# Patient Record
Sex: Female | Born: 1940 | Race: Black or African American | Hispanic: No | Smoking: Never smoker
Health system: Southern US, Community
[De-identification: ages and names within clinical notes are randomized; demographics above are authoritative.]

## PROBLEM LIST (undated history)

## (undated) DIAGNOSIS — M109 Gout, unspecified: Secondary | ICD-10-CM

## (undated) DIAGNOSIS — I4891 Unspecified atrial fibrillation: Secondary | ICD-10-CM

## (undated) DIAGNOSIS — E785 Hyperlipidemia, unspecified: Secondary | ICD-10-CM

## (undated) DIAGNOSIS — T4145XA Adverse effect of unspecified anesthetic, initial encounter: Secondary | ICD-10-CM

## (undated) DIAGNOSIS — N390 Urinary tract infection, site not specified: Secondary | ICD-10-CM

## (undated) DIAGNOSIS — R569 Unspecified convulsions: Secondary | ICD-10-CM

## (undated) DIAGNOSIS — R7302 Impaired glucose tolerance (oral): Secondary | ICD-10-CM

## (undated) DIAGNOSIS — K219 Gastro-esophageal reflux disease without esophagitis: Secondary | ICD-10-CM

## (undated) DIAGNOSIS — T8859XA Other complications of anesthesia, initial encounter: Secondary | ICD-10-CM

## (undated) DIAGNOSIS — M1990 Unspecified osteoarthritis, unspecified site: Secondary | ICD-10-CM

## (undated) DIAGNOSIS — F0390 Unspecified dementia without behavioral disturbance: Secondary | ICD-10-CM

## (undated) DIAGNOSIS — N824 Other female intestinal-genital tract fistulae: Secondary | ICD-10-CM

## (undated) DIAGNOSIS — I639 Cerebral infarction, unspecified: Secondary | ICD-10-CM

## (undated) HISTORY — PX: CHOLECYSTECTOMY: SHX55

## (undated) HISTORY — PX: BUNIONECTOMY: SHX129

---

## 1996-11-19 HISTORY — PX: OTHER SURGICAL HISTORY: SHX169

## 2005-11-19 HISTORY — PX: OTHER SURGICAL HISTORY: SHX169

## 2006-01-15 ENCOUNTER — Emergency Department: Payer: Self-pay | Admitting: Emergency Medicine

## 2006-07-13 ENCOUNTER — Emergency Department: Payer: Self-pay | Admitting: Emergency Medicine

## 2006-11-02 ENCOUNTER — Observation Stay: Payer: Self-pay | Admitting: Specialist

## 2006-11-15 ENCOUNTER — Encounter: Payer: Self-pay | Admitting: Internal Medicine

## 2006-11-19 ENCOUNTER — Encounter: Payer: Self-pay | Admitting: Internal Medicine

## 2006-12-20 ENCOUNTER — Encounter: Payer: Self-pay | Admitting: Internal Medicine

## 2007-01-18 ENCOUNTER — Encounter: Payer: Self-pay | Admitting: Internal Medicine

## 2009-02-15 ENCOUNTER — Encounter: Payer: Self-pay | Admitting: Physician Assistant

## 2009-02-17 ENCOUNTER — Encounter: Payer: Self-pay | Admitting: Physician Assistant

## 2009-03-19 ENCOUNTER — Encounter: Payer: Self-pay | Admitting: Physician Assistant

## 2009-04-19 ENCOUNTER — Encounter: Payer: Self-pay | Admitting: Physician Assistant

## 2009-05-10 ENCOUNTER — Observation Stay (HOSPITAL_COMMUNITY): Admission: EM | Admit: 2009-05-10 | Discharge: 2009-05-11 | Payer: Self-pay | Admitting: Emergency Medicine

## 2009-05-10 ENCOUNTER — Ambulatory Visit: Payer: Self-pay | Admitting: Family Medicine

## 2011-02-26 LAB — URINALYSIS, ROUTINE W REFLEX MICROSCOPIC
Bilirubin Urine: NEGATIVE
Hgb urine dipstick: NEGATIVE
Specific Gravity, Urine: 1.022 (ref 1.005–1.030)
Urobilinogen, UA: 1 mg/dL (ref 0.0–1.0)
pH: 6.5 (ref 5.0–8.0)

## 2011-02-26 LAB — DIFFERENTIAL
Basophils Absolute: 0 10*3/uL (ref 0.0–0.1)
Eosinophils Relative: 2 % (ref 0–5)
Lymphocytes Relative: 40 % (ref 12–46)
Monocytes Absolute: 0.4 10*3/uL (ref 0.1–1.0)

## 2011-02-26 LAB — LIPID PANEL
Total CHOL/HDL Ratio: 2.5 RATIO
Triglycerides: 57 mg/dL (ref <150)
VLDL: 11 mg/dL (ref 0–40)

## 2011-02-26 LAB — URINE MICROSCOPIC-ADD ON

## 2011-02-26 LAB — CBC
HCT: 43.1 % (ref 36.0–46.0)
HCT: 46.9 % — ABNORMAL HIGH (ref 36.0–46.0)
Hemoglobin: 14.1 g/dL (ref 12.0–15.0)
Hemoglobin: 15.5 g/dL — ABNORMAL HIGH (ref 12.0–15.0)
MCHC: 33 g/dL (ref 30.0–36.0)
MCV: 92.6 fL (ref 78.0–100.0)
RBC: 4.67 MIL/uL (ref 3.87–5.11)
RDW: 13.4 % (ref 11.5–15.5)
RDW: 13.7 % (ref 11.5–15.5)

## 2011-02-26 LAB — POCT I-STAT, CHEM 8
Creatinine, Ser: 0.9 mg/dL (ref 0.4–1.2)
Glucose, Bld: 122 mg/dL — ABNORMAL HIGH (ref 70–99)
Hemoglobin: 16.3 g/dL — ABNORMAL HIGH (ref 12.0–15.0)
Potassium: 3.9 mEq/L (ref 3.5–5.1)

## 2011-02-26 LAB — POCT CARDIAC MARKERS
CKMB, poc: <1 ng/mL — ABNORMAL LOW (ref 1.0–8.0)
Troponin i, poc: <0.05 ng/mL (ref 0.00–0.09)

## 2011-02-26 LAB — COMPREHENSIVE METABOLIC PANEL
Albumin: 3.3 g/dL — ABNORMAL LOW (ref 3.5–5.2)
BUN: 14 mg/dL (ref 6–23)
Creatinine, Ser: 0.85 mg/dL (ref 0.4–1.2)
Total Protein: 7.2 g/dL (ref 6.0–8.3)

## 2011-02-26 LAB — BASIC METABOLIC PANEL
CO2: 25 mEq/L (ref 19–32)
GFR calc Af Amer: >60 mL/min (ref >60)
GFR calc non Af Amer: >60 mL/min (ref >60)
Glucose, Bld: 97 mg/dL (ref 70–99)
Potassium: 3.5 mEq/L (ref 3.5–5.1)
Sodium: 138 mEq/L (ref 135–145)

## 2011-02-26 LAB — CK TOTAL AND CKMB (NOT AT ARMC)
CK, MB: 1.3 ng/mL (ref 0.3–4.0)
Relative Index: INVALID (ref 0.0–2.5)
Total CK: 69 U/L (ref 7–177)

## 2011-02-26 LAB — PROTIME-INR: Prothrombin Time: 23.2 seconds — ABNORMAL HIGH (ref 11.6–15.2)

## 2011-02-26 LAB — URINE CULTURE

## 2011-04-03 NOTE — H&P (Signed)
NAMEVALLEY, KE NO.:  [id number]   MEDICAL RECORD NO.:  [id number]          PATIENT TYPE:  OBV   LOCATION:  3703                         FACILITY:  [facility]   PHYSICIAN:  Paula Compton, MD        DATE OF BIRTH:  06-30-1941   DATE OF ADMISSION:  05/10/2009  DATE OF DISCHARGE:                              HISTORY & PHYSICAL   PRIMARY CARE PHYSICIAN:  Dr. Tracey Harries at [facility].   PRIMARY CARDIOLOGIST:  Dr. Ardyth Harps at [facility].   CHIEF COMPLAINT:  Altered mental status.   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  She lives with her son in [place] who  last saw the patient last evening approximately midnight and she was at  her baseline.  He went to wake her this morning and found her in her bed  on her back, the sheets were disheveled, and she had bright red blood on  her left lip.  She was stunned appearing.  She is moving all  extremities but would make eye contact or follow commands.  He called  911, and by the time she arrived at the hospital via the ambulance the  son reports she was back to her normal neurologic baseline.  She was  incontinent of urine at the time when he found her.  Currently, she does  not remember the event and is without complaints.  The last thing she  remembers is waking up at the hospital.  She was on aspirin 325 mg for  her AFib at the time of her stroke in January but was then changed to  Coumadin and aspirin.  She has been staying with her sisters over the  past few days while her son was out of town at a funeral and had a  fairly liberal diet with a subsequent INR today of 1.8.  She denies  chest pain, shortness of breath, altered mental status now.  The  son is  very attentive to her and very helpful with her medications and diet.  Her cardiologist whom she sees routinely is Dr. Ardyth Harps at [facility] and  her last visit was May 2010 and all was well per the son.  She is able  to walk at baseline with a cane and do all of her ADLs including driving  although she has not returned to that since her stroke in January per  the son.  She passed a swallow study in the emergency department without  problems.   REVIEW OF SYSTEMS:  Currently as above.  She has no altered mental  status or confusion.  No chest pain.  No shortness of breath, dizziness,  or pain.  She denies any changes in her bowels and no dysuria.  The only  thing she endorses is some very mild fatigue  today.   PAST MEDICAL HISTORY:  1. CVA in January 2010 with very mild residual left-sided weakness and      left facial droop but able to do all of her ADLs and walk.  2. Atrial fibrillation, currently on Coumadin, aspirin, and a beta-      blocker.  3. Depression.  4. Hyperlipidemia.  5. Gastroesophageal reflux disease.  6. Chronic leg edema.  7. Obesity.  8. Questionable gout, although the son does not think she has this.  9. 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   PAST SURGICAL HISTORY:  1. Right hip replacement in 1999 and then revised in 2007 after a      right femur fracture.  2. Cholecystectomy.  3. Hernia repair.   ALLERGIES:  No known drug allergies.   MEDICATIONS:  1. Coumadin 4 mg Monday, Wednesday, and Friday, and 3 mg other days.  2. Metoprolol tartrate 50 mg p.o. daily.  3. Colchicine 0.6 mg p.o. daily.  4. KCl 10 mEq p.o. daily  5. Lasix 20 mg p.o. daily.  6. Sertraline 25 mg p.o. daily.  7. Simvastatin 40 mg p.o. daily.  8. Nexium 40 mg p.o. daily.  9. Calcium citrate daily  10.Aspirin 81 mg p.o. daily.   SOCIAL HISTORY:  She lives in [place] with her son who is very  attentive.  She used to work in mills but retired with disability due to  her  chronic osteoarthritis versus rheumatoid arthritis.  She denies  alcohol, tobacco, or drugs.   FAMILY HISTORY:  Mother had seizures after her CVA, but no other  seizures disorders in the family and no other major cancers.   PHYSICAL EXAMINATION:  VITAL SIGNS:  Temperature 98.1, heart rate 89-93  and irregular, blood pressure 131/84, respirations 19-20.  She is 97-99%  on 2 L nasal cannula.  GENERAL:  She is alert and oriented x3, pleasant, follows commands, no  apparent distress, morbidly obese.  HEENT:  Atraumatic, normocephalic except for a swollen right lower lip  and right-sided tongue bruise with a very faint left-sided droop and  otherwise face is symmetric.  NECK:  Supple.  Full range of motion.  No bruits.  PULMONARY:  Clear to auscultation bilaterally.  No wheezes, rhonchi, or  crackles.  No work of breathing.  CARDIOVASCULAR:  Irregularly irregular with no murmur.  ABDOMEN:  Obese, positive bowel sounds, soft, nontender, nondistended  EXTREMITIES:  No edema.  GU:  Foley is currently in place.  NEUROLOGIC:  Alert and oriented x3.  Cranial nerves II through XII  intact except for very mild left facial droop, strength is normal to me  in all of her extremities and equal bilaterally.  She has 2+ reflexes in  brachioradialis and patellofemoral and her Achilles,  SKIN:  No decubitus ulcers or rashes.   LABORATORY EXAMS AND IMAGING:  Hemoglobin 15.5, hematocrit 46.9,  platelet 185.  White blood cells 5.2.  BMET is within normal limits  including creatinine of 0.9, INR is 1.8, PT is 21.8.  Point-of-care  enzymes are negative x1.  Urinalysis is negative except for 100 of  protein with a specific gravity of 1.022.  Imaging shows a chest x-ray  with cardiomegaly and pulmonary vascular congestion but no frank edema  and a very small left pleural effusion and basilar atelectasis.  Head CT  shows remote right MCV infarct and no acute findings.   ASSESSMENT AND PLAN:  A 70 year old  obese African American female with  known atrial fibrillation and mildly  subtherapeutic INR at 1.8, history  of cerebrovascular accident, and hyperlipidemia, now status post altered  mental status has resolved.  1. Altered mental status.  Differential diagnoses includes seizure,      transient ischemic attack, and urinary tract infection.  Seizure is      likely given the history but cannot know for sure.  It could be a      transient ischemic attack given the decreased INR and her history      of atrial fibrillation.  Urinalysis is clean, but we will send it      for culture.  We will admit her for observations on telemetry floor      with neuro checks and seizure precautions.  She passed a swallow      study in the emergency room and her head CT was negative and the      son is declining any PT evaluation as he claims she is at baseline      and has had plenty of PT.  No further imaging needed at this time      unless neuro exam changes.  We will get her INR to therapeutic,      check a CMET and a fasting lipid panel.  No further workup at this      time as she has likely had a large workup at [facility] and does not      currently need to be repeated assuming her neuro status today is at      baseline.  Assuming no changes here, we will have her followup with      her PCP within the week for further management.  2. Atrial fibrillation.  This is rate controlled on Toprol.  Continue      her on Coumadin as above.  3. Hyperlipidemia.  Fasting lipid panel and continue simvastatin.  4. Depression.  Continue sertraline.  This is a very small dose, and      we will defer to the PCP for titration.  5. Questionable gout.  Son would like to discontinue the colchicine      and I think this is appropriate, so we will discontinue that this      hospitalization.  6. Chronic edema.  Continue her Lasix and KCL.  Likely, she has had an      echo in the past.  The son does not know her ejection fraction at       the time.  We will defer this to her cardiologist at [facility] for      further workup.  Has no pulmonary issues or edema currently.  7. Fluids, electrolytes, nutrition/gastrointestinal.  She passed her      swallow study in the emergency department, however, has evaluated      without port per the son's request and Hep-Lock her IV.  8. Prophylaxis on Coumadin and PPI.  9. Disposition pending 24 hours without incident.      Lupita Raider, M.D.  Electronically Signed      Paula Compton, MD  Electronically Signed    KS/MEDQ  D:  05/10/2009  T:  05/11/2009  Job:  [id number]

## 2011-04-06 NOTE — Discharge Summary (Signed)
[name], [name] NO.:  [id number]   MEDICAL RECORD NO.:  [id number]          PATIENT TYPE:  OBV   LOCATION:  3703                         FACILITY:  [facility]   PHYSICIAN:  Paula Compton, MD        DATE OF BIRTH:  02-Apr-1941   DATE OF ADMISSION:  05/10/2009  DATE OF DISCHARGE:  05/11/2009                               DISCHARGE SUMMARY   PRIMARY CARE Adilson Grafton:  Dr. Tracey Harries at [facility].   PRIMARY CARDIOLOGIST:  Dr. Ardyth Harps at [facility].   DISCHARGE DIAGNOSES:  1. Altered mental status, resolved, likely secondary to seizure.  2. Cerebrovascular accident in January 2010.  3. Atrial fibrillation, currently on Coumadin, aspirin, and beta-      blocker.  4. Depression.  5. Hyperlipidemia.  6. Chronic edema.  7. Obesity.  8. Gastroesophageal reflux disease.  9. 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   DISCHARGE MEDICATIONS:  1. Metoprolol 50 mg by mouth daily.  2. K-Dur 10 mEq by mouth daily.  3. Lasix 20 mg by mouth daily.  4. Sertraline 12.5 mg by mouth daily.  5. Simvastatin 40 mg each night.  6. Nexium 40 mg by mouth daily.  7. Aspirin 81 mg by mouth daily.  8. Calcium citrate daily.   The patient was also instructed to consider continue her Coumadin per  prior instructions, which were 4 mg Monday, Wednesday, and Friday and 3  mg on all other days.   IMAGING DATA:  1. Chest x-ray on May 10, 2009:  Impression; cardiomegaly and      pulmonary vascular congestion with a small left pleural effusion      and basilar atelectasis.  2. CT of the head on May 10, 2009:  Impression; remote right MCA      infarct.  No acute intracranial findings.   LABORATORY DATA:  Urine culture on May 10, 2009, showed over 100,000  colonies of E. Coli.  Lipid profile:  Cholesterol 108, triglyceride 57,  HDL 43, LDL 54.  BMP within normal limits.  CBC within normal limits.  INR 1.9.  Cardiac enzymes negative x2.   BRIEF HOSPITAL COURSE:  [name] is a  70 year old female with a known  history of atrial fibrillation and subtherapeutic INR as well as a  recent history of CVA that was admitted for altered mental status that  resolved.  This was found to most likely be secondary to seizure  activity versus TIA.   1. Altered mental status.  Differential included seizure, TIA, and      UTI.  The patient was admitted for observation, her altered mental      status did resolve overnight.  She has had no further seizure      activity while hospitalized.  Her lipid panel, CBC, and BMP were      within normal limits.  Her INR upon discharge was 0.9.  Her CT of      the head, as above with no acute intracranial findings.  The      patient was treated with Rocephin while hospitalized for her  urinary tract infection.  Because, the patient had urinanalysis      that was negative with the exception of many bacteria and her urine      culture was not ready prior to discharge, she was not discharged on      a medication for UTI.  2. AFib.  The patient's medications Lopressor, Coumadin, and aspirin      were continued throughout her stay.  She remained in AFib.  She was      discharged with an INR of 1.9.  3. Hyperlipidemia.  The patient's lipid panel as above.  She can      continue to take her Zocor.  4. Depression.  We will continue the patient's sertraline throughout      her stay.  5. Chronic edema.  We will the continue the patient's Lasix and K-Dur.      She does saw a cardiologist at [facility]   FOLLOWUP:  The patient was instructed to follow up with her primary care  physician in 1-2 weeks as well as keep any followup appointments with  Dr. Ardyth Harps at [facility], her cardiologist.   Elenor Quinones ISSUES:  1. Now that her urine culture has grown over 100,000 E. Coli, this may      need further antibiotics.  2. Follow up for any further seizure activity.  3. Follow up Coumadin with INR goal of 2-3.      Helane Rima, MD  Electronically  Signed      Paula Compton, MD  Electronically Signed    EW/MEDQ  D:  05/15/2009  T:  05/16/2009  Job:  [id number]

## 2011-08-08 DIAGNOSIS — Z7901 Long term (current) use of anticoagulants: Secondary | ICD-10-CM | POA: Insufficient documentation

## 2011-11-07 ENCOUNTER — Other Ambulatory Visit: Payer: Self-pay

## 2011-11-07 ENCOUNTER — Encounter: Payer: Self-pay | Admitting: Emergency Medicine

## 2011-11-07 ENCOUNTER — Emergency Department (HOSPITAL_COMMUNITY)
Admission: EM | Admit: 2011-11-07 | Discharge: 2011-11-08 | Discharge status: Against Medical Advice | Payer: Medicare Other | Attending: Emergency Medicine | Admitting: Emergency Medicine

## 2011-11-07 DIAGNOSIS — R1013 Epigastric pain: Secondary | ICD-10-CM | POA: Insufficient documentation

## 2011-11-07 DIAGNOSIS — R569 Unspecified convulsions: Secondary | ICD-10-CM | POA: Insufficient documentation

## 2011-11-07 DIAGNOSIS — R079 Chest pain, unspecified: Secondary | ICD-10-CM | POA: Insufficient documentation

## 2011-11-07 DIAGNOSIS — Z8679 Personal history of other diseases of the circulatory system: Secondary | ICD-10-CM | POA: Insufficient documentation

## 2011-11-07 DIAGNOSIS — I4891 Unspecified atrial fibrillation: Secondary | ICD-10-CM | POA: Insufficient documentation

## 2011-11-07 HISTORY — DX: Unspecified atrial fibrillation: I48.91

## 2011-11-07 HISTORY — DX: Unspecified osteoarthritis, unspecified site: M19.90

## 2011-11-07 HISTORY — DX: Cerebral infarction, unspecified: I63.9

## 2011-11-07 LAB — CBC
MCH: 31.6 pg (ref 26.0–34.0)
MCHC: 34.6 g/dL (ref 30.0–36.0)
MCV: 91.3 fL (ref 78.0–100.0)
Platelets: 163 10*3/uL (ref 150–400)
RDW: 11.9 % (ref 11.5–15.5)

## 2011-11-07 LAB — BASIC METABOLIC PANEL
BUN: 12 mg/dL (ref 6–23)
CO2: 27 mEq/L (ref 19–32)
Calcium: 9.4 mg/dL (ref 8.4–10.5)
Chloride: 105 mEq/L (ref 96–112)
Creatinine, Ser: 0.88 mg/dL (ref 0.50–1.10)
GFR calc Af Amer: 74 mL/min — ABNORMAL LOW (ref >90)
GFR calc non Af Amer: 64 mL/min — ABNORMAL LOW (ref >90)
Glucose, Bld: 90 mg/dL (ref 70–99)
Potassium: 3.9 mEq/L (ref 3.5–5.1)
Sodium: 139 mEq/L (ref 135–145)

## 2011-11-07 LAB — POCT I-STAT TROPONIN I

## 2011-11-07 NOTE — ED Provider Notes (Signed)
History     CSN: [id number] Arrival date & time: 11/07/2011  7:02 PM   First MD Initiated Contact with Patient 11/07/11 2212      Chief Complaint  Patient presents with  . Chest Pain    (Consider location/radiation/quality/duration/timing/severity/associated sxs/prior treatment) Patient is a 70 y.o. female presenting with chest pain. The history is provided by the patient and a relative.  Chest Pain The chest pain began 6 - 12 hours ago. Duration of episode(s) is 30 minutes. Chest pain occurs intermittently. The chest pain is resolved. Associated with: unknown. The pain is currently at 0/10. The severity of the pain is moderate. The quality of the pain is described as burning. The pain does not radiate. Primary symptoms include abdominal pain (epigastrum). Pertinent negatives for primary symptoms include no fever, no fatigue, no shortness of breath, no cough, no wheezing, no palpitations, no nausea, no vomiting and no altered mental status.  Pertinent negatives for associated symptoms include no near-syncope and no weakness. She tried nothing for the symptoms. Risk factors include being elderly.  Pertinent negatives for past medical history include no CAD.  Pertinent negatives for family medical history include: no CAD in family.     Past Medical History  Diagnosis Date  . Arthritis   . Atrial fibrillation   . CVA (cerebral infarction)   . Seizures     Past Surgical History  Procedure Date  . Total hip arthroplasty     No family history on file.  History  Substance Use Topics  . Smoking status: Never Smoker   . Smokeless tobacco: Not on file  . Alcohol Use: No    OB History    Grav Para Term Preterm Abortions TAB SAB Ect Mult Living                  Review of Systems  Constitutional: Negative for fever, chills and fatigue.  Respiratory: Negative for cough, shortness of breath and wheezing.   Cardiovascular: Positive for chest pain. Negative for palpitations and  near-syncope.  Gastrointestinal: Positive for abdominal pain (epigastrum). Negative for nausea, vomiting, diarrhea and constipation.  Musculoskeletal: Negative for back pain.  Skin: Negative for color change and rash.  Neurological: Negative for weakness, light-headedness and headaches.  Psychiatric/Behavioral: Negative for confusion and altered mental status.  All other systems reviewed and are negative.    Allergies  Review of patient's allergies indicates no known allergies.  Home Medications   Current Outpatient Rx  Name Route Sig Dispense Refill  . ASPIRIN EC 81 MG PO TBEC Oral Take 81 mg by mouth daily.      Marland Kitchen CALCIUM CARBONATE ANTACID 1000 MG PO CHEW Oral Chew 1,000 mg by mouth 2 (two) times daily.      Marland Kitchen ESOMEPRAZOLE MAGNESIUM 40 MG PO CPDR Oral Take 40 mg by mouth daily before breakfast.      . FUROSEMIDE 20 MG PO TABS Oral Take 20 mg by mouth daily.      Marland Kitchen LEVETIRACETAM 500 MG PO TABS Oral Take 500 mg by mouth 2 (two) times daily.      Marland Kitchen METOPROLOL TARTRATE 50 MG PO TABS Oral Take 50 mg by mouth daily.      Marland Kitchen NIACIN 500 MG PO TABS Oral Take 500 mg by mouth daily with breakfast.      . POTASSIUM CHLORIDE 10 MEQ PO TBCR Oral Take 10 mEq by mouth daily.      Marland Kitchen SIMVASTATIN 40 MG PO TABS Oral Take 40  mg by mouth at bedtime.      . WARFARIN SODIUM 3 MG PO TABS Oral Take 3 mg by mouth daily. Take on Mondays, Wednesdays, and Fridays.     . WARFARIN SODIUM 4 MG PO TABS Oral Take 4 mg by mouth daily. Take on Tuesdays, Thursdays, Saturdays, and Sundays.       BP 120/70  Pulse 78  Temp(Src) 98.2 F (36.8 C) (Oral)  Resp 16  SpO2 94%  Physical Exam  Nursing note and vitals reviewed. Constitutional: She is oriented to person, place, and time. She appears well-developed and well-nourished.  HENT:  Head: Normocephalic and atraumatic.  Eyes: Pupils are equal, round, and reactive to light.  Cardiovascular: Normal rate, regular rhythm, normal heart sounds and intact distal pulses.     Pulmonary/Chest: Effort normal and breath sounds normal. No respiratory distress. She exhibits no tenderness.  Abdominal: Soft. She exhibits no distension. There is no tenderness.  Neurological: She is alert and oriented to person, place, and time.  Skin: Skin is warm and dry.  Psychiatric: She has a normal mood and affect.    ED Course  Procedures (including critical care time)  Labs Reviewed  CBC - Abnormal; Notable for the following:    Hemoglobin 16.0 (*)    HCT 46.3 (*)    All other components within normal limits  BASIC METABOLIC PANEL - Abnormal; Notable for the following:    GFR calc non Af Amer 64 (*)    GFR calc Af Amer 74 (*)    All other components within normal limits  POCT I-STAT TROPONIN I  I-STAT TROPONIN I   No results found.   1. Chest pain       MDM  A 70 year old [place] American female who presents to the ED with an episode of chest pain. She had an episode earlier today, and was seen by her PCP, at that point in time was diagnosed with indigestion. Patient does state this is similar to previous episodes of indigestion that she has had in the past, though is more severe than normal. The pain went away after several minutes,: Several hours. The pain did recur again this evening, and lasted for longer period of time, prompting her son to bring her into the emergency department. At this time she is currently pain-free, and has no complaints. There were no other associated symptoms with this episode of pain. The patient is pointing to the upper epigastrum/lower chest region as the area where her pain is located. Her exam was unremarkable. Labs were obtained in triage, which are unremarkable. She does not have an elevated troponin at this time. Discussed with the patient and family of the low suspicion for cardiac etiology given it being similar to her previous episodes of indigestion, however due to her age, and being a female, she is at a greater risk for having an  atypical presentation for ACS. Discussed repeat troponin as well as cardiac rhythm, and family has decided against staying overnight for rule out due to desire to return to home. They are amenable to a second troponin, and will followup with PCP in the next several days. However before the second troponin could be drawn, the patient and her son were noted to no longer be in the room. They were unable to be found in the emergency department, nor in triage, and the patient's belongings were gone as well. She does seem to have eloped from the emergency department.  Theotis Burrow, MD 11/08/11 [phone]

## 2011-11-07 NOTE — ED Notes (Signed)
Pt's son st's pt had some upper abd and chest pain onset this am.  Was seen by her MD at [facility] and dx with indigestion.  Son st's pt did not have any pain while she was at [facility] after returning home pain returned, once she got to ED pain has subsided and has not returned.

## 2011-11-07 NOTE — ED Notes (Signed)
Pt and son are not present, no belongings at bedside.

## 2011-11-08 NOTE — ED Notes (Signed)
Pt and son still not present at stretcher ERMD aware, pt to be discharged ama

## 2011-11-08 NOTE — ED Provider Notes (Signed)
reviewed the resident's note and I agree with the findings and plan. Patient eloped before I was able to examine her.    Juliet Rude. Rubin Payor, MD 11/08/11 [phone]

## 2014-01-08 ENCOUNTER — Emergency Department (HOSPITAL_COMMUNITY)
Admission: EM | Admit: 2014-01-08 | Discharge: 2014-01-09 | Disposition: A | Discharge status: Home / Self Care | Payer: Medicare Other | Attending: Emergency Medicine | Admitting: Emergency Medicine

## 2014-01-08 ENCOUNTER — Encounter (HOSPITAL_COMMUNITY): Payer: Self-pay | Admitting: Emergency Medicine

## 2014-01-08 DIAGNOSIS — S01309A Unspecified open wound of unspecified ear, initial encounter: Secondary | ICD-10-CM | POA: Insufficient documentation

## 2014-01-08 DIAGNOSIS — Z8673 Personal history of transient ischemic attack (TIA), and cerebral infarction without residual deficits: Secondary | ICD-10-CM | POA: Insufficient documentation

## 2014-01-08 DIAGNOSIS — Y929 Unspecified place or not applicable: Secondary | ICD-10-CM | POA: Insufficient documentation

## 2014-01-08 DIAGNOSIS — Z7901 Long term (current) use of anticoagulants: Secondary | ICD-10-CM | POA: Insufficient documentation

## 2014-01-08 DIAGNOSIS — I4891 Unspecified atrial fibrillation: Secondary | ICD-10-CM | POA: Insufficient documentation

## 2014-01-08 DIAGNOSIS — G40909 Epilepsy, unspecified, not intractable, without status epilepticus: Secondary | ICD-10-CM | POA: Insufficient documentation

## 2014-01-08 DIAGNOSIS — Y939 Activity, unspecified: Secondary | ICD-10-CM | POA: Insufficient documentation

## 2014-01-08 DIAGNOSIS — M129 Arthropathy, unspecified: Secondary | ICD-10-CM | POA: Insufficient documentation

## 2014-01-08 DIAGNOSIS — X58XXXA Exposure to other specified factors, initial encounter: Secondary | ICD-10-CM | POA: Insufficient documentation

## 2014-01-08 DIAGNOSIS — H9210 Otorrhea, unspecified ear: Secondary | ICD-10-CM | POA: Insufficient documentation

## 2014-01-08 DIAGNOSIS — Z7982 Long term (current) use of aspirin: Secondary | ICD-10-CM | POA: Insufficient documentation

## 2014-01-08 DIAGNOSIS — Z79899 Other long term (current) drug therapy: Secondary | ICD-10-CM | POA: Insufficient documentation

## 2014-01-08 DIAGNOSIS — H9221 Otorrhagia, right ear: Secondary | ICD-10-CM

## 2014-01-08 LAB — CBC WITH DIFFERENTIAL/PLATELET
BASOS PCT: 0 % (ref 0–1)
Basophils Absolute: 0 10*3/uL (ref 0.0–0.1)
EOS ABS: 0.1 10*3/uL (ref 0.0–0.7)
Eosinophils Relative: 1 % (ref 0–5)
HCT: 47.6 % — ABNORMAL HIGH (ref 36.0–46.0)
HEMOGLOBIN: 16.5 g/dL — AB (ref 12.0–15.0)
Lymphocytes Relative: 37 % (ref 12–46)
Lymphs Abs: 2.4 10*3/uL (ref 0.7–4.0)
MCH: 31.9 pg (ref 26.0–34.0)
MCHC: 34.7 g/dL (ref 30.0–36.0)
MCV: 91.9 fL (ref 78.0–100.0)
MONOS PCT: 7 % (ref 3–12)
Monocytes Absolute: 0.4 10*3/uL (ref 0.1–1.0)
NEUTROS PCT: 55 % (ref 43–77)
Neutro Abs: 3.5 10*3/uL (ref 1.7–7.7)
Platelets: 236 10*3/uL (ref 150–400)
RBC: 5.18 MIL/uL — ABNORMAL HIGH (ref 3.87–5.11)
RDW: 12.3 % (ref 11.5–15.5)
WBC: 6.4 10*3/uL (ref 4.0–10.5)

## 2014-01-08 LAB — COMPREHENSIVE METABOLIC PANEL
ALBUMIN: 3.6 g/dL (ref 3.5–5.2)
ALK PHOS: 78 U/L (ref 39–117)
ALT: 15 U/L (ref 0–35)
AST: 20 U/L (ref 0–37)
BUN: 14 mg/dL (ref 6–23)
CALCIUM: 9.5 mg/dL (ref 8.4–10.5)
CO2: 27 mEq/L (ref 19–32)
CREATININE: 0.92 mg/dL (ref 0.50–1.10)
Chloride: 104 mEq/L (ref 96–112)
GFR calc Af Amer: 69 mL/min — ABNORMAL LOW (ref >90)
GFR calc non Af Amer: 60 mL/min — ABNORMAL LOW (ref >90)
Glucose, Bld: 134 mg/dL — ABNORMAL HIGH (ref 70–99)
POTASSIUM: 4 meq/L (ref 3.7–5.3)
Sodium: 143 mEq/L (ref 137–147)
TOTAL PROTEIN: 8 g/dL (ref 6.0–8.3)
Total Bilirubin: 1.2 mg/dL (ref 0.3–1.2)

## 2014-01-08 LAB — PROTIME-INR
INR: 1.67 — AB (ref 0.00–1.49)
PROTHROMBIN TIME: 19.2 s — AB (ref 11.6–15.2)

## 2014-01-08 MED ORDER — CEPHALEXIN 500 MG PO CAPS: 500.0000 mg | ORAL_CAPSULE | Freq: Four times a day (QID) | ORAL | Status: DC

## 2014-01-08 NOTE — ED Notes (Signed)
Pt in with son stating that today while at her PCP office patient had her right ear cleaned out, around noon today they noted blood coming from right ear, there has been a continuous flow of blood from ear since that time, pt does take blood thinners, advised to come to ED for further evaluation

## 2014-01-08 NOTE — ED Notes (Signed)
Son reports that patient takes Pradaxa because she had a stroke 4-5 years ago.

## 2014-01-08 NOTE — Discharge Instructions (Signed)
Please take the Keflex to prevent infection. Leave the cotton ball in her ear to apply pressure to stop bleeding. If the cotton ball falls out and she is bleeding but the cotton ball back in if you can.  You can see our ENT doctor if you would like for follow-up.  For any concerns please return to the ED

## 2014-01-08 NOTE — ED Notes (Signed)
Tiffany, PA-C at the bedside to see the patient.

## 2014-01-08 NOTE — ED Provider Notes (Signed)
CSN: [id number]     Arrival date & time 01/08/14  2119 History   First MD Initiated Contact with Patient 01/08/14 2212     Chief Complaint  Patient presents with  . Bleeding from ear      (Consider location/radiation/quality/duration/timing/severity/associated sxs/prior Treatment) HPI  Patient with PMH of seizures, A.fib- on Prodaxa, CVA and seizures presents to the ER with complaints of bleeding ear. Her son accompanies her and gives the history. She was seen this morning at a PCP office and had her ears cleared out. Since the incident her ear has been bleeding. She is on blood thinners and therefore the son became concerned once the bleeding would not stop. She has no pain and is no acute distress at this time.   Past Medical History  Diagnosis Date  . Arthritis   . Atrial fibrillation   . CVA (cerebral infarction)   . Seizures    Past Surgical History  Procedure Laterality Date  . Total hip arthroplasty     History reviewed. No pertinent family history. History  Substance Use Topics  . Smoking status: Never Smoker   . Smokeless tobacco: Not on file  . Alcohol Use: No   OB History   Grav Para Term Preterm Abortions TAB SAB Ect Mult Living                 Review of Systems  The patient denies anorexia, fever, weight loss,, vision loss, decreased hearing, hoarseness, chest pain, syncope, dyspnea on exertion, peripheral edema, balance deficits, hemoptysis, abdominal pain, melena, hematochezia, severe indigestion/heartburn, hematuria, incontinence, genital sores, muscle weakness, suspicious skin lesions, transient blindness, difficulty walking, depression, unusual weight change,  enlarged lymph nodes, angioedema, and breast masses.   Allergies  Review of patient's allergies indicates no known allergies.  Home Medications   Current Outpatient Rx  Name  Route  Sig  Dispense  Refill  . acetaminophen (TYLENOL) 325 MG tablet   Oral   Take 325 mg by mouth every 6 (six)  hours as needed for moderate pain.         Marland Kitchen aspirin EC 81 MG tablet   Oral   Take 81 mg by mouth daily.           . calcium elemental as carbonate (BARIATRIC TUMS ULTRA) 400 MG tablet   Oral   Chew 1,000 mg by mouth 2 (two) times daily.           . Cholecalciferol 1000 UNITS capsule   Oral   Take 1,000 Units by mouth daily.         . dabigatran (PRADAXA) 150 MG CAPS capsule   Oral   Take 150 mg by mouth 2 (two) times daily.         Marland Kitchen esomeprazole (NEXIUM) 40 MG capsule   Oral   Take 40 mg by mouth daily before breakfast.           . furosemide (LASIX) 20 MG tablet   Oral   Take 20 mg by mouth daily.           Marland Kitchen ketotifen (ZADITOR) 0.025 % ophthalmic solution   Both Eyes   Place 1 drop into both eyes daily as needed (for dry eyes).         Marland Kitchen levETIRAcetam (KEPPRA) 500 MG tablet   Oral   Take 500 mg by mouth 2 (two) times daily.           . metoprolol (LOPRESSOR) 50 MG tablet  Oral   Take 50 mg by mouth daily.           . niacin 500 MG tablet   Oral   Take 500 mg by mouth daily with breakfast.           . potassium chloride (KLOR-CON) 10 MEQ CR tablet   Oral   Take 10 mEq by mouth daily.           . simvastatin (ZOCOR) 40 MG tablet   Oral   Take 40 mg by mouth at bedtime.           . cephALEXin (KEFLEX) 500 MG capsule   Oral   Take 1 capsule (500 mg total) by mouth 4 (four) times daily.   40 capsule   0    BP 132/88  Pulse 92  Temp(Src) 97.2 F (36.2 C) (Oral)  Resp 18  Wt 353 lb (160.12 kg)  SpO2 93% Physical Exam  Nursing note and vitals reviewed. Constitutional: She appears well-developed and well-nourished. No distress.  HENT:  Head: Normocephalic and atraumatic.  Right Ear: Tympanic membrane and ear canal normal.  Patients right ear canal shows two small lacerations at 5 o clock and 7 o clock at the entrance of the ear canal. The TM is obstructed by blood.  Eyes: Pupils are equal, round, and reactive to light.   Neck: Normal range of motion. Neck supple.  Cardiovascular: Normal rate and regular rhythm.   Pulmonary/Chest: Effort normal.  Abdominal: Soft.  Neurological: She is alert.  Skin: Skin is warm and dry.    ED Course  Procedures (including critical care time) Labs Review Labs Reviewed  CBC WITH DIFFERENTIAL - Abnormal; Notable for the following:    RBC 5.18 (*)    Hemoglobin 16.5 (*)    HCT 47.6 (*)    All other components within normal limits  COMPREHENSIVE METABOLIC PANEL - Abnormal; Notable for the following:    Glucose, Bld 134 (*)    GFR calc non Af Amer 60 (*)    GFR calc Af Amer 69 (*)    All other components within normal limits  PROTIME-INR - Abnormal; Notable for the following:    Prothrombin Time 19.2 (*)    INR 1.67 (*)    All other components within normal limits   Imaging Review No results found.  EKG Interpretation   None       MDM   Final diagnoses:  Bleeding from right ear   Discussed case with Dr. Sabra Heck. Patient has some small lacerations I packed ear with a cotton ball packing, and then saturated this with neo synephrine.  Started patient on Keflex and referred to ENT, patient can also see PCP if unable to see ENT, but packing needs to be removed MOnday or Tuesday.  73 y.o.[name]'s evaluation in the Emergency Department is complete. It has been determined that no acute conditions requiring further emergency intervention are present at this time. The patient/guardian have been advised of the diagnosis and plan. We have discussed signs and symptoms that warrant return to the ED, such as changes or worsening in symptoms.  Vital signs are stable at discharge. Filed Vitals:   01/08/14 2126  BP: 132/88  Pulse: 92  Temp: 97.2 F (36.2 C)  Resp: 18    Patient/guardian has voiced understanding and agreed to follow-up with the PCP or specialist.      Linus Mako, PA-C 01/08/14 2359

## 2014-01-09 NOTE — ED Provider Notes (Signed)
Medical screening examination/treatment/procedure(s) were performed by non-physician practitioner and as supervising physician I was immediately available for consultation/collaboration.    Johnna Acosta, MD 01/09/14 [phone]

## 2014-09-28 ENCOUNTER — Ambulatory Visit: Payer: Medicare Other | Admitting: *Deleted

## 2014-09-29 ENCOUNTER — Ambulatory Visit: Payer: Medicare Other | Attending: Physician Assistant | Admitting: Physical Therapy

## 2014-09-29 ENCOUNTER — Encounter: Payer: Self-pay | Admitting: Physical Therapy

## 2014-09-29 DIAGNOSIS — R269 Unspecified abnormalities of gait and mobility: Secondary | ICD-10-CM | POA: Diagnosis not present

## 2014-09-29 DIAGNOSIS — R6889 Other general symptoms and signs: Secondary | ICD-10-CM | POA: Diagnosis not present

## 2014-09-29 DIAGNOSIS — Z5189 Encounter for other specified aftercare: Secondary | ICD-10-CM | POA: Diagnosis not present

## 2014-09-29 DIAGNOSIS — M6281 Muscle weakness (generalized): Secondary | ICD-10-CM | POA: Diagnosis not present

## 2014-09-30 ENCOUNTER — Ambulatory Visit: Payer: Medicare Other | Admitting: Occupational Therapy

## 2014-09-30 ENCOUNTER — Encounter: Payer: Self-pay | Admitting: Physical Therapy

## 2014-09-30 NOTE — Therapy (Signed)
Physical Therapy Evaluation  Patient Details  Name: [name] MRN: [id number] Date of Birth: 1940-11-25  Encounter Date: 09/29/2014      PT End of Session - 09/29/14 1459    Visit Number 1   Number of Visits 17   Date for PT Re-Evaluation 11/26/14   PT Start Time 1320   PT Stop Time 1430   PT Time Calculation (min) 70 min   Equipment Utilized During Treatment Gait belt   Activity Tolerance Patient limited by fatigue   Behavior During Therapy Flat affect      Past Medical History  Diagnosis Date  . Arthritis   . Atrial fibrillation   . CVA (cerebral infarction)   . Seizures     Past Surgical History  Procedure Laterality Date  . Total hip arthroplasty    . Joint replacement    . Fracture surgery      There were no vitals taken for this visit.  Visit Diagnosis:  Generalized muscle weakness  Abnormality of gait  Activity intolerance      Subjective Assessment - 09/29/14 1336    Symptoms No pain, but feels fatigued and weak. Patient & son report for PT evaluation. He reports the doctor found increased weakness with decreased mobility.   Patient Stated Goals To improve balance & gait for improved mobility   Currently in Pain? No/denies          [facility] PT Assessment - 09/29/14 1315    Assessment   Medical Diagnosis Muscle Weakness, Gait Abnormality   Onset Date --  weakness increased over last year   Prior Therapy 2010 after CVA   Precautions   Precautions Fall   Balance Screen   Has the patient fallen in the past 6 months No   Has the patient had a decrease in activity level because of a fear of falling?  Yes   Is the patient reluctant to leave their home because of a fear of falling?  Yes   [place] Private residence   [place];Other (Comment)  grandson (23) & son's friend   Available Help at Discharge Family;Available 24 hours/day   Type of Home House   Home Access Stairs to enter   Entrance  Stairs-Number of Steps 5   Entrance Stairs-Rails Right;Left  cannot reach both at same time   Home Layout One level   [employer] - single point;Walker - 2 wheels;Grab bars - tub/shower;Wheelchair - manual;Other (comment)  uses walk-in shower in son's room   Prior Function   Level of Independence Independent with gait;Independent with basic ADLs;Independent with transfers   Cognition   Overall Cognitive Status Difficult to assess   Difficult to assess due to --  flat affect with limited responses, followed commands delay   Observation/Other Assessments   Skin Integrity Pt has mass palpated on lateral (R) mid thigh approximately 2 inches   Focus on Therapeutic Outcomes (FOTO)  24  Functional Status   Neuro Quality of Life  LE 35.8, UE 30.6   Fear Avoidance Belief Questionnaire (FABQ)  26   Posture/Postural Control   Posture Comments Decreased postural control, sits with significant posterior lean.   Strength   Overall Strength Deficits;Due to pain;Other (comment)  also limited by delayed ability to follow directions   Overall Strength Comments --   Right Hip Flexion 3-/5   Right Hip Extension --  appears <3/5 but unable to position to test   Right Hip ABduction  2+/5   Left Hip Flexion 3-/5   Left Hip Extension --  appears <3/5 but unable to position to test   Left Hip ABduction 2+/5   Right Knee Extension 3-/5   Left Knee Extension 3-/5   Right Ankle Dorsiflexion 4/5   Left Ankle Dorsiflexion 4/5   Bed Mobility   Bed Mobility Rolling Right;Rolling Left;Supine to Sit;Sit to Supine   Rolling Right Other (comment);4: Min assist  Requires use of UE to roll & maintain sidelying   Rolling Right Details (indicate cue type and reason) verbal cues   Rolling Left 4: Min assist;Other (comment)  requires UEs to roll & maintain sidelying   Rolling Left Details (indicate cue type and reason) verbal cues   Supine to Sit 4: Min guard;Other (comment)  Requires use of UE to pull up,  verbal cues   Sit to Supine 4: Min guard;Other (comment)  Needs assist to fully move LEs onto bed, verbal cues   Transfers   Transfers Sit to Stand;Stand to Sit;Stand Pivot Transfers   Sit to Stand 4: Min guard;With upper extremity assist;With armrests;From chair/3-in-1;Other/comment  multi attempts, requires UE to arise & stabalize,   Stand to Sit 4: Min guard;With upper extremity assist;With armrests;To chair/3-in-1  does not control descent safely   Stand Pivot Transfers 4: Min assist;With armrests;Other (comment)  uses cane in RUE and hand held assist from PT in [place] Transfer Details (indicate cue type and reason) verbal cues; pt has fear of falling and requests PT hand held assist in other UE when turning   Ambulation/Gait   Ambulation/Gait Yes   Ambulation/Gait Assistance 4: Min guard   Ambulation Distance (Feet) 40 Feet  40' during assessment, arrived 95' to room   Assistive device Rolling walker;Straight cane  arrrived using cane, but assessed with RW for safety   Gait Pattern Step-through pattern;Decreased stride length;Decreased hip/knee flexion - right;Decreased hip/knee flexion - left;Right foot flat;Left foot flat;Shuffle;Narrow base of support;Trunk flexed;Poor foot clearance - left;Poor foot clearance - right  Poor heel clearance (B) scuffing heels at initial contact   Gait velocity 0.85 ft/sec  indicates Fall Risk (<1.8 ft/sec), household gait   Stairs --   Ramp --  Needs assessment with LRAD   Curb --   Static Sitting Balance   Static Sitting - Balance Support No upper extremity supported;Feet supported   Static Sitting - Level of Assistance 6: Modified independent (Device/Increase time)   Static Sitting - Comment/# of Minutes limited to <5 minutes without back support  sacral sits, flexed trunk   Static Standing Balance   Static Standing - Balance Support Bilateral upper extremity supported;During functional activity;Right upper extremity supported    Static Standing - Level of Assistance 5: Stand by assistance;4: Min assist;Other (comment)  SBA with bil. UE support, min A with 1 UE support   Static Standing Balance -  Activities  --  Standing with feet apart   Dynamic Standing Balance   Dynamic Standing - Balance Support Right upper extremity supported;During functional activity  while toileting /reaching for objects ant. & to floor   Dynamic Standing - Level of Assistance 4: Min assist;Other (comment)  holding grab bar   Dynamic Standing - Balance Activities Eyes opn;Reaching for objects;Other/ Comments  reaching 5" for grab bar & pants (10" from floor)   Dynamic Standing - Comments Requires UE assist & physical assist to reach  Weakness followed by fear limit standing ADLs  PT Education - 09/29/14 1457    Education provided Yes   Education Details PT role in treatment of weakness and balance difficulties. Patient role in getting better through HEP compliance and committment to PT   Person(s) Educated Child(ren);Patient   Methods Explanation   Comprehension Verbalized understanding;Need further instruction          PT Short Term Goals - 09/29/14 1630    PT SHORT TERM GOAL #1   Title Demonstrate HEP with minimal cueing for accuracy from PT (Target date 10-29-14)   Time 4   Period Weeks   Status New   PT SHORT TERM GOAL #2   Title Ambulate 233ft with RW with supervision (Target date 10-29-14)   Time 4   Period Weeks   Status New   PT SHORT TERM GOAL #3   Title Increase gait speed from 0.85 ft/sec to >1.00 ft/sec (Target date 10-29-14)   Time 4   Period Weeks   Status New   PT SHORT TERM GOAL #4   Title Demonstrate supine<>sit and rolling bed mobility with supervision. (Target date 10-29-14)   Time 4   Period Weeks   Status New   PT SHORT TERM GOAL #5   Title standing with walker, reaches 10" anteriorly & manages pants for toileting with supervision. (Target date 10-29-14)   Time 4   Period Weeks    Status New          PT Long Term Goals - 09/29/14 1640    PT LONG TERM GOAL #1   Title Demonstrate HEP with supervision from family member correctly (Target date 11-26-14)   Time 8   Period Weeks   Status New   PT LONG TERM GOAL #2   Title Ambulate 114ft with LRAD modified independent for household mobility. (Target date 11-26-14)   Time 8   Period Weeks   Status New   PT LONG TERM GOAL #3   Title ambulates 250' with rolling walker with family supervision for community mobility. (Target date 11-26-14)   Time 8   Period Weeks   Status New   PT LONG TERM GOAL #4   Title demonstrates bed mobility modified independent. (Target date 11-26-14)   Baseline --   Time 8   Period Weeks   Status New   PT LONG TERM GOAL #5   Title negotiates ramp, curb, stairs with LRAD with minimal assist from family safely. (Target date 11-26-14)   Time 8   Period Weeks   Status New   Additional Long Term Goals   Additional Long Term Goals Yes   PT LONG TERM GOAL #6   Title performs standing ADLs like managing pants to toilet & reaching in cabinets with UE support modified independent. (Target date 11-26-14)   Time 8   Period Weeks   Status New          Plan - 09/29/14 1502    Clinical Impression Statement PMH included hip replacement followed by CVA. She has progressively self limited mobility in home and in the community due to fear of falling after these medical issues. Prolonged significant sedentary activity level has resulted in extreme weakness which further limits her activity tolerance, increases her fall risk & fear of fallling.    Pt will benefit from skilled therapeutic intervention in order to improve on the following deficits Abnormal gait;Difficulty walking;Decreased activity tolerance;Decreased balance;Decreased knowledge of precautions;Decreased knowledge of use of DME;Decreased mobility;Decreased safety awareness;Decreased strength   Rehab Potential Good  Clinical Impairments Affecting  Rehab Potential Weakness, activity tolerance, balance, motivation   PT Frequency 2x / week   PT Duration 8 weeks   PT Treatment/Interventions Therapeutic activities;Patient/family education;Therapeutic exercise;Balance training;Gait training;Neuromuscular re-education;Energy conservation;Functional mobility training;ADLs/Self Care Home Management;DME Instruction;Stair training   PT Next Visit Plan Initiate HEP supine (bridge, abduction, etc), gait with rolling walker   Consulted and Agree with Plan of Care Patient;Family member/caregiver          G-Codes - 10-05-14 1006    Functional Assessment Tool Used Patient ambulates 75' with rolling walker with minimal assist. Patient requires minimal assist with UE support to reach 5".   Functional Limitation Mobility: Walking and moving around   Mobility: Walking and Moving Around Current Status [phone]) At least 80 percent but less than 100 percent impaired, limited or restricted   Mobility: Walking and Moving Around Goal Status [phone]) At least 60 percent but less than 80 percent impaired, limited or restricted      Problem List There are no active problems to display for this patient.   This entire session of physical therapy was performed under the direct supervision of PT signing evaluation /treatment.  Blima Rich, Student PT  Jamey Reas 10/05/2014, 11:54 AM

## 2014-10-01 NOTE — Addendum Note (Signed)
Addended by: Rudell Cobb M on: 10/01/2014 03:01 PM   Modules accepted: Orders

## 2014-10-04 ENCOUNTER — Encounter: Payer: Medicare Other | Admitting: Occupational Therapy

## 2014-10-04 ENCOUNTER — Ambulatory Visit: Payer: Medicare Other | Admitting: Occupational Therapy

## 2014-10-04 DIAGNOSIS — M6281 Muscle weakness (generalized): Secondary | ICD-10-CM

## 2014-10-04 NOTE — Therapy (Signed)
Occupational Therapy Treatment  Patient Details  Name: [name] MRN: [id number] Date of Birth: [birth year]  Encounter Date: 10/04/2014    Past Medical History  Diagnosis Date  . Arthritis   . Atrial fibrillation   . CVA (cerebral infarction)   . Seizures     Past Surgical History  Procedure Laterality Date  . Total hip arthroplasty    . Joint replacement    . Fracture surgery      There were no vitals taken for this visit.  Visit Diagnosis:  Generalized muscle weakness  Patient arrived with her son for OT evaluation today.  Patient appeared extremely confused and had great difficulty following one step directions from son.  Son stated he had noticed significantly increased confusion in the patient over the past 4 days. Offered to take patient's BP and discussed that given patient's medical history, recommendation would be that patient be seem immediately by MD.  Patient's son stated he would not wait for BP check and wanted to take patient to ED immediately.  Assisted son in helping patient to car. Son will contact us with appropriate follow up.                 Problem List There are no active problems to display for this patient.                                            Forde Radon, MS, OTR/L [facility] [address] [place], [place] [address] [phone] Phone [phone] Fax  Judieth Keens, Estrella Myrtle. 10/04/2014, 10:45 AM

## 2014-10-08 ENCOUNTER — Ambulatory Visit: Payer: Medicare Other | Admitting: Physical Therapy

## 2014-10-11 ENCOUNTER — Ambulatory Visit: Payer: Medicare Other | Admitting: Physical Therapy

## 2014-10-11 ENCOUNTER — Ambulatory Visit: Payer: Medicare Other | Admitting: Occupational Therapy

## 2014-10-19 ENCOUNTER — Ambulatory Visit: Payer: Medicare Other | Admitting: Occupational Therapy

## 2014-10-19 ENCOUNTER — Ambulatory Visit: Payer: Medicare Other | Admitting: Physical Therapy

## 2014-10-21 ENCOUNTER — Encounter: Payer: Medicare Other | Admitting: Occupational Therapy

## 2014-10-21 ENCOUNTER — Ambulatory Visit: Payer: Medicare Other | Admitting: Physical Therapy

## 2014-10-26 ENCOUNTER — Ambulatory Visit: Payer: Medicare Other | Admitting: Physical Therapy

## 2014-10-26 ENCOUNTER — Encounter: Payer: Medicare Other | Admitting: Occupational Therapy

## 2014-10-29 ENCOUNTER — Encounter: Payer: Medicare Other | Admitting: Occupational Therapy

## 2014-10-29 ENCOUNTER — Ambulatory Visit: Payer: Medicare Other | Admitting: Physical Therapy

## 2014-11-02 ENCOUNTER — Encounter (HOSPITAL_COMMUNITY): Payer: Self-pay | Admitting: *Deleted

## 2014-11-02 ENCOUNTER — Emergency Department (HOSPITAL_COMMUNITY)
Admission: EM | Admit: 2014-11-02 | Discharge: 2014-11-02 | Disposition: A | Discharge status: Home / Self Care | Payer: Medicare Other | Attending: Emergency Medicine | Admitting: Emergency Medicine

## 2014-11-02 ENCOUNTER — Emergency Department (HOSPITAL_COMMUNITY): Payer: Medicare Other

## 2014-11-02 ENCOUNTER — Ambulatory Visit: Payer: Medicare Other | Admitting: Physical Therapy

## 2014-11-02 ENCOUNTER — Encounter: Payer: Medicare Other | Admitting: Occupational Therapy

## 2014-11-02 DIAGNOSIS — Z8673 Personal history of transient ischemic attack (TIA), and cerebral infarction without residual deficits: Secondary | ICD-10-CM | POA: Diagnosis not present

## 2014-11-02 DIAGNOSIS — R569 Unspecified convulsions: Secondary | ICD-10-CM | POA: Diagnosis not present

## 2014-11-02 DIAGNOSIS — N390 Urinary tract infection, site not specified: Secondary | ICD-10-CM | POA: Insufficient documentation

## 2014-11-02 DIAGNOSIS — R410 Disorientation, unspecified: Secondary | ICD-10-CM | POA: Diagnosis not present

## 2014-11-02 DIAGNOSIS — Z792 Long term (current) use of antibiotics: Secondary | ICD-10-CM | POA: Diagnosis not present

## 2014-11-02 DIAGNOSIS — Z79899 Other long term (current) drug therapy: Secondary | ICD-10-CM | POA: Diagnosis not present

## 2014-11-02 DIAGNOSIS — R4182 Altered mental status, unspecified: Secondary | ICD-10-CM | POA: Diagnosis present

## 2014-11-02 DIAGNOSIS — Z7982 Long term (current) use of aspirin: Secondary | ICD-10-CM | POA: Insufficient documentation

## 2014-11-02 LAB — URINALYSIS, ROUTINE W REFLEX MICROSCOPIC
BILIRUBIN URINE: NEGATIVE
GLUCOSE, UA: NEGATIVE mg/dL
HGB URINE DIPSTICK: NEGATIVE
Ketones, ur: NEGATIVE mg/dL
Nitrite: POSITIVE — AB
Protein, ur: NEGATIVE mg/dL
SPECIFIC GRAVITY, URINE: 1.007 (ref 1.005–1.030)
UROBILINOGEN UA: 1 mg/dL (ref 0.0–1.0)
pH: 7 (ref 5.0–8.0)

## 2014-11-02 LAB — CBC WITH DIFFERENTIAL/PLATELET
Basophils Absolute: 0 10*3/uL (ref 0.0–0.1)
Basophils Relative: 1 % (ref 0–1)
Eosinophils Absolute: 0.2 10*3/uL (ref 0.0–0.7)
Eosinophils Relative: 2 % (ref 0–5)
HCT: 48.9 % — ABNORMAL HIGH (ref 36.0–46.0)
HEMOGLOBIN: 16.6 g/dL — AB (ref 12.0–15.0)
LYMPHS ABS: 1.9 10*3/uL (ref 0.7–4.0)
LYMPHS PCT: 30 % (ref 12–46)
MCH: 30.7 pg (ref 26.0–34.0)
MCHC: 33.9 g/dL (ref 30.0–36.0)
MCV: 90.6 fL (ref 78.0–100.0)
MONOS PCT: 9 % (ref 3–12)
Monocytes Absolute: 0.5 10*3/uL (ref 0.1–1.0)
NEUTROS ABS: 3.7 10*3/uL (ref 1.7–7.7)
NEUTROS PCT: 58 % (ref 43–77)
Platelets: 157 10*3/uL (ref 150–400)
RBC: 5.4 MIL/uL — AB (ref 3.87–5.11)
RDW: 11.9 % (ref 11.5–15.5)
WBC: 6.3 10*3/uL (ref 4.0–10.5)

## 2014-11-02 LAB — COMPREHENSIVE METABOLIC PANEL
ALBUMIN: 3.5 g/dL (ref 3.5–5.2)
ALK PHOS: 94 U/L (ref 39–117)
ALT: 28 U/L (ref 0–35)
ANION GAP: 16 — AB (ref 5–15)
AST: 31 U/L (ref 0–37)
BILIRUBIN TOTAL: 1 mg/dL (ref 0.3–1.2)
BUN: 14 mg/dL (ref 6–23)
CHLORIDE: 104 meq/L (ref 96–112)
CO2: 17 mEq/L — ABNORMAL LOW (ref 19–32)
Calcium: 9.6 mg/dL (ref 8.4–10.5)
Creatinine, Ser: 0.69 mg/dL (ref 0.50–1.10)
GFR calc non Af Amer: 83 mL/min — ABNORMAL LOW (ref >90)
GLUCOSE: 101 mg/dL — AB (ref 70–99)
POTASSIUM: 4.8 meq/L (ref 3.7–5.3)
Sodium: 137 mEq/L (ref 137–147)
Total Protein: 7.9 g/dL (ref 6.0–8.3)

## 2014-11-02 LAB — URINE MICROSCOPIC-ADD ON

## 2014-11-02 LAB — TROPONIN I: Troponin I: <0.3 ng/mL (ref <0.30)

## 2014-11-02 LAB — RAPID URINE DRUG SCREEN, HOSP PERFORMED
AMPHETAMINES: NOT DETECTED
BARBITURATES: NOT DETECTED
Benzodiazepines: NOT DETECTED
COCAINE: NOT DETECTED
Opiates: NOT DETECTED
TETRAHYDROCANNABINOL: NOT DETECTED

## 2014-11-02 LAB — CBG MONITORING, ED: Glucose-Capillary: 90 mg/dL (ref 70–99)

## 2014-11-02 MED ORDER — CEFTRIAXONE SODIUM 1 G IJ SOLR
1.0000 g | Freq: Once | INTRAMUSCULAR | Status: AC
  Administered 2014-11-02: 1 g via INTRAMUSCULAR
  Filled 2014-11-02: qty 10

## 2014-11-02 MED ORDER — LIDOCAINE HCL (PF) 1 % IJ SOLN
INTRAMUSCULAR | Status: AC
  Filled 2014-11-02: qty 5

## 2014-11-02 MED ORDER — LEVOFLOXACIN 500 MG PO TABS
750.0000 mg | ORAL_TABLET | Freq: Every day | ORAL | Status: DC
Start: 2014-11-02 — End: 2015-02-15

## 2014-11-02 MED ORDER — LORAZEPAM 1 MG PO TABS: ORAL_TABLET | ORAL | Status: DC

## 2014-11-02 MED ORDER — LIDOCAINE HCL (PF) 1 % IJ SOLN
2.0000 mL | Freq: Once | INTRAMUSCULAR | Status: AC
  Administered 2014-11-02: 2 mL via INTRADERMAL

## 2014-11-02 MED ORDER — DEXTROSE 5 % IV SOLN
1.0000 g | Freq: Once | INTRAVENOUS | Status: AC
  Administered 2014-11-02: 1 g via INTRAVENOUS
  Filled 2014-11-02: qty 10

## 2014-11-02 NOTE — Discharge Planning (Signed)
CARE MANAGEMENT ED NOTE 11/02/2014  Patient:  Sircy,Braniya B   Account Number:  [id number]  Date Initiated:  11/02/2014  Documentation initiated by:  [facility]  Subjective/Objective Assessment:   Patient is a 73 y.o. female presenting with altered mental status and chest pain.//Home with son     Subjective/Objective Assessment Detail:   UTI//the pt according to son is more confused then normal     Action/Plan:   tx with levaquin and close follow up with pcp//PCP is [name].   Action/Plan Detail:   Will consult Case Manager for help with care at home   Anticipated DC Date:  11/02/2014     DC Planning Services  CM consult   [facility] Choice  HOME HEALTH   Choice offered to / List presented to:  C-4 Adult Children     HH arranged  HH-1 RN  [place].    Status of service:  Completed, signed off  ED Comments:  Caoilainn Sacks J. Clydene Laming, RN, BSN, [employer] [phone] Spoke with pt and son at bedside regarding discharge planning for [facility].  ED Comments Detail:  Offered pt list of home health agencies to choose from.  Pt son chose [facility] to render services. Amy of [place] notified.  No DME needs identified at this time.

## 2014-11-02 NOTE — ED Notes (Signed)
Notified RN of [facility]G 90
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
## 2014-11-02 NOTE — Discharge Instructions (Signed)
Follow up with your md in one week 

## 2014-11-02 NOTE — ED Notes (Signed)
Patient urinated in bed.   Cleaned patient and linen.

## 2014-11-02 NOTE — ED Notes (Signed)
Son contacted re: plan of care, son not pleased with plan of care to discharge, [place],

## 2014-11-02 NOTE — ED Provider Notes (Addendum)
CSN: [id number]     Arrival date & time 11/02/14  7829 History   First MD Initiated Contact with Patient 11/02/14 0900     Chief Complaint  Patient presents with  . Altered Mental Status  . Chest Pain     (Consider location/radiation/quality/duration/timing/severity/associated sxs/prior Treatment) Patient is a 72 y.o. female presenting with altered mental status and chest pain. The history is provided by a relative (the pt according to son is more confused then normal).  Altered Mental Status Presenting symptoms: confusion   Presenting symptoms: no behavior changes and no combativeness   Severity:  Mild Most recent episode:  Today Episode history:  Multiple Timing:  Intermittent Progression:  Waxing and waning Chronicity:  Recurrent Context: dementia   Associated symptoms: no abdominal pain, no hallucinations, no headaches, no rash and no seizures   Chest Pain Associated symptoms: altered mental status   Associated symptoms: no abdominal pain, no back pain, no cough, no fatigue and no headache     Past Medical History  Diagnosis Date  . Arthritis   . Atrial fibrillation   . CVA (cerebral infarction)   . Seizures    Past Surgical History  Procedure Laterality Date  . Total hip arthroplasty    . Joint replacement    . Fracture surgery     No family history on file. History  Substance Use Topics  . Smoking status: Never Smoker   . Smokeless tobacco: Not on file  . Alcohol Use: No   OB History    No data available     Review of Systems  Constitutional: Negative for appetite change and fatigue.  HENT: Negative for congestion, ear discharge and sinus pressure.   Eyes: Negative for discharge.  Respiratory: Negative for cough.   Cardiovascular: Positive for chest pain.  Gastrointestinal: Negative for abdominal pain and diarrhea.  Genitourinary: Negative for frequency and hematuria.  Musculoskeletal: Negative for back pain.  Skin: Negative for rash.   Neurological: Negative for seizures and headaches.  Psychiatric/Behavioral: Positive for confusion. Negative for hallucinations.      Allergies  Review of patient's allergies indicates no known allergies.  Home Medications   Prior to Admission medications   Medication Sig Start Date End Date Taking? Authorizing Provider  acetaminophen (TYLENOL) 325 MG tablet Take 325 mg by mouth every 6 (six) hours as needed for moderate pain.    Historical Provider, MD  aspirin EC 81 MG tablet Take 81 mg by mouth daily.      Historical Provider, MD  calcium elemental as carbonate (BARIATRIC TUMS ULTRA) 400 MG tablet Chew 1,000 mg by mouth 2 (two) times daily.      Historical Provider, MD  cephALEXin (KEFLEX) 500 MG capsule Take 1 capsule (500 mg total) by mouth 4 (four) times daily. 01/08/14   Linus Mako, PA-C  Cholecalciferol 1000 UNITS capsule Take 1,000 Units by mouth daily.    Historical Provider, MD  dabigatran (PRADAXA) 150 MG CAPS capsule Take 150 mg by mouth 2 (two) times daily.    Historical Provider, MD  esomeprazole (NEXIUM) 40 MG capsule Take 40 mg by mouth daily before breakfast.      Historical Provider, MD  furosemide (LASIX) 20 MG tablet Take 20 mg by mouth daily.      Historical Provider, MD  ketotifen (ZADITOR) 0.025 % ophthalmic solution Place 1 drop into both eyes daily as needed (for dry eyes).    Historical Provider, MD  levETIRAcetam (KEPPRA) 500 MG tablet Take 500 mg  by mouth 2 (two) times daily.      Historical Provider, MD  levofloxacin (LEVAQUIN) 500 MG tablet Take 1.5 tablets (750 mg total) by mouth daily. X 7 days 11/02/14   Maudry Diego, MD  metoprolol (LOPRESSOR) 50 MG tablet Take 50 mg by mouth daily.      Historical Provider, MD  niacin 500 MG tablet Take 500 mg by mouth daily with breakfast.      Historical Provider, MD  oxycodone (OXY-IR) 5 MG capsule Take 5 mg by mouth every 6 (six) hours as needed.    Historical Provider, MD  potassium chloride (KLOR-CON) 10  MEQ CR tablet Take 10 mEq by mouth daily.      Historical Provider, MD  simvastatin (ZOCOR) 40 MG tablet Take 40 mg by mouth at bedtime.      Historical Provider, MD  traMADol (ULTRAM-ER) 100 MG 24 hr tablet Take 100 mg by mouth daily as needed for pain.    Historical Provider, MD   BP 138/85 mmHg  Pulse 68  Temp(Src) 97.8 F (36.6 C) (Oral)  Resp 18  SpO2 100% Physical Exam  Constitutional: She is oriented to person, place, and time. She appears well-developed.  HENT:  Head: Normocephalic.  Eyes: Conjunctivae and EOM are normal. No scleral icterus.  Neck: Neck supple. No thyromegaly present.  Cardiovascular: Normal rate and regular rhythm.  Exam reveals no gallop and no friction rub.   No murmur heard. Pulmonary/Chest: No stridor. She has no wheezes. She has no rales. She exhibits no tenderness.  Abdominal: She exhibits no distension. There is no tenderness. There is no rebound.  Musculoskeletal: Normal range of motion. She exhibits no edema.  Lymphadenopathy:    She has no cervical adenopathy.  Neurological: She is oriented to person, place, and time. She exhibits normal muscle tone. Coordination normal.  Pt does have some thoughts that someone maybe giving her drugs  Skin: No rash noted. No erythema.  Psychiatric: She has a normal mood and affect. Her behavior is normal.    ED Course  Procedures (including critical care time) Labs Review Labs Reviewed  CBC WITH DIFFERENTIAL - Abnormal; Notable for the following:    RBC 5.40 (*)    Hemoglobin 16.6 (*)    HCT 48.9 (*)    All other components within normal limits  COMPREHENSIVE METABOLIC PANEL - Abnormal; Notable for the following:    CO2 17 (*)    Glucose, Bld 101 (*)    GFR calc non Af Amer 83 (*)    Anion gap 16 (*)    All other components within normal limits  URINALYSIS, ROUTINE W REFLEX MICROSCOPIC - Abnormal; Notable for the following:    Nitrite POSITIVE (*)    Leukocytes, UA TRACE (*)    All other components  within normal limits  URINE MICROSCOPIC-ADD ON - Abnormal; Notable for the following:    Bacteria, UA MANY (*)    All other components within normal limits  URINE CULTURE  TROPONIN I  URINE RAPID DRUG SCREEN (HOSP PERFORMED)  CBG MONITORING, ED    Imaging Review Dg Chest 2 View  11/02/2014   CLINICAL DATA:  Left-sided facial droop, history of stroke in 2009, confusion and hallucinations for past 3-4 days  EXAM: CHEST  2 VIEW  COMPARISON:  05/10/2009  FINDINGS: There is elevation of the left diaphragm. There is left basilar atelectasis versus scarring. There is bilateral interstitial prominence, likely chronic and unchanged compared with 05/10/2009. There is no focal  parenchymal opacity, pleural effusion, or pneumothorax. The heart and mediastinal contours are unremarkable.  There is mild thoracic spine spondylosis.  IMPRESSION: No active cardiopulmonary disease.   Electronically Signed   By: Kathreen Devoid   On: 11/02/2014 10:12   Ct Head Wo Contrast  11/02/2014   CLINICAL DATA:  Increased confusion, hallucinations  EXAM: CT HEAD WITHOUT CONTRAST  TECHNIQUE: Contiguous axial images were obtained from the base of the skull through the vertex without intravenous contrast.  COMPARISON:  05/10/2009  FINDINGS: No skull fracture is noted. Again noted right encephalomalacia from remote right MCA infarct. No intracranial hemorrhage, mass effect or midline shift. Stable mild cerebral atrophy. No definite acute cortical infarction. No mass lesion is noted on this unenhanced scan. Ventricular size is stable from prior exam. Mild periventricular white matter decreased attenuation probable due to chronic small vessel ischemic changes.  IMPRESSION: No acute intracranial abnormality. Stable encephalomalacia from prior right MCA infarct. Mild cerebral atrophy. Mild periventricular white matter decreased attenuation probable due to chronic small vessel ischemic changes. No definite acute cortical infarction.    Electronically Signed   By: Lahoma Crocker M.D.   On: 11/02/2014 10:01     EKG Interpretation None      MDM   Final diagnoses:  Confused  UTI (lower urinary tract infection)    Uti,   tx with levaquin and close follow up with pcp  Will consult social worker for help with care at home  Maudry Diego, MD 11/02/14 [place], MD 11/02/14 [phone]

## 2014-11-02 NOTE — ED Notes (Signed)
Verified with lab that they would add on culture to urine in lab

## 2014-11-04 ENCOUNTER — Ambulatory Visit: Payer: Medicare Other | Admitting: Physical Therapy

## 2014-11-04 ENCOUNTER — Encounter: Payer: Medicare Other | Admitting: Occupational Therapy

## 2014-11-05 LAB — URINE CULTURE
Colony Count: >=100000
Special Requests: NORMAL

## 2014-11-06 ENCOUNTER — Emergency Department (HOSPITAL_COMMUNITY)
Admission: EM | Admit: 2014-11-06 | Discharge: 2014-11-06 | Disposition: A | Discharge status: Home / Self Care | Payer: Medicare Other | Attending: Emergency Medicine | Admitting: Emergency Medicine

## 2014-11-06 ENCOUNTER — Encounter (HOSPITAL_COMMUNITY): Payer: Self-pay | Admitting: Emergency Medicine

## 2014-11-06 ENCOUNTER — Emergency Department (HOSPITAL_COMMUNITY): Payer: Medicare Other

## 2014-11-06 DIAGNOSIS — Z7982 Long term (current) use of aspirin: Secondary | ICD-10-CM | POA: Diagnosis not present

## 2014-11-06 DIAGNOSIS — Z79899 Other long term (current) drug therapy: Secondary | ICD-10-CM | POA: Insufficient documentation

## 2014-11-06 DIAGNOSIS — N390 Urinary tract infection, site not specified: Secondary | ICD-10-CM | POA: Insufficient documentation

## 2014-11-06 DIAGNOSIS — R079 Chest pain, unspecified: Secondary | ICD-10-CM | POA: Diagnosis present

## 2014-11-06 DIAGNOSIS — M1990 Unspecified osteoarthritis, unspecified site: Secondary | ICD-10-CM | POA: Insufficient documentation

## 2014-11-06 DIAGNOSIS — Z8673 Personal history of transient ischemic attack (TIA), and cerebral infarction without residual deficits: Secondary | ICD-10-CM | POA: Diagnosis not present

## 2014-11-06 DIAGNOSIS — R410 Disorientation, unspecified: Secondary | ICD-10-CM | POA: Insufficient documentation

## 2014-11-06 DIAGNOSIS — I4891 Unspecified atrial fibrillation: Secondary | ICD-10-CM | POA: Insufficient documentation

## 2014-11-06 LAB — BASIC METABOLIC PANEL
Anion gap: 17 — ABNORMAL HIGH (ref 5–15)
BUN: 17 mg/dL (ref 6–23)
CO2: 16 meq/L — AB (ref 19–32)
Calcium: 9.2 mg/dL (ref 8.4–10.5)
Chloride: 105 mEq/L (ref 96–112)
Creatinine, Ser: 0.72 mg/dL (ref 0.50–1.10)
GFR calc Af Amer: >90 mL/min (ref >90)
GFR, EST NON AFRICAN AMERICAN: 82 mL/min — AB (ref >90)
GLUCOSE: 112 mg/dL — AB (ref 70–99)
POTASSIUM: 4.1 meq/L (ref 3.7–5.3)
Sodium: 138 mEq/L (ref 137–147)

## 2014-11-06 LAB — URINALYSIS, ROUTINE W REFLEX MICROSCOPIC
BILIRUBIN URINE: NEGATIVE
Glucose, UA: NEGATIVE mg/dL
Hgb urine dipstick: NEGATIVE
Ketones, ur: NEGATIVE mg/dL
Leukocytes, UA: NEGATIVE
Nitrite: NEGATIVE
Protein, ur: NEGATIVE mg/dL
Specific Gravity, Urine: 1.021 (ref 1.005–1.030)
UROBILINOGEN UA: 1 mg/dL (ref 0.0–1.0)
pH: 6 (ref 5.0–8.0)

## 2014-11-06 LAB — CBC
HCT: 46.3 % — ABNORMAL HIGH (ref 36.0–46.0)
HEMOGLOBIN: 15.4 g/dL — AB (ref 12.0–15.0)
MCH: 30.3 pg (ref 26.0–34.0)
MCHC: 33.3 g/dL (ref 30.0–36.0)
MCV: 91.1 fL (ref 78.0–100.0)
Platelets: 163 10*3/uL (ref 150–400)
RBC: 5.08 MIL/uL (ref 3.87–5.11)
RDW: 11.9 % (ref 11.5–15.5)
WBC: 5.9 10*3/uL (ref 4.0–10.5)

## 2014-11-06 LAB — PRO B NATRIURETIC PEPTIDE: Pro B Natriuretic peptide (BNP): 1923 pg/mL — ABNORMAL HIGH (ref 0–450)

## 2014-11-06 LAB — I-STAT TROPONIN, ED: TROPONIN I, POC: 0.01 ng/mL (ref 0.00–0.08)

## 2014-11-06 MED ORDER — SODIUM CHLORIDE 0.9 % IV BOLUS (SEPSIS)
700.0000 mL | Freq: Once | INTRAVENOUS | Status: AC
  Administered 2014-11-06: 700 mL via INTRAVENOUS

## 2014-11-06 NOTE — ED Notes (Signed)
[name], CM, in w/pt and son.

## 2014-11-06 NOTE — Progress Notes (Signed)
CSW met with patient and her son to discuss the family concerns for patient.  The patient's son states that the patient has been living with him for the last six years since she had a stroke and in the last month she has had chronic UTI and has not been her baseline.  The patient is displaying some mild dementia symptoms.  Patient currently is verbalizing that she does not want to go home and called 911 last night after her son went to sleep.  The son discussed symptoms of "Lutricia Horsfall" and that it has been harder recently to care for her.  The son states he wants to continue to take care of her, "I bought this house so that she could live with me, I do not want her to go to a [place] facility unless she has too."  CSW offered supportive counseling.  CSW suggested that patient may be in need of home health, the son states he is willing to speak to her PCP about this as well as adjusting her anti-biotics for her UTI.  CSW discussed case with the Nurse CM who is talking to the family currently. Patient is being discharged back home with her family.  CSW signing off and will continue to be available as needed.  [facility] Rogue Pautler Richardo Priest ED CSW [phone]

## 2014-11-06 NOTE — Discharge Instructions (Signed)
Her tests today are normal, her urinary tract infection is gone. Follow up at [facility]  Chest Pain (Nonspecific) It is often hard to give a diagnosis for the cause of chest pain. There is always a chance that your pain could be related to something serious, such as a heart attack or a blood clot in the lungs. You need to follow up with your doctor. HOME CARE  If antibiotic medicine was given, take it as directed by your doctor. Finish the medicine even if you start to feel better.  For the next few days, avoid activities that bring on chest pain. Continue physical activities as told by your doctor.  Do not use any tobacco products. This includes cigarettes, chewing tobacco, and e-cigarettes.  Avoid drinking alcohol.  Only take medicine as told by your doctor.  Follow your doctor's suggestions for more testing if your chest pain does not go away.  Keep all doctor visits you made. GET HELP IF:  Your chest pain does not go away, even after treatment.  You have a rash with blisters on your chest.  You have a fever. GET HELP RIGHT AWAY IF:   You have more pain or pain that spreads to your arm, neck, jaw, back, or belly (abdomen).  You have shortness of breath.  You cough more than usual or cough up blood.  You have very bad back or belly pain.  You feel sick to your stomach (nauseous) or throw up (vomit).  You have very bad weakness.  You pass out (faint).  You have chills. This is an emergency. Do not wait to see if the problems will go away. Call your local emergency services (911 in [place] Do not drive yourself to the hospital. MAKE SURE YOU:   Understand these instructions.  Will watch your condition.  Will get help right away if you are not doing well or get worse. Document Released: 04/23/2008 Document Revised: 11/10/2013 Document Reviewed: 04/23/2008 [facility] Patient Information 2015 [place], [place]. This information is not intended to replace advice given to you by  your health care provider. Make sure you discuss any questions you have with your health care provider.

## 2014-11-06 NOTE — ED Notes (Signed)
Pt c/o left sided chest pain 9/10.  EKG performed, per Dr Audie Pinto unremarkable.  Dr. Audie Pinto assessed pt who denied chest pain at this time.

## 2014-11-06 NOTE — ED Notes (Signed)
Pt arrives with c/o CP starting about an hour ago, 324 MG aspirin. A fib per hx. 20  IV placed in R forearm. Dxed with UTI about 3 days ago, still on antibiotics. Denies pain at this time

## 2014-11-06 NOTE — Care Management (Signed)
ED CM consulted to met with patient and son [name] [phone] concerning [place] services. Patient present to [facility] ED with CP PMH dementia, CVA. Patient has Medicare, and lives at home with son. Patient is not ambulatory sits in w/c and uses a walker with assistance at home, as per son. Discussed the recommendations for  [facility] RN,PT, HHA.  Patient and family agreeable with discharge plan.  Offered choice, list provided. Son selected [facility]., verified Demographic information with son.  No DME needed as per son, Referral faxed into  Intake Department at [facility], fax confirmation. Patient made aware that someone from [facility] should reach out to schedule initial visit within 24-48 hours. Patient and son verbalized understanding teach back done. Provided my contact information if they have not received a call within the next 48 hours.  Updated Dr. Audie Pinto and Jacqlyn Larsen RN on [facility] on discharge plan. Patient will be discharged and transported home via private vehicle with son. No further ED CM needs identified

## 2014-11-06 NOTE — Progress Notes (Signed)
ED Antimicrobial Stewardship Positive Culture Follow Up   [name] is an 73 y.o. female who presented to [facility] on 11/02/2014 with a chief complaint of  Chief Complaint  Patient presents with  . Altered Mental Status  . Chest Pain    Recent Results (from the past 720 hour(s))  Urine culture     Status: None   Collection Time: 11/02/14 10:39 AM  Result Value Ref Range Status   Specimen Description URINE, RANDOM  Final   Special Requests Normal  Final   Culture  Setup Time   Final    11/02/2014 12:05 Performed at [facility]   Final    >=100,000 COLONIES/ML Performed at [facility]    Culture   Final    ESCHERICHIA COLI Performed at [facility]    Report Status 11/05/2014 FINAL  Final   Organism ID, Bacteria ESCHERICHIA COLI  Final      Susceptibility   Escherichia coli - MIC*    AMPICILLIN 8 SENSITIVE Sensitive     CEFAZOLIN <=4 SENSITIVE Sensitive     CEFTRIAXONE <=1 SENSITIVE Sensitive     CIPROFLOXACIN >=4 RESISTANT Resistant     GENTAMICIN <=1 SENSITIVE Sensitive     LEVOFLOXACIN >=8 RESISTANT Resistant     NITROFURANTOIN <=16 SENSITIVE Sensitive     TOBRAMYCIN <=1 SENSITIVE Sensitive     TRIMETH/SULFA >=320 RESISTANT Resistant     PIP/TAZO <=4 SENSITIVE Sensitive     * ESCHERICHIA COLI    Patient presented on 12/15 with AMS and was treated with Levofloxacin for possible UTI. Culture grew Ecoli that was resistant to Levofloxacin. Patient presented to ED again on 12/19 with abdominal and chest pain. Repeat UA was collected had cleared up since 12/15 collection (MD aware of resistant organism per notes). Per [facility] discharge summary, patient's tests today are normal and her UTI is gone - patient to follow-up at [facility].   Discussed with PA Piepenbrink who agreed no additional follow-up needed.    [name] 11/06/2014, 9:15 PM Infectious Diseases Pharmacist Phone# [phone]

## 2014-11-06 NOTE — ED Provider Notes (Signed)
CSN: [id number]     Arrival date & time 11/06/14  1224 History  This chart was scribed for Janice Norrie, MD by Molli Posey, ED Scribe. This patient was seen in room A08C/A08C and the patient's care was started 3:41 AM.    Chief Complaint  Patient presents with  . Chest Pain   The history is provided by the patient and a relative. No language interpreter was used.   LEVEL 5 CAVEAT - DEMENTIA   HPI Comments: [name] is a 73 y.o. female who presents to the Emergency Department complaining of intermittent abdominal pain that started yesterday. . Her son states that she was diagnosed with a UTI 3 days ago. He states she gets UTIs very frequently and she has had to switch her Abx multiple times since October because the infection was resistant. He states they tried macrobid, rocephin once in the ED, keflex, ciprofloxacin and Levaquin which she is on currently. He says that his mom has experienced dementia symptoms since the onset of the UTIs about 4 weeks ago. He reports she is not behaving like she does at her baseline and seems to be paranoid and having confusion. She has thought she say marijuana and heroin on the floor of their house. He says he checked on her before bed and she said she was fine, then he woke up 30 minutes later to the paramedics at their home because she had called. He states she has had hematuria periodically, but she is on pradaxa and that her urine has a strong, bad odor. He denies fever and vomiting. She is having urinary frequency without dysuria. He states her urine started smelling bad yesterday. Pt states her pain is gone when she got to the ED. She told EMS she was having chest pain, but she tells me she was having abdominal pain.   October 12, 2014 Urine culture showed mixed flora   PCP [facility]   Past Medical History  Diagnosis Date  . Arthritis   . Atrial fibrillation   . CVA (cerebral infarction)   . Seizures    Past Surgical History  Procedure  Laterality Date  . Total hip arthroplasty    . Joint replacement    . Fracture surgery     No family history on file. History  Substance Use Topics  . Smoking status: Never Smoker   . Smokeless tobacco: Not on file  . Alcohol Use: No   OB History    No data available     Review of Systems  Unable to perform ROS: Dementia  Constitutional: Negative for fever.  Gastrointestinal: Positive for abdominal pain. Negative for vomiting.  Genitourinary: Positive for frequency and hematuria.    Allergies  Review of patient's allergies indicates no known allergies.  Home Medications   Prior to Admission medications   Medication Sig Start Date End Date Taking? Authorizing Provider  acetaminophen (TYLENOL) 325 MG tablet Take 325 mg by mouth every 6 (six) hours as needed for moderate pain.   Yes Historical Provider, MD  aspirin EC 81 MG tablet Take 81 mg by mouth daily.     Yes Historical Provider, MD  calcium elemental as carbonate (BARIATRIC TUMS ULTRA) 400 MG tablet Chew 1,000 mg by mouth 2 (two) times daily.     Yes Historical Provider, MD  Cholecalciferol 1000 UNITS capsule Take 1,000 Units by mouth daily.   Yes Historical Provider, MD  dabigatran (PRADAXA) 150 MG CAPS capsule Take 150 mg by mouth  2 (two) times daily.   Yes Historical Provider, MD  esomeprazole (NEXIUM) 40 MG capsule Take 40 mg by mouth daily before breakfast.     Yes Historical Provider, MD  furosemide (LASIX) 20 MG tablet Take 20 mg by mouth daily.     Yes Historical Provider, MD  ketotifen (ZADITOR) 0.025 % ophthalmic solution Place 1 drop into both eyes daily as needed (for dry eyes).   Yes Historical Provider, MD  levETIRAcetam (KEPPRA) 500 MG tablet Take 500 mg by mouth 2 (two) times daily.     Yes Historical Provider, MD  levofloxacin (LEVAQUIN) 500 MG tablet Take 1.5 tablets (750 mg total) by mouth daily. X 7 days 11/02/14  Yes Maudry Diego, MD  LORazepam (ATIVAN) 1 MG tablet Take one pill at bed time for  sleep and one every 8-12hours for aggitation 11/02/14  Yes Maudry Diego, MD  metoprolol (LOPRESSOR) 50 MG tablet Take 50 mg by mouth daily.     Yes Historical Provider, MD  niacin 500 MG tablet Take 500 mg by mouth daily with breakfast.     Yes Historical Provider, MD  oxycodone (OXY-IR) 5 MG capsule Take 5 mg by mouth every 6 (six) hours as needed for pain.    Yes Historical Provider, MD  potassium chloride (KLOR-CON) 10 MEQ CR tablet Take 10 mEq by mouth daily.     Yes Historical Provider, MD  QUEtiapine (SEROQUEL) 25 MG tablet Take 25 mg by mouth at bedtime.  10/14/14 11/13/14 Yes Historical Provider, MD  simvastatin (ZOCOR) 40 MG tablet Take 40 mg by mouth at bedtime.     Yes Historical Provider, MD  traMADol (ULTRAM-ER) 100 MG 24 hr tablet Take 100 mg by mouth daily as needed for pain.   Yes Historical Provider, MD  cephALEXin (KEFLEX) 500 MG capsule Take 1 capsule (500 mg total) by mouth 4 (four) times daily. Patient not taking: Reported on 11/06/2014 01/08/14   Linus Mako, PA-C   BP 122/84 mmHg  Pulse 81  Temp(Src) 98.3 F (36.8 C) (Oral)  Resp 18  Ht 5\' 4"  (1.626 m)  SpO2 98%  Vital signs normal    Physical Exam  Constitutional: She is oriented to person, place, and time. She appears well-developed and well-nourished.  Non-toxic appearance. She does not appear ill. No distress.  Does not speak a lot. Seemed paranoid, she watches everyone closely.   HENT:  Head: Normocephalic and atraumatic.  Right Ear: External ear normal.  Left Ear: External ear normal.  Nose: Nose normal. No mucosal edema or rhinorrhea.  Mouth/Throat: Oropharynx is clear and moist and mucous membranes are normal. No dental abscesses or uvula swelling.  Tongue looks dry.   Eyes: Conjunctivae and EOM are normal. Pupils are equal, round, and reactive to light.  Neck: Normal range of motion and full passive range of motion without pain. Neck supple.  Cardiovascular: Normal rate, regular rhythm and  normal heart sounds.  Exam reveals no gallop and no friction rub.   No murmur heard. Pulmonary/Chest: Effort normal and breath sounds normal. No respiratory distress. She has no wheezes. She has no rhonchi. She has no rales. She exhibits no tenderness and no crepitus.  Abdominal: Soft. Normal appearance and bowel sounds are normal. She exhibits no distension. There is no tenderness. There is no rebound and no guarding.  Tender over the suprapubic area.   Musculoskeletal: Normal range of motion. She exhibits no edema or tenderness.  Moves all extremities well.   Neurological:  She is alert and oriented to person, place, and time. She has normal strength. No cranial nerve deficit.  Skin: Skin is warm, dry and intact. No rash noted. No erythema. No pallor.  Psychiatric: Her speech is delayed. She is slowed.  Nursing note and vitals reviewed.   ED Course  Procedures   DIAGNOSTIC STUDIES: Oxygen Saturation is 98% on RA, normal by my interpretation.    COORDINATION OF CARE: 3:57 AM Discussed treatment plan with pt at bedside and pt agreed to plan.  Patient had no pain during my exam. Son states he did not want the EMS to bring his mother to the ED. He states his mother is fine. He states he thought the paramedic was new and that was why she insisted that his mother come to the ED.  Urine culture done December 15 shows more than 100,000 colonies of Escherichia coli sensitive to ampicillin, cefazolin, Rocephin, gentamicin, nitrofurantoin, Zosyn, and tobramycin. She was resistant to sulfa, Levaquin, and Cipro.  Son not here 06:00, 07:43 to discuss test results.   Nurses called patient's son, he states he can't come till after 10 AM. He now states he cannot longer take care at his mother at home. Social worker was contacted.  08 25 nurses report patient now complaining of chest pain. Dr. Audie Pinto is going to evaluate the patient.   Labs Review Results for orders placed or performed during the  hospital encounter of 11/06/14  CBC  Result Value Ref Range   WBC 5.9 4.0 - 10.5 K/uL   RBC 5.08 3.87 - 5.11 MIL/uL   Hemoglobin 15.4 (H) 12.0 - 15.0 g/dL   HCT 46.3 (H) 36.0 - 46.0 %   MCV 91.1 78.0 - 100.0 fL   MCH 30.3 26.0 - 34.0 pg   MCHC 33.3 30.0 - 36.0 g/dL   RDW 11.9 11.5 - 15.5 %   Platelets 163 150 - 400 K/uL  Basic metabolic panel  Result Value Ref Range   Sodium 138 137 - 147 mEq/L   Potassium 4.1 3.7 - 5.3 mEq/L   Chloride 105 96 - 112 mEq/L   CO2 16 (L) 19 - 32 mEq/L   Glucose, Bld 112 (H) 70 - 99 mg/dL   BUN 17 6 - 23 mg/dL   Creatinine, Ser 0.72 0.50 - 1.10 mg/dL   Calcium 9.2 8.4 - 10.5 mg/dL   GFR calc non Af Amer 82 (L) >90 mL/min   GFR calc Af Amer >90 >90 mL/min   Anion gap 17 (H) 5 - 15  BNP (order ONLY if patient complains of dyspnea/SOB AND you have documented it for THIS visit)  Result Value Ref Range   Pro B Natriuretic peptide (BNP) 1923.0 (H) 0 - 450 pg/mL  Urinalysis, Routine w reflex microscopic  Result Value Ref Range   Color, Urine YELLOW YELLOW   APPearance CLEAR CLEAR   Specific Gravity, Urine 1.021 1.005 - 1.030   pH 6.0 5.0 - 8.0   Glucose, UA NEGATIVE NEGATIVE mg/dL   Hgb urine dipstick NEGATIVE NEGATIVE   Bilirubin Urine NEGATIVE NEGATIVE   Ketones, ur NEGATIVE NEGATIVE mg/dL   Protein, ur NEGATIVE NEGATIVE mg/dL   Urobilinogen, UA 1.0 0.0 - 1.0 mg/dL   Nitrite NEGATIVE NEGATIVE   Leukocytes, UA NEGATIVE NEGATIVE  I-stat troponin, ED (not at [facility])  Result Value Ref Range   Troponin i, poc 0.01 0.00 - 0.08 ng/mL   Comment 3            Laboratory  interpretation all normal except elevated BNP    Imaging Review Dg Chest 2 View  11/06/2014   CLINICAL DATA:  Initial valuation for chest pain, shortness of breath.  EXAM: CHEST  2 VIEW  COMPARISON:  Prior study from 11/02/2014  FINDINGS: Moderate to severe cardiomegaly is stable from prior. Mediastinal silhouette within normal limits.  Lungs are hypoinflated. Mild bibasilar linear  opacities most consistent with atelectasis, most evident at the left lung base. No pulmonary edema. No focal infiltrate. No pneumothorax. No pleural effusion.  No acute osseus abnormality.  IMPRESSION: 1. Shallow lung inflation with mild bibasilar atelectasis. 2. Stable cardiomegaly without evidence of failure.   Electronically Signed   By: Jeannine Boga M.D.   On: 11/06/2014 05:25     Dg Chest 2 View  11/02/2014   CLINICAL DATA:  Left-sided facial droop, history of stroke in 2009, confusion and hallucinations for past 3-4 daysIMPRESSION: No active cardiopulmonary disease.   Electronically Signed   By: Kathreen Devoid   On: 11/02/2014 10:12   Ct Head Wo Contrast  11/02/2014   CLINICAL DATA:  Increased confusion, hallucinations   IMPRESSION: No acute intracranial abnormality. Stable encephalomalacia from prior right MCA infarct. Mild cerebral atrophy. Mild periventricular white matter decreased attenuation probable due to chronic small vessel ischemic changes. No definite acute cortical infarction.   Electronically Signed   By: Lahoma Crocker M.D.   On: 11/02/2014 10:01     EKG Interpretation   Date/Time:  Saturday November 06 2014 03:38:09 EST Ventricular Rate:  77 PR Interval:    QRS Duration: 103 QT Interval:  408 QTC Calculation: 462 R Axis:   -166 Text Interpretation:  Atrial fibrillation Probable right ventricular  hypertrophy Electrode noise No significant change since last tracing 02 Nov 2014 Confirmed by Tyrik Stetzer  MD-I, Jaleigh Mccroskey ([id number]) on 11/06/2014 4:12:20 AM      MDM   Final diagnoses:  Chest pain    I personally performed the services described in this documentation, which was scribed in my presence. The recorded information has been reviewed and considered.  Rolland Porter, MD, FACEP      Janice Norrie, MD 11/06/14 0830

## 2014-11-06 NOTE — ED Notes (Signed)
LEO, SW, AWARE PT'S SON HAS ARRIVED.

## 2014-11-08 ENCOUNTER — Telehealth: Payer: Self-pay | Admitting: *Deleted

## 2014-11-08 ENCOUNTER — Ambulatory Visit: Payer: Medicare Other | Admitting: Physical Therapy

## 2014-11-08 ENCOUNTER — Encounter: Payer: Medicare Other | Admitting: Occupational Therapy

## 2014-11-09 ENCOUNTER — Ambulatory Visit: Payer: Medicare Other | Admitting: Physical Therapy

## 2014-11-09 ENCOUNTER — Encounter: Payer: Medicare Other | Admitting: Occupational Therapy

## 2014-11-15 ENCOUNTER — Encounter: Payer: Medicare Other | Admitting: Occupational Therapy

## 2014-11-15 ENCOUNTER — Ambulatory Visit: Payer: Medicare Other | Admitting: Physical Therapy

## 2014-11-17 ENCOUNTER — Ambulatory Visit: Payer: Medicare Other | Admitting: Physical Therapy

## 2014-11-17 ENCOUNTER — Encounter: Payer: Medicare Other | Admitting: Occupational Therapy

## 2014-11-23 ENCOUNTER — Ambulatory Visit: Payer: Medicare Other | Admitting: Physical Therapy

## 2014-11-23 ENCOUNTER — Encounter: Payer: Medicare Other | Admitting: Occupational Therapy

## 2014-11-25 ENCOUNTER — Ambulatory Visit: Payer: Medicare Other | Admitting: Physical Therapy

## 2014-11-25 ENCOUNTER — Encounter: Payer: Medicare Other | Admitting: Occupational Therapy

## 2014-12-11 ENCOUNTER — Emergency Department (HOSPITAL_COMMUNITY)
Admission: EM | Admit: 2014-12-11 | Discharge: 2014-12-11 | Disposition: A | Discharge status: Home / Self Care | Payer: Medicare Other | Attending: Emergency Medicine | Admitting: Emergency Medicine

## 2014-12-11 ENCOUNTER — Encounter (HOSPITAL_COMMUNITY): Payer: Self-pay | Admitting: Emergency Medicine

## 2014-12-11 DIAGNOSIS — Z7982 Long term (current) use of aspirin: Secondary | ICD-10-CM | POA: Insufficient documentation

## 2014-12-11 DIAGNOSIS — G40909 Epilepsy, unspecified, not intractable, without status epilepticus: Secondary | ICD-10-CM | POA: Diagnosis not present

## 2014-12-11 DIAGNOSIS — Z79899 Other long term (current) drug therapy: Secondary | ICD-10-CM | POA: Insufficient documentation

## 2014-12-11 DIAGNOSIS — Z8679 Personal history of other diseases of the circulatory system: Secondary | ICD-10-CM | POA: Insufficient documentation

## 2014-12-11 DIAGNOSIS — M1990 Unspecified osteoarthritis, unspecified site: Secondary | ICD-10-CM | POA: Insufficient documentation

## 2014-12-11 DIAGNOSIS — R569 Unspecified convulsions: Secondary | ICD-10-CM

## 2014-12-11 DIAGNOSIS — Z8673 Personal history of transient ischemic attack (TIA), and cerebral infarction without residual deficits: Secondary | ICD-10-CM | POA: Insufficient documentation

## 2014-12-11 DIAGNOSIS — Z792 Long term (current) use of antibiotics: Secondary | ICD-10-CM | POA: Insufficient documentation

## 2014-12-11 LAB — CBC WITH DIFFERENTIAL/PLATELET
BASOS ABS: 0 10*3/uL (ref 0.0–0.1)
Basophils Relative: 0 % (ref 0–1)
EOS ABS: 0.1 10*3/uL (ref 0.0–0.7)
Eosinophils Relative: 2 % (ref 0–5)
HEMATOCRIT: 46.4 % — AB (ref 36.0–46.0)
Hemoglobin: 15.7 g/dL — ABNORMAL HIGH (ref 12.0–15.0)
Lymphocytes Relative: 18 % (ref 12–46)
Lymphs Abs: 1 10*3/uL (ref 0.7–4.0)
MCH: 30.7 pg (ref 26.0–34.0)
MCHC: 33.8 g/dL (ref 30.0–36.0)
MCV: 90.8 fL (ref 78.0–100.0)
MONO ABS: 0.4 10*3/uL (ref 0.1–1.0)
Monocytes Relative: 7 % (ref 3–12)
NEUTROS ABS: 4 10*3/uL (ref 1.7–7.7)
Neutrophils Relative %: 73 % (ref 43–77)
PLATELETS: 168 10*3/uL (ref 150–400)
RBC: 5.11 MIL/uL (ref 3.87–5.11)
RDW: 12 % (ref 11.5–15.5)
WBC: 5.6 10*3/uL (ref 4.0–10.5)

## 2014-12-11 LAB — URINE MICROSCOPIC-ADD ON

## 2014-12-11 LAB — BASIC METABOLIC PANEL
Anion gap: 8 (ref 5–15)
BUN: 12 mg/dL (ref 6–23)
CO2: 25 mmol/L (ref 19–32)
Calcium: 9.2 mg/dL (ref 8.4–10.5)
Chloride: 105 mmol/L (ref 96–112)
Creatinine, Ser: 0.94 mg/dL (ref 0.50–1.10)
GFR calc Af Amer: 67 mL/min — ABNORMAL LOW (ref >90)
GFR calc non Af Amer: 58 mL/min — ABNORMAL LOW (ref >90)
GLUCOSE: 126 mg/dL — AB (ref 70–99)
Potassium: 3.7 mmol/L (ref 3.5–5.1)
Sodium: 138 mmol/L (ref 135–145)

## 2014-12-11 LAB — URINALYSIS, ROUTINE W REFLEX MICROSCOPIC
GLUCOSE, UA: NEGATIVE mg/dL
HGB URINE DIPSTICK: NEGATIVE
KETONES UR: NEGATIVE mg/dL
Leukocytes, UA: NEGATIVE
Nitrite: NEGATIVE
PH: 5 (ref 5.0–8.0)
Protein, ur: 30 mg/dL — AB
SPECIFIC GRAVITY, URINE: 1.024 (ref 1.005–1.030)
UROBILINOGEN UA: 1 mg/dL (ref 0.0–1.0)

## 2014-12-11 MED ORDER — LEVETIRACETAM IN NACL 1000 MG/100ML IV SOLN
1000.0000 mg | INTRAVENOUS | Status: AC
  Administered 2014-12-11: 1000 mg via INTRAVENOUS
  Filled 2014-12-11: qty 100

## 2014-12-11 MED ORDER — SODIUM CHLORIDE 0.9 % IV SOLN
INTRAVENOUS | Status: DC
  Administered 2014-12-11: 14:00:00 via INTRAVENOUS

## 2014-12-11 NOTE — ED Provider Notes (Signed)
CSN: [id number]     Arrival date & time 12/11/14  1308 History   First MD Initiated Contact with Patient 12/11/14 1332     Chief Complaint  Patient presents with  . Seizures     (Consider location/radiation/quality/duration/timing/severity/associated sxs/prior Treatment) HPI   [name] is a 74 y.o. female who presents for evaluation of suspected seizure.  Her son had left the room where she was eating, went outside for about 4 minutes, came back and found her unconscious with her teeth clenched.  She remained lethargic/sleepy, for about 15 minutes when she began to wake up as an ambulance arrived.  He saw her, but this previously when she had a seizure about 5 years ago.  Her doctor took her off [place], about 2 months ago, because of cognitive dysfunction.  She also recently had a urinary tract infection.  There's been no other noted illness such as fever, chills, cough, shortness of breath, chest pain, nausea, vomiting.  The patient's son gives the bulk of the history.  The patient is able to communicate, and agrees with the son's statements.  She is taking all of her medicine as directed.  There are no other known modifying factors.   Past Medical History  Diagnosis Date  . Arthritis   . Atrial fibrillation   . CVA (cerebral infarction)   . Seizures    Past Surgical History  Procedure Laterality Date  . Total hip arthroplasty    . Joint replacement    . Fracture surgery     No family history on file. History  Substance Use Topics  . Smoking status: Never Smoker   . Smokeless tobacco: Not on file  . Alcohol Use: No   OB History    No data available     Review of Systems  All other systems reviewed and are negative.     Allergies  Review of patient's allergies indicates no known allergies.  Home Medications   Prior to Admission medications   Medication Sig Start Date End Date Taking? Authorizing Provider  acetaminophen (TYLENOL) 325 MG tablet Take 325 mg by  mouth every 6 (six) hours as needed for moderate pain.    Historical Provider, MD  aspirin EC 81 MG tablet Take 81 mg by mouth daily.      Historical Provider, MD  calcium elemental as carbonate (BARIATRIC TUMS ULTRA) 400 MG tablet Chew 1,000 mg by mouth 2 (two) times daily.      Historical Provider, MD  cephALEXin (KEFLEX) 500 MG capsule Take 1 capsule (500 mg total) by mouth 4 (four) times daily. Patient not taking: Reported on 11/06/2014 01/08/14   Linus Mako, PA-C  Cholecalciferol 1000 UNITS capsule Take 1,000 Units by mouth daily.    Historical Provider, MD  dabigatran (PRADAXA) 150 MG CAPS capsule Take 150 mg by mouth 2 (two) times daily.    Historical Provider, MD  esomeprazole (NEXIUM) 40 MG capsule Take 40 mg by mouth daily before breakfast.      Historical Provider, MD  furosemide (LASIX) 20 MG tablet Take 20 mg by mouth daily.      Historical Provider, MD  ketotifen (ZADITOR) 0.025 % ophthalmic solution Place 1 drop into both eyes daily as needed (for dry eyes).    Historical Provider, MD  levETIRAcetam (KEPPRA) 500 MG tablet Take 500 mg by mouth 2 (two) times daily.      Historical Provider, MD  levofloxacin (LEVAQUIN) 500 MG tablet Take 1.5 tablets (750 mg total)  by mouth daily. X 7 days 11/02/14   Maudry Diego, MD  LORazepam (ATIVAN) 1 MG tablet Take one pill at bed time for sleep and one every 8-12hours for aggitation 11/02/14   Maudry Diego, MD  metoprolol (LOPRESSOR) 50 MG tablet Take 50 mg by mouth daily.      Historical Provider, MD  niacin 500 MG tablet Take 500 mg by mouth daily with breakfast.      Historical Provider, MD  oxycodone (OXY-IR) 5 MG capsule Take 5 mg by mouth every 6 (six) hours as needed for pain.     Historical Provider, MD  potassium chloride (KLOR-CON) 10 MEQ CR tablet Take 10 mEq by mouth daily.      Historical Provider, MD  QUEtiapine (SEROQUEL) 25 MG tablet Take 25 mg by mouth at bedtime.  10/14/14 11/13/14  Historical Provider, MD  simvastatin  (ZOCOR) 40 MG tablet Take 40 mg by mouth at bedtime.      Historical Provider, MD  traMADol (ULTRAM-ER) 100 MG 24 hr tablet Take 100 mg by mouth daily as needed for pain.    Historical Provider, MD   BP 122/72 mmHg  Pulse 87  Temp(Src) 98 F (36.7 C) (Oral)  Resp 18  SpO2 100% Physical Exam  Constitutional: She appears well-developed.  Elderly, frail  HENT:  Head: Normocephalic and atraumatic.  Right Ear: External ear normal.  Left Ear: External ear normal.  Eyes: Conjunctivae and EOM are normal. Pupils are equal, round, and reactive to light.  Neck: Normal range of motion and phonation normal. Neck supple.  Cardiovascular: Normal rate, regular rhythm and normal heart sounds.   Pulmonary/Chest: Effort normal and breath sounds normal. She exhibits no bony tenderness.  Abdominal: Soft. There is no tenderness.  Musculoskeletal: Normal range of motion.  Neurological: She is alert. No cranial nerve deficit or sensory deficit. She exhibits normal muscle tone. Coordination normal.  No dysarthria or aphasia.  No facial asymmetry.  Normal strength.  Arms and legs bilaterally.  Skin: Skin is warm, dry and intact.  Psychiatric: She has a normal mood and affect. Her behavior is normal.  Nursing note and vitals reviewed.   ED Course  Procedures (including critical care time)  Medications  0.9 %  sodium chloride infusion (not administered)  levETIRAcetam (KEPPRA) IVPB 1000 mg/100 mL premix (not administered)    Patient Vitals for the past 24 hrs:  BP Temp Temp src Pulse Resp SpO2  12/11/14 1326 122/72 mmHg 98 F (36.7 C) Oral 87 18 100 %    3:37 PM Reevaluation with update and discussion. After initial assessment and treatment, an updated evaluation reveals she is comfortable now.  Findings discussed with patient's son, all questions answered.  He has a prescription for her usual dose of Keppra at home, and does not need a refill.Daleen Bo L   Labs Review Labs Reviewed  URINE  CULTURE  BASIC METABOLIC PANEL  CBC WITH DIFFERENTIAL/PLATELET  URINALYSIS, ROUTINE W REFLEX MICROSCOPIC    Imaging Review No results found.   EKG Interpretation   Date/Time:  Saturday December 11 2014 13:19:28 EST Ventricular Rate:  94 PR Interval:    QRS Duration: 102 QT Interval:  377 QTC Calculation: 471 R Axis:   126 Text Interpretation:  Atrial fibrillation Probable right ventricular  hypertrophy Borderline T abnormalities, diffuse leads Baseline wander in  lead(s) II III aVF since last tracing no significant change Confirmed by  Eulis Foster  MD, Navin Dogan ([id number]) on 12/11/2014 1:36:07 PM  MDM   Final diagnoses:  Seizure  Epilepsy without status epilepticus, not intractable    Seizure disorder, with recurrent seizure, off antiepileptics.  She is to restart her seizure medication, and follow-up with her care providers.  Nursing Notes Reviewed/ Care Coordinated Applicable Imaging Reviewed Interpretation of Laboratory Data incorporated into ED treatment  The patient appears reasonably screened and/or stabilized for discharge and I doubt any other medical condition or other [facility] requiring further screening, evaluation, or treatment in the ED at this time prior to discharge.  Plan: Home Medications- restart Keppra; Home Treatments- rest; return here if the recommended treatment, does not improve the symptoms; Recommended follow up- PCP 5-6 days     Richarda Blade, MD 12/11/14 1539

## 2014-12-11 NOTE — Discharge Instructions (Signed)
Restart your Keppra prescription, tomorrow. Follow-up with your primary care doctor in a few days for a checkup.    Epilepsy Epilepsy is a disorder in which a person has repeated seizures over time. A seizure is a release of abnormal electrical activity in the brain. Seizures can cause a change in attention, behavior, or the ability to remain awake and alert (altered mental status). Seizures often involve uncontrollable shaking (convulsions).  Most people with epilepsy lead normal lives. However, people with epilepsy are at an increased risk of falls, accidents, and injuries. Therefore, it is important to begin treatment right away. CAUSES  Epilepsy has many possible causes. Anything that disturbs the normal pattern of brain cell activity can lead to seizures. This may include:   Head injury.  Birth trauma.  High fever as a child.  Stroke.  Bleeding into or around the brain.  Certain drugs.  Prolonged low oxygen, such as what occurs after CPR efforts.  Abnormal brain development.  Certain illnesses, such as meningitis, encephalitis (brain infection), malaria, and other infections.  An imbalance of nerve signaling chemicals (neurotransmitters).  SIGNS AND SYMPTOMS  The symptoms of a seizure can vary greatly from one person to another. Right before a seizure, you may have a warning (aura) that a seizure is about to occur. An aura may include the following symptoms:  Fear or anxiety.  Nausea.  Feeling like the room is spinning (vertigo).  Vision changes, such as seeing flashing lights or spots. Common symptoms during a seizure include:  Abnormal sensations, such as an abnormal smell or a bitter taste in the mouth.   Sudden, general body stiffness.   Convulsions that involve rhythmic jerking of the face, arm, or leg on one or both sides.   Sudden change in consciousness.   Appearing to be awake but not responding.   Appearing to be asleep but cannot be awakened.    Grimacing, chewing, lip smacking, drooling, tongue biting, or loss of bowel or bladder control. After a seizure, you may feel sleepy for a while. DIAGNOSIS  Your health care provider will ask about your symptoms and take a medical history. Descriptions from any witnesses to your seizures will be very helpful in the diagnosis. A physical exam, including a detailed neurological exam, is necessary. Various tests may be done, such as:   An electroencephalogram (EEG). This is a painless test of your brain waves. In this test, a diagram is created of your brain waves. These diagrams can be interpreted by a specialist.  An MRI of the brain.   A CT scan of the brain.   A spinal tap (lumbar puncture, LP).  Blood tests to check for signs of infection or abnormal blood chemistry. TREATMENT  There is no cure for epilepsy, but it is generally treatable. Once epilepsy is diagnosed, it is important to begin treatment as soon as possible. For most people with epilepsy, seizures can be controlled with medicines. The following may also be used:  A pacemaker for the brain (vagus nerve stimulator) can be used for people with seizures that are not well controlled by medicine.  Surgery on the brain. For some people, epilepsy eventually goes away. HOME CARE INSTRUCTIONS   Follow your health care provider's recommendations on driving and safety in normal activities.  Get enough rest. Lack of sleep can cause seizures.  Only take over-the-counter or prescription medicines as directed by your health care provider. Take any prescribed medicine exactly as directed.  Avoid any known triggers of  your seizures.  Keep a seizure diary. Record what you recall about any seizure, especially any possible trigger.   Make sure the people you live and work with know that you are prone to seizures. They should receive instructions on how to help you. In general, a witness to a seizure should:   Cushion your head  and body.   Turn you on your side.   Avoid unnecessarily restraining you.   Not place anything inside your mouth.   Call for emergency medical help if there is any question about what has occurred.   Follow up with your health care provider as directed. You may need regular blood tests to monitor the levels of your medicine.  SEEK MEDICAL CARE IF:   You develop signs of infection or other illness. This might increase the risk of a seizure.   You seem to be having more frequent seizures.   Your seizure pattern is changing.  SEEK IMMEDIATE MEDICAL CARE IF:   You have a seizure that does not stop after a few moments.   You have a seizure that causes any difficulty in breathing.   You have a seizure that results in a very severe headache.   You have a seizure that leaves you with the inability to speak or use a part of your body.  Document Released: 11/05/2005 Document Revised: 08/26/2013 Document Reviewed: 06/17/2013 [facility] Patient Information 2015 [place], [place]. This information is not intended to replace advice given to you by your health care provider. Make sure you discuss any questions you have with your health care provider.  Seizure, Adult A seizure is abnormal electrical activity in the brain. Seizures usually last from 30 seconds to 2 minutes. There are various types of seizures. Before a seizure, you may have a warning sensation (aura) that a seizure is about to occur. An aura may include the following symptoms:   Fear or anxiety.  Nausea.  Feeling like the room is spinning (vertigo).  Vision changes, such as seeing flashing lights or spots. Common symptoms during a seizure include:  A change in attention or behavior (altered mental status).  Convulsions with rhythmic jerking movements.  Drooling.  Rapid eye movements.  Grunting.  Loss of bladder and bowel control.  Bitter taste in the mouth.  Tongue biting. After a seizure, you may  feel confused and sleepy. You may also have an injury resulting from convulsions during the seizure. HOME CARE INSTRUCTIONS   If you are given medicines, take them exactly as prescribed by your health care provider.  Keep all follow-up appointments as directed by your health care provider.  Do not swim or drive or engage in risky activity during which a seizure could cause further injury to you or others until your health care provider says it is OK.  Get adequate rest.  Teach friends and family what to do if you have a seizure. They should:  Lay you on the ground to prevent a fall.  Put a cushion under your head.  Loosen any tight clothing around your neck.  Turn you on your side. If vomiting occurs, this helps keep your airway clear.  Stay with you until you recover.  Know whether or not you need emergency care. SEEK IMMEDIATE MEDICAL CARE IF:  The seizure lasts longer than 5 minutes.  The seizure is severe or you do not wake up immediately after the seizure.  You have an altered mental status after the seizure.  You are having more frequent  or worsening seizures. Someone should drive you to the emergency department or call local emergency services (911 in [place] MAKE SURE YOU:  Understand these instructions.  Will watch your condition.  Will get help right away if you are not doing well or get worse. Document Released: 11/02/2000 Document Revised: 08/26/2013 Document Reviewed: 06/17/2013 [facility] Patient Information 2015 [place], [place]. This information is not intended to replace advice given to you by your health care provider. Make sure you discuss any questions you have with your health care provider.

## 2014-12-11 NOTE — ED Notes (Signed)
Meal tray ordered for pt  

## 2014-12-11 NOTE — ED Notes (Signed)
Pt put on bed pan and was unable to go

## 2014-12-11 NOTE — ED Notes (Signed)
Pt arrived from home by [facility] with c/o possible seizure. Pt son at bedside stated that pt had a seizure 6 years ago and was started on Keppra. Pt was recently seen 2 weeks ago by primary care doctor who stopped keppra because pt had only had 1 seizure. Seizure was unwitnessed but son stated that he walked in the room and she was not responding to him with her eyes rolled back in her head. Pt currently A&OX2. Hx of stroke that affected left side. Afib on the monitor. Son stated that pt is normally A&OX4. bp-137/72 hr-66-112 afib CBG-99 Pt denies any pain.

## 2014-12-15 LAB — URINE CULTURE: Colony Count: >=100000

## 2014-12-16 ENCOUNTER — Telehealth (HOSPITAL_BASED_OUTPATIENT_CLINIC_OR_DEPARTMENT_OTHER): Payer: Self-pay | Admitting: Emergency Medicine

## 2014-12-16 NOTE — Telephone Encounter (Signed)
Post ED Visit - Positive Culture Follow-up  Culture report reviewed by antimicrobial stewardship pharmacist: []  Wes [place], Pharm.D., BCPS [x]  Heide Guile, Pharm.D., BCPS []  Alycia Rossetti, Pharm.D., BCPS []  [place], [place].D., BCPS, AAHIVP []  Legrand Como, Pharm.D., BCPS, AAHIVP []  Isac Sarna, Pharm.D., BCPS  Positive urine culture Enterococcus Treated with doxycycline, organism sensitive to the same and no further patient follow-up is required at this time.  Hazle Nordmann 12/16/2014, 9:18 AM

## 2015-01-15 ENCOUNTER — Emergency Department: Payer: Self-pay | Admitting: Emergency Medicine

## 2015-02-14 ENCOUNTER — Encounter (HOSPITAL_COMMUNITY): Payer: Self-pay | Admitting: Emergency Medicine

## 2015-02-14 ENCOUNTER — Inpatient Hospital Stay (HOSPITAL_COMMUNITY): Payer: Medicare Other

## 2015-02-14 ENCOUNTER — Emergency Department (HOSPITAL_COMMUNITY): Payer: Medicare Other

## 2015-02-14 ENCOUNTER — Inpatient Hospital Stay (HOSPITAL_COMMUNITY)
Admission: EM | Admit: 2015-02-14 | Discharge: 2015-02-15 | DRG: 884 | Disposition: A | Discharge status: Home / Self Care | Payer: Medicare Other | Attending: Internal Medicine | Admitting: Internal Medicine

## 2015-02-14 DIAGNOSIS — I69354 Hemiplegia and hemiparesis following cerebral infarction affecting left non-dominant side: Secondary | ICD-10-CM | POA: Diagnosis not present

## 2015-02-14 DIAGNOSIS — F0391 Unspecified dementia with behavioral disturbance: Principal | ICD-10-CM | POA: Diagnosis present

## 2015-02-14 DIAGNOSIS — R4182 Altered mental status, unspecified: Secondary | ICD-10-CM | POA: Diagnosis not present

## 2015-02-14 DIAGNOSIS — Z7982 Long term (current) use of aspirin: Secondary | ICD-10-CM

## 2015-02-14 DIAGNOSIS — F0390 Unspecified dementia without behavioral disturbance: Secondary | ICD-10-CM | POA: Diagnosis present

## 2015-02-14 DIAGNOSIS — Z96649 Presence of unspecified artificial hip joint: Secondary | ICD-10-CM | POA: Diagnosis present

## 2015-02-14 DIAGNOSIS — Z7901 Long term (current) use of anticoagulants: Secondary | ICD-10-CM

## 2015-02-14 DIAGNOSIS — I639 Cerebral infarction, unspecified: Secondary | ICD-10-CM | POA: Diagnosis present

## 2015-02-14 DIAGNOSIS — E785 Hyperlipidemia, unspecified: Secondary | ICD-10-CM | POA: Diagnosis present

## 2015-02-14 DIAGNOSIS — M79609 Pain in unspecified limb: Secondary | ICD-10-CM

## 2015-02-14 DIAGNOSIS — M7989 Other specified soft tissue disorders: Secondary | ICD-10-CM

## 2015-02-14 DIAGNOSIS — M1990 Unspecified osteoarthritis, unspecified site: Secondary | ICD-10-CM | POA: Diagnosis present

## 2015-02-14 DIAGNOSIS — Z87898 Personal history of other specified conditions: Secondary | ICD-10-CM

## 2015-02-14 DIAGNOSIS — R569 Unspecified convulsions: Secondary | ICD-10-CM

## 2015-02-14 DIAGNOSIS — K219 Gastro-esophageal reflux disease without esophagitis: Secondary | ICD-10-CM | POA: Diagnosis not present

## 2015-02-14 DIAGNOSIS — I4891 Unspecified atrial fibrillation: Secondary | ICD-10-CM | POA: Diagnosis present

## 2015-02-14 DIAGNOSIS — I482 Chronic atrial fibrillation: Secondary | ICD-10-CM | POA: Diagnosis not present

## 2015-02-14 DIAGNOSIS — F05 Delirium due to known physiological condition: Secondary | ICD-10-CM | POA: Diagnosis present

## 2015-02-14 DIAGNOSIS — Z8673 Personal history of transient ischemic attack (TIA), and cerebral infarction without residual deficits: Secondary | ICD-10-CM

## 2015-02-14 DIAGNOSIS — I6310 Cerebral infarction due to embolism of unspecified precerebral artery: Secondary | ICD-10-CM | POA: Diagnosis not present

## 2015-02-14 HISTORY — DX: Unspecified atrial fibrillation: I48.91

## 2015-02-14 HISTORY — DX: Gastro-esophageal reflux disease without esophagitis: K21.9

## 2015-02-14 HISTORY — DX: Cerebral infarction, unspecified: I63.9

## 2015-02-14 HISTORY — DX: Unspecified convulsions: R56.9

## 2015-02-14 LAB — URINALYSIS, ROUTINE W REFLEX MICROSCOPIC
Bilirubin Urine: NEGATIVE
Glucose, UA: NEGATIVE mg/dL
Hgb urine dipstick: NEGATIVE
Ketones, ur: NEGATIVE mg/dL
LEUKOCYTES UA: NEGATIVE
Nitrite: NEGATIVE
Protein, ur: NEGATIVE mg/dL
SPECIFIC GRAVITY, URINE: 1.015 (ref 1.005–1.030)
UROBILINOGEN UA: 1 mg/dL (ref 0.0–1.0)
pH: 6 (ref 5.0–8.0)

## 2015-02-14 LAB — COMPREHENSIVE METABOLIC PANEL
ALBUMIN: 3.3 g/dL — AB (ref 3.5–5.2)
ALT: 24 U/L (ref 0–35)
AST: 31 U/L (ref 0–37)
Alkaline Phosphatase: 87 U/L (ref 39–117)
Anion gap: 7 (ref 5–15)
BUN: 16 mg/dL (ref 6–23)
CO2: 24 mmol/L (ref 19–32)
CREATININE: 0.84 mg/dL (ref 0.50–1.10)
Calcium: 9.2 mg/dL (ref 8.4–10.5)
Chloride: 106 mmol/L (ref 96–112)
GFR calc non Af Amer: 66 mL/min — ABNORMAL LOW (ref >90)
GFR, EST AFRICAN AMERICAN: 77 mL/min — AB (ref >90)
Glucose, Bld: 98 mg/dL (ref 70–99)
Potassium: 4.5 mmol/L (ref 3.5–5.1)
Sodium: 137 mmol/L (ref 135–145)
Total Bilirubin: 1.1 mg/dL (ref 0.3–1.2)
Total Protein: 7.2 g/dL (ref 6.0–8.3)

## 2015-02-14 LAB — CBC WITH DIFFERENTIAL/PLATELET
BASOS ABS: 0 10*3/uL (ref 0.0–0.1)
BASOS PCT: 1 % (ref 0–1)
EOS PCT: 2 % (ref 0–5)
Eosinophils Absolute: 0.1 10*3/uL (ref 0.0–0.7)
HEMATOCRIT: 43 % (ref 36.0–46.0)
Hemoglobin: 14.3 g/dL (ref 12.0–15.0)
Lymphocytes Relative: 26 % (ref 12–46)
Lymphs Abs: 1.5 10*3/uL (ref 0.7–4.0)
MCH: 31.1 pg (ref 26.0–34.0)
MCHC: 33.3 g/dL (ref 30.0–36.0)
MCV: 93.5 fL (ref 78.0–100.0)
MONO ABS: 0.7 10*3/uL (ref 0.1–1.0)
Monocytes Relative: 12 % (ref 3–12)
NEUTROS ABS: 3.3 10*3/uL (ref 1.7–7.7)
Neutrophils Relative %: 59 % (ref 43–77)
PLATELETS: 181 10*3/uL (ref 150–400)
RBC: 4.6 MIL/uL (ref 3.87–5.11)
RDW: 12 % (ref 11.5–15.5)
WBC: 5.6 10*3/uL (ref 4.0–10.5)

## 2015-02-14 LAB — I-STAT CG4 LACTIC ACID, ED: LACTIC ACID, VENOUS: 2.38 mmol/L — AB (ref 0.5–2.0)

## 2015-02-14 LAB — I-STAT TROPONIN, ED: TROPONIN I, POC: 0.02 ng/mL (ref 0.00–0.08)

## 2015-02-14 MED ORDER — HYDROCODONE-ACETAMINOPHEN 5-325 MG PO TABS: 1.0000 | ORAL_TABLET | ORAL | Status: DC | PRN

## 2015-02-14 MED ORDER — GUAIFENESIN-DM 100-10 MG/5ML PO SYRP
5.0000 mL | ORAL_SOLUTION | ORAL | Status: DC | PRN
  Filled 2015-02-14: qty 5

## 2015-02-14 MED ORDER — PANTOPRAZOLE SODIUM 40 MG PO TBEC
40.0000 mg | DELAYED_RELEASE_TABLET | Freq: Every day | ORAL | Status: DC
  Administered 2015-02-15: 40 mg via ORAL

## 2015-02-14 MED ORDER — KETOTIFEN FUMARATE 0.025 % OP SOLN
1.0000 [drp] | Freq: Every day | OPHTHALMIC | Status: DC | PRN
  Filled 2015-02-14: qty 5

## 2015-02-14 MED ORDER — ASPIRIN EC 81 MG PO TBEC
81.0000 mg | DELAYED_RELEASE_TABLET | Freq: Every day | ORAL | Status: DC
Start: 2015-02-15 — End: 2015-02-15
  Administered 2015-02-15: 81 mg via ORAL
  Filled 2015-02-14: qty 1

## 2015-02-14 MED ORDER — SIMVASTATIN 40 MG PO TABS
40.0000 mg | ORAL_TABLET | Freq: Every day | ORAL | Status: DC
  Administered 2015-02-14: 40 mg via ORAL
  Filled 2015-02-14 (×2): qty 1

## 2015-02-14 MED ORDER — SODIUM CHLORIDE 0.9 % IJ SOLN
3.0000 mL | Freq: Two times a day (BID) | INTRAMUSCULAR | Status: DC
  Administered 2015-02-14 – 2015-02-15 (×2): 3 mL via INTRAVENOUS

## 2015-02-14 MED ORDER — LORAZEPAM 2 MG/ML IJ SOLN: 1.0000 mg | Freq: Once | INTRAMUSCULAR | Status: DC

## 2015-02-14 MED ORDER — ALUM & MAG HYDROXIDE-SIMETH 200-200-20 MG/5ML PO SUSP: 30.0000 mL | Freq: Four times a day (QID) | ORAL | Status: DC | PRN

## 2015-02-14 MED ORDER — DABIGATRAN ETEXILATE MESYLATE 150 MG PO CAPS
150.0000 mg | ORAL_CAPSULE | Freq: Two times a day (BID) | ORAL | Status: DC
  Administered 2015-02-14 – 2015-02-15 (×2): 150 mg via ORAL
  Filled 2015-02-14 (×5): qty 1

## 2015-02-14 MED ORDER — LORAZEPAM 0.5 MG PO TABS: 0.5000 mg | ORAL_TABLET | Freq: Four times a day (QID) | ORAL | Status: DC | PRN

## 2015-02-14 MED ORDER — ACETAMINOPHEN 325 MG PO TABS
325.0000 mg | ORAL_TABLET | Freq: Four times a day (QID) | ORAL | Status: DC | PRN
  Administered 2015-02-15: 325 mg via ORAL
  Filled 2015-02-14: qty 1

## 2015-02-14 MED ORDER — POLYETHYLENE GLYCOL 3350 17 G PO PACK
17.0000 g | PACK | Freq: Every day | ORAL | Status: DC
  Administered 2015-02-14 – 2015-02-15 (×2): 17 g via ORAL
  Filled 2015-02-14 (×2): qty 1

## 2015-02-14 MED ORDER — ACETAMINOPHEN 325 MG PO TABS: 325.0000 mg | ORAL_TABLET | Freq: Four times a day (QID) | ORAL | Status: DC | PRN

## 2015-02-14 MED ORDER — METOPROLOL TARTRATE 50 MG PO TABS
50.0000 mg | ORAL_TABLET | Freq: Every day | ORAL | Status: DC
  Administered 2015-02-15: 50 mg via ORAL
  Filled 2015-02-14: qty 1

## 2015-02-14 MED ORDER — FUROSEMIDE 20 MG PO TABS
20.0000 mg | ORAL_TABLET | Freq: Every day | ORAL | Status: DC
  Administered 2015-02-15: 20 mg via ORAL
  Filled 2015-02-14: qty 1

## 2015-02-14 MED ORDER — LEVETIRACETAM 500 MG PO TABS
500.0000 mg | ORAL_TABLET | Freq: Two times a day (BID) | ORAL | Status: DC
  Administered 2015-02-14 – 2015-02-15 (×2): 500 mg via ORAL
  Filled 2015-02-14 (×5): qty 1

## 2015-02-14 MED ORDER — QUETIAPINE FUMARATE 50 MG PO TABS
50.0000 mg | ORAL_TABLET | Freq: Every day | ORAL | Status: DC
  Administered 2015-02-14: 50 mg via ORAL
  Filled 2015-02-14 (×2): qty 1

## 2015-02-14 MED ORDER — ONDANSETRON HCL 4 MG/2ML IJ SOLN: 4.0000 mg | Freq: Four times a day (QID) | INTRAMUSCULAR | Status: DC | PRN

## 2015-02-14 MED ORDER — NIACIN 500 MG PO TABS
500.0000 mg | ORAL_TABLET | Freq: Every day | ORAL | Status: DC
  Administered 2015-02-15: 500 mg via ORAL
  Filled 2015-02-14 (×2): qty 1

## 2015-02-14 MED ORDER — SODIUM CHLORIDE 0.9 % IV BOLUS (SEPSIS)
500.0000 mL | Freq: Once | INTRAVENOUS | Status: AC
  Administered 2015-02-14: 500 mL via INTRAVENOUS

## 2015-02-14 MED ORDER — ONDANSETRON HCL 4 MG PO TABS: 4.0000 mg | ORAL_TABLET | Freq: Four times a day (QID) | ORAL | Status: DC | PRN

## 2015-02-14 NOTE — ED Notes (Signed)
PA Creech at bedside with patient and family.

## 2015-02-14 NOTE — Progress Notes (Signed)
VASCULAR LAB PRELIMINARY  PRELIMINARY  PRELIMINARY  PRELIMINARY  Left lower extremity venous duplex completed.    Preliminary report:  Left:  No evidence of DVT, superficial thrombosis, or Baker's cyst.  [name], RVS 02/14/2015, 4:42 PM

## 2015-02-14 NOTE — Progress Notes (Signed)
Patient back from MRI, Monitor room notified of patient move from room 18 to room 16 so can be closer to nurses station. SR up, call bell in reach. Denies pain or problems, family member at bedside. Will monitor.

## 2015-02-14 NOTE — ED Provider Notes (Signed)
Patient seen/examined in the Emergency Department in conjunction with Midlevel Provider [facility] Patient presents for increased confusion concern for UTI Exam : pt is awake/alert, no distress.  She follow commands Plan: workup pending at this time  BP 114/62 mmHg  Pulse 50  Temp(Src) 98.1 F (36.7 C) (Oral)  Resp 15  SpO2 98%   Ripley Fraise, MD 02/14/15 1510

## 2015-02-14 NOTE — Progress Notes (Signed)
ANTICOAGULATION CONSULT NOTE - Initial Consult  Pharmacy Consult for Pradaxa  Indication: atrial fibrillation  No Known Allergies  Patient Measurements:   Heparin Dosing Weight: n/a   Vital Signs: Temp: 98.1 F (36.7 C) (03/28 1358) Temp Source: Oral (03/28 1358) BP: 144/73 mmHg (03/28 1901) Pulse Rate: 65 (03/28 1901)  Labs:  Recent Labs  02/14/15 1432  HGB 14.3  HCT 43.0  PLT 181  CREATININE 0.84    CrCl cannot be calculated (Unknown ideal weight.).   Medical History: Past Medical History  Diagnosis Date  . Arthritis   . Atrial fibrillation   . CVA (cerebral infarction)   . Seizures   . A-fib 02/14/2015  . CVA (cerebral infarction) 02/14/2015  . GERD (gastroesophageal reflux disease) 02/14/2015  . GERD without esophagitis 02/14/2015  . Seizure 02/14/2015    Medications:   (Not in a hospital admission)  Assessment: 73 YOF with chronic afib on Pradaxa prior to admission with CC of mild left-sided hemiparesis. Pharmacy consulted to resume anticoagulation. SCr is at baseline at ~ 0.84. CBC wnl. Patient's last dose of apixaban was this morning.   Goal of Therapy:  Stroke prevention  Monitor platelets by anticoagulation protocol: Yes   Plan:  Resume Pradaxa 150 mg twice daily  Monitor CBC and s/s of bleeding   Albertina Parr, PharmD., BCPS Clinical Pharmacist Pager [phone]

## 2015-02-14 NOTE — Progress Notes (Signed)
Received patient to room 18 from ED via stretcher, transferred patient from stretcher to bed. SR up, call bell placed in reach, bed alarm activated. Patient alert to person, place, otherwise confused. Tele monitor 7 placed and working properly. Noted IV to RFA, saline locked, flushes well. Unable to complete admission paperwork due to confusion and no family member present. Patient oriented as well as could be to bed controls, room, call bell at this time. Denies pain. Reviewed patient handbook with patient but unsure of understanding. Will monitor.

## 2015-02-14 NOTE — H&P (Addendum)
Patient Demographics  [name], is a 74 y.o. female  MRN: [id number]   DOB - 01-05-1941  Admit Date - 02/14/2015  Outpatient Primary MD for the patient is [facility], [place]   With History of -  Past Medical History  Diagnosis Date  . Arthritis   . Atrial fibrillation   . CVA (cerebral infarction)   . Seizures   . A-fib 02/14/2015  . CVA (cerebral infarction) 02/14/2015  . GERD (gastroesophageal reflux disease) 02/14/2015  . GERD without esophagitis 02/14/2015  . Seizure 02/14/2015      Past Surgical History  Procedure Laterality Date  . Total hip arthroplasty    . Joint replacement    . Fracture surgery      in for   Chief Complaint  Patient presents with  . Altered Mental Status  . Urinary Tract Infection     HPI  [name]  is a 74 y.o. female, is triaged CVA causing mild left-sided hemiparesis and facial droop in the past, chronic atrial fibrillation on anticoagulation, GERD, seizures, possible early dementia who lives with her son for for the last 6 years has been brought in by the son for gradually progressive confusion, according to the son he has noticed a distinct change in her mother since November about 4 months ago, she has been gradually getting little confused however over the last 2 weeks she has been more confused, at times aggressive.  She was brought to the ER where he was initial blood work, UA, EKG, head CT were all nonacute. Her exam was nonfocal. She was awake, mildly delirious, oriented 2 and I was called to admit the patient for decreased mental status.    Review of Systems  per patient besides left leg swelling she is feeling fine  In addition to the HPI above,   No Fever-chills, No Headache, No changes with Vision or hearing, No problems swallowing food  or Liquids, No Chest pain, Cough or Shortness of Breath, No Abdominal pain, No Nausea or Vommitting, Bowel movements are regular, No Blood in stool or Urine, No dysuria, No new skin rashes or bruises, No new joints pains-aches,  No new weakness, tingling, numbness in any extremity, No recent weight gain or loss, No polyuria, polydypsia or polyphagia, No significant Mental Stressors.  A full 10 point Review of Systems was done, except as stated above, all other Review of Systems were negative.   Social History History  Substance Use Topics  . Smoking status: Never Smoker   . Smokeless tobacco: Not on file  . Alcohol Use: No      Family History No CAD at an young age  Prior to Admission medications   Medication Sig Start Date End Date Taking? Authorizing Provider  acetaminophen (TYLENOL) 325 MG tablet Take 325 mg by mouth every 6 (six) hours as needed for moderate pain.   Yes Historical Provider, MD  aspirin EC 81  MG tablet Take 81 mg by mouth daily.     Yes Historical Provider, MD  calcium elemental as carbonate (BARIATRIC TUMS ULTRA) 400 MG tablet Chew 1,000 mg by mouth 2 (two) times daily.     Yes Historical Provider, MD  Cholecalciferol 1000 UNITS capsule Take 1,000 Units by mouth daily.   Yes Historical Provider, MD  dabigatran (PRADAXA) 150 MG CAPS capsule Take 150 mg by mouth 2 (two) times daily.   Yes Historical Provider, MD  esomeprazole (NEXIUM) 40 MG capsule Take 40 mg by mouth daily before breakfast.     Yes Historical Provider, MD  furosemide (LASIX) 20 MG tablet Take 20 mg by mouth daily.     Yes Historical Provider, MD  ketotifen (ZADITOR) 0.025 % ophthalmic solution Place 1 drop into both eyes daily as needed (for dry eyes).   Yes Historical Provider, MD  levETIRAcetam (KEPPRA) 500 MG tablet Take 500 mg by mouth 2 (two) times daily.     Yes Historical Provider, MD  metoprolol (LOPRESSOR) 50 MG tablet Take 50 mg by mouth daily.     Yes Historical Provider, MD    niacin 500 MG tablet Take 500 mg by mouth daily with breakfast.     Yes Historical Provider, MD  oxycodone (OXY-IR) 5 MG capsule Take 5 mg by mouth every 6 (six) hours as needed for pain.    Yes Historical Provider, MD  potassium chloride (KLOR-CON) 10 MEQ CR tablet Take 10 mEq by mouth daily.     Yes Historical Provider, MD  QUEtiapine (SEROQUEL) 25 MG tablet Take 50 mg by mouth at bedtime.  10/14/14 02/14/15 Yes Historical Provider, MD  simvastatin (ZOCOR) 40 MG tablet Take 40 mg by mouth at bedtime.     Yes Historical Provider, MD  levofloxacin (LEVAQUIN) 500 MG tablet Take 1.5 tablets (750 mg total) by mouth daily. X 7 days 11/02/14   Milton Ferguson, MD  LORazepam (ATIVAN) 1 MG tablet Take one pill at bed time for sleep and one every 8-12hours for aggitation 11/02/14   Milton Ferguson, MD    No Known Allergies  Physical Exam  Vitals  Blood pressure 135/83, pulse 62, temperature 98.1 F (36.7 C), temperature source Oral, resp. rate 16, SpO2 100 %.   1. General obese elderly African-American female lying in bed in NAD, mildly confused and mildly delirious  2. Normal affect and insight, Not Suicidal or Homicidal, Awake, mildly confused, Oriented X2.  3. No F.N deficits, ALL C.Nerves Intact, Strength 5/5 in right sided, 4/5 in left sided extremities, Sensation intact all 4 extremities, Plantars down going. Mild facial droop on the left  4. Ears and Eyes appear Normal, Conjunctivae clear, PERRLA. Moist Oral Mucosa.  5. Supple Neck, No JVD, No cervical lymphadenopathy appriciated, No Carotid Bruits.  6. Symmetrical Chest wall movement, Good air movement bilaterally, CTAB.  7. RRR, No Gallops, Rubs or Murmurs, No Parasternal Heave.  8. Positive Bowel Sounds, Abdomen Soft, No tenderness, No organomegaly appriciated,No rebound -guarding or rigidity.  9.  No Cyanosis, Normal Skin Turgor, No Skin Rash or Bruise. Left leg swollen as compared to right  10. Good muscle tone,  joints appear  normal , no effusions, Normal ROM.  11. No Palpable Lymph Nodes in Neck or Axillae     Data Review  CBC  Recent Labs Lab 02/14/15 1432  WBC 5.6  HGB 14.3  HCT 43.0  PLT 181  MCV 93.5  MCH 31.1  MCHC 33.3  RDW 12.0  LYMPHSABS  1.5  MONOABS 0.7  EOSABS 0.1  BASOSABS 0.0   ------------------------------------------------------------------------------------------------------------------  Chemistries   Recent Labs Lab 02/14/15 1432  NA 137  K 4.5  CL 106  CO2 24  GLUCOSE 98  BUN 16  CREATININE 0.84  CALCIUM 9.2  AST 31  ALT 24  ALKPHOS 87  BILITOT 1.1   ------------------------------------------------------------------------------------------------------------------ CrCl cannot be calculated (Unknown ideal weight.). ------------------------------------------------------------------------------------------------------------------ No results for input(s): TSH, T4TOTAL, T3FREE, THYROIDAB in the last 72 hours.  Invalid input(s): FREET3   Coagulation profile No results for input(s): INR, PROTIME in the last 168 hours. ------------------------------------------------------------------------------------------------------------------- No results for input(s): DDIMER in the last 72 hours. -------------------------------------------------------------------------------------------------------------------  Cardiac Enzymes No results for input(s): CKMB, TROPONINI, MYOGLOBIN in the last 168 hours.  Invalid input(s): CK ------------------------------------------------------------------------------------------------------------------ Invalid input(s): POCBNP   ---------------------------------------------------------------------------------------------------------------  Urinalysis    Component Value Date/Time   COLORURINE YELLOW 02/14/2015 1603   APPEARANCEUR CLEAR 02/14/2015 1603   LABSPEC 1.015 02/14/2015 1603   PHURINE 6.0 02/14/2015 1603   GLUCOSEU NEGATIVE  02/14/2015 1603   HGBUR NEGATIVE 02/14/2015 1603   BILIRUBINUR NEGATIVE 02/14/2015 1603   KETONESUR NEGATIVE 02/14/2015 1603   PROTEINUR NEGATIVE 02/14/2015 1603   UROBILINOGEN 1.0 02/14/2015 1603   NITRITE NEGATIVE 02/14/2015 1603   LEUKOCYTESUR NEGATIVE 02/14/2015 1603    ----------------------------------------------------------------------------------------------------------------  Imaging results:   Dg Chest 2 View  02/14/2015   CLINICAL DATA:  Altered mental status and urinary tract infection  EXAM: CHEST  2 VIEW  COMPARISON:  11/06/2014  FINDINGS: Low lung volumes without visible pneumonia or edema. Mild scar over the left diaphragm is again noted. No effusion or pneumothorax. Mild cardiomegaly, accentuated by technique. Negative aortic and hilar contours.  IMPRESSION: Stable low volume chest.   Electronically Signed   By: Monte Fantasia M.D.   On: 02/14/2015 15:32   Ct Head Wo Contrast  02/14/2015   CLINICAL DATA:  Altered mental status, UTI  EXAM: CT HEAD WITHOUT CONTRAST  TECHNIQUE: Contiguous axial images were obtained from the base of the skull through the vertex without intravenous contrast.  COMPARISON:  11/02/2014  FINDINGS: There is no evidence of mass effect, midline shift, or extra-axial fluid collections. There is no evidence of a space-occupying lesion or intracranial hemorrhage. There is no evidence of a cortical-based area of acute infarction. There is an all right MCA territory infarct with encephalomalacia. There is generalized cerebral atrophy. There is periventricular white matter low attenuation likely secondary to microangiopathy.  The ventricles and sulci are appropriate for the patient's age. The basal cisterns are patent.  Visualized portions of the orbits are unremarkable. The visualized portions of the paranasal sinuses and mastoid air cells are unremarkable. Cerebrovascular atherosclerotic calcifications are noted.  The osseous structures are unremarkable.   IMPRESSION: No acute intracranial pathology.   Electronically Signed   By: Kathreen Devoid   On: 02/14/2015 16:02    My personal review of EKG: Rhythm Afib, Rate  53 /min,   no Acute ST changes    Assessment & Plan   1. Decreased mental status. Likely gradual progression of underlying dementia with delirium. However another subacute stroke cannot be ruled out around the timeframe of November. She will be admitted to a telemetry bed, will check MRI of the brain, she also has underlying seizures therefore we will check Keppra level, neurology has been consulted by EDP - Dr Aram Beecham.    She has no focal deficits. We will also initiate a dementia workup with B-12, folate, TSH, HIV, RPR. Continue  oral anti-coag lesion for A. fib to be dosed by pharmacy. Family has been updated that she is at risk for worsening delirium and might require placement.    2. Atrial fibrillation.CHADS2 Vasc score is 5 or above - continue beta blocker along with Pradaxa to be told by pharmacy.    3. GERD. Continue PPI.    4. 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 For now continue low-dose aspirin, statin along with anticoagulation as per home dose for secondary prevention, will have PT evaluate the patient, checking MRI as in #1 above. Neuro to see.    5. Swelling of the left leg. Cause unclear, lower extremity venous duplex unremarkable. Will request PCP to do outpatient workup. No injuries to the left leg. No pain or discomfort to the patient.    6. Underlying dementia. With delirium. Continue Seroquel, avoid benzodiazepines over use and narcotic overuse.    7. Dyslipidemia. Continue home dose statin.    8.H/O seizures - On Keppra, check level.        DVT Prophylaxis  Pradaxa  AM Labs Ordered, also please review Full Orders  Family Communication: Admission, patients condition and plan of care including tests being ordered have been discussed with the patient and son  who indicate understanding and agree with the plan and Code Status.  Code Status Full  Likely DC to  Home  Condition GUARDED    Time spent in minutes :35    Cayla Wiegand K M.D on 02/14/2015 at 6:56 PM  Between 7am to 7pm - Pager - [phone]  After 7pm go to [email or web address] - password [facility]  [facility]  Office  [phone]

## 2015-02-14 NOTE — ED Notes (Signed)
EMS - Patient coming from home with increased confusion.  Patient is on Lasix, "I don't like the makes me have to go".  Patient has been holding urine.

## 2015-02-14 NOTE — ED Provider Notes (Signed)
CSN: [id number]     Arrival date & time 02/14/15  1344 History   First MD Initiated Contact with Patient 02/14/15 1355     Chief Complaint  Patient presents with  . Altered Mental Status  . Urinary Tract Infection     (Consider location/radiation/quality/duration/timing/severity/associated sxs/prior Treatment) HPI  [name] is a 74 y.o. female with PMH of right CVA, atrial fibrillation on pradaxa presenting with AMS. Most of history obtained from son who is bedside. Pt diagnosed with "delirium" what often is related to UTI. Pt recently completed doxycycline course. Pt son states she doesn't have typical symptoms except for foul urine which he noticed. Pt also with decreased urine output. Pt denies chest pain or SOB. Son reports increased pain of left foot with swelling. No history of DVT, PE, exogenous estrogen, malignancy. Patient with history of immobilization. Pt denies history of recent surgery or trauma, malignancy, hemoptysis. Pt followed by [facility] with most recent head CT 09/2014 which was unremarkable for acute abnormalities.   Past Medical History  Diagnosis Date  . Arthritis   . Atrial fibrillation   . CVA (cerebral infarction)   . Seizures    Past Surgical History  Procedure Laterality Date  . Total hip arthroplasty    . Joint replacement    . Fracture surgery     No family history on file. History  Substance Use Topics  . Smoking status: Never Smoker   . Smokeless tobacco: Not on file  . Alcohol Use: No   OB History    No data available     Review of Systems 10 Systems reviewed and are negative for acute change except as noted in the HPI.    Allergies  Review of patient's allergies indicates no known allergies.  Home Medications   Prior to Admission medications   Medication Sig Start Date End Date Taking? Authorizing Provider  acetaminophen (TYLENOL) 325 MG tablet Take 325 mg by mouth every 6 (six) hours as needed for moderate pain.   Yes Historical  Provider, MD  aspirin EC 81 MG tablet Take 81 mg by mouth daily.     Yes Historical Provider, MD  dabigatran (PRADAXA) 150 MG CAPS capsule Take 150 mg by mouth 2 (two) times daily.   Yes Historical Provider, MD  esomeprazole (NEXIUM) 40 MG capsule Take 40 mg by mouth daily before breakfast.     Yes Historical Provider, MD  furosemide (LASIX) 20 MG tablet Take 20 mg by mouth daily.     Yes Historical Provider, MD  levETIRAcetam (KEPPRA) 500 MG tablet Take 500 mg by mouth 2 (two) times daily.     Yes Historical Provider, MD  metoprolol (LOPRESSOR) 50 MG tablet Take 50 mg by mouth daily.     Yes Historical Provider, MD  niacin 500 MG tablet Take 500 mg by mouth daily with breakfast.     Yes Historical Provider, MD  potassium chloride (KLOR-CON) 10 MEQ CR tablet Take 10 mEq by mouth daily.     Yes Historical Provider, MD  QUEtiapine (SEROQUEL) 25 MG tablet Take 25 mg by mouth as needed (for agitation).  10/14/14 02/14/15 Yes Historical Provider, MD  simvastatin (ZOCOR) 40 MG tablet Take 40 mg by mouth at bedtime.     Yes Historical Provider, MD  traMADol (ULTRAM-ER) 100 MG 24 hr tablet Take 100 mg by mouth daily as needed for pain.   Yes Historical Provider, MD  calcium elemental as carbonate (BARIATRIC TUMS ULTRA) 400 MG tablet Chew  1,000 mg by mouth 2 (two) times daily.      Historical Provider, MD  Cholecalciferol 1000 UNITS capsule Take 1,000 Units by mouth daily.    Historical Provider, MD  ketotifen (ZADITOR) 0.025 % ophthalmic solution Place 1 drop into both eyes daily as needed (for dry eyes).    Historical Provider, MD  levofloxacin (LEVAQUIN) 500 MG tablet Take 1.5 tablets (750 mg total) by mouth daily. X 7 days 11/02/14   Milton Ferguson, MD  LORazepam (ATIVAN) 1 MG tablet Take one pill at bed time for sleep and one every 8-12hours for aggitation 11/02/14   Milton Ferguson, MD  oxycodone (OXY-IR) 5 MG capsule Take 5 mg by mouth every 6 (six) hours as needed for pain.     Historical Provider, MD   QUEtiapine (SEROQUEL) 50 MG tablet Take 50 mg by mouth at bedtime.    Historical Provider, MD   BP 114/62 mmHg  Pulse 50  Temp(Src) 98.1 F (36.7 C) (Oral)  Resp 15  SpO2 98% Physical Exam  Constitutional: She appears well-developed and well-nourished. No distress.  HENT:  Head: Normocephalic and atraumatic.  Mouth/Throat: Oropharynx is clear and moist.  Eyes: Conjunctivae and EOM are normal. Pupils are equal, round, and reactive to light. Right eye exhibits no discharge. Left eye exhibits no discharge.  Neck: Normal range of motion. Neck supple.  No nuchal rigidity  Cardiovascular: Normal rate and regular rhythm.   Pulmonary/Chest: Effort normal and breath sounds normal. No respiratory distress. She has no wheezes.  Abdominal: Soft. Bowel sounds are normal. She exhibits no distension. There is no tenderness.  Neurological: She is alert. No cranial nerve deficit. Coordination normal.  Speech is clear and goal oriented. Strength 5/5 in upper and lower extremities. Sensation intact. Intact rapid alternating movements, finger to nose, and heel to shin. Gait deferred due to left leg pain.   Skin: Skin is warm and dry. She is not diaphoretic.  Nursing note and vitals reviewed.   ED Course  Procedures (including critical care time) Labs Review Labs Reviewed  COMPREHENSIVE METABOLIC PANEL - Abnormal; Notable for the following:    Albumin 3.3 (*)    GFR calc non Af Amer 66 (*)    GFR calc Af Amer 77 (*)    All other components within normal limits  I-STAT CG4 LACTIC ACID, ED - Abnormal; Notable for the following:    Lactic Acid, Venous 2.38 (*)    All other components within normal limits  URINE CULTURE  CBC WITH DIFFERENTIAL/PLATELET  URINALYSIS, ROUTINE W REFLEX MICROSCOPIC  I-STAT TROPOININ, ED    Imaging Review Dg Chest 2 View  02/14/2015   CLINICAL DATA:  Altered mental status and urinary tract infection  EXAM: CHEST  2 VIEW  COMPARISON:  11/06/2014  FINDINGS: Low lung  volumes without visible pneumonia or edema. Mild scar over the left diaphragm is again noted. No effusion or pneumothorax. Mild cardiomegaly, accentuated by technique. Negative aortic and hilar contours.  IMPRESSION: Stable low volume chest.   Electronically Signed   By: Monte Fantasia M.D.   On: 02/14/2015 15:32     EKG Interpretation   Date/Time:  Monday February 14 2015 14:41:27 EDT Ventricular Rate:  53 PR Interval:    QRS Duration: 103 QT Interval:  446 QTC Calculation: 419 R Axis:   107 Text Interpretation:  Atrial fibrillation Right axis deviation Borderline  low voltage, extremity leads No significant change since last tracing  Confirmed by Christy Gentles  MD, DONALD ([id number]) on  02/14/2015 2:46:27 PM      MDM   Final diagnoses:  None   Pt presenting with increased confusion per son who states this happens when she gets a UTI. She has chronic UTI with last treatment with doxy. VSS. No focal neurological deficits on exam. Pt oriented to person and place. ekg without acute changes and negative troponin. Negative CXR. labwork reassuring and mild elevation in lactic acid. CT, UA pending, duplex for left lower extremity pain and mild if any swelling. Culture ordered. Plan: if UA positive for infection treat with IV rocephin and keflex and close outpatient follow up. Otherwise if patient confusion worsens plan for neurology evaluation and possible admission.   Pt signed out to [employer] PA-C at shift change.  Discussed return precautions with patient and son. Discussed all results and patient and son verbalizes understanding and agrees with plan.  This is a shared patient. This patient was discussed with the physician, Dr. Christy Gentles who saw and evaluated the patient and agrees with the plan.       Al Corpus, PA-C 02/14/15 [place], MD 02/16/15 [phone]

## 2015-02-14 NOTE — ED Provider Notes (Signed)
Discussed case with Al Corpus, PA-C. Transfer of care from PA-C at change in shift.   [name] is a 74 y/o F with has medical history of arthritis, atrial fibrillation, CVA, seizures presenting to the ED with altered mental status-last seen normal yesterday. Patient is company by son. Patient has history of altered mental status usually due to UTI. Patient recently finished doxycycline approximately a couple of days ago secondary to UTI. As per son, patient does not appear to be at baseline.  Plan: UA, reassess. Highly anticipate admission.  This provider reassess the patient. Alert. Oriented 3. Heart rate and rhythm normal. Lungs good auscultation to upper lower lobes bilaterally. Equal grip strength. Negative facial droop. Negative slurred speech. Strength intact to upper and lower extremities bilaterally with resistance applied. Patient follows commands. Patient continues to talk about daughter and her daughter works at [employer] - son at bedside stated that patient does not have a daughter. Son reported that mother has been hostile and aggressive. Reported the patient is gone worse, stated that the symptoms started yesterday.  Results for orders placed or performed during the hospital encounter of 02/14/15  CBC with Differential  Result Value Ref Range   WBC 5.6 4.0 - 10.5 K/uL   RBC 4.60 3.87 - 5.11 MIL/uL   Hemoglobin 14.3 12.0 - 15.0 g/dL   HCT 43.0 36.0 - 46.0 %   MCV 93.5 78.0 - 100.0 fL   MCH 31.1 26.0 - 34.0 pg   MCHC 33.3 30.0 - 36.0 g/dL   RDW 12.0 11.5 - 15.5 %   Platelets 181 150 - 400 K/uL   Neutrophils Relative % 59 43 - 77 %   Neutro Abs 3.3 1.7 - 7.7 K/uL   Lymphocytes Relative 26 12 - 46 %   Lymphs Abs 1.5 0.7 - 4.0 K/uL   Monocytes Relative 12 3 - 12 %   Monocytes Absolute 0.7 0.1 - 1.0 K/uL   Eosinophils Relative 2 0 - 5 %   Eosinophils Absolute 0.1 0.0 - 0.7 K/uL   Basophils Relative 1 0 - 1 %   Basophils Absolute 0.0 0.0 - 0.1 K/uL  Comprehensive metabolic  panel  Result Value Ref Range   Sodium 137 135 - 145 mmol/L   Potassium 4.5 3.5 - 5.1 mmol/L   Chloride 106 96 - 112 mmol/L   CO2 24 19 - 32 mmol/L   Glucose, Bld 98 70 - 99 mg/dL   BUN 16 6 - 23 mg/dL   Creatinine, Ser 0.84 0.50 - 1.10 mg/dL   Calcium 9.2 8.4 - 10.5 mg/dL   Total Protein 7.2 6.0 - 8.3 g/dL   Albumin 3.3 (L) 3.5 - 5.2 g/dL   AST 31 0 - 37 U/L   ALT 24 0 - 35 U/L   Alkaline Phosphatase 87 39 - 117 U/L   Total Bilirubin 1.1 0.3 - 1.2 mg/dL   GFR calc non Af Amer 66 (L) >90 mL/min   GFR calc Af Amer 77 (L) >90 mL/min   Anion gap 7 5 - 15  I-Stat CG4 Lactic Acid, ED  Result Value Ref Range   Lactic Acid, Venous 2.38 (HH) 0.5 - 2.0 mmol/L   Comment NOTIFIED PHYSICIAN   I-Stat Troponin, ED (not at [facility])  Result Value Ref Range   Troponin i, poc 0.02 0.00 - 0.08 ng/mL   Comment 3           CBC negative elevated leukocytosis. Hemoglobin 14.3, hematocrit 43.0. CMP unremarkable. Lactic  acid elevated mildly at 2.38. Urinalysis negative-negative findings in hemoglobin, nitrites, leukocytes-negative findings of infection. Chest x-ray stable low volume chest. CT head without contrast noted acute intracranial pathology noted.   6:32 PM This provider spoke with Dr. Ronnie Derby, [facility]. Discussed case, labs, imaging, ED course in great detail. As per physician, recommended neurology be consulted secondary to patient having history of seizures currently on Keppra as well as history of atrial fibrillation on Pradaxa. Patient to be admitted to hospital on Telemetry floor.   7:14 PM This provider spoke with Dr. Aram Beecham, Neurologist. Discussed case, labs, imaging, ED course in great detail. Physician to assess patient. Patient to be followed by neurology.  CT head without contrast unremarkable. Negative findings of UTI or pyelonephritis. Negative findings of infection at this time. Patient to be admitted to the hospital regarding mental status, not at baseline. Patient to be  admitted for further work-up. Patient and family agreed to plan of admission. Patient stable for transfer.  Jamse Mead, PA-C 02/14/15 [place], MD 02/17/15 [phone]

## 2015-02-14 NOTE — ED Notes (Signed)
Critical I stat results given to Dr Christy Gentles

## 2015-02-15 DIAGNOSIS — F0390 Unspecified dementia without behavioral disturbance: Secondary | ICD-10-CM

## 2015-02-15 LAB — BASIC METABOLIC PANEL
ANION GAP: 6 (ref 5–15)
BUN: 11 mg/dL (ref 6–23)
CO2: 27 mmol/L (ref 19–32)
Calcium: 8.7 mg/dL (ref 8.4–10.5)
Chloride: 105 mmol/L (ref 96–112)
Creatinine, Ser: 0.81 mg/dL (ref 0.50–1.10)
GFR calc Af Amer: 80 mL/min — ABNORMAL LOW (ref >90)
GFR calc non Af Amer: 69 mL/min — ABNORMAL LOW (ref >90)
Glucose, Bld: 99 mg/dL (ref 70–99)
Potassium: 4.2 mmol/L (ref 3.5–5.1)
SODIUM: 138 mmol/L (ref 135–145)

## 2015-02-15 LAB — CBC
HCT: 39.9 % (ref 36.0–46.0)
Hemoglobin: 12.9 g/dL (ref 12.0–15.0)
MCH: 30.1 pg (ref 26.0–34.0)
MCHC: 32.3 g/dL (ref 30.0–36.0)
MCV: 93 fL (ref 78.0–100.0)
PLATELETS: 177 10*3/uL (ref 150–400)
RBC: 4.29 MIL/uL (ref 3.87–5.11)
RDW: 12 % (ref 11.5–15.5)
WBC: 5.1 10*3/uL (ref 4.0–10.5)

## 2015-02-15 LAB — URINE CULTURE

## 2015-02-15 LAB — TSH: TSH: 1.674 u[IU]/mL (ref 0.350–4.500)

## 2015-02-15 LAB — HIV ANTIBODY (ROUTINE TESTING W REFLEX): HIV Screen 4th Generation wRfx: NONREACTIVE

## 2015-02-15 LAB — RPR: RPR: NONREACTIVE

## 2015-02-15 LAB — LACTIC ACID, PLASMA: Lactic Acid, Venous: 3.1 mmol/L (ref 0.5–2.0)

## 2015-02-15 LAB — VITAMIN B12: VITAMIN B 12: 801 pg/mL (ref 211–911)

## 2015-02-15 NOTE — Progress Notes (Signed)
Admission paperwork complete with help of patient's son.

## 2015-02-15 NOTE — Progress Notes (Signed)
CRITICAL VALUE ALERT  Critical value received:  Elevated Lactic Acid (3.1)  Date of notification:  02/15/15  Time of notification:  1220  Critical value read back: YES  Nurse who received alert:  Sheppard Evens, RN  MD notified (1st page):  Fanny Bien.  Time of first page:  1220  MD notified (2nd page):  Time of second page:  Responding MD:  Broadus John, Mamie Nick.  Time MD responded:  1223

## 2015-02-15 NOTE — Progress Notes (Signed)
CARE MANAGEMENT NOTE 02/15/2015  Patient:  [name]   Account Number:  [id number]  Date Initiated:  02/15/2015  Documentation initiated by:  [name]  Subjective/Objective Assessment:   admitted with altered mental status  son is caregiver     Action/Plan:   plan home with HHPT and HHOT   Anticipated DC Date:  02/15/2015   Anticipated DC Plan:  [name]  CM consult      [facility] Choice  HOME HEALTH   Choice offered to / List presented to:  C-4 Adult Children        [name] arranged  [name]-2 PT  [name]-3 OT      [name]   Status of service:  Completed, signed off Medicare Important Message given?   (If response is "NO", the following Medicare IM given date fields will be blank) Date Medicare IM given:   Medicare IM given by:   Date Additional Medicare IM given:   Additional Medicare IM given by:    Discharge Disposition:  [name]  Per UR Regulation:  Reviewed for med. necessity/level of care/duration of stay  If discussed at [place] of Stay Meetings, dates discussed:    Comments:  02/15/15 Spoke with patient and patient's son regarding [name]. He selected [name]. Contacted [name] at [name] and set up [name].

## 2015-02-15 NOTE — Discharge Summary (Addendum)
Physician Discharge Summary  [name] RXV:[id number] DOB: [birth year] DOA: 02/14/2015  PCP: Janeece Agee, PA  Admit date: 02/14/2015 Discharge date: 02/15/2015  Time spent: 45 minutes  Recommendations for Outpatient Follow-up:  1. PCP in 1 week, needs Geri-Psychiatry evaluation 2. [facility] program enrollment 3. Home health Pt/OT  Discharge Diagnoses:  Principal Problem:   Dementia with delirium Active Problems:   Altered mental status   A-fib   CVA (cerebral infarction)   GERD (gastroesophageal reflux disease)   Seizure   Dementia   GERD without esophagitis   Discharge Condition: stable  Diet recommendation: heart healthy  Filed Weights   02/14/15 2006 02/15/15 0535  Weight: 174.4 kg (384 lb 7.7 oz) 174.4 kg (384 lb 7.7 oz)    History of present illness:  [name] is a 74 y.o. female, is triaged CVA causing mild left-sided hemiparesis and facial droop in the past, chronic atrial fibrillation on anticoagulation, GERD, seizures, possible early dementia who lives with her son for for the last 6 years has been brought in by the son for gradually progressive confusion, according to the son he noticed a distinct change in her mother since November about 4 months ago, she has been gradually getting little confused however over the past 2 weeks she has been more confused, at times aggressive.  Hospital Course:  1. Dementia with behavioural changes -she has had memory and functional decline for >64months -MRI/CT head unremarkable -CXR/UA normal, labs and vitals unremarkable -mentation back to baseline this morning, suspect intermittent sundowning, also worsened by Oxycodone which she takes very rarely and took 1pll of this yesterday which could have aggravated the problem. -Son is advised to stop narcotics since she takes it very rarely anyway and stop Benzos which she takes PRN since this can worsen delirium in elderly patients with dementia. - B12, TSh, RPR and HIV  are negative. -Son understands this and is comfortable caring for her and this time and will consider ALF down the road if needed -He has already called PACE program to get her enrolled in this. -she will continue low dose seroquel at bed time. -Recommended outpatient Geri-psych eval for further workup and treatment of dementia  2. Atrial fibrillation.CHADS2 Vasc score is 5 or above - continue beta blocker along with Pradaxa  3. GERD. Continue PPI.  4. 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 For now continue low-dose aspirin, statin along with anticoagulation as per home regimen for secondary prevention  5.H/O seizures - On Keppra  6. Elevated lactic acid:  -not clinically significant in the absence of S/S of sepsis, hypoperfusion or acute abdominal pathology      Discharge Exam: Filed Vitals:   02/15/15 1025  BP: 141/81  Pulse:   Temp: 98.6 F (37 C)  Resp: 18    General: AAO to self, place and partly to time Cardiovascular: S1S2/RRR Respiratory: CTAB  Discharge Instructions   Discharge Instructions    Diet - low sodium heart healthy    Complete by:  As directed      Increase activity slowly    Complete by:  As directed           Current Discharge Medication List    CONTINUE these medications which have NOT CHANGED   Details  acetaminophen (TYLENOL) 325 MG tablet Take 325 mg by mouth every 6 (six) hours as needed for moderate pain.    aspirin EC 81 MG tablet Take 81 mg by mouth daily.  calcium elemental as carbonate (BARIATRIC TUMS ULTRA) 400 MG tablet Chew 1,000 mg by mouth 2 (two) times daily.      Cholecalciferol 1000 UNITS capsule Take 1,000 Units by mouth daily.    dabigatran (PRADAXA) 150 MG CAPS capsule Take 150 mg by mouth 2 (two) times daily.    esomeprazole (NEXIUM) 40 MG capsule Take 40 mg by mouth daily before breakfast.      furosemide (LASIX) 20 MG tablet Take 20 mg by mouth daily.      ketotifen (ZADITOR)  0.025 % ophthalmic solution Place 1 drop into both eyes daily as needed (for dry eyes).    levETIRAcetam (KEPPRA) 500 MG tablet Take 500 mg by mouth 2 (two) times daily.      metoprolol (LOPRESSOR) 50 MG tablet Take 50 mg by mouth daily.      niacin 500 MG tablet Take 500 mg by mouth daily with breakfast.      potassium chloride (KLOR-CON) 10 MEQ CR tablet Take 10 mEq by mouth daily.      QUEtiapine (SEROQUEL) 25 MG tablet Take 50 mg by mouth at bedtime.     simvastatin (ZOCOR) 40 MG tablet Take 40 mg by mouth at bedtime.        STOP taking these medications     oxycodone (OXY-IR) 5 MG capsule      levofloxacin (LEVAQUIN) 500 MG tablet      LORazepam (ATIVAN) 1 MG tablet        No Known Allergies Follow-up Information    Follow up with [facility] P[facility] at [facility] soon if possible. Schedule an appointment as soon as possible for a visit in 1 week.       The results of significant diagnostics from this hospitalization (including imaging, microbiology, ancillary and laboratory) are listed below for reference.    Significant Diagnostic Studies: Dg Chest 2 View  02/14/2015   CLINICAL DATA:  Altered mental status and urinary tract infection  EXAM: CHEST  2 VIEW  COMPARISON:  11/06/2014  FINDINGS: Low lung volumes without visible pneumonia or edema. Mild scar over the left diaphragm is again noted. No effusion or pneumothorax. Mild cardiomegaly, accentuated by technique. Negative aortic and hilar contours.  IMPRESSION: Stable low volume chest.   Electronically Signed   By: Monte Fantasia M.D.   On: 02/14/2015 15:32   Ct Head Wo Contrast  02/14/2015   CLINICAL DATA:  Altered mental status, UTI  EXAM: CT HEAD WITHOUT CONTRAST  TECHNIQUE: Contiguous axial images were obtained from the base of the skull through the vertex without intravenous contrast.  COMPARISON:  11/02/2014  FINDINGS: There is no evidence of mass effect, midline shift, or extra-axial fluid collections. There is no  evidence of a space-occupying lesion or intracranial hemorrhage. There is no evidence of a cortical-based area of acute infarction. There is an all right MCA territory infarct with encephalomalacia. There is generalized cerebral atrophy. There is periventricular white matter low attenuation likely secondary to microangiopathy.  The ventricles and sulci are appropriate for the patient's age. The basal cisterns are patent.  Visualized portions of the orbits are unremarkable. The visualized portions of the paranasal sinuses and mastoid air cells are unremarkable. Cerebrovascular atherosclerotic calcifications are noted.  The osseous structures are unremarkable.  IMPRESSION: No acute intracranial pathology.   Electronically Signed   By: Kathreen Devoid   On: 02/14/2015 16:02   Mr Brain Wo Contrast  02/14/2015   CLINICAL DATA:  Increasing confusion for 4 months, worsening  over last 2 weeks. History of LEFT stroke, chronic atrial fibrillation, seizures and possible early dementia.  EXAM: MRI HEAD WITHOUT CONTRAST  TECHNIQUE: Multiplanar, multiecho pulse sequences of the brain and surrounding structures were obtained without intravenous contrast.  COMPARISON:  CT of the head February 14, 2015 at 1542 hours  FINDINGS: Mild motion degraded examination.  Faint linear areas of reduced diffusion along the margin of RIGHT frontal encephalomalacia likely represents T2 shine through, or artifact from mineralization as evident on the gradient sequence. No susceptibility artifact to suggest hemorrhage. No midline shift or mass effect.  Ex vacuo dilatation RIGHT lateral ventricle, ventricles and sulci are otherwise normal for patient's age. Diminutive RIGHT cerebral peduncle consistent with wallerian degeneration. Minimal white matter changes suggest chronic small vessel ischemic disease, less than expected for age.  No abnormal extra-axial fluid collections. Normal major intracranial vascular flow voids seen at the skull base.  No  abnormal sellar expansion. No cerebellar tonsillar ectopia. Generalized bright T1 bone marrow signal most consistent with osteopenia. Mild LEFT maxillary sinus mucosal thickening with atresia consistent with chronic sinusitis. The mastoid air cells are well aerated. Ocular globes and orbital contents are nonsuspicious though not tailored for evaluation.  IMPRESSION: No acute intracranial process on this mildly motion degraded examination.  Remote RIGHT middle cerebral artery territory infarct.   Electronically Signed   By: Elon Alas   On: 02/14/2015 22:15    Microbiology: No results found for this or any previous visit (from the past 240 hour(s)).   Labs: Basic Metabolic Panel:  Recent Labs Lab 02/14/15 1432 02/15/15 0511  NA 137 138  K 4.5 4.2  CL 106 105  CO2 24 27  GLUCOSE 98 99  BUN 16 11  CREATININE 0.84 0.81  CALCIUM 9.2 8.7   Liver Function Tests:  Recent Labs Lab 02/14/15 1432  AST 31  ALT 24  ALKPHOS 87  BILITOT 1.1  PROT 7.2  ALBUMIN 3.3*   No results for input(s): LIPASE, AMYLASE in the last 168 hours. No results for input(s): AMMONIA in the last 168 hours. CBC:  Recent Labs Lab 02/14/15 1432 02/15/15 0511  WBC 5.6 5.1  NEUTROABS 3.3  --   HGB 14.3 12.9  HCT 43.0 39.9  MCV 93.5 93.0  PLT 181 177   Cardiac Enzymes: No results for input(s): CKTOTAL, CKMB, CKMBINDEX, TROPONINI in the last 168 hours. BNP: BNP (last 3 results) No results for input(s): BNP in the last 8760 hours.  ProBNP (last 3 results)  Recent Labs  11/06/14 0334  PROBNP 1923.0*    CBG: No results for input(s): GLUCAP in the last 168 hours.     SignedDomenic Polite  [facility] 02/15/2015, 11:12 AM
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
## 2015-02-15 NOTE — Progress Notes (Signed)
[name] to be D/C'd Home per MD order.  Discussed with the patient and all questions fully answered.  VSS. IV catheter discontinued intact. Site without signs and symptoms of complications. Dressing and pressure applied.  An After Visit Summary was printed and given to the patient. Patient received prescription.  D/c education completed with patient/family including follow up instructions, medication list, d/c activities limitations if indicated, with other d/c instructions as indicated by MD - patient able to verbalize understanding, all questions fully answered.   Patient instructed to return to ED, call 911, or call MD for any changes in condition.   Patient escorted via [place], and D/C home via private auto.  [name], Nicholus Chandran C 02/15/2015 3:56 PM

## 2015-02-16 LAB — FOLATE RBC
FOLATE, HEMOLYSATE: 506.8 ng/mL
Folate, RBC: 1131 ng/mL (ref >498)
Hematocrit: 44.8 % (ref 34.0–46.6)

## 2015-02-18 LAB — LEVETIRACETAM LEVEL: Levetiracetam Lvl: 8.4 ug/mL — ABNORMAL LOW (ref 10.0–40.0)

## 2015-03-18 ENCOUNTER — Emergency Department
Admit: 2015-03-18 | Disposition: A | Discharge status: Home / Self Care | Payer: Self-pay | Admitting: Emergency Medicine

## 2015-03-19 LAB — COMPREHENSIVE METABOLIC PANEL
ALK PHOS: 91 U/L
ANION GAP: 6 — AB (ref 7–16)
Albumin: 4 g/dL
BILIRUBIN TOTAL: 1.4 mg/dL — AB
BUN: 19 mg/dL
CHLORIDE: 108 mmol/L
CO2: 27 mmol/L
Calcium, Total: 9.1 mg/dL
Creatinine: 0.8 mg/dL
EGFR (African American): >60
EGFR (Non-African Amer.): >60
Glucose: 111 mg/dL — ABNORMAL HIGH
POTASSIUM: 4.1 mmol/L
SGOT(AST): 24 U/L
SGPT (ALT): 18 U/L
Sodium: 141 mmol/L
Total Protein: 8 g/dL

## 2015-03-19 LAB — URINALYSIS, COMPLETE
Bilirubin,UR: NEGATIVE
Glucose,UR: NEGATIVE mg/dL (ref 0–75)
Ketone: NEGATIVE
LEUKOCYTE ESTERASE: NEGATIVE
NITRITE: POSITIVE
PROTEIN: NEGATIVE
Ph: 5 (ref 4.5–8.0)
Specific Gravity: 1.024 (ref 1.003–1.030)

## 2015-03-19 LAB — CBC
HCT: 43 % (ref 35.0–47.0)
HGB: 14 g/dL (ref 12.0–16.0)
MCH: 30.7 pg (ref 26.0–34.0)
MCHC: 32.5 g/dL (ref 32.0–36.0)
MCV: 94 fL (ref 80–100)
Platelet: 192 10*3/uL (ref 150–440)
RBC: 4.55 10*6/uL (ref 3.80–5.20)
RDW: 12.5 % (ref 11.5–14.5)
WBC: 7.3 10*3/uL (ref 3.6–11.0)

## 2015-03-28 ENCOUNTER — Inpatient Hospital Stay (HOSPITAL_COMMUNITY)
Admission: EM | Admit: 2015-03-28 | Discharge: 2015-04-01 | DRG: 562 | Disposition: A | Discharge status: Skilled Nursing Facility | Payer: Medicare Other | Attending: Internal Medicine | Admitting: Internal Medicine

## 2015-03-28 ENCOUNTER — Encounter (HOSPITAL_COMMUNITY): Payer: Self-pay

## 2015-03-28 DIAGNOSIS — E785 Hyperlipidemia, unspecified: Secondary | ICD-10-CM | POA: Diagnosis present

## 2015-03-28 DIAGNOSIS — Z6841 Body Mass Index (BMI) 40.0 and over, adult: Secondary | ICD-10-CM

## 2015-03-28 DIAGNOSIS — Z7401 Bed confinement status: Secondary | ICD-10-CM

## 2015-03-28 DIAGNOSIS — Z7901 Long term (current) use of anticoagulants: Secondary | ICD-10-CM

## 2015-03-28 DIAGNOSIS — IMO0002 Reserved for concepts with insufficient information to code with codable children: Secondary | ICD-10-CM | POA: Diagnosis present

## 2015-03-28 DIAGNOSIS — K219 Gastro-esophageal reflux disease without esophagitis: Secondary | ICD-10-CM | POA: Diagnosis present

## 2015-03-28 DIAGNOSIS — S82142A Displaced bicondylar fracture of left tibia, initial encounter for closed fracture: Secondary | ICD-10-CM | POA: Diagnosis not present

## 2015-03-28 DIAGNOSIS — M1990 Unspecified osteoarthritis, unspecified site: Secondary | ICD-10-CM | POA: Diagnosis present

## 2015-03-28 DIAGNOSIS — I10 Essential (primary) hypertension: Secondary | ICD-10-CM | POA: Diagnosis present

## 2015-03-28 DIAGNOSIS — I639 Cerebral infarction, unspecified: Secondary | ICD-10-CM | POA: Diagnosis present

## 2015-03-28 DIAGNOSIS — F0390 Unspecified dementia without behavioral disturbance: Secondary | ICD-10-CM | POA: Diagnosis present

## 2015-03-28 DIAGNOSIS — R569 Unspecified convulsions: Secondary | ICD-10-CM | POA: Diagnosis present

## 2015-03-28 DIAGNOSIS — F0150 Vascular dementia without behavioral disturbance: Secondary | ICD-10-CM | POA: Diagnosis present

## 2015-03-28 DIAGNOSIS — R4182 Altered mental status, unspecified: Secondary | ICD-10-CM | POA: Diagnosis present

## 2015-03-28 DIAGNOSIS — I4891 Unspecified atrial fibrillation: Secondary | ICD-10-CM | POA: Diagnosis present

## 2015-03-28 DIAGNOSIS — Z8673 Personal history of transient ischemic attack (TIA), and cerebral infarction without residual deficits: Secondary | ICD-10-CM

## 2015-03-28 DIAGNOSIS — N390 Urinary tract infection, site not specified: Secondary | ICD-10-CM | POA: Diagnosis present

## 2015-03-28 DIAGNOSIS — M79605 Pain in left leg: Secondary | ICD-10-CM | POA: Diagnosis not present

## 2015-03-28 DIAGNOSIS — G9340 Encephalopathy, unspecified: Secondary | ICD-10-CM | POA: Diagnosis present

## 2015-03-28 DIAGNOSIS — Z9181 History of falling: Secondary | ICD-10-CM

## 2015-03-28 DIAGNOSIS — Z7982 Long term (current) use of aspirin: Secondary | ICD-10-CM

## 2015-03-28 DIAGNOSIS — W19XXXA Unspecified fall, initial encounter: Secondary | ICD-10-CM | POA: Insufficient documentation

## 2015-03-28 DIAGNOSIS — G4700 Insomnia, unspecified: Secondary | ICD-10-CM | POA: Diagnosis present

## 2015-03-28 DIAGNOSIS — F22 Delusional disorders: Secondary | ICD-10-CM | POA: Diagnosis present

## 2015-03-28 DIAGNOSIS — Z96649 Presence of unspecified artificial hip joint: Secondary | ICD-10-CM | POA: Diagnosis present

## 2015-03-28 DIAGNOSIS — E669 Obesity, unspecified: Secondary | ICD-10-CM | POA: Diagnosis present

## 2015-03-28 DIAGNOSIS — R109 Unspecified abdominal pain: Secondary | ICD-10-CM

## 2015-03-28 HISTORY — DX: Other complications of anesthesia, initial encounter: T88.59XA

## 2015-03-28 HISTORY — DX: Adverse effect of unspecified anesthetic, initial encounter: T41.45XA

## 2015-03-28 NOTE — ED Provider Notes (Signed)
CSN: [id number]     Arrival date & time 03/28/15  2208 History   First MD Initiated Contact with Patient 03/28/15 2237     Chief Complaint  Patient presents with  . Leg Swelling     The history is provided by a relative and the EMS personnel. No language interpreter was used.   [name] presents for evaluation of left leg swelling.  She had a fall 10 days ago in her gravel driveway and was evaluated at [facility].  She had a CT scan and xray performed at that time.  She had no head injury or loss of consciousness.  Since the fall she has had progressive pain in her LLE with associated swelling and bruising.  She reports pain from her knee to her toes and pain with ambulation.  She has a hx/o dementia and CVA and takes pradaxa for afib.  Her son noticed that her urine started to smell bad two days ago and he started her on doxycycline.  No reports of fever, chest pain, SOB, N/V/D.  Hx is limited due to patient's dementia and hx is provided by the son.  The patient lives with her son.  Sxs are moderate, constant, worsening.    Past Medical History  Diagnosis Date  . Arthritis   . Atrial fibrillation   . CVA (cerebral infarction)   . Seizures   . A-fib 02/14/2015  . CVA (cerebral infarction) 02/14/2015  . GERD (gastroesophageal reflux disease) 02/14/2015  . GERD without esophagitis 02/14/2015  . Seizure 02/14/2015   Past Surgical History  Procedure Laterality Date  . Total hip arthroplasty    . Joint replacement    . Fracture surgery     No family history on file. History  Substance Use Topics  . Smoking status: Never Smoker   . Smokeless tobacco: Not on file  . Alcohol Use: No   OB History    No data available     Review of Systems  All other systems reviewed and are negative.     Allergies  Review of patient's allergies indicates no known allergies.  Home Medications   Prior to Admission medications   Medication Sig Start Date End Date Taking? Authorizing  Provider  acetaminophen (TYLENOL) 325 MG tablet Take 325 mg by mouth every 6 (six) hours as needed for moderate pain.    Historical Provider, MD  aspirin EC 81 MG tablet Take 81 mg by mouth daily.      Historical Provider, MD  calcium elemental as carbonate (BARIATRIC TUMS ULTRA) 400 MG tablet Chew 1,000 mg by mouth 2 (two) times daily.      Historical Provider, MD  Cholecalciferol 1000 UNITS capsule Take 1,000 Units by mouth daily.    Historical Provider, MD  dabigatran (PRADAXA) 150 MG CAPS capsule Take 150 mg by mouth 2 (two) times daily.    Historical Provider, MD  esomeprazole (NEXIUM) 40 MG capsule Take 40 mg by mouth daily before breakfast.      Historical Provider, MD  furosemide (LASIX) 20 MG tablet Take 20 mg by mouth daily.      Historical Provider, MD  ketotifen (ZADITOR) 0.025 % ophthalmic solution Place 1 drop into both eyes daily as needed (for dry eyes).    Historical Provider, MD  levETIRAcetam (KEPPRA) 500 MG tablet Take 500 mg by mouth 2 (two) times daily.      Historical Provider, MD  metoprolol (LOPRESSOR) 50 MG tablet Take 50 mg by mouth daily.  Historical Provider, MD  niacin 500 MG tablet Take 500 mg by mouth daily with breakfast.      Historical Provider, MD  potassium chloride (KLOR-CON) 10 MEQ CR tablet Take 10 mEq by mouth daily.      Historical Provider, MD  QUEtiapine (SEROQUEL) 25 MG tablet Take 50 mg by mouth at bedtime.  10/14/14 02/14/15  Historical Provider, MD  simvastatin (ZOCOR) 40 MG tablet Take 40 mg by mouth at bedtime.      Historical Provider, MD   BP 112/55 mmHg  Pulse 67  Temp(Src) 98.7 F (37.1 C) (Oral)  Resp 15  SpO2 99% Physical Exam  Constitutional: She appears well-developed and well-nourished.  obese  HENT:  Head: Normocephalic and atraumatic.  Cardiovascular: Normal rate and regular rhythm.   No murmur heard. Pulmonary/Chest: Effort normal and breath sounds normal. No respiratory distress.  Abdominal: Soft. There is no tenderness.  There is no rebound and no guarding.  Musculoskeletal:  1-2+ pitting edema of LLE from knee to foot.  Ecchymosis from knee to foot.  2+ DP pulses in BLE.  No appreciable tenderness on examination of LLE but pt does appear uncomfortable with flexion/extension of knee and ankle.  Neurological: She is alert.  Confused, disoriented to place and time  Skin: Skin is warm and dry.  Psychiatric: She has a normal mood and affect. Her behavior is normal.  Nursing note and vitals reviewed.   ED Course  Procedures (including critical care time) Labs Review Labs Reviewed  COMPREHENSIVE METABOLIC PANEL - Abnormal; Notable for the following:    Albumin 3.4 (*)    All other components within normal limits  CBC WITH DIFFERENTIAL/PLATELET  URINALYSIS, ROUTINE W REFLEX MICROSCOPIC    Imaging Review No results found.   EKG Interpretation None      MDM   Final diagnoses:  Left leg pain    Pt with hx/o dementia here with increased pain and swelling in left leg, had a fall 10 days ago.  Hx and exam is limited due to dementia.  On attempting to stand/ambulate patient she will not put weight on her left leg.  Plan to check CT knee/ankle to further evaluate.  Pt care transferred pending CT.    Please contact son, [name] [phone]  Quintella Reichert, MD 03/29/15 [phone]

## 2015-03-28 NOTE — ED Notes (Signed)
Spoke with family about plan of care. Waiting to see MD.

## 2015-03-28 NOTE — ED Notes (Signed)
MD Rees at the bedside  

## 2015-03-28 NOTE — ED Notes (Signed)
Per EMS, Patient fell about ten days ago. Patient was seen at ED. Patient was worked up with no abnormal results. Patient is living with her son. Son reports patient screaming in pain during the last week. During ambulation, Patient favors the left leg. EMS reports patient ambulating to the stretcher. Patient has increased swelling to the left leg. History of Dementia, Depression, Anxiety, Schizoaffective disorder, CVA, Afib. Patient is being treated for a UTI with Doxycycline. Vitals per EMS: 107/58, 107 HR, 16 RR, 97%.

## 2015-03-29 ENCOUNTER — Observation Stay (HOSPITAL_COMMUNITY): Payer: Medicare Other

## 2015-03-29 ENCOUNTER — Emergency Department (HOSPITAL_COMMUNITY): Payer: Medicare Other

## 2015-03-29 ENCOUNTER — Encounter (HOSPITAL_COMMUNITY): Payer: Self-pay | Admitting: General Practice

## 2015-03-29 DIAGNOSIS — S82892A Other fracture of left lower leg, initial encounter for closed fracture: Secondary | ICD-10-CM | POA: Diagnosis not present

## 2015-03-29 DIAGNOSIS — I482 Chronic atrial fibrillation: Secondary | ICD-10-CM

## 2015-03-29 DIAGNOSIS — F0390 Unspecified dementia without behavioral disturbance: Secondary | ICD-10-CM

## 2015-03-29 DIAGNOSIS — N390 Urinary tract infection, site not specified: Secondary | ICD-10-CM

## 2015-03-29 DIAGNOSIS — I6310 Cerebral infarction due to embolism of unspecified precerebral artery: Secondary | ICD-10-CM

## 2015-03-29 DIAGNOSIS — I481 Persistent atrial fibrillation: Secondary | ICD-10-CM | POA: Diagnosis not present

## 2015-03-29 DIAGNOSIS — IMO0002 Reserved for concepts with insufficient information to code with codable children: Secondary | ICD-10-CM | POA: Diagnosis present

## 2015-03-29 DIAGNOSIS — K219 Gastro-esophageal reflux disease without esophagitis: Secondary | ICD-10-CM

## 2015-03-29 DIAGNOSIS — R4182 Altered mental status, unspecified: Secondary | ICD-10-CM

## 2015-03-29 LAB — CBC
HCT: 39.5 % (ref 36.0–46.0)
Hemoglobin: 12.7 g/dL (ref 12.0–15.0)
MCH: 29.9 pg (ref 26.0–34.0)
MCHC: 32.2 g/dL (ref 30.0–36.0)
MCV: 92.9 fL (ref 78.0–100.0)
PLATELETS: 177 10*3/uL (ref 150–400)
RBC: 4.25 MIL/uL (ref 3.87–5.11)
RDW: 12.3 % (ref 11.5–15.5)
WBC: 5.1 10*3/uL (ref 4.0–10.5)

## 2015-03-29 LAB — COMPREHENSIVE METABOLIC PANEL
ALK PHOS: 97 U/L (ref 38–126)
ALT: 17 U/L (ref 14–54)
ALT: 18 U/L (ref 14–54)
ANION GAP: 10 (ref 5–15)
ANION GAP: 12 (ref 5–15)
AST: 18 U/L (ref 15–41)
AST: 19 U/L (ref 15–41)
Albumin: 3.4 g/dL — ABNORMAL LOW (ref 3.5–5.0)
Albumin: 3 g/dL — ABNORMAL LOW (ref 3.5–5.0)
Alkaline Phosphatase: 86 U/L (ref 38–126)
BUN: 16 mg/dL (ref 6–20)
BUN: 15 mg/dL (ref 6–20)
CO2: 22 mmol/L (ref 22–32)
CO2: 23 mmol/L (ref 22–32)
Calcium: 9.2 mg/dL (ref 8.9–10.3)
Calcium: 8.9 mg/dL (ref 8.9–10.3)
Chloride: 107 mmol/L (ref 101–111)
Chloride: 106 mmol/L (ref 101–111)
Creatinine, Ser: 0.79 mg/dL (ref 0.44–1.00)
Creatinine, Ser: 0.75 mg/dL (ref 0.44–1.00)
GFR calc Af Amer: >60 mL/min (ref >60)
GFR calc non Af Amer: >60 mL/min (ref >60)
GLUCOSE: 99 mg/dL (ref 70–99)
Glucose, Bld: 97 mg/dL (ref 70–99)
POTASSIUM: 4 mmol/L (ref 3.5–5.1)
Potassium: 3.9 mmol/L (ref 3.5–5.1)
SODIUM: 139 mmol/L (ref 135–145)
SODIUM: 141 mmol/L (ref 135–145)
TOTAL PROTEIN: 6.8 g/dL (ref 6.5–8.1)
TOTAL PROTEIN: 7.4 g/dL (ref 6.5–8.1)
Total Bilirubin: 1.2 mg/dL (ref 0.3–1.2)
Total Bilirubin: 1.3 mg/dL — ABNORMAL HIGH (ref 0.3–1.2)

## 2015-03-29 LAB — CBC WITH DIFFERENTIAL/PLATELET
BASOS ABS: 0 10*3/uL (ref 0.0–0.1)
BASOS PCT: 0 % (ref 0–1)
EOS PCT: 3 % (ref 0–5)
Eosinophils Absolute: 0.1 10*3/uL (ref 0.0–0.7)
HEMATOCRIT: 41.4 % (ref 36.0–46.0)
Hemoglobin: 13.3 g/dL (ref 12.0–15.0)
LYMPHS PCT: 31 % (ref 12–46)
Lymphs Abs: 1.6 10*3/uL (ref 0.7–4.0)
MCH: 30 pg (ref 26.0–34.0)
MCHC: 32.1 g/dL (ref 30.0–36.0)
MCV: 93.2 fL (ref 78.0–100.0)
Monocytes Absolute: 0.6 10*3/uL (ref 0.1–1.0)
Monocytes Relative: 11 % (ref 3–12)
NEUTROS ABS: 2.9 10*3/uL (ref 1.7–7.7)
NEUTROS PCT: 56 % (ref 43–77)
PLATELETS: 171 10*3/uL (ref 150–400)
RBC: 4.44 MIL/uL (ref 3.87–5.11)
RDW: 12.2 % (ref 11.5–15.5)
WBC: 5.2 10*3/uL (ref 4.0–10.5)

## 2015-03-29 LAB — URINALYSIS, ROUTINE W REFLEX MICROSCOPIC
Bilirubin Urine: NEGATIVE
Glucose, UA: NEGATIVE mg/dL
HGB URINE DIPSTICK: NEGATIVE
Ketones, ur: NEGATIVE mg/dL
Nitrite: POSITIVE — AB
PROTEIN: NEGATIVE mg/dL
Specific Gravity, Urine: 1.022 (ref 1.005–1.030)
Urobilinogen, UA: 2 mg/dL — ABNORMAL HIGH (ref 0.0–1.0)
pH: 5.5 (ref 5.0–8.0)

## 2015-03-29 LAB — URINE MICROSCOPIC-ADD ON

## 2015-03-29 LAB — HEPARIN LEVEL (UNFRACTIONATED): Heparin Unfractionated: 0.23 IU/mL — ABNORMAL LOW (ref 0.30–0.70)

## 2015-03-29 LAB — PROTIME-INR
INR: 1.65 — AB (ref 0.00–1.49)
Prothrombin Time: 19.7 seconds — ABNORMAL HIGH (ref 11.6–15.2)

## 2015-03-29 LAB — APTT: aPTT: 49 seconds — ABNORMAL HIGH (ref 24–37)

## 2015-03-29 MED ORDER — DEXTROSE 5 % IV SOLN
2.0000 g | Freq: Once | INTRAVENOUS | Status: AC
  Administered 2015-03-29: 2 g via INTRAVENOUS
  Filled 2015-03-29: qty 2

## 2015-03-29 MED ORDER — LEVETIRACETAM 500 MG PO TABS
500.0000 mg | ORAL_TABLET | Freq: Two times a day (BID) | ORAL | Status: DC
  Administered 2015-03-29 – 2015-04-01 (×7): 500 mg via ORAL
  Filled 2015-03-29 (×9): qty 1

## 2015-03-29 MED ORDER — METHOCARBAMOL 500 MG PO TABS
500.0000 mg | ORAL_TABLET | Freq: Four times a day (QID) | ORAL | Status: DC | PRN
  Administered 2015-03-29 – 2015-03-31 (×3): 500 mg via ORAL
  Filled 2015-03-29 (×4): qty 1

## 2015-03-29 MED ORDER — HYDROCODONE-ACETAMINOPHEN 5-325 MG PO TABS
1.0000 | ORAL_TABLET | Freq: Four times a day (QID) | ORAL | Status: DC | PRN
  Administered 2015-03-29: 1 via ORAL
  Administered 2015-03-29 – 2015-03-30 (×2): 2 via ORAL
  Filled 2015-03-29: qty 1
  Filled 2015-03-29 (×2): qty 2

## 2015-03-29 MED ORDER — PANTOPRAZOLE SODIUM 40 MG PO TBEC
40.0000 mg | DELAYED_RELEASE_TABLET | Freq: Every day | ORAL | Status: DC
  Administered 2015-03-29 – 2015-04-01 (×4): 40 mg via ORAL
  Filled 2015-03-29 (×5): qty 1

## 2015-03-29 MED ORDER — FUROSEMIDE 20 MG PO TABS
20.0000 mg | ORAL_TABLET | Freq: Every day | ORAL | Status: DC
  Administered 2015-03-29 – 2015-04-01 (×4): 20 mg via ORAL
  Filled 2015-03-29 (×4): qty 1

## 2015-03-29 MED ORDER — METOPROLOL TARTRATE 50 MG PO TABS
50.0000 mg | ORAL_TABLET | Freq: Every day | ORAL | Status: DC
  Administered 2015-03-29 – 2015-04-01 (×4): 50 mg via ORAL
  Filled 2015-03-29 (×5): qty 1

## 2015-03-29 MED ORDER — SIMVASTATIN 40 MG PO TABS
40.0000 mg | ORAL_TABLET | Freq: Every day | ORAL | Status: DC
  Administered 2015-03-29 – 2015-03-31 (×3): 40 mg via ORAL
  Filled 2015-03-29 (×4): qty 1

## 2015-03-29 MED ORDER — DEXTROSE 5 % IV SOLN
2.0000 g | INTRAVENOUS | Status: DC
  Administered 2015-03-30 – 2015-03-31 (×2): 2 g via INTRAVENOUS
  Filled 2015-03-29 (×2): qty 2

## 2015-03-29 MED ORDER — ASPIRIN EC 81 MG PO TBEC
81.0000 mg | DELAYED_RELEASE_TABLET | Freq: Every day | ORAL | Status: DC
  Administered 2015-03-29 – 2015-03-31 (×3): 81 mg via ORAL
  Filled 2015-03-29 (×3): qty 1

## 2015-03-29 MED ORDER — METHOCARBAMOL 1000 MG/10ML IJ SOLN
500.0000 mg | Freq: Four times a day (QID) | INTRAVENOUS | Status: DC | PRN
  Filled 2015-03-29: qty 5

## 2015-03-29 MED ORDER — HEPARIN (PORCINE) IN NACL 100-0.45 UNIT/ML-% IJ SOLN
1650.0000 [IU]/h | INTRAMUSCULAR | Status: AC
  Administered 2015-03-29: 1500 [IU]/h via INTRAVENOUS
  Filled 2015-03-29 (×4): qty 250

## 2015-03-29 NOTE — ED Notes (Signed)
Patient transported to X-ray 

## 2015-03-29 NOTE — Progress Notes (Signed)
ANTICOAGULATION CONSULT NOTE - Initial Consult  Pharmacy Consult for heparin Indication: atrial fibrillation  No Known Allergies  Patient Measurements: Height: 5\' 4"  (162.6 cm) Weight: (!) 384 lb 7.7 oz (174.4 kg) IBW/kg (Calculated) : 54.7 Heparin Dosing Weight: 110kg  Vital Signs: Temp: 98.7 F (37.1 C) (05/09 2212) Temp Source: Oral (05/09 2212) BP: 98/57 mmHg (05/10 0500) Pulse Rate: 66 (05/10 0245)  Labs:  Recent Labs  03/29/15 0032  HGB 13.3  HCT 41.4  PLT 171  CREATININE 0.79    Estimated Creatinine Clearance: 98.4 mL/min (by C-G formula based on Cr of 0.79).   Medical History: Past Medical History  Diagnosis Date  . Arthritis   . Atrial fibrillation   . CVA (cerebral infarction)   . Seizures   . A-fib 02/14/2015  . CVA (cerebral infarction) 02/14/2015  . GERD (gastroesophageal reflux disease) 02/14/2015  . GERD without esophagitis 02/14/2015  . Seizure 02/14/2015    Assessment: 74yo female fell ~10d ago, reports progressive pain in LLE associated w/ swelling and bruising, on Pradaxa PTA for Afib, to hold Pradaxa during w/u of hematoma with plan to resume after stabilization, to begin heparin bridge; last dose of Pradaxa unclear but arrived in ED ~2200 so has not had any in at least 10hr.  Goal of Therapy:  Heparin level 0.3-0.7 units/ml Monitor platelets by anticoagulation protocol: Yes   Plan:  Will begin heparin gtt at 1500 units/hr and monitor heparin levels and CBC.  Wynona Neat, PharmD, BCPS  03/29/2015,7:04 AM

## 2015-03-29 NOTE — Progress Notes (Signed)
Patient admitted early this morning by Dr. Berle Mull.  Please see H&P for full details. Agree with current assessment and plan.   74 year old female with history of atrial fibrillation on Pradaxa, CVA, seizures, dementia presented after a fall. Patient lives with her son at home. She did fall approximately 2 times the last 10 days.  Blood pressure 128/70, pulse 66, temperature 98 F (36.7 C), temperature source Oral, resp. rate 18, height 5\' 4"  (1.626 m), weight 174.4 kg (384 lb 7.7 oz), SpO2 91 %.  Left knee fracture/ Left lower ext edema -CT of the left knee shows possible nondisplaced lateral tibial plateau fracture. -Orthopedics, Dr. Edmonia Lynch consulted and appreciated -Likely nonoperative, treat conservatively with no weight baring -PT consulted to be consulted once seen by ortho -lower ext doppler pending  Atrial fibrillation/pause -Cardiology consultation appreciated -Pradaxa currently held and patient placed on heparin GTT -Question using anticoagulation given patient's age, fall risk and dementia  Acute encephalopathy with UTI -Patient does have underlying dementia -Continue ceftriaxone  Time spent: 25 minutes  Montanna Mcbain D.O. [facility] Pager [phone]  If 7PM-7AM, please contact night-coverage [email or web address] Password [email or web address] 03/29/2015, 1:43 PM

## 2015-03-29 NOTE — Consult Note (Signed)
ORTHOPAEDIC CONSULTATION  REQUESTING PHYSICIAN: No att. providers found  Chief Complaint: Left knee pain  HPI: [name] is a 74 y.o. female who has a h/o dementia. She suffered a fall 10 days ago. CT demonstrates non-displaced Lat plateau fx.   Past Medical History  Diagnosis Date  . Arthritis   . Atrial fibrillation   . CVA (cerebral infarction)   . Seizures   . A-fib 02/14/2015  . CVA (cerebral infarction) 02/14/2015  . GERD (gastroesophageal reflux disease) 02/14/2015  . GERD without esophagitis 02/14/2015  . Seizure 02/14/2015   Past Surgical History  Procedure Laterality Date  . Total hip arthroplasty    . Joint replacement    . Fracture surgery     History   Social History  . Marital Status: Widowed    Spouse Name: N/A  . Number of Children: N/A  . Years of Education: N/A   Social History Main Topics  . Smoking status: Never Smoker   . Smokeless tobacco: Not on file  . Alcohol Use: No  . Drug Use: No  . Sexual Activity: No   Other Topics Concern  . None   Social History Narrative   No family history on file. No Known Allergies Prior to Admission medications   Medication Sig Start Date End Date Taking? Authorizing Provider  acetaminophen (TYLENOL) 325 MG tablet Take 325 mg by mouth every 6 (six) hours as needed for moderate pain.    Historical Provider, MD  aspirin EC 81 MG tablet Take 81 mg by mouth daily.      Historical Provider, MD  calcium elemental as carbonate (BARIATRIC TUMS ULTRA) 400 MG tablet Chew 1,000 mg by mouth 2 (two) times daily.      Historical Provider, MD  Cholecalciferol 1000 UNITS capsule Take 1,000 Units by mouth daily.    Historical Provider, MD  dabigatran (PRADAXA) 150 MG CAPS capsule Take 150 mg by mouth 2 (two) times daily.    Historical Provider, MD  esomeprazole (NEXIUM) 40 MG capsule Take 40 mg by mouth daily before breakfast.      Historical Provider, MD  furosemide (LASIX) 20 MG tablet Take 20 mg by mouth daily.       Historical Provider, MD  ketotifen (ZADITOR) 0.025 % ophthalmic solution Place 1 drop into both eyes daily as needed (for dry eyes).    Historical Provider, MD  levETIRAcetam (KEPPRA) 500 MG tablet Take 500 mg by mouth 2 (two) times daily.      Historical Provider, MD  metoprolol (LOPRESSOR) 50 MG tablet Take 50 mg by mouth daily.      Historical Provider, MD  niacin 500 MG tablet Take 500 mg by mouth daily with breakfast.      Historical Provider, MD  potassium chloride (KLOR-CON) 10 MEQ CR tablet Take 10 mEq by mouth daily.      Historical Provider, MD  QUEtiapine (SEROQUEL) 25 MG tablet Take 50 mg by mouth at bedtime.  10/14/14 02/14/15  Historical Provider, MD  simvastatin (ZOCOR) 40 MG tablet Take 40 mg by mouth at bedtime.      Historical Provider, MD   Dg Ankle Complete Left  03/29/2015   CLINICAL DATA:  [place] 3 weeks ago and fell repeatedly this week.  EXAM: LEFT ANKLE COMPLETE - 3+ VIEW  COMPARISON:  None.  FINDINGS: There is a small calcification at the medial aspect of the talus which more likely is not an acute fracture. It appears to be corticated.  There is no acute fracture. The mortise is symmetric. Moderate degenerative changes are present at the talonavicular articulation.  IMPRESSION: Negative for acute fracture   Electronically Signed   By: Andreas Newport M.D.   On: 03/29/2015 01:07   Ct Knee Left Wo Contrast  03/29/2015   CLINICAL DATA:  Fall 10 days ago in was seen in ED. Continued pain and swelling since then.  EXAM: CT OF THE left KNEE WITHOUT CONTRAST  TECHNIQUE: Multidetector CT imaging of the left knee was performed according to the standard protocol. Multiplanar CT image reconstructions were also generated.  COMPARISON:  Left knee radiographs 03/29/2015 and 03/18/2015.  FINDINGS: Mild subcutaneous soft tissue edema about the left kidney. Minimal effusion. Vascular calcifications. Tricompartment degenerative changes in the left knee. Diffuse bone demineralization. Suggestion  of vague linear lucency with possible cortical step-off along the lateral tibial plateau. This could represent nondisplaced tibial plateau fracture. Suggest MRI for further evaluation. Distal femur and patella appear intact. No focal bone lesion or bone destruction identified.  IMPRESSION: Possible nondisplaced lateral tibial plateau fracture. Suggest MRI for further evaluation. Tricompartment degenerative changes. Mild edema. Minimal effusion.   Electronically Signed   By: Lucienne Capers M.D.   On: 03/29/2015 02:58   Ct Ankle Left Wo Contrast  03/29/2015   CLINICAL DATA:  Patient fell about 10 days ago and was seen in the ED. Continued pain and swelling.  EXAM: CT OF THE LEFT ANKLE WITHOUT CONTRAST  TECHNIQUE: Multidetector CT imaging of the left ankle was performed according to the standard protocol. Multiplanar CT image reconstructions were also generated.  COMPARISON:  Left ankle radiographs 03/29/2015  FINDINGS: Diffuse soft tissue edema throughout the subcutaneous fat of the left ankle, most prominent anteriorly and laterally. Vascular calcifications are present in the anterior and posterior tibial arteries.  Degenerative changes in the left ankle. Degenerative changes in the visualized intertarsal joints. No evidence of acute fracture or dislocation. Ankle mortise and talar dome appear intact. No focal bone lesion or bone destruction. Bone cortex and trabecular architecture appear intact. 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  IMPRESSION: Diffuse swelling about the left ankle. Degenerative changes. No acute fracture or dislocation.   Electronically Signed   By: Lucienne Capers M.D.   On: 03/29/2015 02:53   Dg Knee Complete 4 Views Left  03/29/2015   CLINICAL DATA:  Left ankle and knee pain since a fall 3 weeks ago. 3 more falls this week. Limited motility.  EXAM: LEFT KNEE - COMPLETE 4+ VIEW  COMPARISON:  03/18/2015  FINDINGS: Degenerative changes in the left knee  with tricompartment narrowing and osteophytosis. Diffuse bone demineralization. No evidence of acute fracture or dislocation. No focal bone lesion or bone destruction. No significant effusion. Soft tissues are unremarkable.  IMPRESSION: Tricompartment degenerative changes in the left knee. No acute fractures identified.   Electronically Signed   By: Lucienne Capers M.D.   On: 03/29/2015 01:06    Positive ROS: All other systems have been reviewed and were otherwise negative with the exception of those mentioned in the HPI and as above.  Labs cbc  Recent Labs  03/29/15 0032  WBC 5.2  HGB 13.3  HCT 41.4  PLT 171    Labs inflam No results for input(s): CRP in the last 72 hours.  Invalid input(s): ESR  Labs coag No results for input(s): INR, PTT in the last 72 hours.  Invalid input(s): PT   Recent Labs  03/29/15 0032  NA  141  K 4.0  CL 107  CO2 22  GLUCOSE 97  BUN 16  CREATININE 0.79  CALCIUM 9.2    Physical Exam: Filed Vitals:   03/29/15 0500  BP: 98/57  Pulse:   Temp:   Resp: 15   General: Alert, no acute distress Cardiovascular: No pedal edema Respiratory: No cyanosis, no use of accessory musculature GI: No organomegaly, abdomen is soft and non-tender Skin: No lesions in the area of chief complaint other than those listed below in MSK exam.  Neurologic: Sensation intact distally Psychiatric: Demented Lymphatic: No axillary or cervical lymphadenopathy  MUSCULOSKELETAL:  LLE: some swelling and TTp at the lateral joint line. Mild effusion. NVI distally.  Other extremities are atraumatic with painless ROM and NVI.  Assessment: Left non-displaced tibial plateau fx.   Plan: NWB LLE Knee immobilizer full time.  F/u in 2wks in my office    Renette Butters, MD Cell [phone]   03/29/2015 5:34 AM

## 2015-03-29 NOTE — Progress Notes (Signed)
Patient has refused to have head/neck ct done. MD made aware.

## 2015-03-29 NOTE — Consult Note (Signed)
Patient ID: [name] MRN: [id number], DOB/AGE: 07-26-1941   Admit date: 03/28/2015   Primary Physician: Janeece Agee, PA Primary Cardiologist: [facility]  Pt. Profile:  74 y/o female with a h/o atrial fibrillation, prior CVA on Pradaxa for anticoagulation, HTN, HLD, seizures and vascular dementia, admitted for left knee fracture after a mechanical fall at home. Cardiology consulted regarding: 1. Atrial Fibrillation with a pause and  2. Recommendations regarding continuation of long term oral anticoagulation.   Problem List  Past Medical History  Diagnosis Date  . Arthritis   . Atrial fibrillation   . CVA (cerebral infarction)   . Seizures   . A-fib 02/14/2015  . CVA (cerebral infarction) 02/14/2015  . GERD (gastroesophageal reflux disease) 02/14/2015  . GERD without esophagitis 02/14/2015  . Seizure 02/14/2015  . Complication of anesthesia     DIFFICULTY WAKING     Past Surgical History  Procedure Laterality Date  . Total hip arthroplasty    . Joint replacement    . Fracture surgery       Allergies  No Known Allergies  HPI  The patient is a 74 y/o moderately obese AAF with a history of atrial fibrillation, prior CVA, h/o vascular dementia, seizures, HTN and HLD. She is followed by [facility]. Her CHA2DS2 VASc score is 6 (HTN, Age>75, prior CVA, female sex). She is on Pradaxa. Denies h/o GIB. No h/o CAD. Her family reports multiple past cardioversions. Her afib is rate controlled on metoprolol. She lives at home with her son. She usually ambulates with the assistance of a walker.  About a week ago, she sustained a mechanical fall at home.  She was not using her walker at the time. She initially presented to [facility]. She had a CAT scan and x-ray that did not show any acute abnormality. She was sent home, but continued to have worsening pain and edema. She also suffered 3 additional falls at home w/o any head trama. Her family  decided to bring her to [facility] today for a second opinion. CT in the [facility] ED demonstrated a non-displaced left lateral tibial plateau fracture. She was seen by ortho who recommended NWB status and patient was placed in a knee immobilizer, with plans for OP ortho f/u in 2 weeks.  She was admitted by Internal Medicine. Her atrial fibrillation has remained well controlled on metoprolol. On telemetry earlier today, she was noted to have 2 pauses. One pause was 1.98 sec and the other 2.25 sec in duration. Patient denies any symptoms regarding any palpitations, dyspnea, syncope/ near syncope. Her only issue today has been leg pain. Cardiology has been consulted  regarding her pauses and also for recommendations regarding continuation of her Pradaxa. Her Pradaxa is currently on hold and she is on IV heparin.    Home Medications  Prior to Admission medications   Medication Sig Start Date End Date Taking? Authorizing Provider  acetaminophen (TYLENOL) 325 MG tablet Take 325 mg by mouth every 6 (six) hours as needed for moderate pain.   Yes Historical Provider, MD  aspirin EC 81 MG tablet Take 81 mg by mouth daily.     Yes Historical Provider, MD  Cholecalciferol 1000 UNITS capsule Take 1,000 Units by mouth daily.   Yes Historical Provider, MD  dabigatran (PRADAXA) 150 MG CAPS capsule Take 150 mg by mouth 2 (two) times daily.   Yes Historical Provider, MD  diclofenac sodium (VOLTAREN) 1 % GEL Apply 2 g topically 4 (four)  times daily.   Yes Historical Provider, MD  doxycycline (DORYX) 100 MG EC tablet Take 100 mg by mouth 2 (two) times daily.   Yes Historical Provider, MD  esomeprazole (NEXIUM) 40 MG capsule Take 40 mg by mouth daily before breakfast.     Yes Historical Provider, MD  furosemide (LASIX) 20 MG tablet Take 20 mg by mouth daily.     Yes Historical Provider, MD  ketotifen (ZADITOR) 0.025 % ophthalmic solution Place 1 drop into both eyes daily as needed (for dry eyes).   Yes Historical Provider, MD    levETIRAcetam (KEPPRA) 500 MG tablet Take 500 mg by mouth 2 (two) times daily.     Yes Historical Provider, MD  LORazepam (ATIVAN) 1 MG tablet Take 1 mg by mouth every 8 (eight) hours as needed for anxiety or sleep.    Yes Historical Provider, MD  metoprolol (LOPRESSOR) 50 MG tablet Take 50 mg by mouth daily.     Yes Historical Provider, MD  niacin 500 MG tablet Take 500 mg by mouth daily with breakfast.     Yes Historical Provider, MD  potassium chloride (KLOR-CON) 10 MEQ CR tablet Take 10 mEq by mouth daily.     Yes Historical Provider, MD  QUEtiapine (SEROQUEL) 50 MG tablet Take 100 mg by mouth at bedtime.    Yes Historical Provider, MD  simvastatin (ZOCOR) 40 MG tablet Take 40 mg by mouth at bedtime.     Yes Historical Provider, MD  traMADol (ULTRAM) 50 MG tablet Take 50 mg by mouth every 8 (eight) hours as needed (pain).   Yes Historical Provider, MD    Family History  Family History  Problem Relation Age of Onset  . Atrial fibrillation Sister     Social History  History   Social History  . Marital Status: Widowed    Spouse Name: N/A  . Number of Children: N/A  . Years of Education: N/A   Occupational History  . Not on file.   Social History Main Topics  . Smoking status: Never Smoker   . Smokeless tobacco: Never Used  . Alcohol Use: No  . Drug Use: No  . Sexual Activity: No   Other Topics Concern  . Not on file   Social History Narrative     Review of Systems General:  No chills, fever, night sweats or weight changes.  Cardiovascular:  No chest pain, dyspnea on exertion, edema, orthopnea, palpitations, paroxysmal nocturnal dyspnea. Dermatological: No rash, lesions/masses Respiratory: No cough, dyspnea Urologic: No hematuria, dysuria Abdominal:   No nausea, vomiting, diarrhea, bright red blood per rectum, melena, or hematemesis Neurologic:  No visual changes, wkns, changes in mental status. All other systems reviewed and are otherwise negative except as noted  above.  Physical Exam  Blood pressure 128/70, pulse 58, temperature 98 F (36.7 C), temperature source Oral, resp. rate 18, height 5\' 4"  (1.626 m), weight 384 lb 7.7 oz (174.4 kg), SpO2 91 %.  General: Pleasant, NAD Psych: Normal affect. Neuro: Alert and oriented X 3. Moves all extremities spontaneously. HEENT: Normal  Neck: Supple without bruits or JVD. Lungs:  Resp regular and unlabored, CTA. Heart: irregularly irregular rhythm no s3, s4, or murmurs. Abdomen: Soft, non-tender, non-distended, BS + x 4.  Extremities: No clubbing, or cyanosis. LLE edema. No edema on the right. DP/PT/Radials 2+ and equal bilaterally.  Labs  Troponin (Point of Care Test) No results for input(s): TROPIPOC in the last 72 hours. No results for input(s): CKTOTAL, CKMB, TROPONINI  in the last 72 hours. Lab Results  Component Value Date   WBC 5.1 2015/04/16   HGB 12.7 04/16/15   HCT 39.5 04-16-2015   MCV 92.9 2015-04-16   PLT 177 16-Apr-2015     Recent Labs Lab 04/16/2015 0725  NA 139  K 3.9  CL 106  CO2 23  BUN 15  CREATININE 0.75  CALCIUM 8.9  PROT 6.8  BILITOT 1.3*  ALKPHOS 86  ALT 17  AST 19  GLUCOSE 99   Lab Results  Component Value Date   CHOL  05/11/2009    108        ATP III CLASSIFICATION:  <200     mg/dL   Desirable  200-239  mg/dL   Borderline High  >=240    mg/dL   High          HDL 43 05/11/2009   LDLCALC  05/11/2009    54        Total Cholesterol/HDL:CHD Risk Coronary Heart Disease Risk Table                     Men   Women  1/2 Average Risk   3.4   3.3  Average Risk       5.0   4.4  2 X Average Risk   9.6   7.1  3 X Average Risk  23.4   11.0        Use the calculated Patient Ratio above and the CHD Risk Table to determine the patient's CHD Risk.        ATP III CLASSIFICATION (LDL):  <100     mg/dL   Optimal  100-129  mg/dL   Near or Above                    Optimal  130-159  mg/dL   Borderline  160-189  mg/dL   High  >190     mg/dL   Very High   TRIG  57 05/11/2009   No results found for: DDIMER   Radiology/Studies  Dg Ankle Complete Left  Apr 16, 2015   CLINICAL DATA:  [place] 3 weeks ago and fell repeatedly this week.  EXAM: LEFT ANKLE COMPLETE - 3+ VIEW  COMPARISON:  None.  FINDINGS: There is a small calcification at the medial aspect of the talus which more likely is not an acute fracture. It appears to be corticated. There is no acute fracture. The mortise is symmetric. Moderate degenerative changes are present at the talonavicular articulation.  IMPRESSION: Negative for acute fracture   Electronically Signed   By: Andreas Newport M.D.   On: 2015/04/16 01:07   Ct Knee Left Wo Contrast  Apr 16, 2015   CLINICAL DATA:  Fall 10 days ago in was seen in ED. Continued pain and swelling since then.  EXAM: CT OF THE left KNEE WITHOUT CONTRAST  TECHNIQUE: Multidetector CT imaging of the left knee was performed according to the standard protocol. Multiplanar CT image reconstructions were also generated.  COMPARISON:  Left knee radiographs 16-Apr-2015 and 03/18/2015.  FINDINGS: Mild subcutaneous soft tissue edema about the left kidney. Minimal effusion. Vascular calcifications. Tricompartment degenerative changes in the left knee. Diffuse bone demineralization. Suggestion of vague linear lucency with possible cortical step-off along the lateral tibial plateau. This could represent nondisplaced tibial plateau fracture. Suggest MRI for further evaluation. Distal femur and patella appear intact. No focal bone lesion or bone destruction identified.  IMPRESSION: Possible nondisplaced lateral tibial plateau fracture.  Suggest MRI for further evaluation. Tricompartment degenerative changes. Mild edema. Minimal effusion.   Electronically Signed   By: Lucienne Capers M.D.   On: 03/29/2015 02:58   Ct Hip Left Wo Contrast  03/19/2015   CLINICAL DATA:  Status post fall. Persistent left hip pain and difficulty bearing weight. Initial encounter.  EXAM: CT OF THE LEFT HIP  WITHOUT CONTRAST  TECHNIQUE: Multidetector CT imaging of the left hip was performed according to the standard protocol. Multiplanar CT image reconstructions were also generated.  COMPARISON:  Left hip radiographs performed earlier today at 2:00 a.m.  FINDINGS: There is no evidence of fracture or dislocation. The left femoral neck is unremarkable in appearance. The left femoral head remains seated at the acetabulum. Mild axial joint space narrowing is noted. Minimal associated subcortical cystic change is noted at the femoral head and acetabulum.  The left superior and inferior pubic rami are within normal limits. The pubic symphysis is unremarkable in appearance. The left sacroiliac joint demonstrates vacuum phenomenon.  Stool is noted partially filling the visualized portions of the colon. Scattered vascular calcifications are seen. A metallic density is noted at the level of the perineum.  IMPRESSION: 1. No evidence of fracture or dislocation. 2. Mild axial joint space narrowing at the left hip. Minimal subcortical cystic change seen at the femoral head and acetabulum. 3. Scattered vascular calcifications seen.   Electronically Signed   By: Garald Balding M.D.   On: 03/19/2015 05:44   Ct Ankle Left Wo Contrast  03/29/2015   CLINICAL DATA:  Patient fell about 10 days ago and was seen in the ED. Continued pain and swelling.  EXAM: CT OF THE LEFT ANKLE WITHOUT CONTRAST  TECHNIQUE: Multidetector CT imaging of the left ankle was performed according to the standard protocol. Multiplanar CT image reconstructions were also generated.  COMPARISON:  Left ankle radiographs 03/29/2015  FINDINGS: Diffuse soft tissue edema throughout the subcutaneous fat of the left ankle, most prominent anteriorly and laterally. Vascular calcifications are present in the anterior and posterior tibial arteries.  Degenerative changes in the left ankle. Degenerative changes in the visualized intertarsal joints. No evidence of acute fracture or  dislocation. Ankle mortise and talar dome appear intact. No focal bone lesion or bone destruction. Bone cortex and trabecular architecture appear intact. 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  IMPRESSION: Diffuse swelling about the left ankle. Degenerative changes. No acute fracture or dislocation.   Electronically Signed   By: Lucienne Capers M.D.   On: 03/29/2015 02:53   Dg Knee 4 Views W/patella Left  03/19/2015   CLINICAL DATA:  Diffuse knee pain after losing balance and falling this morning.  EXAM: LEFT KNEE - COMPLETE 4+ VIEW  COMPARISON:  None.  FINDINGS: No acute fracture deformity. No dislocation. Moderate tricompartmental joint space narrowing, marginal spurring consistent with osteoarthrosis. Osteopenia without destructive bony lesions. Small suprapatellar joint effusion, infra patella soft tissue swelling without subcutaneous gas or radiopaque foreign bodies.  IMPRESSION: No acute fracture deformity or dislocation though, patient is osteopenic which decreases sensitivity for acute nondisplaced fractures.  Soft tissue swelling with small suprapatellar joint effusion.  Moderate tricompartmental osteoarthrosis.   Electronically Signed   By: Elon Alas   On: 03/19/2015 00:41   Dg Knee Complete 4 Views Left  03/29/2015   CLINICAL DATA:  Left ankle and knee pain since a fall 3 weeks ago. 3 more falls this week. Limited motility.  EXAM: LEFT KNEE - COMPLETE 4+ VIEW  COMPARISON:  03/18/2015  FINDINGS: Degenerative changes in the left knee with tricompartment narrowing and osteophytosis. Diffuse bone demineralization. No evidence of acute fracture or dislocation. No focal bone lesion or bone destruction. No significant effusion. Soft tissues are unremarkable.  IMPRESSION: Tricompartment degenerative changes in the left knee. No acute fractures identified.   Electronically Signed   By: Lucienne Capers M.D.   On: 03/29/2015 01:06    ECG  Atrial fibrillation with  a CVR in the 60s.    ASSESSMENT AND PLAN  Principal Problem:   Knee fracture, left Active Problems:   Altered mental status   A-fib   CVA (cerebral infarction)   GERD (gastroesophageal reflux disease)   Dementia   UTI (lower urinary tract infection)    1. Atrial Fibrillation w/ Pauses: rate is controlled on Metoprolol. Resting HR in the 60s.  She did have a 1.98 and 2.25 sec pause earlier today on telemetry with rate dropping in the 30s but recovered quickly back into the 60s. Patient remained asymptomatic. She is on 50 mg of metoprolol once daily. No other AVN blocking agents. Not overly excited about 2 sec pauses. This may have been vagally mediated caused by pain. Continue to monitor on telemetry. If any further recurrence, can opt for further evaluation with outpatient monitor. Call if recurrent pauses of longer duration. She has been on Pradaxa for stroke prophylaxis. Now on IV heparin.   2. Long Term Oral Anticoagulation: has atrial fibrillation with elevated CHA2DS2 VASc score of 6 (HTN, Age>75, prior CVA and female sex). Thus her risk for recurrent stroke is high. She is also at an increased risk of bleed due to her h/o falls, advanced age and dementia. She understands her risk of CVA if Pradaxa is discontinued and also understands her risk for bleed if traumatic fall involving her head. Her family would prefer for her to stay on Pradaxa to prevent recurrent stroke. MD to discuss with patient and family further. If she decides to continue with anticoagulation, recommend switching to Eliquis as this has a lower bleed risk.   3. Left Lateral Tibial Plaeau Fx: management per ortho.   Signed, Lyda Jester, PA-C 03/29/2015, 4:19 PM  Patient seen and examined with Lyda Jester, PA-C. We discussed all aspects of the encounter. I agree with the assessment and plan as stated above.    74 y/o woman with AF and previous CVA with CHADSVaSc = 6 admitted with fall and left tibial  fracture. She has been on pradaxa 150 bid through [facility] x 3 years. Consult called to help decide on appropriateness of ongoing anticoagulation. Her sisters were not in the room when I came to see her. Ms. Cramer is adamant about staying on a blood thinner because her mother had a stroke when the stopped hers. Her sisters apparently share this conviction despite her recent fall. We discussed risks/inidcations of bleeding/stroke particularly in setting of recent fall. With CHADSVaSc = 6 she has a 10% yearly risk of stroke. Given this risk and the fact that she wants to continue anti-coagulation (which I do not think is unreasonable) would consider switching to apixaban 5 bid which has better evidence for lower bleeding risk when compared to warfarin. We can discuss this with her sisters and decide on appropriate strategy prior to d/c.   Atif Chapple,MD 4:47 PM

## 2015-03-29 NOTE — Progress Notes (Signed)
ANTICOAGULATION CONSULT NOTE - Follow Up Consult  Pharmacy Consult for Heparin Indication: atrial fibrillation  No Known Allergies  Patient Measurements: Height: 5\' 4"  (162.6 cm) Weight: (!) 384 lb 7.7 oz (174.4 kg) IBW/kg (Calculated) : 54.7 Heparin Dosing Weight: 110 kg  Vital Signs: Temp: 98 F (36.7 C) (05/10 0832) Temp Source: Oral (05/10 0832) BP: 128/70 mmHg (05/10 0832) Pulse Rate: 58 (05/10 0800)  Labs:  Recent Labs  03/29/15 0032 03/29/15 0725 03/29/15 1438  HGB 13.3 12.7  --   HCT 41.4 39.5  --   PLT 171 177  --   APTT  --  49*  --   LABPROT  --  19.7*  --   INR  --  1.65*  --   HEPARINUNFRC  --   --  0.23*  CREATININE 0.79 0.75  --     Estimated Creatinine Clearance: 98.4 mL/min (by C-G formula based on Cr of 0.75).  Assessment:   74yo female fell ~ 2 times in the last 10 days. She reported progressive pain in LLE associated w/ swelling and bruising, and was found to have left non-displaced tibial plateau fracture. No surgery, treating conservatively.    She had been taking Pradaxa 150 mg BID prior to admission for Afib.  Last dose of Pradaxa unclear but arrived in ED ~2200 on 03/28/15, so had not had any in at least 10hr.  Pradaxa on hold.   Heparin drip begun ~10am today at 1500 units/hr.  First heparin level is subtherapeutic (0.23), drawn only ~ 4.5 hours after drip begun, so may trend up a bit by steady state.  Cardiology consulted for long-term AC risk vs benefit.  Goal of Therapy:  Heparin level 0.3-0.5 units/ml Monitor platelets by anticoagulation protocol: Yes   Plan:   Increase heparin drip conservatively to 1650 units/hr.  Next heparin level in ~ 6hrs.   Will target lower end of therapeutic range.  Daily heparin level and CBC while on heparin.  Will follow to transition to oral anticoagulation.  Arty Baumgartner, [place] Pager: [phone] 03/29/2015,6:07 PM

## 2015-03-29 NOTE — H&P (Signed)
[facility] History and Physical  Patient: [name]  MRN: [id number]  DOB: 1941-11-14  DOS: the patient was seen and examined on 03/29/2015 PCP: Janeece Agee, PA  Referring physician: Dr. Claudine Mouton Chief Complaint: Fall  HPI: [name] is a 74 y.o. female with Past medical history of A. fib, CVA, seizures, dementia, GERD. The patient is presenting with complaints of fall. The history was updated ED documentation as the patient is unable to provide me any history Due to confusion and being drowsy. Patient had a fall 10 days ago in her driveway and was evaluated at [facility]. She had a CAT scan and x-ray that did not show any acute abnormality. Since the fall she had progressive worsening in his left leg. She had swelling and bruising of that leg as well. He also has difficulty with ambulation and was mostly bedbound. She has history of dementia and is on pradaxa and has been compliant with that. As per documentation she has history of dementia as well. She was started on doxycycline 2 days ago. For UTI. Patient lives with her son but has been having significant difficulty ambulating and stand on her own and therefore she has been referred for admission to arrange for adequate support prior to discharge.  The patient is coming from home. And at her baseline independent for most of her ADL.  Review of Systems: as mentioned in the history of present illness.  A comprehensive review of the other systems is negative.  Past Medical History  Diagnosis Date  . Arthritis   . Atrial fibrillation   . CVA (cerebral infarction)   . Seizures   . A-fib 02/14/2015  . CVA (cerebral infarction) 02/14/2015  . GERD (gastroesophageal reflux disease) 02/14/2015  . GERD without esophagitis 02/14/2015  . Seizure 02/14/2015   Past Surgical History  Procedure Laterality Date  . Total hip arthroplasty    . Joint replacement    . Fracture surgery     Social History:  reports that she  has never smoked. She does not have any smokeless tobacco history on file. She reports that she does not drink alcohol or use illicit drugs.  No Known Allergies  No family history on file.  Prior to Admission medications   Medication Sig Start Date End Date Taking? Authorizing Provider  acetaminophen (TYLENOL) 325 MG tablet Take 325 mg by mouth every 6 (six) hours as needed for moderate pain.    Historical Provider, MD  aspirin EC 81 MG tablet Take 81 mg by mouth daily.      Historical Provider, MD  calcium elemental as carbonate (BARIATRIC TUMS ULTRA) 400 MG tablet Chew 1,000 mg by mouth 2 (two) times daily.      Historical Provider, MD  Cholecalciferol 1000 UNITS capsule Take 1,000 Units by mouth daily.    Historical Provider, MD  dabigatran (PRADAXA) 150 MG CAPS capsule Take 150 mg by mouth 2 (two) times daily.    Historical Provider, MD  esomeprazole (NEXIUM) 40 MG capsule Take 40 mg by mouth daily before breakfast.      Historical Provider, MD  furosemide (LASIX) 20 MG tablet Take 20 mg by mouth daily.      Historical Provider, MD  ketotifen (ZADITOR) 0.025 % ophthalmic solution Place 1 drop into both eyes daily as needed (for dry eyes).    Historical Provider, MD  levETIRAcetam (KEPPRA) 500 MG tablet Take 500 mg by mouth 2 (two) times daily.      Historical  Provider, MD  metoprolol (LOPRESSOR) 50 MG tablet Take 50 mg by mouth daily.      Historical Provider, MD  niacin 500 MG tablet Take 500 mg by mouth daily with breakfast.      Historical Provider, MD  potassium chloride (KLOR-CON) 10 MEQ CR tablet Take 10 mEq by mouth daily.      Historical Provider, MD  QUEtiapine (SEROQUEL) 25 MG tablet Take 50 mg by mouth at bedtime.  10/14/14 02/14/15  Historical Provider, MD  simvastatin (ZOCOR) 40 MG tablet Take 40 mg by mouth at bedtime.      Historical Provider, MD    Physical Exam: Filed Vitals:   03/29/15 0300 03/29/15 0400 03/29/15 0500 03/29/15 0700  BP: 102/57 102/61 98/57 113/64    Pulse:      Temp:      TempSrc:      Resp: 15 15 15 14   Height:    5\' 4"  (1.626 m)  Weight:    174.4 kg (384 lb 7.7 oz)  SpO2:        General: Alert, Awake and following commands, Appear in mild distress Eyes: PERRL ENT: Oral Mucosa clear moist. Neck: no JVD Cardiovascular: S1 and S2 Present, no Murmur, Peripheral Pulses Present Respiratory: Bilateral Air entry equal and Decreased,  Clear to Auscultation, bno Crackles, no wheezes Abdomen: Bowel Sound present, Soft and non tender Skin: no Rash Extremities: Left Pedal edema, no calf tenderness Neurologic: Grossly no focal neuro deficit.  Labs on Admission:  CBC:  Recent Labs Lab 03/29/15 0032  WBC 5.2  NEUTROABS 2.9  HGB 13.3  HCT 41.4  MCV 93.2  PLT 171    CMP     Component Value Date/Time   NA 141 03/29/2015 0032   NA 141 03/19/2015 0758   K 4.0 03/29/2015 0032   K 4.1 03/19/2015 0758   CL 107 03/29/2015 0032   CL 108 03/19/2015 0758   CO2 22 03/29/2015 0032   CO2 27 03/19/2015 0758   GLUCOSE 97 03/29/2015 0032   GLUCOSE 111* 03/19/2015 0758   BUN 16 03/29/2015 0032   BUN 19 03/19/2015 0758   CREATININE 0.79 03/29/2015 0032   CREATININE 0.80 03/19/2015 0758   CALCIUM 9.2 03/29/2015 0032   CALCIUM 9.1 03/19/2015 0758   PROT 7.4 03/29/2015 0032   PROT 8.0 03/19/2015 0758   ALBUMIN 3.4* 03/29/2015 0032   ALBUMIN 4.0 03/19/2015 0758   AST 18 03/29/2015 0032   AST 24 03/19/2015 0758   ALT 18 03/29/2015 0032   ALT 18 03/19/2015 0758   ALKPHOS 97 03/29/2015 0032   ALKPHOS 91 03/19/2015 0758   BILITOT 1.2 03/29/2015 0032   GFRNONAA >60 03/29/2015 0032   GFRNONAA >60 03/19/2015 0758   GFRAA >60 03/29/2015 0032   GFRAA >60 03/19/2015 0758    No results for input(s): LIPASE, AMYLASE in the last 168 hours.  No results for input(s): CKTOTAL, CKMB, CKMBINDEX, TROPONINI in the last 168 hours. BNP (last 3 results) No results for input(s): BNP in the last 8760 hours.  ProBNP (last 3 results)  Recent  Labs  11/06/14 0334  PROBNP 1923.0*     Radiological Exams on Admission: Dg Ankle Complete Left  03/29/2015   CLINICAL DATA:  [place] 3 weeks ago and fell repeatedly this week.  EXAM: LEFT ANKLE COMPLETE - 3+ VIEW  COMPARISON:  None.  FINDINGS: There is a small calcification at the medial aspect of the talus which more likely is not an acute fracture. It  appears to be corticated. There is no acute fracture. The mortise is symmetric. Moderate degenerative changes are present at the talonavicular articulation.  IMPRESSION: Negative for acute fracture   Electronically Signed   By: Andreas Newport M.D.   On: 03/29/2015 01:07   Ct Knee Left Wo Contrast  03/29/2015   CLINICAL DATA:  Fall 10 days ago in was seen in ED. Continued pain and swelling since then.  EXAM: CT OF THE left KNEE WITHOUT CONTRAST  TECHNIQUE: Multidetector CT imaging of the left knee was performed according to the standard protocol. Multiplanar CT image reconstructions were also generated.  COMPARISON:  Left knee radiographs 03/29/2015 and 03/18/2015.  FINDINGS: Mild subcutaneous soft tissue edema about the left kidney. Minimal effusion. Vascular calcifications. Tricompartment degenerative changes in the left knee. Diffuse bone demineralization. Suggestion of vague linear lucency with possible cortical step-off along the lateral tibial plateau. This could represent nondisplaced tibial plateau fracture. Suggest MRI for further evaluation. Distal femur and patella appear intact. No focal bone lesion or bone destruction identified.  IMPRESSION: Possible nondisplaced lateral tibial plateau fracture. Suggest MRI for further evaluation. Tricompartment degenerative changes. Mild edema. Minimal effusion.   Electronically Signed   By: Lucienne Capers M.D.   On: 03/29/2015 02:58   Ct Ankle Left Wo Contrast  03/29/2015   CLINICAL DATA:  Patient fell about 10 days ago and was seen in the ED. Continued pain and swelling.  EXAM: CT OF THE LEFT ANKLE  WITHOUT CONTRAST  TECHNIQUE: Multidetector CT imaging of the left ankle was performed according to the standard protocol. Multiplanar CT image reconstructions were also generated.  COMPARISON:  Left ankle radiographs 03/29/2015  FINDINGS: Diffuse soft tissue edema throughout the subcutaneous fat of the left ankle, most prominent anteriorly and laterally. Vascular calcifications are present in the anterior and posterior tibial arteries.  Degenerative changes in the left ankle. Degenerative changes in the visualized intertarsal joints. No evidence of acute fracture or dislocation. Ankle mortise and talar dome appear intact. No focal bone lesion or bone destruction. Bone cortex and trabecular architecture appear intact. 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  IMPRESSION: Diffuse swelling about the left ankle. Degenerative changes. No acute fracture or dislocation.   Electronically Signed   By: Lucienne Capers M.D.   On: 03/29/2015 02:53   Dg Knee Complete 4 Views Left  03/29/2015   CLINICAL DATA:  Left ankle and knee pain since a fall 3 weeks ago. 3 more falls this week. Limited motility.  EXAM: LEFT KNEE - COMPLETE 4+ VIEW  COMPARISON:  03/18/2015  FINDINGS: Degenerative changes in the left knee with tricompartment narrowing and osteophytosis. Diffuse bone demineralization. No evidence of acute fracture or dislocation. No focal bone lesion or bone destruction. No significant effusion. Soft tissues are unremarkable.  IMPRESSION: Tricompartment degenerative changes in the left knee. No acute fractures identified.   Electronically Signed   By: Lucienne Capers M.D.   On: 03/29/2015 01:06    Assessment/Plan Principal Problem:   Knee fracture, left Active Problems:   Altered mental status   A-fib   CVA (cerebral infarction)   GERD (gastroesophageal reflux disease)   Dementia   UTI (lower urinary tract infection)   1. Knee fracture, left The patient is presenting with a  fall that happened 10 days ago. She had a scan of the hip and x-ray knee which did not show any acute abnormality but she continued to have pain and swelling of the left leg and  therefore she was brought to the ER again today. She had a CT scan of her knee as well as ankle which did show possible tibial fracture for which orthopedic has been consulted who will be following up with the patient. Currently she'll be closely monitored in the hospital and will follow orthopedic recommendation. She remains nothing by mouth.  2. Acute encephalopathy. UTI. The Patient Is Presenting with Acute Encephalopathy and UTI Will Continue Her on Ceftriaxone. Recent workup was unremarkable. Most likely progression off the dementia. The CT of the head and C-spine to rule out any acute abnormality. Holding off on narcotics and other medications in the setting of acute encephalopathy. Patient remains nothing by mouth.  3. Left lower leg swelling. We get an ultrasound to rule out DVT.  4. A. fib. On anticoagulation with pradaxa. Holding off on Pradaxa. Placing her on heparin at present, she does have history of CVA as well as A. fib and therefore I'm continuing her on full anticoagulation.  5. History of seizures. Continue Keppra  Advance goals of care discussion: Full code presumed   Consults: ED physician discussed with Dr. Percell Miller from orthopedics.  DVT Prophylaxis: on chronic therapeutic anticoagulation. Nutrition: Nothing by mouth  Disposition: Admitted as observation, telemetry unit.  Author: Berle Mull, MD [facility] Pager: [phone] 03/29/2015  If 7PM-7AM, please contact night-coverage [email or web address] Password [email or web address]

## 2015-03-30 ENCOUNTER — Ambulatory Visit (HOSPITAL_COMMUNITY): Payer: Medicare Other

## 2015-03-30 DIAGNOSIS — Z9181 History of falling: Secondary | ICD-10-CM | POA: Diagnosis not present

## 2015-03-30 DIAGNOSIS — S82892S Other fracture of left lower leg, sequela: Secondary | ICD-10-CM | POA: Diagnosis not present

## 2015-03-30 DIAGNOSIS — F0390 Unspecified dementia without behavioral disturbance: Secondary | ICD-10-CM | POA: Diagnosis not present

## 2015-03-30 DIAGNOSIS — Z6841 Body Mass Index (BMI) 40.0 and over, adult: Secondary | ICD-10-CM | POA: Diagnosis not present

## 2015-03-30 DIAGNOSIS — Z7401 Bed confinement status: Secondary | ICD-10-CM | POA: Diagnosis not present

## 2015-03-30 DIAGNOSIS — W19XXXA Unspecified fall, initial encounter: Secondary | ICD-10-CM | POA: Diagnosis present

## 2015-03-30 DIAGNOSIS — G9340 Encephalopathy, unspecified: Secondary | ICD-10-CM | POA: Diagnosis present

## 2015-03-30 DIAGNOSIS — S82142A Displaced bicondylar fracture of left tibia, initial encounter for closed fracture: Secondary | ICD-10-CM | POA: Diagnosis present

## 2015-03-30 DIAGNOSIS — F0150 Vascular dementia without behavioral disturbance: Secondary | ICD-10-CM | POA: Diagnosis present

## 2015-03-30 DIAGNOSIS — F22 Delusional disorders: Secondary | ICD-10-CM | POA: Diagnosis present

## 2015-03-30 DIAGNOSIS — E785 Hyperlipidemia, unspecified: Secondary | ICD-10-CM | POA: Diagnosis present

## 2015-03-30 DIAGNOSIS — Z8673 Personal history of transient ischemic attack (TIA), and cerebral infarction without residual deficits: Secondary | ICD-10-CM | POA: Diagnosis not present

## 2015-03-30 DIAGNOSIS — M7989 Other specified soft tissue disorders: Secondary | ICD-10-CM | POA: Diagnosis not present

## 2015-03-30 DIAGNOSIS — I481 Persistent atrial fibrillation: Secondary | ICD-10-CM

## 2015-03-30 DIAGNOSIS — K219 Gastro-esophageal reflux disease without esophagitis: Secondary | ICD-10-CM | POA: Diagnosis present

## 2015-03-30 DIAGNOSIS — N390 Urinary tract infection, site not specified: Secondary | ICD-10-CM | POA: Diagnosis present

## 2015-03-30 DIAGNOSIS — E669 Obesity, unspecified: Secondary | ICD-10-CM | POA: Diagnosis present

## 2015-03-30 DIAGNOSIS — Z96649 Presence of unspecified artificial hip joint: Secondary | ICD-10-CM | POA: Diagnosis present

## 2015-03-30 DIAGNOSIS — Z7982 Long term (current) use of aspirin: Secondary | ICD-10-CM | POA: Diagnosis not present

## 2015-03-30 DIAGNOSIS — I4891 Unspecified atrial fibrillation: Secondary | ICD-10-CM | POA: Diagnosis present

## 2015-03-30 DIAGNOSIS — R569 Unspecified convulsions: Secondary | ICD-10-CM | POA: Diagnosis present

## 2015-03-30 DIAGNOSIS — M79605 Pain in left leg: Secondary | ICD-10-CM | POA: Diagnosis present

## 2015-03-30 DIAGNOSIS — Z7901 Long term (current) use of anticoagulants: Secondary | ICD-10-CM | POA: Diagnosis not present

## 2015-03-30 DIAGNOSIS — G4700 Insomnia, unspecified: Secondary | ICD-10-CM | POA: Diagnosis present

## 2015-03-30 DIAGNOSIS — I10 Essential (primary) hypertension: Secondary | ICD-10-CM | POA: Diagnosis present

## 2015-03-30 DIAGNOSIS — M1990 Unspecified osteoarthritis, unspecified site: Secondary | ICD-10-CM | POA: Diagnosis present

## 2015-03-30 DIAGNOSIS — W19XXXS Unspecified fall, sequela: Secondary | ICD-10-CM

## 2015-03-30 LAB — BASIC METABOLIC PANEL
ANION GAP: 10 (ref 5–15)
BUN: 15 mg/dL (ref 6–20)
CHLORIDE: 108 mmol/L (ref 101–111)
CO2: 20 mmol/L — ABNORMAL LOW (ref 22–32)
Calcium: 8.6 mg/dL — ABNORMAL LOW (ref 8.9–10.3)
Creatinine, Ser: 0.73 mg/dL (ref 0.44–1.00)
Glucose, Bld: 109 mg/dL — ABNORMAL HIGH (ref 70–99)
POTASSIUM: 4 mmol/L (ref 3.5–5.1)
SODIUM: 138 mmol/L (ref 135–145)

## 2015-03-30 LAB — CBC
HEMATOCRIT: 36.6 % (ref 36.0–46.0)
Hemoglobin: 12 g/dL (ref 12.0–15.0)
MCH: 30.2 pg (ref 26.0–34.0)
MCHC: 32.8 g/dL (ref 30.0–36.0)
MCV: 92 fL (ref 78.0–100.0)
PLATELETS: 181 10*3/uL (ref 150–400)
RBC: 3.98 MIL/uL (ref 3.87–5.11)
RDW: 12 % (ref 11.5–15.5)
WBC: 5.9 10*3/uL (ref 4.0–10.5)

## 2015-03-30 LAB — RAPID URINE DRUG SCREEN, HOSP PERFORMED
Amphetamines: NOT DETECTED
BARBITURATES: NOT DETECTED
BENZODIAZEPINES: NOT DETECTED
Cocaine: NOT DETECTED
Opiates: POSITIVE — AB
Tetrahydrocannabinol: NOT DETECTED

## 2015-03-30 LAB — HEPARIN LEVEL (UNFRACTIONATED)
Heparin Unfractionated: 0.43 IU/mL (ref 0.30–0.70)
Heparin Unfractionated: 0.89 IU/mL — ABNORMAL HIGH (ref 0.30–0.70)

## 2015-03-30 MED ORDER — DONEPEZIL HCL 5 MG PO TABS
5.0000 mg | ORAL_TABLET | Freq: Every day | ORAL | Status: DC
  Administered 2015-03-30 – 2015-03-31 (×2): 5 mg via ORAL
  Filled 2015-03-30 (×3): qty 1

## 2015-03-30 MED ORDER — APIXABAN 5 MG PO TABS
5.0000 mg | ORAL_TABLET | Freq: Two times a day (BID) | ORAL | Status: DC
  Administered 2015-03-30 – 2015-04-01 (×5): 5 mg via ORAL
  Filled 2015-03-30 (×6): qty 1

## 2015-03-30 MED ORDER — HYDROCODONE-ACETAMINOPHEN 5-325 MG PO TABS
1.0000 | ORAL_TABLET | ORAL | Status: DC | PRN
  Administered 2015-03-30: 2 via ORAL
  Administered 2015-03-30 – 2015-03-31 (×4): 1 via ORAL
  Administered 2015-04-01 (×2): 2 via ORAL
  Filled 2015-03-30: qty 1
  Filled 2015-03-30 (×2): qty 2
  Filled 2015-03-30 (×2): qty 1
  Filled 2015-03-30 (×2): qty 2

## 2015-03-30 MED ORDER — QUETIAPINE FUMARATE 25 MG PO TABS
25.0000 mg | ORAL_TABLET | Freq: Every day | ORAL | Status: DC
  Administered 2015-03-30 – 2015-03-31 (×2): 25 mg via ORAL
  Filled 2015-03-30 (×4): qty 1

## 2015-03-30 NOTE — Progress Notes (Addendum)
ANTICOAGULATION CONSULT NOTE - Follow Up Consult  Pharmacy Consult for Heparin>> apixiban Indication: atrial fibrillation  No Known Allergies  Patient Measurements: Height: 5\' 4"  (162.6 cm) Weight: (!) 384 lb 7.7 oz (174.4 kg) IBW/kg (Calculated) : 54.7 Vital Signs: Temp: 98.2 F (36.8 C) (05/11 0341) Temp Source: Oral (05/11 0341) BP: 122/95 mmHg (05/11 0341) Pulse Rate: 74 (05/11 0341)  Labs:  Recent Labs  03/29/15 0032 03/29/15 0725 03/29/15 1438 03/29/15 2345 03/30/15 0231  HGB 13.3 12.7  --   --  12.0  HCT 41.4 39.5  --   --  36.6  PLT 171 177  --   --  181  APTT  --  49*  --   --   --   LABPROT  --  19.7*  --   --   --   INR  --  1.65*  --   --   --   HEPARINUNFRC  --   --  0.23* 0.43  --   CREATININE 0.79 0.75  --   --  0.73    Estimated Creatinine Clearance: 98.4 mL/min (by C-G formula based on Cr of 0.73).    Assessment: 74 yo female with afib on pradaxa PTA. She had a fall (with tibial plateau fracture) and pradaxa has been on hold. She is currently on heparin and noted at goal (HL= 0.43).  Cardiology has recommended change to apixiban due to a lower bleeding risk. Pharmacy has been consulted to dose apixiban. SCr= 0.73, CrCl ~ 95 and wt= 174kg.  She is also on rocephin for UTI with acute encephalopathy. WBC= 12, afeb, and no cultures noted.   Goal of Therapy:  Heparin level 0.3-0.5 units/mL Monitor platelets by anticoagulation protocol: Yes   Plan:  -Begin apixiban 5mg  po bid -Discontinue heparin when apixiban starts -Will provide education with family -Continue rocephin 2gm IV q24hr (higher dose due to patient weight). No dose changes anticipated (not further pharmacy notes for rocephin are planned).  Hildred Laser, Pharm D 03/30/2015 11:03 AM

## 2015-03-30 NOTE — Clinical Social Work Placement (Signed)
   CLINICAL SOCIAL WORK PLACEMENT  NOTE  Date:  03/30/2015  Patient Details  Name: [name] MRN: [id number] Date of Birth: Oct 20, 1941  Clinical Social Work is seeking post-discharge placement for this patient at the [place] level of care (*CSW will initial, date and re-position this form in  chart as items are completed):  Yes   Patient/family provided with [facility] Work Department's list of facilities offering this level of care within the geographic area requested by the patient (or if unable, by the patient's family).  Yes   Patient/family informed of their freedom to choose among providers that offer the needed level of care, that participate in Medicare, Medicaid or managed care program needed by the patient, have an available bed and are willing to accept the patient.  Yes   Patient/family informed of [facility]'s ownership interest in [facility] and [facility], as well as of the fact that they are under no obligation to receive care at these facilities.  PASRR submitted to EDS on 03/30/15     PASRR number received on 03/30/15     Existing PASRR number confirmed on       FL2 transmitted to all facilities in geographic area requested by pt/family on 03/30/15     FL2 transmitted to all facilities within larger geographic area on       Patient informed that his/her managed care company has contracts with or will negotiate with certain facilities, including the following:        No   Patient/family informed of bed offers received.  Patient chooses bed at       Physician recommends and patient chooses bed at      Patient to be transferred to   on  .  Patient to be transferred to facility by       Patient family notified on   of transfer.  Name of family member notified:        PHYSICIAN Please sign FL2, Please prepare prescriptions     Additional Comment:    _______________________________________________ Cranford Mon, LCSW 03/30/2015, 4:21 PM

## 2015-03-30 NOTE — Progress Notes (Addendum)
Subjective:  Still in pain (leg). No CP, no SOB  Objective:  Vital Signs in the last 24 hours: Temp:  [98.2 F (36.8 C)-98.3 F (36.8 C)] 98.2 F (36.8 C) (05/11 0341) Pulse Rate:  [74-79] 74 (05/11 0341) Resp:  [18] 18 (05/11 0341) BP: (122-129)/(75-95) 122/95 mmHg (05/11 0341) SpO2:  [97 %-100 %] 100 % (05/11 0341)  Intake/Output from previous day: 05/10 0701 - 05/11 0700 In: 600 [P.O.:600] Out: 300 [Urine:300]   Physical Exam: General: Well developed, in bed in no acute distress. Head:  Normocephalic and atraumatic. Lungs: Clear to auscultation and percussion. Heart: Normal S1 and S2.  No murmur, rubs or gallops.  Abdomen: soft, non-tender, positive bowel sounds. Obese Extremities: No clubbing or cyanosis. No edema. Neurologic: Alert     Lab Results:  Recent Labs  03/29/15 0725 03/30/15 0231  WBC 5.1 5.9  HGB 12.7 12.0  PLT 177 181    Recent Labs  03/29/15 0725 03/30/15 0231  NA 139 138  K 3.9 4.0  CL 106 108  CO2 23 20*  GLUCOSE 99 109*  BUN 15 15  CREATININE 0.75 0.73   No results for input(s): TROPONINI in the last 72 hours.  Invalid input(s): CK, MB Hepatic Function Panel  Recent Labs  03/29/15 0725  PROT 6.8  ALBUMIN 3.0*  AST 19  ALT 17  ALKPHOS 86  BILITOT 1.3*   No results for input(s): CHOL in the last 72 hours. No results for input(s): PROTIME in the last 72 hours.  Imaging: Dg Ankle Complete Left  03/29/2015   CLINICAL DATA:  [place] 3 weeks ago and fell repeatedly this week.  EXAM: LEFT ANKLE COMPLETE - 3+ VIEW  COMPARISON:  None.  FINDINGS: There is a small calcification at the medial aspect of the talus which more likely is not an acute fracture. It appears to be corticated. There is no acute fracture. The mortise is symmetric. Moderate degenerative changes are present at the talonavicular articulation.  IMPRESSION: Negative for acute fracture   Electronically Signed   By: Andreas Newport M.D.   On: 03/29/2015 01:07   Ct  Knee Left Wo Contrast  03/29/2015   CLINICAL DATA:  Fall 10 days ago in was seen in ED. Continued pain and swelling since then.  EXAM: CT OF THE left KNEE WITHOUT CONTRAST  TECHNIQUE: Multidetector CT imaging of the left knee was performed according to the standard protocol. Multiplanar CT image reconstructions were also generated.  COMPARISON:  Left knee radiographs 03/29/2015 and 03/18/2015.  FINDINGS: Mild subcutaneous soft tissue edema about the left kidney. Minimal effusion. Vascular calcifications. Tricompartment degenerative changes in the left knee. Diffuse bone demineralization. Suggestion of vague linear lucency with possible cortical step-off along the lateral tibial plateau. This could represent nondisplaced tibial plateau fracture. Suggest MRI for further evaluation. Distal femur and patella appear intact. No focal bone lesion or bone destruction identified.  IMPRESSION: Possible nondisplaced lateral tibial plateau fracture. Suggest MRI for further evaluation. Tricompartment degenerative changes. Mild edema. Minimal effusion.   Electronically Signed   By: Lucienne Capers M.D.   On: 03/29/2015 02:58   Ct Ankle Left Wo Contrast  03/29/2015   CLINICAL DATA:  Patient fell about 10 days ago and was seen in the ED. Continued pain and swelling.  EXAM: CT OF THE LEFT ANKLE WITHOUT CONTRAST  TECHNIQUE: Multidetector CT imaging of the left ankle was performed according to the standard protocol. Multiplanar CT image reconstructions were also generated.  COMPARISON:  Left ankle radiographs 03/29/2015  FINDINGS: Diffuse soft tissue edema throughout the subcutaneous fat of the left ankle, most prominent anteriorly and laterally. Vascular calcifications are present in the anterior and posterior tibial arteries.  Degenerative changes in the left ankle. Degenerative changes in the visualized intertarsal joints. No evidence of acute fracture or dislocation. Ankle mortise and talar dome appear intact. No focal bone  lesion or bone destruction. Bone cortex and trabecular architecture appear intact. 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  IMPRESSION: Diffuse swelling about the left ankle. Degenerative changes. No acute fracture or dislocation.   Electronically Signed   By: Lucienne Capers M.D.   On: 03/29/2015 02:53   Dg Knee Complete 4 Views Left  03/29/2015   CLINICAL DATA:  Left ankle and knee pain since a fall 3 weeks ago. 3 more falls this week. Limited motility.  EXAM: LEFT KNEE - COMPLETE 4+ VIEW  COMPARISON:  03/18/2015  FINDINGS: Degenerative changes in the left knee with tricompartment narrowing and osteophytosis. Diffuse bone demineralization. No evidence of acute fracture or dislocation. No focal bone lesion or bone destruction. No significant effusion. Soft tissues are unremarkable.  IMPRESSION: Tricompartment degenerative changes in the left knee. No acute fractures identified.   Electronically Signed   By: Lucienne Capers M.D.   On: 03/29/2015 01:06   Personally viewed.   Telemetry: No adverse rhythm. AFIB noted Personally viewed.   Scheduled Meds: . aspirin EC  81 mg Oral Daily  . cefTRIAXone (ROCEPHIN)  IV  2 g Intravenous Q24H  . furosemide  20 mg Oral Daily  . levETIRAcetam  500 mg Oral BID  . metoprolol  50 mg Oral Daily  . pantoprazole  40 mg Oral Daily  . simvastatin  40 mg Oral QHS   Continuous Infusions: . heparin 1,650 Units/hr (03/29/15 1802)   PRN Meds:.HYDROcodone-acetaminophen, methocarbamol **OR** methocarbamol (ROBAXIN)  IV   Assessment/Plan:  Principal Problem:   Knee fracture, left Active Problems:   Altered mental status   A-fib   CVA (cerebral infarction)   GERD (gastroesophageal reflux disease)   Dementia   UTI (lower urinary tract infection)  AFIB - no new recommendation from original consult note, agree with Dr. Haroldine Laws. Our recommendation is Eliquis. CHADSVaSc = 6 she has a 10% yearly risk of stroke. No change in rate  control. No pauses requiring pacemaker.   Will sign off. Please call with questions.    [name], [facility] 03/30/2015, 9:52 AM

## 2015-03-30 NOTE — Consult Note (Signed)
[facility] Face-to-Face Psychiatry Consult   Reason for Consult:  capacity and dementia Referring Physician:  Dr. Wynelle Cleveland Patient Identification: [name] MRN:  [id number] Principal Diagnosis: Dementia Diagnosis:   Patient Active Problem List   Diagnosis Date Noted  . Knee fracture, left [S82.892A] 03/29/2015  . UTI (lower urinary tract infection) [N39.0] 03/29/2015  . Altered mental status [R41.82] 02/14/2015  . Mental status, decreased [R41.82] 02/14/2015  . A-fib [I48.91] 02/14/2015  . CVA (cerebral infarction) [I63.9] 02/14/2015  . GERD (gastroesophageal reflux disease) [K21.9] 02/14/2015  . Seizure [R56.9] 02/14/2015  . Dementia [F03.90] 02/14/2015  . GERD without esophagitis [K21.9] 02/14/2015  . History of CVA (cerebrovascular accident) [Z86.73]     Total Time spent with patient: 1 hour  Subjective:   [name] is a 74 y.o. female patient admitted with fall, confusion and drowsy.  HPI: [name] is a 74 y.o. female seen, electronic chart reviewed and case discussed with Dr. Wynelle Cleveland and patient son who was at bedside regarding psychiatric consultation and evaluation of cognitive deficits, dementia, odd behaviors and capacity evaluation. Patient reported she was admitted to [facility] because of left leg pain secondary to fall about 10 days ago while visiting her sister. Patient reportedly forgot that she went to the hospital in [place] and evaluation was negative for fractures. Reportedly patient continued to have pain which was not controlled even with the pain medication and decreased functioning. Patient son reported she has been forgetful, confused and having hallucinations about people using drugs and trying to mix with her food, not able to sleep, wandering in the mid lab tonight and calling the police into home. Patient also has multiple medical problems as listed in active problem list and patient has a limited knowledge about her current medical  condition and mental status. Patient has history of stroke which resulted in deterioration of her mental functions and considered vascular dementia. Patient repeatedly asking her son for simple questions asked her. Patient seems like increased dependency on her son and other family members. Patient reportedly uses walker at home and outside. Reportedly patient also takes blood thinners which can put her excessive bleeding if she continued to have falls. Patient denies active suicidal or homicidal ideation, depression, mania and psychosis. Patient has a family history of dementia as per her son. Patient son has been caring for her over 6 years and now believes that she need to be in their placement out-of-home. Reportedly there is no abuse or victimization. Patient has intact orientation,  somewhat decreased concentration, easily forgetful and confused. She has intact language functions intact but has some mild articulation difficulties. Patient has no history of inpatient psychiatric hospitalization  Past Medical History:  Past Medical History  Diagnosis Date  . Arthritis   . Atrial fibrillation   . CVA (cerebral infarction)   . Seizures   . A-fib 02/14/2015  . CVA (cerebral infarction) 02/14/2015  . GERD (gastroesophageal reflux disease) 02/14/2015  . GERD without esophagitis 02/14/2015  . Seizure 02/14/2015  . Complication of anesthesia     DIFFICULTY WAKING     Past Surgical History  Procedure Laterality Date  . Total hip arthroplasty    . Joint replacement    . Fracture surgery     Family History:  Family History  Problem Relation Age of Onset  . Atrial fibrillation Sister    Social History:  History  Alcohol Use No     History  Drug Use No  History   Social History  . Marital Status: Widowed    Spouse Name: N/A  . Number of Children: N/A  . Years of Education: N/A   Social History Main Topics  . Smoking status: Never Smoker   . Smokeless tobacco: Never Used  . Alcohol  Use: No  . Drug Use: No  . Sexual Activity: No   Other Topics Concern  . None   Social History Narrative   Additional Social History: Patient lives with her son and has extended family members like of 4 sisters and multiple cousins. Patient has been using walker both at home and outside. Patient son stated he takes her out with wheelchair.                          Allergies:  No Known Allergies  Labs:  Results for orders placed or performed during the hospital encounter of 03/28/15 (from the past 48 hour(s))  Comprehensive metabolic panel     Status: Abnormal   Collection Time: 03/29/15 12:32 AM  Result Value Ref Range   Sodium 141 135 - 145 mmol/L   Potassium 4.0 3.5 - 5.1 mmol/L   Chloride 107 101 - 111 mmol/L   CO2 22 22 - 32 mmol/L   Glucose, Bld 97 70 - 99 mg/dL   BUN 16 6 - 20 mg/dL   Creatinine, Ser 0.79 0.44 - 1.00 mg/dL   Calcium 9.2 8.9 - 10.3 mg/dL   Total Protein 7.4 6.5 - 8.1 g/dL   Albumin 3.4 (L) 3.5 - 5.0 g/dL   AST 18 15 - 41 U/L   ALT 18 14 - 54 U/L   Alkaline Phosphatase 97 38 - 126 U/L   Total Bilirubin 1.2 0.3 - 1.2 mg/dL   GFR calc non Af Amer >60 >60 mL/min   GFR calc Af Amer >60 >60 mL/min    Comment: (NOTE) The eGFR has been calculated using the CKD EPI equation. This calculation has not been validated in all clinical situations. eGFR's persistently <60 mL/min signify possible Chronic Kidney Disease.    Anion gap 12 5 - 15  CBC with Differential     Status: None   Collection Time: 03/29/15 12:32 AM  Result Value Ref Range   WBC 5.2 4.0 - 10.5 K/uL   RBC 4.44 3.87 - 5.11 MIL/uL   Hemoglobin 13.3 12.0 - 15.0 g/dL   HCT 41.4 36.0 - 46.0 %   MCV 93.2 78.0 - 100.0 fL   MCH 30.0 26.0 - 34.0 pg   MCHC 32.1 30.0 - 36.0 g/dL   RDW 12.2 11.5 - 15.5 %   Platelets 171 150 - 400 K/uL   Neutrophils Relative % 56 43 - 77 %   Neutro Abs 2.9 1.7 - 7.7 K/uL   Lymphocytes Relative 31 12 - 46 %   Lymphs Abs 1.6 0.7 - 4.0 K/uL   Monocytes  Relative 11 3 - 12 %   Monocytes Absolute 0.6 0.1 - 1.0 K/uL   Eosinophils Relative 3 0 - 5 %   Eosinophils Absolute 0.1 0.0 - 0.7 K/uL   Basophils Relative 0 0 - 1 %   Basophils Absolute 0.0 0.0 - 0.1 K/uL  Urinalysis, Routine w reflex microscopic     Status: Abnormal   Collection Time: 03/29/15  1:41 AM  Result Value Ref Range   Color, Urine AMBER (A) YELLOW    Comment: BIOCHEMICALS MAY BE AFFECTED BY COLOR   APPearance  CLOUDY (A) CLEAR   Specific Gravity, Urine 1.022 1.005 - 1.030   pH 5.5 5.0 - 8.0   Glucose, UA NEGATIVE NEGATIVE mg/dL   Hgb urine dipstick NEGATIVE NEGATIVE   Bilirubin Urine NEGATIVE NEGATIVE   Ketones, ur NEGATIVE NEGATIVE mg/dL   Protein, ur NEGATIVE NEGATIVE mg/dL   Urobilinogen, UA 2.0 (H) 0.0 - 1.0 mg/dL   Nitrite POSITIVE (A) NEGATIVE   Leukocytes, UA SMALL (A) NEGATIVE  Urine microscopic-add on     Status: Abnormal   Collection Time: 03/29/15  1:41 AM  Result Value Ref Range   Squamous Epithelial / LPF FEW (A) RARE   WBC, UA 7-10 <3 WBC/hpf   RBC / HPF 0-2 <3 RBC/hpf   Bacteria, UA MANY (A) RARE   Crystals CA OXALATE CRYSTALS (A) NEGATIVE  CBC     Status: None   Collection Time: 03/29/15  7:25 AM  Result Value Ref Range   WBC 5.1 4.0 - 10.5 K/uL   RBC 4.25 3.87 - 5.11 MIL/uL   Hemoglobin 12.7 12.0 - 15.0 g/dL   HCT 39.5 36.0 - 46.0 %   MCV 92.9 78.0 - 100.0 fL   MCH 29.9 26.0 - 34.0 pg   MCHC 32.2 30.0 - 36.0 g/dL   RDW 12.3 11.5 - 15.5 %   Platelets 177 150 - 400 K/uL  Protime-INR     Status: Abnormal   Collection Time: 03/29/15  7:25 AM  Result Value Ref Range   Prothrombin Time 19.7 (H) 11.6 - 15.2 seconds   INR 1.65 (H) 0.00 - 1.49  APTT     Status: Abnormal   Collection Time: 03/29/15  7:25 AM  Result Value Ref Range   aPTT 49 (H) 24 - 37 seconds    Comment:        IF BASELINE aPTT IS ELEVATED, SUGGEST PATIENT RISK ASSESSMENT BE USED TO DETERMINE APPROPRIATE ANTICOAGULANT THERAPY.   Comprehensive metabolic panel     Status:  Abnormal   Collection Time: 03/29/15  7:25 AM  Result Value Ref Range   Sodium 139 135 - 145 mmol/L   Potassium 3.9 3.5 - 5.1 mmol/L   Chloride 106 101 - 111 mmol/L   CO2 23 22 - 32 mmol/L   Glucose, Bld 99 70 - 99 mg/dL   BUN 15 6 - 20 mg/dL   Creatinine, Ser 0.75 0.44 - 1.00 mg/dL   Calcium 8.9 8.9 - 10.3 mg/dL   Total Protein 6.8 6.5 - 8.1 g/dL   Albumin 3.0 (L) 3.5 - 5.0 g/dL   AST 19 15 - 41 U/L   ALT 17 14 - 54 U/L   Alkaline Phosphatase 86 38 - 126 U/L   Total Bilirubin 1.3 (H) 0.3 - 1.2 mg/dL   GFR calc non Af Amer >60 >60 mL/min   GFR calc Af Amer >60 >60 mL/min    Comment: (NOTE) The eGFR has been calculated using the CKD EPI equation. This calculation has not been validated in all clinical situations. eGFR's persistently <60 mL/min signify possible Chronic Kidney Disease.    Anion gap 10 5 - 15  Heparin level (unfractionated)     Status: Abnormal   Collection Time: 03/29/15  2:38 PM  Result Value Ref Range   Heparin Unfractionated 0.23 (L) 0.30 - 0.70 IU/mL    Comment:        IF HEPARIN RESULTS ARE BELOW EXPECTED VALUES, AND PATIENT DOSAGE HAS BEEN CONFIRMED, SUGGEST FOLLOW UP TESTING OF ANTITHROMBIN III LEVELS.  Heparin level (unfractionated)     Status: None   Collection Time: 03/29/15 11:45 PM  Result Value Ref Range   Heparin Unfractionated 0.43 0.30 - 0.70 IU/mL    Comment:        IF HEPARIN RESULTS ARE BELOW EXPECTED VALUES, AND PATIENT DOSAGE HAS BEEN CONFIRMED, SUGGEST FOLLOW UP TESTING OF ANTITHROMBIN III LEVELS.   CBC     Status: None   Collection Time: 03/30/15  2:31 AM  Result Value Ref Range   WBC 5.9 4.0 - 10.5 K/uL   RBC 3.98 3.87 - 5.11 MIL/uL   Hemoglobin 12.0 12.0 - 15.0 g/dL   HCT 36.6 36.0 - 46.0 %   MCV 92.0 78.0 - 100.0 fL   MCH 30.2 26.0 - 34.0 pg   MCHC 32.8 30.0 - 36.0 g/dL   RDW 12.0 11.5 - 15.5 %   Platelets 181 150 - 400 K/uL  Basic metabolic panel     Status: Abnormal   Collection Time: 03/30/15  2:31 AM  Result  Value Ref Range   Sodium 138 135 - 145 mmol/L   Potassium 4.0 3.5 - 5.1 mmol/L   Chloride 108 101 - 111 mmol/L   CO2 20 (L) 22 - 32 mmol/L   Glucose, Bld 109 (H) 70 - 99 mg/dL   BUN 15 6 - 20 mg/dL   Creatinine, Ser 0.73 0.44 - 1.00 mg/dL   Calcium 8.6 (L) 8.9 - 10.3 mg/dL   GFR calc non Af Amer >60 >60 mL/min   GFR calc Af Amer >60 >60 mL/min    Comment: (NOTE) The eGFR has been calculated using the CKD EPI equation. This calculation has not been validated in all clinical situations. eGFR's persistently <60 mL/min signify possible Chronic Kidney Disease.    Anion gap 10 5 - 15    Vitals: Blood pressure 122/95, pulse 74, temperature 98.2 F (36.8 C), temperature source Oral, resp. rate 18, height _0  (1.626 m), weight 174.4 kg (384 lb 7.7 oz), SpO2 100 %.  Risk to Self: Is patient at risk for suicide?: No Risk to Others:   Prior Inpatient Therapy:   Prior Outpatient Therapy:    Current Facility-Administered Medications  Medication Dose Route Frequency Provider Last Rate Last Dose  . aspirin EC tablet 81 mg  81 mg Oral Daily Lavina Hamman, MD   81 mg at 03/29/15 1021  . cefTRIAXone (ROCEPHIN) 2 g in dextrose 5 % 50 mL IVPB  2 g Intravenous Q24H Wendee Beavers, RPH   2 g at 03/30/15 0500  . furosemide (LASIX) tablet 20 mg  20 mg Oral Daily Lavina Hamman, MD   20 mg at 03/29/15 1021  . heparin ADULT infusion 100 units/mL (25000 units/250 mL)  1,650 Units/hr Intravenous Continuous Skeet Simmer, RPH 16.5 mL/hr at 03/29/15 1802 1,650 Units/hr at 03/29/15 1802  . HYDROcodone-acetaminophen (NORCO/VICODIN) 5-325 MG per tablet 1-2 tablet  1-2 tablet Oral Q4H PRN Debbe Odea, MD      . levETIRAcetam (KEPPRA) tablet 500 mg  500 mg Oral BID Lavina Hamman, MD   500 mg at 03/29/15 2128  . methocarbamol (ROBAXIN) tablet 500 mg  500 mg Oral Q6H PRN Lavina Hamman, MD   500 mg at 03/29/15 1739   Or  . methocarbamol (ROBAXIN) 500 mg in dextrose 5 % 50 mL IVPB  500 mg Intravenous Q6H PRN  Lavina Hamman, MD      . metoprolol tartrate (LOPRESSOR) tablet 50 mg  50 mg Oral Daily Lavina Hamman, MD   50 mg at 03/29/15 1032  . pantoprazole (PROTONIX) EC tablet 40 mg  40 mg Oral Daily Lavina Hamman, MD   40 mg at 03/29/15 1032  . simvastatin (ZOCOR) tablet 40 mg  40 mg Oral QHS Lavina Hamman, MD   40 mg at 03/29/15 2128    Musculoskeletal: Strength & Muscle Tone: decreased Gait & Station: unable to stand Patient leans: N/A  Psychiatric Specialty Exam: Physical Exam as per history and physical   ROS forgetfulness, confusion, ongoing pain on her left leg since an injury and  as mentioned in the history of present illness. A comprehensive review of the other systems is negative.   Blood pressure 122/95, pulse 74, temperature 98.2 F (36.8 C), temperature source Oral, resp. rate 18, height _0  (1.626 m), weight 174.4 kg (384 lb 7.7 oz), SpO2 100 %.Body mass index is 65.96 kg/(m^2).  General Appearance: Guarded  Eye Contact::  Good  Speech:  Blocked and Slurred  Volume:  Decreased  Mood:  Anxious and Depressed  Affect:  Constricted and Depressed  Thought Process:  Coherent  Orientation:  Full (Time, Place, and Person)  Thought Content:  Paranoid Ideation and Rumination  Suicidal Thoughts:  No  Homicidal Thoughts:  No  Memory:  Immediate;   Fair Recent;   Poor  Judgement:  Impaired  Insight:  Shallow  Psychomotor Activity:  Decreased  Concentration:  Fair  Recall:  [employer]  Language: Fair  Akathisia:  No  Handed:  Right  AIMS (if indicated):     Assets:  Communication Skills Desire for Improvement Financial Resources/Insurance Housing Intimacy Leisure Time Resilience Social Support  ADL's:  Impaired  Cognition: Impaired,  Mild  Sleep:      Medical Decision Making: New problem, with additional work up planned, Review of Psycho-Social Stressors (1), Review or order clinical lab tests (1), Established Problem, Worsening (2), Review or order  medicine tests (1), Review of Medication Regimen & Side Effects (2) and Review of New Medication or Change in Dosage (2)  Treatment Plan Summary: Daily contact with patient to assess and evaluate symptoms and progress in treatment and Medication management  Plan: Patient has been suffering with multiple medical problems, decreased psychomotor activity, cognitive deficits and unable to care for herself.  Patient does not meet criteria for capacity to make her own medical decisions as she has been confused and have significant cognitive deficits and being paranoid.  Dementia: Consider Aricept 5 mg daily and Seroquel 25 mg at bedtime for insomnia  Patient does not meet criteria for psychiatric inpatient admission. Supportive therapy provided about ongoing stressors.   Appreciate psychiatric consultation and we sign off at this time Please contact [phone] or [phone] if needs further assistance   Disposition: Patient benefit from out of home placement in an assisted living facility or skilled nursing facility as she needed 24 hours medical and personal care. Patient cannot care for herself.  Janel Beane,JANARDHAHA R. 03/30/2015 10:16 AM

## 2015-03-30 NOTE — Progress Notes (Signed)
ANTICOAGULATION CONSULT NOTE - Follow Up Consult  Pharmacy Consult for Heparin  Indication: atrial fibrillation  No Known Allergies  Patient Measurements: Height: 5\' 4"  (162.6 cm) Weight: (!) 384 lb 7.7 oz (174.4 kg) IBW/kg (Calculated) : 54.7 Vital Signs: Temp: 98.3 F (36.8 C) (05/10 2304) Temp Source: Oral (05/10 2304) BP: 129/75 mmHg (05/10 2304) Pulse Rate: 79 (05/10 2304)  Labs:  Recent Labs  03/29/15 0032 03/29/15 0725 03/29/15 1438 03/29/15 2345  HGB 13.3 12.7  --   --   HCT 41.4 39.5  --   --   PLT 171 177  --   --   APTT  --  49*  --   --   LABPROT  --  19.7*  --   --   INR  --  1.65*  --   --   HEPARINUNFRC  --   --  0.23* 0.43  CREATININE 0.79 0.75  --   --     Estimated Creatinine Clearance: 98.4 mL/min (by C-G formula based on Cr of 0.75).    Assessment: Therapeutic heparin level after rate increase Goal of Therapy:  Heparin level 0.3-0.5 units/mL Monitor platelets by anticoagulation protocol: Yes   Plan:  -Continue heparin at 1650 units/hr -HL with AM labs -Daily CBC/HL- -Monitor for bleeding  Narda Bonds 03/30/2015,12:35 AM
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
## 2015-03-30 NOTE — Evaluation (Signed)
Physical Therapy Evaluation Patient Details Name: [name] MRN: [id number] DOB: 09-27-1941 Today's Date: 03/30/2015   History of Present Illness  Pt is a 74 y/o female admitted s/p fall at home. Pt fell in her driveway 10 days PTA. She was evaluated at [facility] where imaging did not show any acute findings. Since the fall pt had progressive worsening of pain in LLE along with swelling and bruising. Since that time has been mostly bed bound. Pt presented to [facility] and was found to have a non-displaced lateral plateau fracture which is being managed non-surgically at this time.   Clinical Impression  Pt admitted with above diagnosis. Pt currently with functional limitations due to the deficits listed below (see PT Problem List). At the time of PT eval pt was able to perform transfers with +2 assist. Pt was not able to maintain NWB status on the LLE during transfers, and recommended lift use for back to bed with nursing staff. Pt will benefit from skilled PT to increase their independence and safety with mobility to allow discharge to the venue listed below. Strongly feel that pt will require STR at the SNF level prior to return home. It appears that pt was functional prior to this fall, however since then has been significantly limited with mobility. Increased independence with mobility and ADL's will be required to lessen burden of care on son at d/c.      Follow Up Recommendations SNF;Supervision/Assistance - 24 hour    Equipment Recommendations  None recommended by PT    Recommendations for Other Services       Precautions / Restrictions Precautions Precautions: Fall Restrictions Weight Bearing Restrictions: Yes LLE Weight Bearing: Non weight bearing      Mobility  Bed Mobility Overal bed mobility: Needs Assistance;+2 for physical assistance Bed Mobility: Supine to Sit     Supine to sit: Max assist;+2 for physical assistance     General bed mobility comments: Attempted rolling  and sidelying to sit, however was not able to sequence even with VC's. Max assist was required to achieve sitting EOB.   Transfers Overall transfer level: Needs assistance   Transfers: Sit to/from Stand;Stand Pivot Transfers Sit to Stand: Max assist;+2 physical assistance Stand pivot transfers: Mod assist;+2 physical assistance       General transfer comment: Assist to power-up to full standing. Pt had difficulty taking steps around to the recliner and was unable to maintain NWB status. It did not appear that pt made attempts at corrective changes with cueing.   Ambulation/Gait             General Gait Details: Did not attempt further gait training as pt was not able to maintain NWB on the LLE.   Stairs            Wheelchair Mobility    Modified Rankin (Stroke Patients Only)       Balance Overall balance assessment: Needs assistance Sitting-balance support: Feet supported;No upper extremity supported Sitting balance-Leahy Scale: Poor     Standing balance support: Bilateral upper extremity supported Standing balance-Leahy Scale: Poor                               Pertinent Vitals/Pain Pain Assessment: Faces Faces Pain Scale: Hurts a little bit Pain Location: LLE Pain Descriptors / Indicators: Grimacing Pain Intervention(s): Limited activity within patient's tolerance;Monitored during session;Repositioned    Home Living Family/patient expects to be discharged to:: Private residence  Living Arrangements: Children Available Help at Discharge: Family;Other (Comment) (Per pt son or someone is always around) Type of Home: House Home Access: Ramped entrance     Home Layout: One level Home Equipment: [place] - 2 wheels;Cane - single point;Wheelchair - manual      Prior Function Level of Independence: Needs assistance   Gait / Transfers Assistance Needed: pt reports able to walk to BR and around condo herself with RW prior to fall. After fall has  not been walking hardly at all per pt.   ADL's / Homemaking Assistance Needed: Son has been assisting her with ADL's more lately.         Hand Dominance        Extremity/Trunk Assessment   Upper Extremity Assessment: Generalized weakness           Lower Extremity Assessment: Generalized weakness;LLE deficits/detail   LLE Deficits / Details: Decreased strength and AROM consistent with diagnosis  Cervical / Trunk Assessment: Normal  Communication      Cognition Arousal/Alertness: Awake/alert Behavior During Therapy: Flat affect Overall Cognitive Status: History of cognitive impairments - at baseline                      General Comments      Exercises        Assessment/Plan    PT Assessment Patient needs continued PT services  PT Diagnosis Difficulty walking;Generalized weakness;Acute pain   PT Problem List Decreased strength;Decreased range of motion;Decreased activity tolerance;Decreased balance;Decreased mobility;Decreased knowledge of use of DME;Decreased safety awareness;Decreased knowledge of precautions;Pain  PT Treatment Interventions DME instruction;Gait training;Therapeutic activities;Functional mobility training;Therapeutic exercise;Neuromuscular re-education;Patient/family education;Wheelchair mobility training   PT Goals (Current goals can be found in the Care Plan section) Acute Rehab PT Goals Patient Stated Goal: To not go to rehab PT Goal Formulation: With patient Time For Goal Achievement: 04/06/15 Potential to Achieve Goals: Fair    Frequency Min 3X/week   Barriers to discharge        Co-evaluation               End of Session Equipment Utilized During Treatment: Gait belt Activity Tolerance: Other (comment) (Treatment limited 2 to pt unable to maintain NWB) Patient left: in chair;with call bell/phone within reach Nurse Communication: Mobility status;Need for lift equipment         Time: 1331-1400 PT Time  Calculation (min) (ACUTE ONLY): 29 min   Charges:   PT Evaluation $Initial PT Evaluation Tier I: 1 Procedure PT Treatments $Therapeutic Activity: 8-22 mins   PT G Codes:        Rolinda Roan 14-Apr-2015, 2:49 PM   Rolinda Roan, PT, DPT Acute Rehabilitation Services Pager: [phone]
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
## 2015-03-30 NOTE — Progress Notes (Signed)
Called [employer] at [phone]. Talked to [place]. ELIQUIS is covered. No Prior Authorization required. Patient will have a retail pharmacy co-payment of $7.40-- CM will give pt 30 day free card prior to discharge

## 2015-03-31 ENCOUNTER — Inpatient Hospital Stay (HOSPITAL_COMMUNITY): Payer: Medicare Other

## 2015-03-31 DIAGNOSIS — M7989 Other specified soft tissue disorders: Secondary | ICD-10-CM

## 2015-03-31 DIAGNOSIS — W19XXXA Unspecified fall, initial encounter: Secondary | ICD-10-CM

## 2015-03-31 LAB — CBC
HEMATOCRIT: 37.4 % (ref 36.0–46.0)
HEMOGLOBIN: 12.3 g/dL (ref 12.0–15.0)
MCH: 29.9 pg (ref 26.0–34.0)
MCHC: 32.9 g/dL (ref 30.0–36.0)
MCV: 91 fL (ref 78.0–100.0)
Platelets: 188 10*3/uL (ref 150–400)
RBC: 4.11 MIL/uL (ref 3.87–5.11)
RDW: 12.1 % (ref 11.5–15.5)
WBC: 4.9 10*3/uL (ref 4.0–10.5)

## 2015-03-31 MED ORDER — AMOXICILLIN 500 MG PO CAPS
500.0000 mg | ORAL_CAPSULE | Freq: Three times a day (TID) | ORAL | Status: DC
  Administered 2015-03-31 – 2015-04-01 (×4): 500 mg via ORAL
  Filled 2015-03-31 (×6): qty 1

## 2015-03-31 NOTE — Discharge Instructions (Signed)

## 2015-03-31 NOTE — Progress Notes (Addendum)
1:30pm Insurance auth received.  Bed is available when patient is medically stable  10:30am CSW spoke with [facility]- bed offer given and bed is available today.  CSW submitted clinicals to [place] for insurance authJosem Kaufmann pending.  CSW will continue to follow.  Domenica Reamer, [place] Social Worker [phone]

## 2015-03-31 NOTE — Progress Notes (Signed)
VASCULAR LAB PRELIMINARY  PRELIMINARY  PRELIMINARY  PRELIMINARY  Left lower extremity venous Doppler completed.    Preliminary report:  There is no obvious evidence of DVT or SVT noted in the visualized veins of the left lower extremity.   Leily Capek, RVT 03/31/2015, 8:42 AM

## 2015-03-31 NOTE — Clinical Social Work Psych Note (Addendum)
Psychiatry was consulted for determination of capacity.  Psychiatry evaluated on 03/30/2015 and determined patient to NOT have capacity to make her own decisions regarding placement.  Psychiatry also made medication adjustments.  (please see psychiatrist documentation: Ambrose Finland 03/30/2015 10:16am)   Disposition: Per unit CSW, patient will be receiving STR at [facility]. Insurance pending authorization.  Psychiatry signing off.  Nonnie Done, LCSW [phone]  Psychiatric & Orthopedics (5N 1-8) Clinical Social Worker
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
## 2015-04-01 DIAGNOSIS — S82143A Displaced bicondylar fracture of unspecified tibia, initial encounter for closed fracture: Secondary | ICD-10-CM | POA: Insufficient documentation

## 2015-04-01 LAB — COMPREHENSIVE METABOLIC PANEL
ALBUMIN: 2.7 g/dL — AB (ref 3.5–5.0)
ALT: 16 U/L (ref 14–54)
ANION GAP: 8 (ref 5–15)
AST: 21 U/L (ref 15–41)
Alkaline Phosphatase: 88 U/L (ref 38–126)
BILIRUBIN TOTAL: 1 mg/dL (ref 0.3–1.2)
BUN: 11 mg/dL (ref 6–20)
CO2: 24 mmol/L (ref 22–32)
Calcium: 8.7 mg/dL — ABNORMAL LOW (ref 8.9–10.3)
Chloride: 104 mmol/L (ref 101–111)
Creatinine, Ser: 0.72 mg/dL (ref 0.44–1.00)
GFR calc Af Amer: >60 mL/min (ref >60)
Glucose, Bld: 102 mg/dL — ABNORMAL HIGH (ref 65–99)
POTASSIUM: 3.9 mmol/L (ref 3.5–5.1)
Sodium: 136 mmol/L (ref 135–145)
TOTAL PROTEIN: 6.2 g/dL — AB (ref 6.5–8.1)

## 2015-04-01 MED ORDER — HYDROCODONE-ACETAMINOPHEN 5-325 MG PO TABS: 1.0000 | ORAL_TABLET | ORAL | Status: DC | PRN

## 2015-04-01 MED ORDER — APIXABAN 5 MG PO TABS: 5.0000 mg | ORAL_TABLET | Freq: Two times a day (BID) | ORAL | Status: AC

## 2015-04-01 MED ORDER — LORAZEPAM 1 MG PO TABS: 1.0000 mg | ORAL_TABLET | Freq: Three times a day (TID) | ORAL | Status: DC | PRN

## 2015-04-01 MED ORDER — AMOXICILLIN 500 MG PO CAPS: 500.0000 mg | ORAL_CAPSULE | Freq: Three times a day (TID) | ORAL | Status: AC

## 2015-04-01 MED ORDER — DONEPEZIL HCL 5 MG PO TABS: 5.0000 mg | ORAL_TABLET | Freq: Every day | ORAL | Status: AC

## 2015-04-01 MED ORDER — QUETIAPINE FUMARATE 50 MG PO TABS: 50.0000 mg | ORAL_TABLET | Freq: Every day | ORAL | Status: AC

## 2015-04-01 MED ORDER — POLYETHYLENE GLYCOL 3350 17 G PO PACK: 17.0000 g | PACK | Freq: Every day | ORAL | Status: AC

## 2015-04-01 NOTE — Progress Notes (Signed)
Per MD order, tap water enema completed with successful elimination of large, hard, compacted stool.  Patient after elimination stated she felt better and had no more concerns with her lower abdomen hurting from being constipated.  Will monitor.
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
## 2015-04-01 NOTE — Consult Note (Signed)
Psychiatry Consult follow-up  Reason for Consult:  capacity and dementia Referring Physician:  Dr. Wynelle Cleveland Patient Identification: [name] MRN:  [id number] Principal Diagnosis: Knee fracture, left Diagnosis:   Patient Active Problem List   Diagnosis Date Noted  . Fall [W19.XXXA]   . Knee fracture, left [S82.892A] 03/29/2015  . UTI (lower urinary tract infection) [N39.0] 03/29/2015  . Mental status, decreased [R41.82] 02/14/2015  . A-fib [I48.91] 02/14/2015  . CVA (cerebral infarction) [I63.9] 02/14/2015  . GERD (gastroesophageal reflux disease) [K21.9] 02/14/2015  . Seizure [R56.9] 02/14/2015  . Dementia [F03.90] 02/14/2015  . GERD without esophagitis [K21.9] 02/14/2015  . History of CVA (cerebrovascular accident) [Z86.73]     Total Time spent with patient: 20 minutes  Subjective:   [name] is a 74 y.o. female patient admitted with fall, confusion and drowsy.  HPI: [name] is a 74 y.o. female seen, electronic chart reviewed and case discussed with Dr. Wynelle Cleveland and patient son who was at bedside regarding psychiatric consultation and evaluation of cognitive deficits, dementia, odd behaviors and capacity evaluation. Patient reported she was admitted to [facility] because of left leg pain secondary to fall about 10 days ago while visiting her sister. Patient reportedly forgot that she went to the hospital in [place] and evaluation was negative for fractures. Reportedly patient continued to have pain which was not controlled even with the pain medication and decreased functioning. Patient son reported she has been forgetful, confused and having hallucinations about people using drugs and trying to mix with her food, not able to sleep, wandering in the mid lab tonight and calling the police into home. Patient also has multiple medical problems as listed in active problem list and patient has a limited knowledge about her current medical condition and mental  status. Patient has history of stroke which resulted in deterioration of her mental functions and considered vascular dementia. Patient repeatedly asking her son for simple questions asked her. Patient seems like increased dependency on her son and other family members. Patient reportedly uses walker at home and outside. Reportedly patient also takes blood thinners which can put her excessive bleeding if she continued to have falls. Patient denies active suicidal or homicidal ideation, depression, mania and psychosis. Patient has a family history of dementia as per her son. Patient son has been caring for her over 6 years and now believes that she need to be in their placement out-of-home. Reportedly there is no abuse or victimization. Patient has intact orientation,  somewhat decreased concentration, easily forgetful and confused. She has intact language functions intact but has some mild articulation difficulties. Patient has no history of inpatient psychiatric hospitalization.  Interval history: Patient has compliant paranoid ideations that her son has been mistreating her and possibly using drugs like heroin because she heard on the phone. Patient repeatedly stated that her son the help he needed to go for that he have treatment for substance abuse.  Patient reported she has a little pain in her knees and hips today and happy she is receiving appropriate pain medication. Patient states that she has a good memory but repeatedly asked her son to answer the simple questions. Patient was taken medication as prescribed and has no reported side effects. Patient has no suicidal or homicidal ideation, intention or plans.   Patient will be placed out of home as patient cannot care for herself and her son cannot help her any longer. Psychiatric social service will follow up with the patient  regarding appropriate  psychiatric follow-ups during her placement.  Past Medical History:  Past Medical History  Diagnosis  Date  . Arthritis   . Atrial fibrillation   . CVA (cerebral infarction)   . Seizures   . A-fib 02/14/2015  . CVA (cerebral infarction) 02/14/2015  . GERD (gastroesophageal reflux disease) 02/14/2015  . GERD without esophagitis 02/14/2015  . Seizure 02/14/2015  . Complication of anesthesia     DIFFICULTY WAKING     Past Surgical History  Procedure Laterality Date  . Total hip arthroplasty    . Joint replacement    . Fracture surgery     Family History:  Family History  Problem Relation Age of Onset  . Atrial fibrillation Sister    Social History:  History  Alcohol Use No     History  Drug Use No    History   Social History  . Marital Status: Widowed    Spouse Name: N/A  . Number of Children: N/A  . Years of Education: N/A   Social History Main Topics  . Smoking status: Never Smoker   . Smokeless tobacco: Never Used  . Alcohol Use: No  . Drug Use: No  . Sexual Activity: No   Other Topics Concern  . None   Social History Narrative   Additional Social History: Patient lives with her son and has extended family members like of 4 sisters and multiple cousins. Patient has been using walker both at home and outside. Patient son stated he takes her out with wheelchair.                          Allergies:  No Known Allergies  Labs:  Results for orders placed or performed during the hospital encounter of 03/28/15 (from the past 48 hour(s))  Urine rapid drug screen (hosp performed)     Status: Abnormal   Collection Time: 03/30/15  4:59 PM  Result Value Ref Range   Opiates POSITIVE (A) NONE DETECTED   Cocaine NONE DETECTED NONE DETECTED   Benzodiazepines NONE DETECTED NONE DETECTED   Amphetamines NONE DETECTED NONE DETECTED   Tetrahydrocannabinol NONE DETECTED NONE DETECTED   Barbiturates NONE DETECTED NONE DETECTED    Comment:        DRUG SCREEN FOR MEDICAL PURPOSES ONLY.  IF CONFIRMATION IS NEEDED FOR ANY PURPOSE, NOTIFY LAB WITHIN 5 DAYS.         LOWEST DETECTABLE LIMITS FOR URINE DRUG SCREEN Drug Class       Cutoff (ng/mL) Amphetamine      1000 Barbiturate      200 Benzodiazepine   681 Tricyclics       275 Opiates          300 Cocaine          300 THC              50   CBC     Status: None   Collection Time: 03/31/15  4:35 AM  Result Value Ref Range   WBC 4.9 4.0 - 10.5 K/uL   RBC 4.11 3.87 - 5.11 MIL/uL   Hemoglobin 12.3 12.0 - 15.0 g/dL   HCT 37.4 36.0 - 46.0 %   MCV 91.0 78.0 - 100.0 fL   MCH 29.9 26.0 - 34.0 pg   MCHC 32.9 30.0 - 36.0 g/dL   RDW 12.1 11.5 - 15.5 %   Platelets 188 150 - 400 K/uL  Comprehensive metabolic panel  Status: Abnormal   Collection Time: 04/01/15  3:30 AM  Result Value Ref Range   Sodium 136 135 - 145 mmol/L   Potassium 3.9 3.5 - 5.1 mmol/L   Chloride 104 101 - 111 mmol/L   CO2 24 22 - 32 mmol/L   Glucose, Bld 102 (H) 65 - 99 mg/dL   BUN 11 6 - 20 mg/dL   Creatinine, Ser 0.72 0.44 - 1.00 mg/dL   Calcium 8.7 (L) 8.9 - 10.3 mg/dL   Total Protein 6.2 (L) 6.5 - 8.1 g/dL   Albumin 2.7 (L) 3.5 - 5.0 g/dL   AST 21 15 - 41 U/L   ALT 16 14 - 54 U/L   Alkaline Phosphatase 88 38 - 126 U/L   Total Bilirubin 1.0 0.3 - 1.2 mg/dL   GFR calc non Af Amer >60 >60 mL/min   GFR calc Af Amer >60 >60 mL/min    Comment: (NOTE) The eGFR has been calculated using the CKD EPI equation. This calculation has not been validated in all clinical situations. eGFR's persistently <60 mL/min signify possible Chronic Kidney Disease.    Anion gap 8 5 - 15    Vitals: Blood pressure 124/78, pulse 78, temperature 97.6 F (36.4 C), temperature source Oral, resp. rate 19, height 5' 4"  (1.626 m), weight 174.4 kg (384 lb 7.7 oz), SpO2 100 %.  Risk to Self: Is patient at risk for suicide?: No Risk to Others:   Prior Inpatient Therapy:   Prior Outpatient Therapy:    Current Facility-Administered Medications  Medication Dose Route Frequency Provider Last Rate Last Dose  . amoxicillin (AMOXIL) capsule 500 mg   500 mg Oral 3 times per day Reyne Dumas, MD   500 mg at 04/01/15 1027  . apixaban (ELIQUIS) tablet 5 mg  5 mg Oral BID Kris Mouton, RPH   5 mg at 04/01/15 1028  . donepezil (ARICEPT) tablet 5 mg  5 mg Oral QHS Ambrose Finland, MD   5 mg at 03/31/15 2048  . furosemide (LASIX) tablet 20 mg  20 mg Oral Daily Lavina Hamman, MD   20 mg at 04/01/15 1027  . HYDROcodone-acetaminophen (NORCO/VICODIN) 5-325 MG per tablet 1-2 tablet  1-2 tablet Oral Q4H PRN Debbe Odea, MD   2 tablet at 04/01/15 0858  . levETIRAcetam (KEPPRA) tablet 500 mg  500 mg Oral BID Lavina Hamman, MD   500 mg at 04/01/15 1027  . methocarbamol (ROBAXIN) tablet 500 mg  500 mg Oral Q6H PRN Lavina Hamman, MD   500 mg at 03/31/15 0949   Or  . methocarbamol (ROBAXIN) 500 mg in dextrose 5 % 50 mL IVPB  500 mg Intravenous Q6H PRN Lavina Hamman, MD      . metoprolol tartrate (LOPRESSOR) tablet 50 mg  50 mg Oral Daily Lavina Hamman, MD   50 mg at 04/01/15 1027  . pantoprazole (PROTONIX) EC tablet 40 mg  40 mg Oral Daily Lavina Hamman, MD   40 mg at 04/01/15 1027  . QUEtiapine (SEROQUEL) tablet 25 mg  25 mg Oral QHS Ambrose Finland, MD   25 mg at 03/31/15 2048  . simvastatin (ZOCOR) tablet 40 mg  40 mg Oral QHS Lavina Hamman, MD   40 mg at 03/31/15 2048    Musculoskeletal: Strength & Muscle Tone: decreased Gait & Station: unable to stand Patient leans: N/A  Psychiatric Specialty Exam: Physical Exam   ROS   Blood pressure 124/78, pulse 78, temperature 97.6 F (  36.4 C), temperature source Oral, resp. rate 19, height 5' 4"  (1.626 m), weight 174.4 kg (384 lb 7.7 oz), SpO2 100 %.Body mass index is 65.96 kg/(m^2).  General Appearance: Guarded  Eye Contact::  Good  Speech:  Blocked and Slurred  Volume:  Decreased  Mood:  Anxious and Depressed  Affect:  Constricted and Depressed  Thought Process:  Coherent  Orientation:  Full (Time, Place, and Person)  Thought Content:  Paranoid Ideation and Rumination   Suicidal Thoughts:  No  Homicidal Thoughts:  No  Memory:  Immediate;   Fair Recent;   Poor  Judgement:  Impaired  Insight:  Shallow  Psychomotor Activity:  Decreased  Concentration:  Fair  Recall:  [employer] [place]  Language: Fair  Akathisia:  No  Handed:  Right  AIMS (if indicated):     Assets:  Communication Skills Desire for Improvement Financial Resources/Insurance Housing Intimacy Leisure Time Resilience Social Support  ADL's:  Impaired  Cognition: Impaired,  Mild  Sleep:      Medical Decision Making: New problem, with additional work up planned, Review of Psycho-Social Stressors (1), Review or order clinical lab tests (1), Established Problem, Worsening (2), Review or order medicine tests (1), Review of Medication Regimen & Side Effects (2) and Review of New Medication or Change in Dosage (2)  Treatment Plan Summary: Daily contact with patient to assess and evaluate symptoms and progress in treatment and Medication management  Plan: Patient has been suffering with multiple medical problems, decreased psychomotor activity, cognitive deficits including paranoid delusions  and unable to care for herself.  Patient does not meet criteria for capacity to make her own medical decisions as she has been confused and have significant cognitive deficits and being paranoid.  Dementia: Continue Aricept 5 mg daily   Insomnia: Seroquel 25 mg at bedtime for insomnia  Patient does not meet criteria for psychiatric inpatient admission. Supportive therapy provided about ongoing stressors.   Appreciate psychiatric consultation and we sign off at this time Please contact [phone] or [phone] if needs further assistance   Disposition: Patient benefit from out of home placement in an assisted living facility or skilled nursing facility as she needed 24 hours medical and personal care. Patient cannot care for herself.  Jhordan Kinter,JANARDHAHA R. 04/01/2015 10:44 AM

## 2015-04-01 NOTE — Care Management Note (Signed)
Case Management Note  Patient Details  Name: [name] MRN: [id number] Date of Birth: 1941-05-30  Subjective/Objective:         Pt admitted with left knee fracture           Action/Plan: PTA pt lived at home with son  Expected Discharge Date:                  Expected Discharge Plan:  Skilled Nursing Facility  In-House Referral:  Clinical Social Work  Discharge planning Services  CM Consult, Medication Assistance  Post Acute Care Choice:    Choice offered to:     DME Arranged:    DME Agency:     HH Arranged:    [place] Agency:     Status of Service:  Completed, signed off  Medicare Important Message Given:  Yes Date Medicare IM Given:  03/31/15 Medicare IM give by:  claxton, samantha Date Additional Medicare IM Given:    Additional Medicare Important Message give by:     If discussed at [place] of Stay Meetings, dates discussed:    Additional Comments: Pt started on Eliquis- benefits check completed- Called [employer] at [phone]. Talked to [place]. ELIQUIS is covered. No Prior Authorization required. Patient will have a retail pharmacy co-payment of $7.40. - pt going to SNF- Eliquis will be covered when pt discharged from SNF   Dahlia Client, Romeo Rabon, RN 04/01/2015, 12:53 PM

## 2015-04-01 NOTE — Progress Notes (Signed)
Patient will discharge to [place] Anticipated discharge date:04/01/15 Family notified: pt son, [name] by [employer]- called at 12:10pm  CSW signing off.  Domenica Reamer, [place] Social Worker [phone]

## 2015-04-01 NOTE — Progress Notes (Signed)
Transport services at bedside for discharge of patient.  IV line removed with no concerns and patient's items placed inside bag for transport.  Son notified earlier by [place] of transport time and location.  The son had no concerns or questions about the patient's discharge.  Foley removed prior to transport services arrival.

## 2015-07-09 IMAGING — CR DG HIP COMPLETE 2+V*L*
1 series · 4 of 4 positions shown · non-contrast
Comparison: None.

CLINICAL DATA: Acute onset of left hip pain after fall. Initial
encounter.

EXAM:
LEFT HIP (WITH PELVIS) 2-3 VIEWS

[Series 1: dxr hip left complete · 0.14mm/px · 4 of 4 slices shown]
[im 1/4]
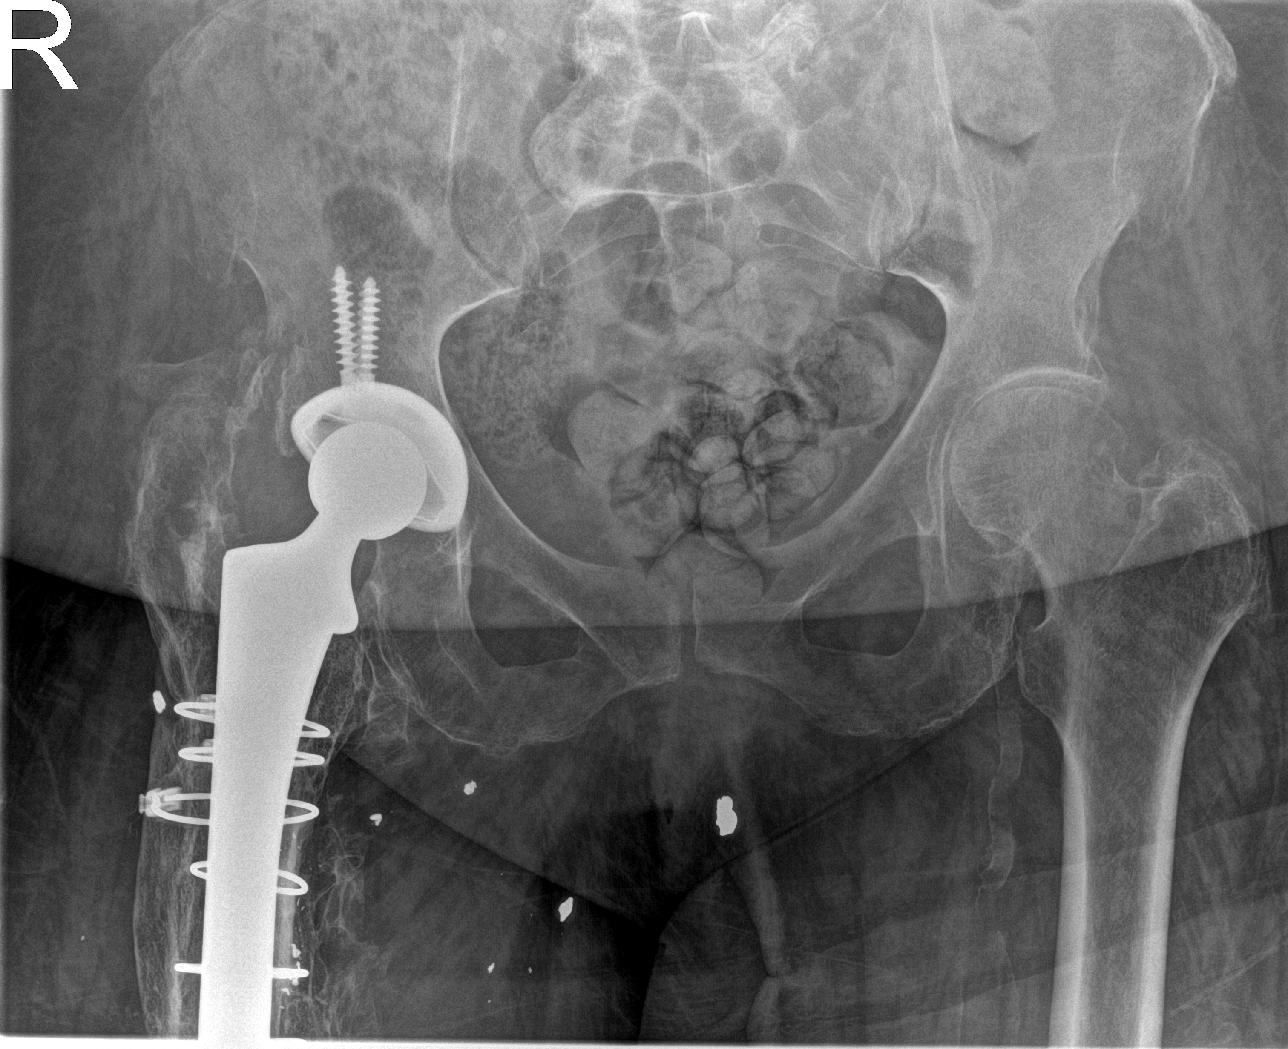
[im 2/4]
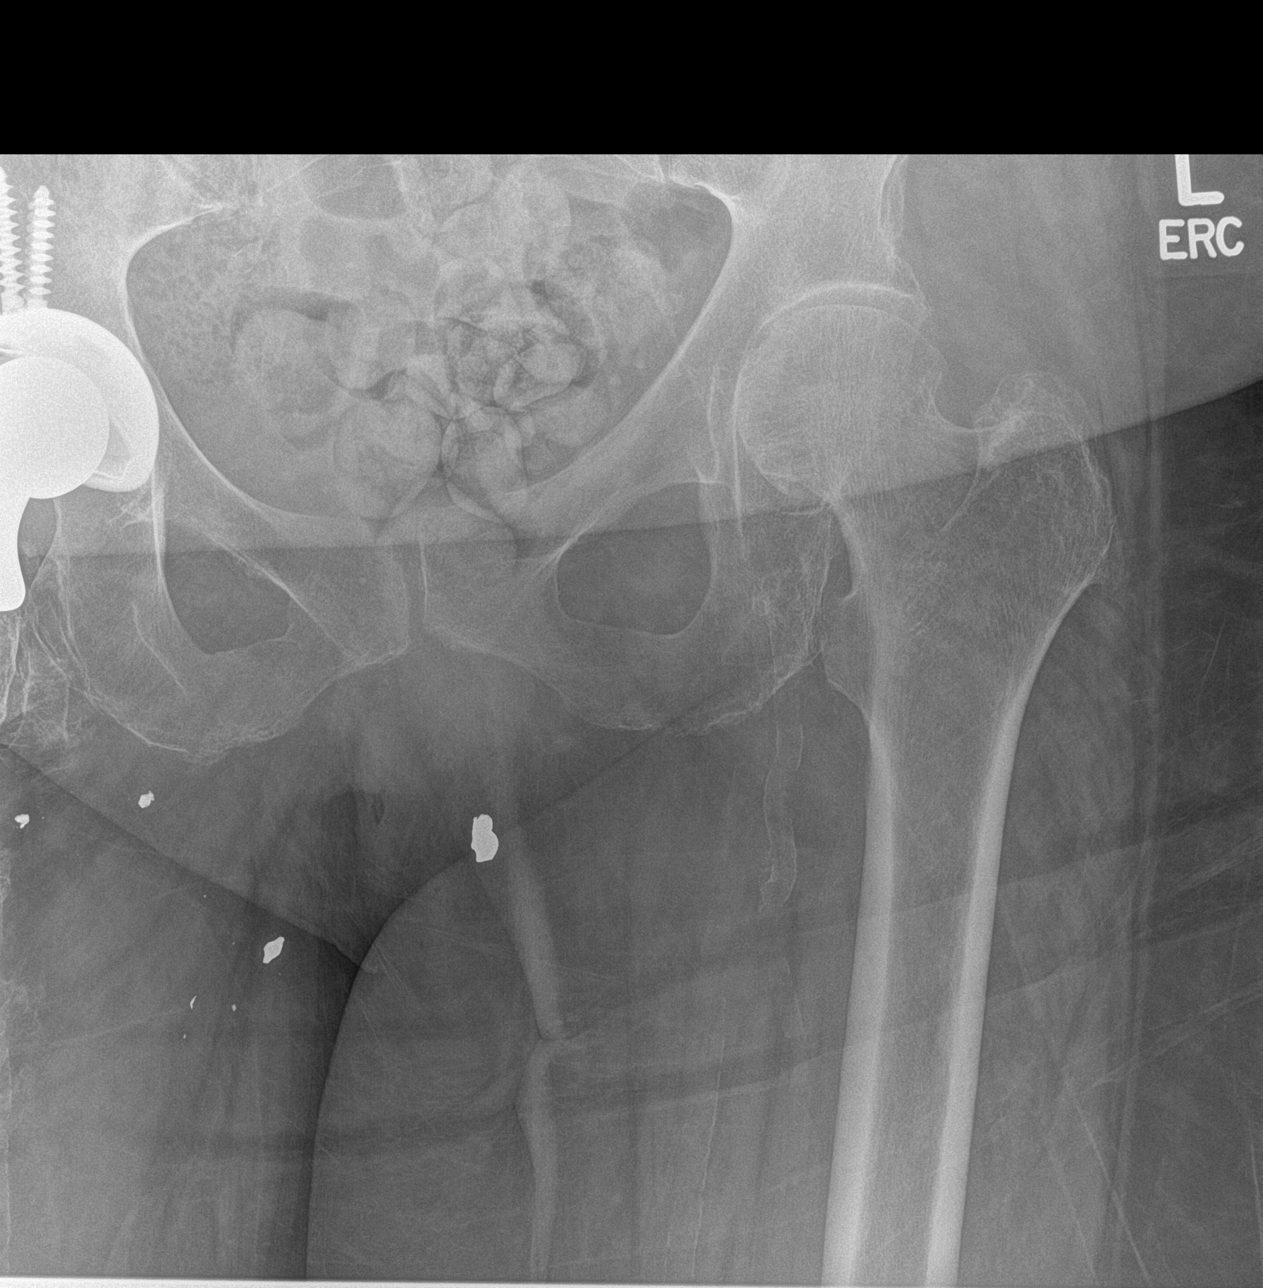
[im 3/4]
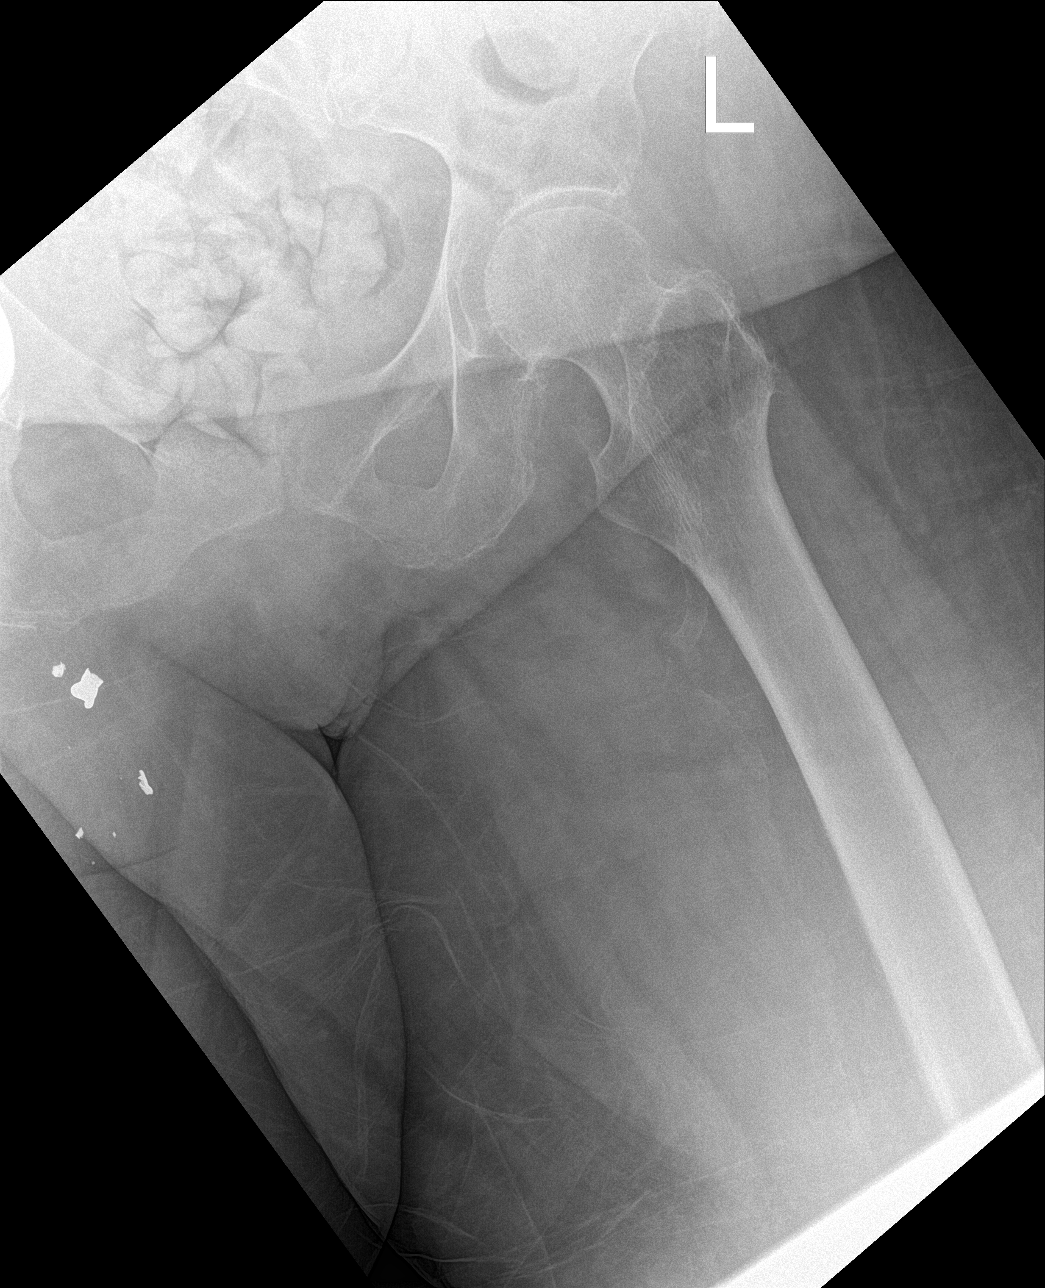
[im 4/4]
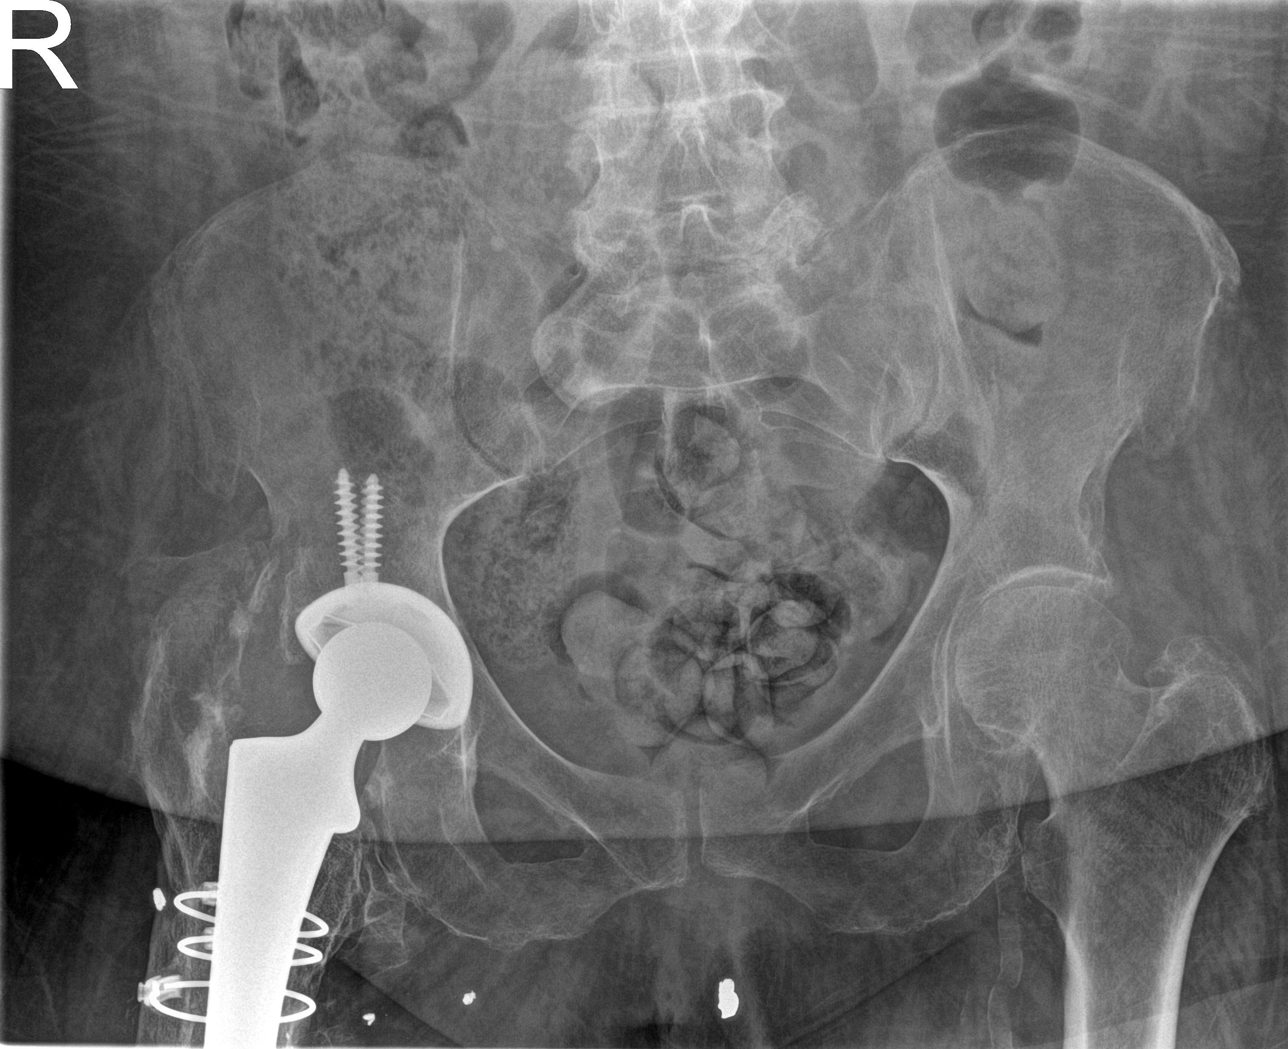

[4 of 4 positions shown; findings below may reference images not displayed]

FINDINGS: There is no evidence of fracture or dislocation. The left femoral
head remains seated at the acetabulum, with underlying axial joint
space narrowing. The proximal left femur appears intact. The
sacroiliac joints are unremarkable in appearance.

There appears to be chronic loosening about the femoral component of
the patient's right hip prosthesis, with corresponding cerclage
wires noted. Overlying heterotopic bone formation is noted.

The visualized bowel gas pattern is grossly unremarkable in
appearance. Scattered vascular calcifications are seen.
IMPRESSION: 1. No evidence of acute fracture or dislocation.
2. Apparent chronic loosening about the femoral component of the
patient's right hip prosthesis, with corresponding cerclage wires
noted. Overlying heterotopic bone formation noted.
3. Scattered vascular calcifications noted.

## 2015-07-09 IMAGING — CT CT OF THE LEFT HIP WITHOUT CONTRAST
1 series · 15 of 32 positions shown, 19 images · non-contrast
Comparison: Left hip radiographs performed earlier today at [DATE]
a.m.

CLINICAL DATA: Status post fall. Persistent left hip pain and
difficulty bearing weight. Initial encounter.

EXAM:
CT OF THE LEFT HIP WITHOUT CONTRAST
TECHNIQUE: Multidetector CT imaging of the left hip was performed according to
the standard protocol. Multiplanar CT image reconstructions were
also generated.

[Series 6: soft tissue axials · axial · 0.40mm/px · z∈[-278,-130]mm · 15 of 83 slices shown, 19 images]
[im 6/83  soft-tissue]
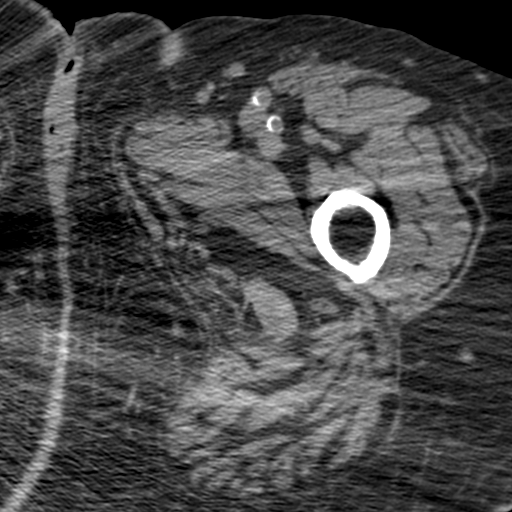
[im 6/83  bone]
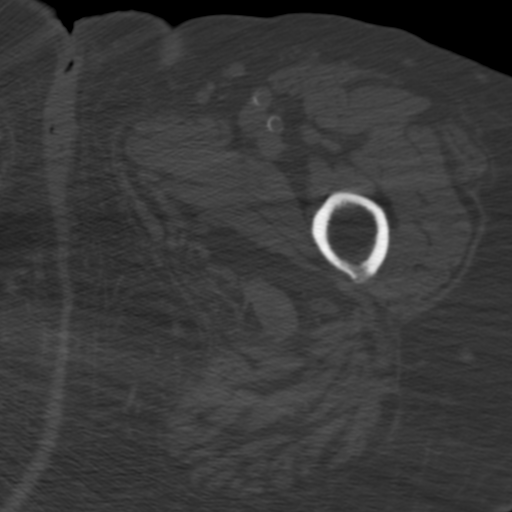
[im 11/83  soft-tissue]
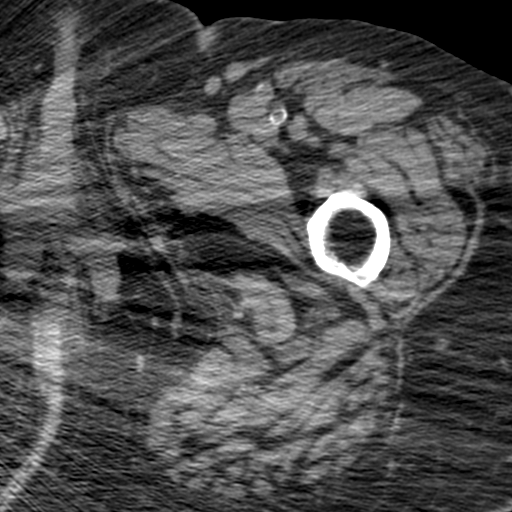
[im 16/83  soft-tissue]
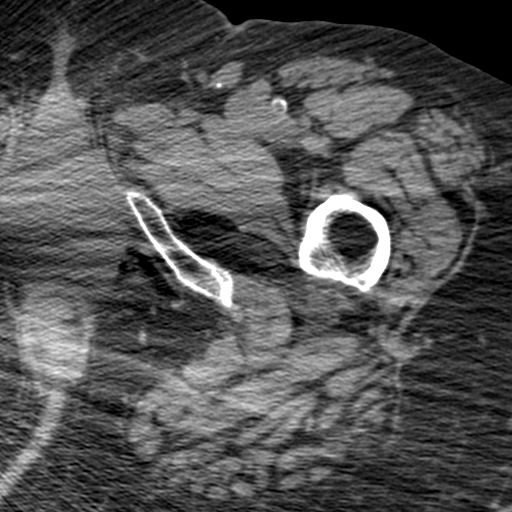
[im 24/83  soft-tissue]
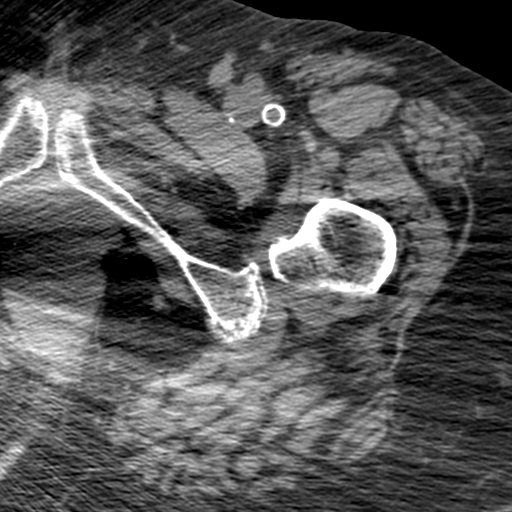
[im 30/83  soft-tissue]
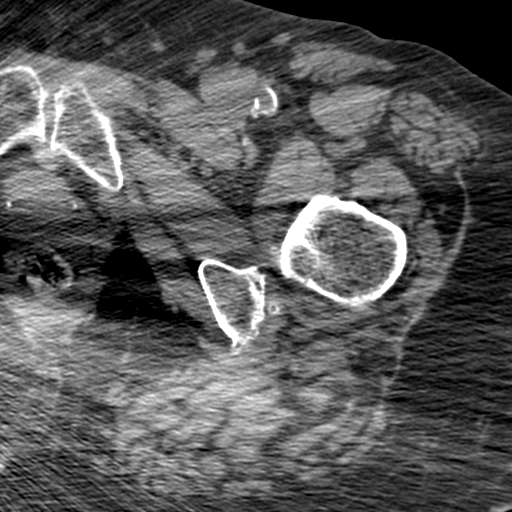
[im 35/83  soft-tissue]
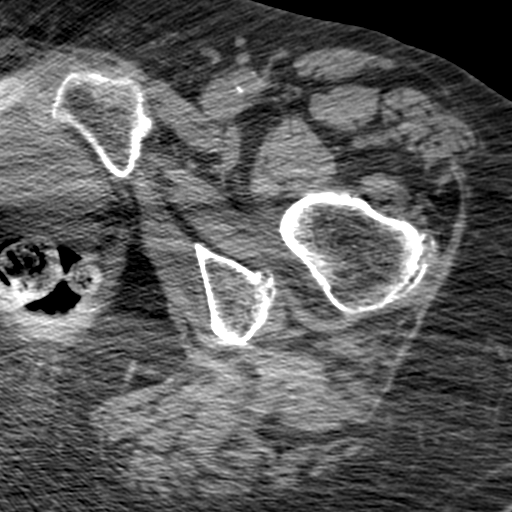
[im 43/83  soft-tissue]
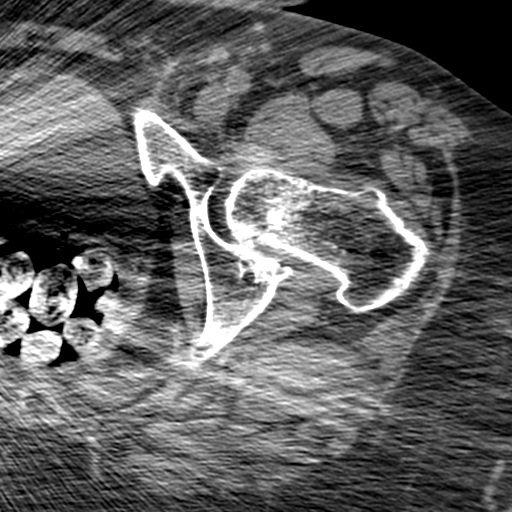
[im 48/83  soft-tissue]
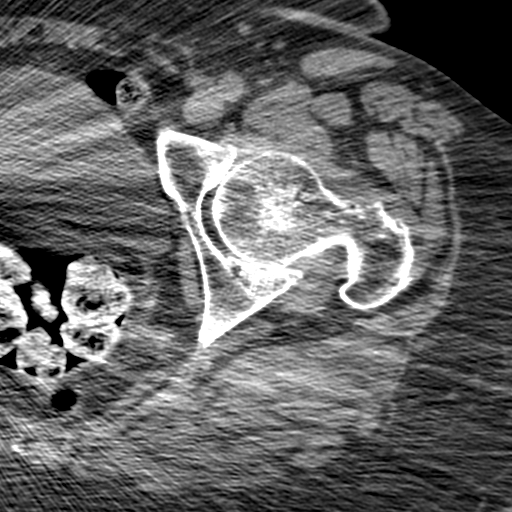
[im 53/83  soft-tissue]
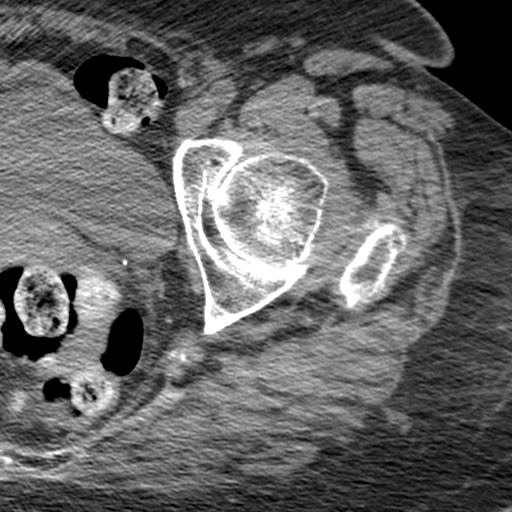
[im 53/83  bone]
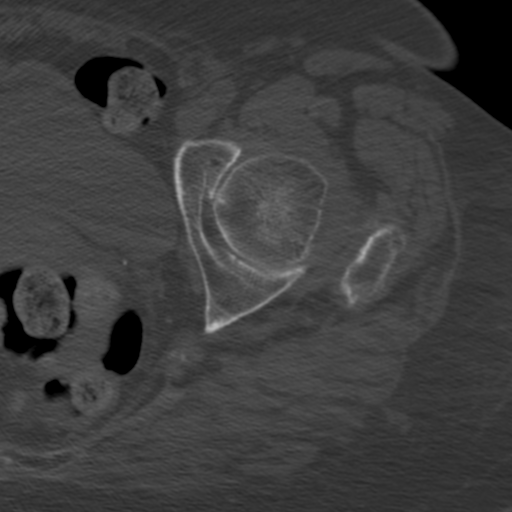
[im 59/83  soft-tissue]
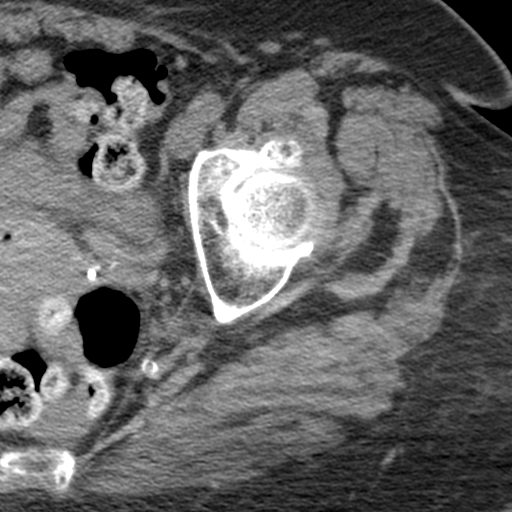
[im 67/83  soft-tissue]
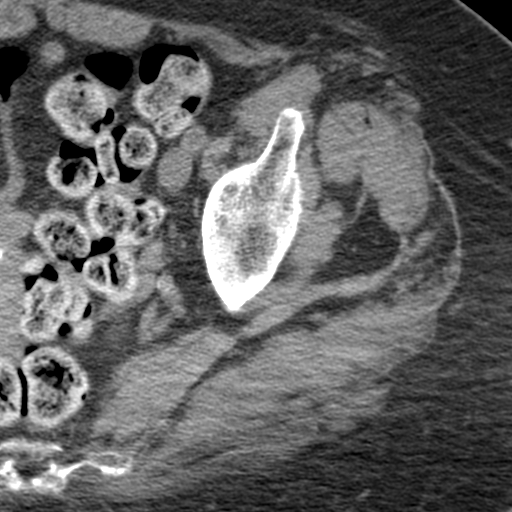
[im 72/83  soft-tissue]
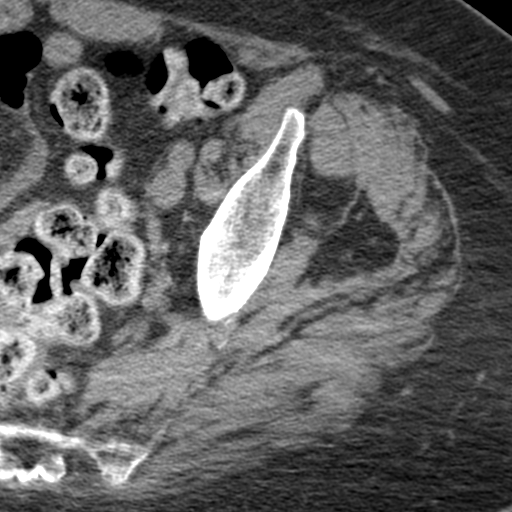
[im 72/83  lung]
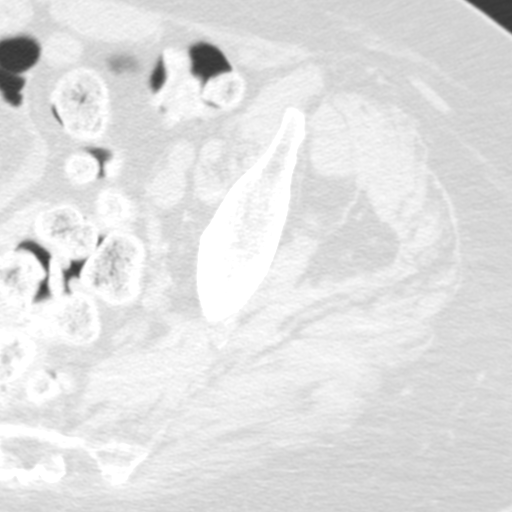
[im 75/83  lung]
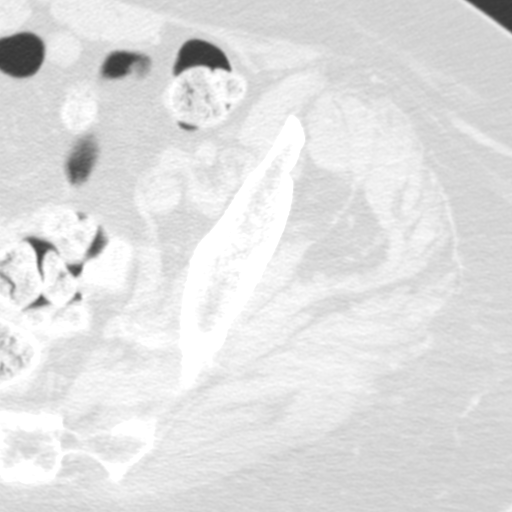
[im 77/83  soft-tissue]
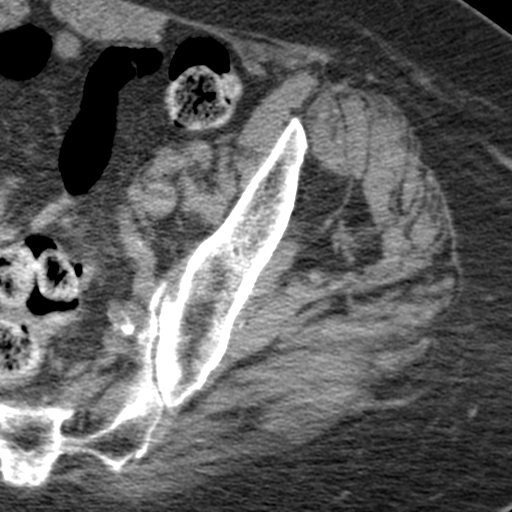
[im 77/83  lung]
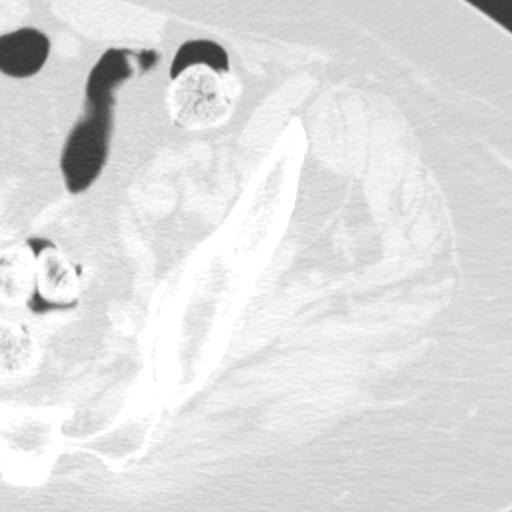
[im 80/83  lung]
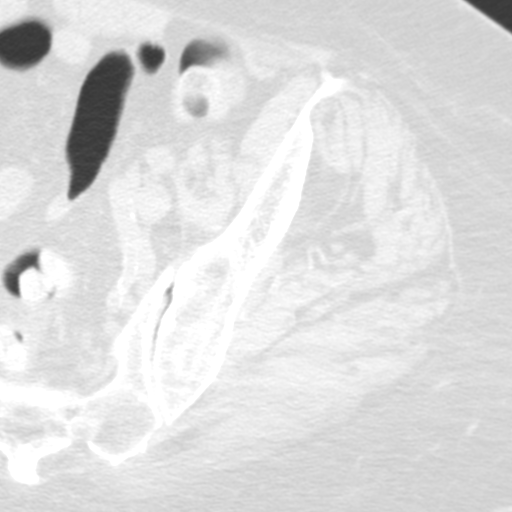

[15 of 32 positions shown; findings below may reference images not displayed]

FINDINGS: There is no evidence of fracture or dislocation. The left femoral
neck is unremarkable in appearance. The left femoral head remains
seated at the acetabulum. Mild axial joint space narrowing is noted.
Minimal associated subcortical cystic change is noted at the femoral
head and acetabulum.

The left superior and inferior pubic rami are within normal limits.
The pubic symphysis is unremarkable in appearance. The left
sacroiliac joint demonstrates vacuum phenomenon.

Stool is noted partially filling the visualized portions of the
colon. Scattered vascular calcifications are seen. A metallic
density is noted at the level of the perineum.
IMPRESSION: 1. No evidence of fracture or dislocation.
2. Mild axial joint space narrowing at the left hip. Minimal
subcortical cystic change seen at the femoral head and acetabulum.
3. Scattered vascular calcifications seen.

## 2016-06-29 ENCOUNTER — Emergency Department
Admission: EM | Admit: 2016-06-29 | Discharge: 2016-06-30 | Disposition: A | Discharge status: Home / Self Care | Payer: Medicare Other | Attending: Emergency Medicine | Admitting: Emergency Medicine

## 2016-06-29 ENCOUNTER — Encounter: Payer: Self-pay | Admitting: Emergency Medicine

## 2016-06-29 DIAGNOSIS — N823 Fistula of vagina to large intestine: Secondary | ICD-10-CM | POA: Diagnosis not present

## 2016-06-29 NOTE — ED Triage Notes (Signed)
Pt to rm 10 via EMS from [facility], per EMS, facility reports stool in pt's vagina and family wants pt evaluated.  At baseline, pt w/ confusion/dementia.  Pt NAD at this time.

## 2016-06-29 NOTE — ED Notes (Signed)
Pt's vagina cleaned, stool cleaned from opening, no stool appears to be deeper in vagina upon assessment.

## 2016-06-30 ENCOUNTER — Emergency Department: Payer: Medicare Other

## 2016-06-30 DIAGNOSIS — N823 Fistula of vagina to large intestine: Secondary | ICD-10-CM | POA: Diagnosis not present

## 2016-06-30 LAB — CBC
HEMATOCRIT: 41.6 % (ref 35.0–47.0)
HEMOGLOBIN: 14.1 g/dL (ref 12.0–16.0)
MCH: 32.5 pg (ref 26.0–34.0)
MCHC: 33.8 g/dL (ref 32.0–36.0)
MCV: 96 fL (ref 80.0–100.0)
Platelets: 198 10*3/uL (ref 150–440)
RBC: 4.34 MIL/uL (ref 3.80–5.20)
RDW: 14.3 % (ref 11.5–14.5)
WBC: 6.3 10*3/uL (ref 3.6–11.0)

## 2016-06-30 LAB — COMPREHENSIVE METABOLIC PANEL
ALBUMIN: 3 g/dL — AB (ref 3.5–5.0)
ALK PHOS: 77 U/L (ref 38–126)
ALT: 11 U/L — AB (ref 14–54)
ANION GAP: 7 (ref 5–15)
AST: 20 U/L (ref 15–41)
BILIRUBIN TOTAL: 0.7 mg/dL (ref 0.3–1.2)
BUN: 8 mg/dL (ref 6–20)
CO2: 23 mmol/L (ref 22–32)
CREATININE: 0.62 mg/dL (ref 0.44–1.00)
Calcium: 8.8 mg/dL — ABNORMAL LOW (ref 8.9–10.3)
Chloride: 108 mmol/L (ref 101–111)
GFR calc Af Amer: >60 mL/min (ref >60)
GFR calc non Af Amer: >60 mL/min (ref >60)
GLUCOSE: 99 mg/dL (ref 65–99)
Potassium: 4.1 mmol/L (ref 3.5–5.1)
SODIUM: 138 mmol/L (ref 135–145)
Total Protein: 6.5 g/dL (ref 6.5–8.1)

## 2016-06-30 MED ORDER — DIATRIZOATE MEGLUMINE & SODIUM 66-10 % PO SOLN
30.0000 mL | Freq: Once | ORAL | Status: AC
Start: 2016-06-30 — End: 2016-06-30
  Administered 2016-06-30: 15 mL via ORAL

## 2016-06-30 MED ORDER — IOPAMIDOL (ISOVUE-300) INJECTION 61%
100.0000 mL | Freq: Once | INTRAVENOUS | Status: AC | PRN
  Administered 2016-06-30: 100 mL via INTRAVENOUS

## 2016-06-30 NOTE — ED Provider Notes (Signed)
[facility] Emergency Department Provider Note  ____________________________________________   First MD Initiated Contact with Patient 06/30/16 0000     (approximate)  I have reviewed the triage vital signs and the nursing notes.   HISTORY  Chief Complaint Other   HPI [name] is a 75 y.o. female with history of atrial fibrillation CVA seizures presents with history from [facility] facility of stool coming from the patient's vagina. Patient's sister and son at bedside states that they were notified of the same.   Past Medical History:  Diagnosis Date  . A-fib ([place]) 02/14/2015  . Arthritis   . Atrial fibrillation ([place])   . Complication of anesthesia    DIFFICULTY WAKING   . CVA (cerebral infarction)   . CVA (cerebral infarction) 02/14/2015  . GERD (gastroesophageal reflux disease) 02/14/2015  . GERD without esophagitis 02/14/2015  . Seizure ([place]) 02/14/2015  . Seizures [facility])     Patient Active Problem List   Diagnosis Date Noted  . Fall   . Knee fracture, left 03/29/2015  . UTI (lower urinary tract infection) 03/29/2015  . Mental status, decreased 02/14/2015  . A-fib ([place]) 02/14/2015  . CVA (cerebral infarction) 02/14/2015  . GERD (gastroesophageal reflux disease) 02/14/2015  . Seizure ([place]) 02/14/2015  . Dementia 02/14/2015  . GERD without esophagitis 02/14/2015  . History of CVA (cerebrovascular accident)     Past Surgical History:  Procedure Laterality Date  . FRACTURE SURGERY    . JOINT REPLACEMENT    . TOTAL HIP ARTHROPLASTY      Prior to Admission medications   Medication Sig Start Date End Date Taking? Authorizing Provider  acetaminophen (TYLENOL) 325 MG tablet Take 325 mg by mouth every 6 (six) hours as needed for moderate pain.    Historical Provider, MD  apixaban (ELIQUIS) 5 MG TABS tablet Take 1 tablet (5 mg total) by mouth 2 (two) times daily. 04/01/15   Reyne Dumas, MD  Cholecalciferol 1000 UNITS capsule  Take 1,000 Units by mouth daily.    Historical Provider, MD  diclofenac sodium (VOLTAREN) 1 % GEL Apply 2 g topically 4 (four) times daily.    Historical Provider, MD  donepezil (ARICEPT) 5 MG tablet Take 1 tablet (5 mg total) by mouth at bedtime. 04/01/15   Reyne Dumas, MD  esomeprazole (NEXIUM) 40 MG capsule Take 40 mg by mouth daily before breakfast.      Historical Provider, MD  furosemide (LASIX) 20 MG tablet Take 20 mg by mouth daily.      Historical Provider, MD  HYDROcodone-acetaminophen (NORCO/VICODIN) 5-325 MG per tablet Take 1-2 tablets by mouth every 4 (four) hours as needed for moderate pain. 04/01/15   Reyne Dumas, MD  ketotifen (ZADITOR) 0.025 % ophthalmic solution Place 1 drop into both eyes daily as needed (for dry eyes).    Historical Provider, MD  levETIRAcetam (KEPPRA) 500 MG tablet Take 500 mg by mouth 2 (two) times daily.      Historical Provider, MD  LORazepam (ATIVAN) 1 MG tablet Take 1 tablet (1 mg total) by mouth every 8 (eight) hours as needed for anxiety or sleep. 04/01/15   Reyne Dumas, MD  metoprolol (LOPRESSOR) 50 MG tablet Take 50 mg by mouth daily.      Historical Provider, MD  niacin 500 MG tablet Take 500 mg by mouth daily with breakfast.      Historical Provider, MD  polyethylene glycol (MIRALAX) packet Take 17 g by mouth daily. 04/01/15   Ascencion Dike  Abrol, MD  potassium chloride (KLOR-CON) 10 MEQ CR tablet Take 10 mEq by mouth daily.      Historical Provider, MD  QUEtiapine (SEROQUEL) 50 MG tablet Take 1 tablet (50 mg total) by mouth at bedtime. 04/01/15   Reyne Dumas, MD  simvastatin (ZOCOR) 40 MG tablet Take 40 mg by mouth at bedtime.      Historical Provider, MD    Allergies No known drug allergies  Family History  Problem Relation Age of Onset  . Atrial fibrillation Sister     Social History Social History  Substance Use Topics  . Smoking status: Never Smoker  . Smokeless tobacco: Never Used  . Alcohol use No    Review of Systems Constitutional:  No fever/chills Eyes: No visual changes. ENT: No sore throat. Cardiovascular: Denies chest pain. Respiratory: Denies shortness of breath. Gastrointestinal: No abdominal pain.  No nausea, no vomiting.  No diarrhea.  No constipation. Genitourinary: Negative for dysuria. Positive for stool from the vagina Musculoskeletal: Negative for back pain. Skin: Negative for rash. Neurological: Negative for headaches, focal weakness or numbness.  10-point ROS otherwise negative.  ____________________________________________   PHYSICAL EXAM:  VITAL SIGNS: ED Triage Vitals  Enc Vitals Group     BP 06/29/16 2252 115/72     Pulse Rate 06/29/16 2252 94     Resp 06/29/16 2252 18     Temp 06/29/16 2252 98.9 F (37.2 C)     Temp Source 06/29/16 2252 Oral     SpO2 06/29/16 2252 100 %     Weight 06/29/16 2254 250 lb (113.4 kg)     Height 06/29/16 2254 5\' 5"  (1.651 m)     Head Circumference --      Peak Flow --      Pain Score --      Pain Loc --      Pain Edu? --      Excl. in [place]? --     Constitutional: Alert and oriented. Well appearing and in no acute distress. Eyes: Conjunctivae are normal. PERRL. EOMI. Head: Atraumatic. Mouth/Throat: Mucous membranes are moist.  Oropharynx non-erythematous. Neck: No stridor.  No meningeal signs.   Cardiovascular: Normal rate, regular rhythm. Good peripheral circulation. Grossly normal heart sounds.   Respiratory: Normal respiratory effort.  No retractions. Lungs CTAB. Gastrointestinal: Soft and nontender. No distention.  Genitourinary: Digital exam revealed liquid stool in the vagina. Speculum exam not performed secondary to patient's rigidity Musculoskeletal: No lower extremity tenderness nor edema. No gross deformities of extremities. Neurologic:  Normal speech and language. No gross focal neurologic deficits are appreciated.  Skin:  Skin is warm, dry and intact. No rash noted.   ____________________________________________   LABS (all labs  ordered are listed, but only abnormal results are displayed)  Labs Reviewed  CBC  COMPREHENSIVE METABOLIC PANEL    RADIOLOGY I, [place], personally viewed and evaluated these images (plain radiographs) as part of my medical decision making, as well as reviewing the written report by the radiologist.  No results found.   Procedures      INITIAL IMPRESSION / ASSESSMENT AND PLAN / ED COURSE  Pertinent labs & imaging results that were available during my care of the patient were reviewed by me and considered in my medical decision making (see chart for details).  I informed the patient and her son of the CT scan findings concerning for rectovaginal fistula. Following doing so I discussed the patient with Dr. Marcelline Mates OB/GYN on-call who recommended outpatient follow-up.  Clinical Course    ____________________________________________  FINAL CLINICAL IMPRESSION(S) / ED DIAGNOSES  Final diagnoses:  Rectovaginal fistula     MEDICATIONS GIVEN DURING THIS VISIT:  Medications  diatrizoate meglumine-sodium (GASTROGRAFIN) 66-10 % solution 30 mL (15 mLs Oral Given 06/30/16 0040)     NEW OUTPATIENT MEDICATIONS STARTED DURING THIS VISIT:  New Prescriptions   No medications on file      Note:  This document was prepared using Dragon voice recognition software and may include unintentional dictation errors.    Gregor Hams, MD 06/30/16 [phone]

## 2016-07-03 ENCOUNTER — Ambulatory Visit (INDEPENDENT_AMBULATORY_CARE_PROVIDER_SITE_OTHER): Payer: Medicare Other | Admitting: Obstetrics and Gynecology

## 2016-07-03 VITALS — BP 110/70 | HR 52 | Ht 65.0 in | Wt 250.0 lb

## 2016-07-03 DIAGNOSIS — F05 Delirium due to known physiological condition: Secondary | ICD-10-CM

## 2016-07-03 DIAGNOSIS — R4182 Altered mental status, unspecified: Secondary | ICD-10-CM

## 2016-07-03 DIAGNOSIS — I4891 Unspecified atrial fibrillation: Secondary | ICD-10-CM | POA: Diagnosis not present

## 2016-07-03 DIAGNOSIS — N823 Fistula of vagina to large intestine: Secondary | ICD-10-CM

## 2016-07-03 DIAGNOSIS — Z87440 Personal history of urinary (tract) infections: Secondary | ICD-10-CM | POA: Diagnosis not present

## 2016-07-03 DIAGNOSIS — F0390 Unspecified dementia without behavioral disturbance: Secondary | ICD-10-CM

## 2016-07-03 DIAGNOSIS — I639 Cerebral infarction, unspecified: Secondary | ICD-10-CM | POA: Diagnosis not present

## 2016-07-03 DIAGNOSIS — D259 Leiomyoma of uterus, unspecified: Secondary | ICD-10-CM | POA: Diagnosis not present

## 2016-07-03 NOTE — Progress Notes (Signed)
GYNECOLOGY PROGRESS NOTE  Subjective:    Patient ID: [name], female    DOB: [birth year], 75 y.o.   MRN: [id number]  HPI  Patient is a 75 y.o. P32 female who presents for f/u from Emergency Room secondary to diagnosis of rectovaginal fistula. 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  Per family members, they were notified by patient's nursing facility ([facility]) of stool coming from the patient's vagina. Seen in the Emergency Room where patient had a CT scan with mention of possible fistula.   Is currently wheelchair bound due to stroke. History is provided by patient's son today s patient has limited speech capabilities.    In addition, patient has noted to have recurrent UTI's over the past year, and also has been exhibiting episodes of delirium.  Per patient's son, the delirium episodes do tend to occur around the same time as the UTI.    Past Medical History:  Diagnosis Date  . A-fib ([place]) 02/14/2015  . Arthritis   . Atrial fibrillation ([place])   . Complication of anesthesia    DIFFICULTY WAKING   . CVA (cerebral infarction)   . CVA (cerebral infarction) 02/14/2015  . GERD (gastroesophageal reflux disease) 02/14/2015  . GERD without esophagitis 02/14/2015  . Seizure ([place]) 02/14/2015  . Seizures ([place])     Family History  Problem Relation Age of Onset  . Atrial fibrillation Sister     Past Surgical History:  Procedure Laterality Date  . FRACTURE SURGERY    . JOINT REPLACEMENT    . TOTAL HIP ARTHROPLASTY      Social History   Social History  . Marital status: Widowed    Spouse name: N/A  . Number of children: N/A  . Years of education: N/A   Occupational History  . Not on file.   Social History Main Topics  . Smoking status: Never Smoker  . Smokeless tobacco: Never Used  . Alcohol use No  . Drug use: No  . Sexual activity: No   Other Topics Concern  . Not on file     Social History Narrative  . No narrative on file    Current Outpatient Prescriptions on File Prior to Visit  Medication Sig Dispense Refill  . acetaminophen (TYLENOL) 325 MG tablet Take 325 mg by mouth every 6 (six) hours as needed for moderate pain.    . Amino Acids-Protein Hydrolys (FEEDING SUPPLEMENT, PRO-STAT 64,) LIQD Take 30 mLs by mouth daily.    Marland Kitchen apixaban (ELIQUIS) 5 MG TABS tablet Take 1 tablet (5 mg total) by mouth 2 (two) times daily. 60 tablet 5  . ascorbic acid (VITAMIN C) 500 MG tablet Take 500 mg by mouth daily.    . Cholecalciferol 1000 UNITS capsule Take 1,000 Units by mouth daily.    . diclofenac sodium (VOLTAREN) 1 % GEL Apply 2 g topically 4 (four) times daily.    . divalproex (DEPAKOTE SPRINKLES) 125 MG capsule Take 125 mg by mouth at bedtime.    . donepezil (ARICEPT) 5 MG tablet Take 1 tablet (5 mg total) by mouth at bedtime. (Patient taking differently: Take 15 mg by mouth at bedtime. ) 30 tablet 0  . esomeprazole (NEXIUM) 20 MG capsule Take 20 mg by mouth daily at 12 noon.    Marland Kitchen esomeprazole (NEXIUM) 40 MG capsule Take 40 mg by mouth daily before breakfast.      . furosemide (LASIX)  20 MG tablet Take 20 mg by mouth daily.      Marland Kitchen HYDROcodone-acetaminophen (NORCO/VICODIN) 5-325 MG per tablet Take 1-2 tablets by mouth every 4 (four) hours as needed for moderate pain. 30 tablet 0  . ketotifen (ZADITOR) 0.025 % ophthalmic solution Place 1 drop into both eyes daily as needed (for dry eyes).    Marland Kitchen levETIRAcetam (KEPPRA) 500 MG tablet Take 500 mg by mouth daily.     Marland Kitchen LORazepam (ATIVAN) 1 MG tablet Take 1 tablet (1 mg total) by mouth every 8 (eight) hours as needed for anxiety or sleep. 30 tablet 0  . memantine (NAMENDA) 10 MG tablet Take 10 mg by mouth 2 (two) times daily.    . metoprolol (LOPRESSOR) 50 MG tablet Take 50 mg by mouth daily.      . niacin 500 MG tablet Take 500 mg by mouth daily with breakfast.      . polyethylene glycol (MIRALAX) packet Take 17 g by mouth  daily. 14 each 0  . potassium chloride (KLOR-CON) 10 MEQ CR tablet Take 10 mEq by mouth daily.      . QUEtiapine (SEROQUEL) 50 MG tablet Take 1 tablet (50 mg total) by mouth at bedtime. 30 tablet 2  . simvastatin (ZOCOR) 20 MG tablet Take 20 mg by mouth daily.    . simvastatin (ZOCOR) 40 MG tablet Take 40 mg by mouth at bedtime.      . sulfamethoxazole-trimethoprim (BACTRIM DS,SEPTRA DS) 800-160 MG tablet Take 1 tablet by mouth 2 (two) times daily.     No current facility-administered medications on file prior to visit.     No Known Allergies  Review of Systems Review of systems not obtained due to patient factors.   Objective:   Blood pressure 110/70, pulse (!) 52, height 5\' 5"  (1.651 m), weight 250 lb (113.4 kg). General appearance: alert, cooperative, no distress and moderately obese Abdomen: soft, non-tender; bowel sounds normal; no masses,  no organomegaly Pelvic: Exam very limited due to patient's mobility, had to examine patient partially reclined in wheelchair. The external genitalia normal and bimanual exam with no palpable defect noted in the vagina or rectum.  No stool noted on glove from vaginal exam. Speculum exam unable to be performed. Extremities: extremities normal, atraumatic, no cyanosis or edema Skin: no rashes or lesions noted.    Imaging (CT scan Abdomen/Pelvis 06/30/2016):  CLINICAL DATA:  Feces in the vagina. Clinical concern for a rectovaginal fistula.  EXAM: CT ABDOMEN AND PELVIS WITH CONTRAST  TECHNIQUE: Multidetector CT imaging of the abdomen and pelvis was performed using the standard protocol following bolus administration of intravenous contrast.  CONTRAST:  132mL ISOVUE-300 IOPAMIDOL (ISOVUE-300) INJECTION 61%  COMPARISON:  None.  FINDINGS: Lower chest: Small left pleural effusion. Mild elevation of the left hemidiaphragm. Left basilar atelectasis. Minimal right basilar atelectasis.  Hepatobiliary: Cholecystectomy clips. Small, otherwise  normal appearing liver.  Pancreas: Diffuse pancreatic atrophy.  Spleen: Within normal limits in size and appearance.  Adrenals/Urinary Tract: Mild right renal cortical scarring and small cyst. Unremarkable left kidney, ureters, urinary bladder and adrenal glands. No urinary tract calculi or hydronephrosis.  Stomach/Bowel: There is medium density material and gas in the vagina with appears similar to the material in the adjacent rectum. This is also extending into the cervix and lower uterine segment. No gastric or small bowel abnormalities. Normal appearing appendix.  Vascular/Lymphatic: Atheromatous arterial calcifications, including the abdominal aorta and coronary arteries. No enlarged lymph nodes.  Reproductive: There are ill-defined uterine masses.  The largest measures approximately 3.5 cm in maximum diameter on sagittal image number 48, containing some coarse calcification. No adnexal masses.  Other: None.  Musculoskeletal: Lumbar and lower thoracic spine degenerative changes. L5 vertebral body hemangioma. Right total hip prosthesis. Moderate to marked left hip degenerative changes.  IMPRESSION: 1. CT findings concerning for a rectovaginal fistula. 2. Small left pleural effusion. 3. Mild elevation of the left hemidiaphragm with mild left basilar atelectasis. 4. Minimal right basilar atelectasis. 5. Diffuse pancreatic atrophy. 6. Aortic atherosclerosis and coronary artery atherosclerosis. 7. Multiple uterine fibroids.  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  On exam today there was no evidence of stool in the vaginal vault, however exam was limited due to patient's limited mobility. Discussed that depending on the size of the fistula, this would determine if this is something that required  surgical intervention vs conservative measures.  Also discussed that as patient has several comorbidities, this may affect her ability to have surgical intervention performed. Patient's family notes they would like to have full evaluation and have surgical intervention if possible. Would like to have second opinion and be referred to [facility].  We'll refer. We'll also have patient scheduled for a rectal ultrasound to better identify possible fistula location. - Patient was also diagnosed with fibroid uterus on CT scan. Discussed with family members that fibroids were benign and usually do not cause problems in the postmenopausal stage. No intervention is required at this time. - History of recurrent UTIs could be due to hygiene (patient is a nursing home where she is not changed frequently), or could be due to possible rectovaginal fistula. Discussed maintaining appropriate hygiene which is to be reiterated with nursing care facility. We'll also further workup for rectovaginal fistula present. Discussed keeping bowels bulky reducing episodes of diarrhea which could reduce any possible stool leakage from the vagina. - Episodes of delirium could be multifactorial, however as they tend to occur with patient's UTIs this is likely the cause. Patient may benefit from UTI prophylaxis if no major intervention is performed on rectovaginal fistula.  A total of 45 minutes were spent face-to-face with the patient during the encounter with greater than 50% dealing with counseling and coordination of care.  Rubie Maid, MD [facility]

## 2016-07-09 ENCOUNTER — Other Ambulatory Visit: Payer: Self-pay | Admitting: Obstetrics and Gynecology

## 2016-07-09 DIAGNOSIS — F05 Delirium due to known physiological condition: Secondary | ICD-10-CM | POA: Insufficient documentation

## 2016-07-09 DIAGNOSIS — N823 Fistula of vagina to large intestine: Secondary | ICD-10-CM

## 2016-07-09 MED ORDER — BARIUM SULFATE 0.1 % PO SUSP: 450.0000 mL | Freq: Once | ORAL | 0 refills | Status: AC

## 2016-07-16 ENCOUNTER — Ambulatory Visit
Admission: RE | Admit: 2016-07-16 | Discharge: 2016-07-16 | Disposition: A | Discharge status: Home / Self Care | Payer: Medicare Other | Source: Ambulatory Visit | Attending: Obstetrics and Gynecology | Admitting: Obstetrics and Gynecology

## 2016-07-16 ENCOUNTER — Ambulatory Visit: Payer: Medicare Other

## 2016-07-16 DIAGNOSIS — K6289 Other specified diseases of anus and rectum: Secondary | ICD-10-CM | POA: Insufficient documentation

## 2016-07-16 DIAGNOSIS — N823 Fistula of vagina to large intestine: Secondary | ICD-10-CM

## 2016-07-16 MED ORDER — IOTHALAMATE MEGLUMINE 17.2 % UR SOLN
750.0000 mL | Freq: Once | URETHRAL | Status: AC | PRN
  Administered 2016-07-16: 750 mL via INTRAVESICAL

## 2016-07-18 ENCOUNTER — Telehealth: Payer: Self-pay | Admitting: Obstetrics and Gynecology

## 2016-07-18 NOTE — Telephone Encounter (Signed)
Her son called and said they have't heard anything from results of [place] form th hospital. He was wanting the results.

## 2016-07-19 ENCOUNTER — Ambulatory Visit: Payer: Medicare Other

## 2016-07-20 NOTE — Telephone Encounter (Signed)
Called pt's son informed him of the dx of rectovaginal fistula (per Dr.De's u/s note) Informed son that referral to [facility] has been. Son states that he has not heard back from [facility]. Will have referral coordinator check on referral.

## 2016-10-05 DIAGNOSIS — M1990 Unspecified osteoarthritis, unspecified site: Secondary | ICD-10-CM | POA: Insufficient documentation

## 2016-10-05 DIAGNOSIS — I639 Cerebral infarction, unspecified: Secondary | ICD-10-CM | POA: Insufficient documentation

## 2016-10-05 DIAGNOSIS — T7840XA Allergy, unspecified, initial encounter: Secondary | ICD-10-CM | POA: Insufficient documentation

## 2016-10-05 DIAGNOSIS — Z8739 Personal history of other diseases of the musculoskeletal system and connective tissue: Secondary | ICD-10-CM | POA: Insufficient documentation

## 2016-10-05 DIAGNOSIS — E785 Hyperlipidemia, unspecified: Secondary | ICD-10-CM | POA: Insufficient documentation

## 2016-10-05 DIAGNOSIS — R7302 Impaired glucose tolerance (oral): Secondary | ICD-10-CM | POA: Insufficient documentation

## 2016-10-10 ENCOUNTER — Ambulatory Visit (INDEPENDENT_AMBULATORY_CARE_PROVIDER_SITE_OTHER): Payer: Medicare Other | Admitting: Surgery

## 2016-10-10 ENCOUNTER — Encounter: Payer: Self-pay | Admitting: Surgery

## 2016-10-10 VITALS — BP 133/88 | HR 77 | Temp 98.0°F

## 2016-10-10 DIAGNOSIS — N824 Other female intestinal-genital tract fistulae: Secondary | ICD-10-CM

## 2016-10-10 MED ORDER — SULFAMETHOXAZOLE-TRIMETHOPRIM 800-160 MG PO TABS: 1.0000 | ORAL_TABLET | Freq: Two times a day (BID) | ORAL | 1 refills | Status: DC

## 2016-10-10 NOTE — Patient Instructions (Signed)
We have provided the prescription for the Antibiotic. Please call our office if you have questions or concerns.

## 2016-10-12 NOTE — Progress Notes (Signed)
Patient ID: [name], female   DOB: 1941/06/01, 75 y.o.   MRN: [id number]  HPI [name] is a 75 y.o. female seen in consultation for a persistent rectovaginal fistula. 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 She currently lives in a nursing facility.  Her dementia has declined significantly over the last year and a half after apparently she had a fall with some injury to lower extremities. She is accompanied by her son who is the power of attorney and was making decisions for her. She has significant dementia and although she knows her name she is unable to make any complex decisions. As per son and they noted about 3 months ago some feces, may not out of her vagina. I have personally reviewed his CT scan and there is evidence of communication between the distal sigmoid colon and proximal rectum to the vagina. At she was already seen by gynecology and was referred to colorectal surgery acute. I have reviewed there notes and they have recommended nonoperative intervention given her comorbidities. They are here for a second opinion. She does have recurrent UTIs and and she has no control of the sphincters and wears diapers No evidence of sepsis or other complications from her fistula.  HPI  Past Medical History:  Diagnosis Date  . A-fib ([place]) 02/14/2015  . Arthritis   . Atrial fibrillation ([place])   . Complication of anesthesia    DIFFICULTY WAKING   . CVA (cerebral infarction)   . CVA (cerebral infarction) 02/14/2015  . GERD (gastroesophageal reflux disease) 02/14/2015  . GERD without esophagitis 02/14/2015  . Seizure ([place]) 02/14/2015  . Seizures ([place])     Past Surgical History:  Procedure Laterality Date  . FRACTURE SURGERY    . JOINT REPLACEMENT    . TOTAL HIP ARTHROPLASTY      Family History  Problem Relation Age of Onset  . Atrial fibrillation Sister     Social History Social  History  Substance Use Topics  . Smoking status: Never Smoker  . Smokeless tobacco: Never Used  . Alcohol use No    No Known Allergies  Current Outpatient Prescriptions  Medication Sig Dispense Refill  . acetaminophen (TYLENOL) 325 MG tablet Take 325 mg by mouth every 6 (six) hours as needed for moderate pain.    . Amino Acids-Protein Hydrolys (FEEDING SUPPLEMENT, PRO-STAT 64,) LIQD Take 30 mLs by mouth daily.    Marland Kitchen apixaban (ELIQUIS) 5 MG TABS tablet Take 1 tablet (5 mg total) by mouth 2 (two) times daily. 60 tablet 5  . ascorbic acid (VITAMIN C) 500 MG tablet Take 500 mg by mouth daily.    . Cholecalciferol 1000 UNITS capsule Take 1,000 Units by mouth daily.    . diclofenac sodium (VOLTAREN) 1 % GEL Apply 2 g topically 4 (four) times daily.    . divalproex (DEPAKOTE SPRINKLES) 125 MG capsule Take 125 mg by mouth at bedtime.    . donepezil (ARICEPT) 5 MG tablet Take 1 tablet (5 mg total) by mouth at bedtime. (Patient taking differently: Take 15 mg by mouth at bedtime. ) 30 tablet 0  . esomeprazole (NEXIUM) 20 MG capsule Take 20 mg by mouth daily at 12 noon.    Marland Kitchen esomeprazole (NEXIUM) 40 MG capsule Take 40 mg by mouth daily before breakfast.      . furosemide (LASIX) 20 MG tablet Take 20 mg by mouth daily.      Marland Kitchen  HYDROcodone-acetaminophen (NORCO/VICODIN) 5-325 MG per tablet Take 1-2 tablets by mouth every 4 (four) hours as needed for moderate pain. 30 tablet 0  . ketotifen (ZADITOR) 0.025 % ophthalmic solution Place 1 drop into both eyes daily as needed (for dry eyes).    Marland Kitchen levETIRAcetam (KEPPRA) 500 MG tablet Take 500 mg by mouth daily.     Marland Kitchen LORazepam (ATIVAN) 1 MG tablet Take 1 tablet (1 mg total) by mouth every 8 (eight) hours as needed for anxiety or sleep. 30 tablet 0  . memantine (NAMENDA) 10 MG tablet Take 10 mg by mouth 2 (two) times daily.    . metoprolol (LOPRESSOR) 50 MG tablet Take 50 mg by mouth daily.      . niacin 500 MG tablet Take 500 mg by mouth daily with breakfast.       . polyethylene glycol (MIRALAX) packet Take 17 g by mouth daily. 14 each 0  . potassium chloride (KLOR-CON) 10 MEQ CR tablet Take 10 mEq by mouth daily.      . QUEtiapine (SEROQUEL) 50 MG tablet Take 1 tablet (50 mg total) by mouth at bedtime. 30 tablet 2  . simvastatin (ZOCOR) 20 MG tablet Take 20 mg by mouth daily.    . simvastatin (ZOCOR) 40 MG tablet Take 40 mg by mouth at bedtime.      . sulfamethoxazole-trimethoprim (BACTRIM DS,SEPTRA DS) 800-160 MG tablet Take 1 tablet by mouth 2 (two) times daily.    Marland Kitchen sulfamethoxazole-trimethoprim (BACTRIM DS,SEPTRA DS) 800-160 MG tablet Take 1 tablet by mouth 2 (two) times daily. 28 tablet 1   No current facility-administered medications for this visit.      Review of Systems A 10 point review of systems was asked and was negative except for the information on the HPI  Physical Exam Blood pressure 133/88, pulse 77, temperature 98 F (36.7 C), temperature source Oral. CONSTITUTIONAL: Debilitated elderly female EYES: Pupils are equal, round, and reactive to light, Sclera are non-icteric. EARS, NOSE, MOUTH AND THROAT: The oropharynx is clear. The oral mucosa is pink and moist. Hearing is intact to voice. LYMPH NODES:  Lymph nodes in the neck are normal. RESPIRATORY:  Lungs are clear. There is normal respiratory effort, with equal breath sounds bilaterally, and without pathologic use of accessory muscles. CARDIOVASCULAR: Heart is regular without murmurs, gallops, or rubs. GI: The abdomen is  soft, nontender, and nondistended. There are no palpable masses. There is no hepatosplenomegaly. There are normal bowel sounds in all quadrants. GU:  Vagina: some brown fluid, no obvious fistula, examination is very limited secondary to decrease mobility. No obvious masses Rectal: empty vault, no masses MUSCULOSKELETAL: Normal muscle strength and tone. No cyanosis or edema.   SKIN: Turgor is good and there are no pathologic skin lesions or ulcers. NEUROLOGIC:  Motor and sensation is grossly normal. Cranial nerves are grossly intact. PSYCH:  Oriented to person, place and time. Affect is normal.  Data Reviewed  I have personally reviewed the patient's imaging, laboratory findings and medical records.    Assessment/ Plan Colovaginal fistula and elderly and debilitated female with dementia, history of a stroke on anticoagulation and progressive failure to thrive. I do not recommend any surgical interventions. she is a very poor operative candidate and the risk outweighed the benefit of surgery. I would not think that she will do well with the coloproctectomy and a prolonged hospitalization. I discussed with the son in detail who understands and agrees with me. For now we will encourage symptomatic management and  antibiotic therapy when UTIs will flareup apparently today she is having more foul-smelling urine and we will prescribe a short course of Bactrim.  Surgical intervention indicated at this time. Follow-up when necessary extensive counseling provided to the family.   Caroleen Hamman, MD FACS General Surgeon 10/12/2016, 8:11 PM

## 2016-10-23 ENCOUNTER — Emergency Department
Admission: EM | Admit: 2016-10-23 | Discharge: 2016-10-24 | Disposition: A | Discharge status: Home / Self Care | Payer: Medicare Other | Attending: Emergency Medicine | Admitting: Emergency Medicine

## 2016-10-23 ENCOUNTER — Emergency Department: Payer: Medicare Other

## 2016-10-23 ENCOUNTER — Encounter: Payer: Self-pay | Admitting: Emergency Medicine

## 2016-10-23 DIAGNOSIS — S8991XA Unspecified injury of right lower leg, initial encounter: Secondary | ICD-10-CM | POA: Diagnosis present

## 2016-10-23 DIAGNOSIS — W010XXA Fall on same level from slipping, tripping and stumbling without subsequent striking against object, initial encounter: Secondary | ICD-10-CM | POA: Diagnosis not present

## 2016-10-23 DIAGNOSIS — Y92121 Bathroom in nursing home as the place of occurrence of the external cause: Secondary | ICD-10-CM | POA: Insufficient documentation

## 2016-10-23 DIAGNOSIS — S72401A Unspecified fracture of lower end of right femur, initial encounter for closed fracture: Secondary | ICD-10-CM | POA: Insufficient documentation

## 2016-10-23 DIAGNOSIS — Z79899 Other long term (current) drug therapy: Secondary | ICD-10-CM | POA: Insufficient documentation

## 2016-10-23 DIAGNOSIS — Y999 Unspecified external cause status: Secondary | ICD-10-CM | POA: Insufficient documentation

## 2016-10-23 DIAGNOSIS — Z792 Long term (current) use of antibiotics: Secondary | ICD-10-CM | POA: Diagnosis not present

## 2016-10-23 DIAGNOSIS — Y9389 Activity, other specified: Secondary | ICD-10-CM | POA: Insufficient documentation

## 2016-10-23 DIAGNOSIS — R05 Cough: Secondary | ICD-10-CM | POA: Insufficient documentation

## 2016-10-23 HISTORY — DX: Unspecified dementia, unspecified severity, without behavioral disturbance, psychotic disturbance, mood disturbance, and anxiety: F03.90

## 2016-10-23 LAB — GLUCOSE, CAPILLARY: Glucose-Capillary: 103 mg/dL — ABNORMAL HIGH (ref 65–99)

## 2016-10-23 MED ORDER — ACETAMINOPHEN 500 MG PO TABS: 1000.0000 mg | ORAL_TABLET | Freq: Three times a day (TID) | ORAL | 2 refills | Status: DC

## 2016-10-23 MED ORDER — ACETAMINOPHEN 500 MG PO TABS
1000.0000 mg | ORAL_TABLET | Freq: Once | ORAL | Status: AC
  Administered 2016-10-23: 1000 mg via ORAL
  Filled 2016-10-23: qty 2

## 2016-10-23 MED ORDER — FENTANYL CITRATE (PF) 100 MCG/2ML IJ SOLN
25.0000 ug | Freq: Once | INTRAMUSCULAR | Status: AC
  Administered 2016-10-23: 25 ug via INTRAVENOUS
  Filled 2016-10-23: qty 2

## 2016-10-23 MED ORDER — OXYCODONE HCL 5 MG PO TABS: 5.0000 mg | ORAL_TABLET | Freq: Four times a day (QID) | ORAL | 0 refills | Status: DC | PRN

## 2016-10-23 MED ORDER — SENNOSIDES-DOCUSATE SODIUM 8.6-50 MG PO TABS: 1.0000 | ORAL_TABLET | Freq: Every day | ORAL | 1 refills | Status: AC

## 2016-10-23 MED ORDER — OXYCODONE HCL 5 MG PO TABS
5.0000 mg | ORAL_TABLET | Freq: Once | ORAL | Status: AC
  Administered 2016-10-23: 5 mg via ORAL
  Filled 2016-10-23: qty 1

## 2016-10-23 MED ORDER — SODIUM CHLORIDE 0.9 % IV BOLUS (SEPSIS)
500.0000 mL | Freq: Once | INTRAVENOUS | Status: AC
  Administered 2016-10-23: 500 mL via INTRAVENOUS

## 2016-10-23 MED ORDER — SODIUM CHLORIDE 0.9 % IV BOLUS (SEPSIS)
1000.0000 mL | Freq: Once | INTRAVENOUS | Status: AC
  Administered 2016-10-23: 1000 mL via INTRAVENOUS

## 2016-10-23 NOTE — ED Provider Notes (Signed)
[facility] Emergency Department Provider Note  ____________________________________________  Time seen: Approximately 1:39 PM  I have reviewed the triage vital signs and the nursing notes.   HISTORY  Chief Complaint Leg Pain  Level 5 caveat:  Portions of the history and physical were unable to be obtained due to dementia and slurred speech   HPI [name] is a 75 y.o. female with h/o CVA, dementia, afib on Eliquis, wheelchair bound who presents for evaluation of R leg pain. Per report from skilled nursing facility, patient was in a standing lift in the bathroom when she started to slip off the lift and according to the facility they lowered her to the ground. Since then patient has been complain of pain in R knee and thigh.  Patient points to her knee and medial aspect of R thigh as places of pain and tells me that the pain is severe. She denies  pain on the left leg, headache, chest pain, shortness of breath, abdominal pain, back pain. Patient has slurred speech, L sided facial droop and L hemiparesis which per son who is at the bedside this is her baseline.  Past Medical History:  Diagnosis Date  . A-fib ([place]) 02/14/2015  . Arthritis   . Atrial fibrillation ([place])   . Complication of anesthesia    DIFFICULTY WAKING   . CVA (cerebral infarction)   . CVA (cerebral infarction) 02/14/2015  . Dementia   . GERD (gastroesophageal reflux disease) 02/14/2015  . GERD without esophagitis 02/14/2015  . Seizure ([place]) 02/14/2015  . Seizures [facility])     Patient Active Problem List   Diagnosis Date Noted  . Allergic state 10/05/2016  . Arthritis 10/05/2016  . Glucose intolerance (impaired glucose tolerance) 10/05/2016  . History of gout 10/05/2016  . Hyperlipidemia, unspecified 10/05/2016  . Stroke ([place]) 10/05/2016  . Delirium due to another medical condition 07/09/2016  . 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 Fracture, tibial plateau 04/01/2015  . Fall   . Knee  fracture, left 03/29/2015  . UTI (lower urinary tract infection) 03/29/2015  . Mental status, decreased 02/14/2015  . A-fib ([place]) 02/14/2015  . CVA (cerebral infarction) 02/14/2015  . GERD (gastroesophageal reflux disease) 02/14/2015  . Seizure ([place]) 02/14/2015  . Dementia 02/14/2015  . GERD without esophagitis 02/14/2015  . History of CVA (cerebrovascular accident)   . Long term current use of anticoagulant therapy 08/08/2011    Past Surgical History:  Procedure Laterality Date  . FRACTURE SURGERY    . JOINT REPLACEMENT    . TOTAL HIP ARTHROPLASTY      Prior to Admission medications   Medication Sig Start Date End Date Taking? Authorizing Provider  acetaminophen (TYLENOL) 500 MG tablet Take 2 tablets (1,000 mg total) by mouth every 8 (eight) hours. 10/23/16 10/23/17  Rudene Re, MD  Amino Acids-Protein Hydrolys (FEEDING SUPPLEMENT, PRO-STAT 64,) LIQD Take 30 mLs by mouth daily.    Historical Provider, MD  apixaban (ELIQUIS) 5 MG TABS tablet Take 1 tablet (5 mg total) by mouth 2 (two) times daily. 04/01/15   Reyne Dumas, MD  ascorbic acid (VITAMIN C) 500 MG tablet Take 500 mg by mouth daily.    Historical Provider, MD  Cholecalciferol 1000 UNITS capsule Take 1,000 Units by mouth daily.    Historical Provider, MD  diclofenac sodium (VOLTAREN) 1 % GEL Apply 2 g topically 4 (four) times daily.    Historical Provider, MD  divalproex (DEPAKOTE SPRINKLES) 125 MG capsule Take 125 mg by mouth at  bedtime.    Historical Provider, MD  donepezil (ARICEPT) 5 MG tablet Take 1 tablet (5 mg total) by mouth at bedtime. Patient taking differently: Take 15 mg by mouth at bedtime.  04/01/15   Reyne Dumas, MD  esomeprazole (NEXIUM) 20 MG capsule Take 20 mg by mouth daily at 12 noon.    Historical Provider, MD  esomeprazole (NEXIUM) 40 MG capsule Take 40 mg by mouth daily before breakfast.      Historical Provider, MD  furosemide (LASIX) 20 MG tablet Take 20 mg by mouth daily.      Historical  Provider, MD  HYDROcodone-acetaminophen (NORCO/VICODIN) 5-325 MG per tablet Take 1-2 tablets by mouth every 4 (four) hours as needed for moderate pain. 04/01/15   Reyne Dumas, MD  ketotifen (ZADITOR) 0.025 % ophthalmic solution Place 1 drop into both eyes daily as needed (for dry eyes).    Historical Provider, MD  levETIRAcetam (KEPPRA) 500 MG tablet Take 500 mg by mouth daily.     Historical Provider, MD  LORazepam (ATIVAN) 1 MG tablet Take 1 tablet (1 mg total) by mouth every 8 (eight) hours as needed for anxiety or sleep. 04/01/15   Reyne Dumas, MD  memantine (NAMENDA) 10 MG tablet Take 10 mg by mouth 2 (two) times daily.    Historical Provider, MD  metoprolol (LOPRESSOR) 50 MG tablet Take 50 mg by mouth daily.      Historical Provider, MD  niacin 500 MG tablet Take 500 mg by mouth daily with breakfast.      Historical Provider, MD  oxyCODONE (ROXICODONE) 5 MG immediate release tablet Take 1 tablet (5 mg total) by mouth every 6 (six) hours as needed for moderate pain or severe pain. 10/23/16 10/23/17  Rudene Re, MD  polyethylene glycol [facility]) packet Take 17 g by mouth daily. 04/01/15   Reyne Dumas, MD  potassium chloride (KLOR-CON) 10 MEQ CR tablet Take 10 mEq by mouth daily.      Historical Provider, MD  QUEtiapine (SEROQUEL) 50 MG tablet Take 1 tablet (50 mg total) by mouth at bedtime. 04/01/15   Reyne Dumas, MD  senna-docusate (SENOKOT S) 8.6-50 MG tablet Take 1 tablet by mouth daily. 10/23/16   Rudene Re, MD  simvastatin (ZOCOR) 20 MG tablet Take 20 mg by mouth daily.    Historical Provider, MD  simvastatin (ZOCOR) 40 MG tablet Take 40 mg by mouth at bedtime.      Historical Provider, MD  sulfamethoxazole-trimethoprim (BACTRIM DS,SEPTRA DS) 800-160 MG tablet Take 1 tablet by mouth 2 (two) times daily.    Historical Provider, MD  sulfamethoxazole-trimethoprim (BACTRIM DS,SEPTRA DS) 800-160 MG tablet Take 1 tablet by mouth 2 (two) times daily. 10/10/16   [place], MD     Allergies Patient has no known allergies.  Family History  Problem Relation Age of Onset  . Atrial fibrillation Sister     Social History Social History  Substance Use Topics  . Smoking status: Never Smoker  . Smokeless tobacco: Never Used  . Alcohol use No    Review of Systems  Constitutional: Negative for fever. Eyes: Negative for visual changes. ENT: Negative for sore throat. Neck: No neck pain  Cardiovascular: Negative for chest pain. Respiratory: Negative for shortness of breath. Gastrointestinal: Negative for abdominal pain, vomiting or diarrhea. Genitourinary: Negative for dysuria. Musculoskeletal: Negative for back pain. + R leg pain Skin: Negative for rash. Neurological: Negative for headaches, weakness or numbness. Psych: No SI or HI  ____________________________________________   PHYSICAL EXAM:  VITAL SIGNS: ED Triage Vitals  Enc Vitals Group     BP 10/23/16 1311 113/86     Pulse Rate 10/23/16 1311 92     Resp 10/23/16 1311 18     Temp 10/23/16 1311 98 F (36.7 C)     Temp Source 10/23/16 1311 Oral     SpO2 10/23/16 1316 100 %     Weight 10/23/16 1314 217 lb (98.4 kg)     Height --      Head Circumference --      Peak Flow --      Pain Score --      Pain Loc --      Pain Edu? --      Excl. in [place]? --     Constitutional: Alert, oriented to self, slurred speech, no distress HEENT:      Head: Normocephalic and atraumatic.         Eyes: Conjunctivae are normal. Sclera is non-icteric. EOMI. PERRL      Mouth/Throat: Mucous membranes are moist.       Neck: Supple with no signs of meningismus. Cardiovascular: Regular rate and rhythm. No murmurs, gallops, or rubs. 2+ symmetrical distal pulses are present in all extremities. No JVD. Respiratory: Normal respiratory effort. Lungs are clear to auscultation bilaterally. No wheezes, crackles, or rhonchi.  Gastrointestinal: Soft, non tender, and non distended with positive bowel sounds. No rebound or  guarding. Musculoskeletal:R leg is flexed at the knee, externally rotated. Patient has significant pain with minimal movement of the leg, no ttp over the R hip joint, ttp over the mid femur and R knee. Soft compartments. Neurologic: Slurred speech, L sided facial droop and L sided hemiparesis Skin: Skin is warm, dry and intact. No rash noted. Psychiatric: Mood and affect are normal.  ____________________________________________   LABS (all labs ordered are listed, but only abnormal results are displayed)  Labs Reviewed - No data to display ____________________________________________  EKG  ED ECG REPORT I, Rudene Re, the attending physician, personally viewed and interpreted this ECG.  a fibrillation, rate of 115, prolonged QTC, right axis deviation, no ST elevations or depressions. ____________________________________________  RADIOLOGY  XR: Impacted fracture distal femur  CXR and XR R hip: negative ____________________________________________   PROCEDURES  Procedure(s) performed: yes Procedures   SPLINT APPLICATION Date/Time: 123XX123 PM Authorized by: Rudene Re Consent: Verbal consent obtained. Risks and benefits: risks, benefits and alternatives were discussed Consent given by: patient Splint applied by: orthopedic technician Location details: R knee Splint type: fiberglass Supplies used: fiberglass, extra padding Post-procedure: The splinted body part was neurovascularly unchanged following the procedure. Patient tolerance: Patient tolerated the procedure well with no immediate complications.    Critical Care performed:  None ____________________________________________   INITIAL IMPRESSION / ASSESSMENT AND PLAN / ED COURSE  75 y.o. female with h/o CVA, dementia, wheelchair bound who presents for evaluation of R leg pain since earlier today. XR pending. Will give IV fentanyl for the pain.  Clinical Course    Patient found to have a packet  fracture of the distal femur. Dr. Marry Guan was consulted and since patient is wheelchair-bound and has multiple comorbidities he feels that the risks of surgery outweighed the benefits and has recommended the patient be placed in a knee immobilizer or splint with a lot of padding to prevent skin injury, nonweightbearing, pain control and close f/u with him in 7-10 days. Pain well controlled on PO meds. Splint applied. Instructions given to family and written form to the  SNF.  Pertinent labs & imaging results that were available during my care of the patient were reviewed by me and considered in my medical decision making (see chart for details).    ____________________________________________   FINAL CLINICAL IMPRESSION(S) / ED DIAGNOSES  Final diagnoses:  Closed fracture of distal end of right femur, unspecified fracture morphology, initial encounter ([place])      NEW MEDICATIONS STARTED DURING THIS VISIT:  New Prescriptions   ACETAMINOPHEN (TYLENOL) 500 MG TABLET    Take 2 tablets (1,000 mg total) by mouth every 8 (eight) hours.   OXYCODONE (ROXICODONE) 5 MG IMMEDIATE RELEASE TABLET    Take 1 tablet (5 mg total) by mouth every 6 (six) hours as needed for moderate pain or severe pain.   SENNA-DOCUSATE (SENOKOT S) 8.6-50 MG TABLET    Take 1 tablet by mouth daily.     Note:  This document was prepared using Dragon voice recognition software and may include unintentional dictation errors.    Rudene Re, MD 10/23/16 [phone]

## 2016-10-23 NOTE — ED Triage Notes (Signed)
Pt was from [facility]. In lift in bathroom and per report her foot slipped and they helped her to the floor. C/o right leg pain per EMS. When asked pt she c/o pain to both legs.

## 2016-10-23 NOTE — ED Notes (Signed)
Diaper changed, new blanket given. NAD. Waiting on transport. Son went to get food.

## 2016-10-23 NOTE — ED Notes (Addendum)
Staff from [facility] called wanting update. Informed her son wants all updates to go through him.

## 2016-10-23 NOTE — ED Notes (Signed)
Waiting on transport to [facility]. Family at bedside. No needs.

## 2016-10-23 NOTE — ED Notes (Signed)
Also went over discharge papers with son so he is aware of what her care plan is.

## 2016-10-23 NOTE — Discharge Instructions (Signed)
Patient has sustained a fracture of her femur that is not operable. She cannot bear weight on her right leg. She needs to stay on the splint that she is being sent home until clear by orthopedics. She is to follow-up with orthopedics in 7 days. If she is transitioned to a wheelchair her leg needs to be extended at all times.  Pain control: Take tylenol 1000mg  every 8 hours. Take 5mg  of oxycodone every 6 hours for breakthrough pain. If you need the oxycodone make sure to take one senokot as well to prevent constipation.  Do not drink alcohol, drive or participate in any other potentially dangerous activities while taking this medication as it may make you sleepy. Do not take this medication with any other sedating medications, either prescription or over-the-counter.

## 2016-10-23 NOTE — ED Notes (Signed)
Right knee splinted from mid thigh to ankle pulses presnt before and after splinting

## 2016-10-23 NOTE — ED Provider Notes (Signed)
75 y.o. wheelchair bound female with a distal femur fracture that is being conservatively managed. I was called to the room because transport was here to take the patient after she had been stabilized and was ready for transport, but now is hypotensive. The patient has been here many hours and has not had any oral intake, so the likely cause of her hypotension is hypovolemia. I will give her additional intravenous fluid, and she did cough once so I will repeat her chest x-ray.If the patient does not respond to intravenous fle'll be admitted to the hospital for further evaluation and treatment.   Eula Listen, MD 10/23/16 2215

## 2016-10-23 NOTE — ED Notes (Signed)
Pt remains in xray.

## 2016-10-23 NOTE — ED Notes (Signed)
Patient transported to X-ray 

## 2016-10-31 ENCOUNTER — Emergency Department: Payer: Medicare Other

## 2016-10-31 ENCOUNTER — Encounter: Payer: Self-pay | Admitting: Emergency Medicine

## 2016-10-31 ENCOUNTER — Inpatient Hospital Stay
Admission: EM | Admit: 2016-10-31 | Discharge: 2016-11-05 | DRG: 872 | Disposition: A | Discharge status: Skilled Nursing Facility | Payer: Medicare Other | Attending: Internal Medicine | Admitting: Internal Medicine

## 2016-10-31 DIAGNOSIS — M8580 Other specified disorders of bone density and structure, unspecified site: Secondary | ICD-10-CM | POA: Diagnosis present

## 2016-10-31 DIAGNOSIS — I959 Hypotension, unspecified: Secondary | ICD-10-CM | POA: Diagnosis present

## 2016-10-31 DIAGNOSIS — L89152 Pressure ulcer of sacral region, stage 2: Secondary | ICD-10-CM | POA: Diagnosis present

## 2016-10-31 DIAGNOSIS — S72451A Displaced supracondylar fracture without intracondylar extension of lower end of right femur, initial encounter for closed fracture: Secondary | ICD-10-CM | POA: Diagnosis present

## 2016-10-31 DIAGNOSIS — E785 Hyperlipidemia, unspecified: Secondary | ICD-10-CM | POA: Diagnosis present

## 2016-10-31 DIAGNOSIS — I69354 Hemiplegia and hemiparesis following cerebral infarction affecting left non-dominant side: Secondary | ICD-10-CM | POA: Diagnosis not present

## 2016-10-31 DIAGNOSIS — Z7189 Other specified counseling: Secondary | ICD-10-CM

## 2016-10-31 DIAGNOSIS — Z7901 Long term (current) use of anticoagulants: Secondary | ICD-10-CM

## 2016-10-31 DIAGNOSIS — Z792 Long term (current) use of antibiotics: Secondary | ICD-10-CM | POA: Diagnosis not present

## 2016-10-31 DIAGNOSIS — F0390 Unspecified dementia without behavioral disturbance: Secondary | ICD-10-CM | POA: Diagnosis present

## 2016-10-31 DIAGNOSIS — Z79891 Long term (current) use of opiate analgesic: Secondary | ICD-10-CM

## 2016-10-31 DIAGNOSIS — L89222 Pressure ulcer of left hip, stage 2: Secondary | ICD-10-CM | POA: Diagnosis present

## 2016-10-31 DIAGNOSIS — L22 Diaper dermatitis: Secondary | ICD-10-CM | POA: Diagnosis present

## 2016-10-31 DIAGNOSIS — R0602 Shortness of breath: Secondary | ICD-10-CM | POA: Diagnosis not present

## 2016-10-31 DIAGNOSIS — N3000 Acute cystitis without hematuria: Secondary | ICD-10-CM | POA: Diagnosis present

## 2016-10-31 DIAGNOSIS — B9689 Other specified bacterial agents as the cause of diseases classified elsewhere: Secondary | ICD-10-CM | POA: Diagnosis present

## 2016-10-31 DIAGNOSIS — Z993 Dependence on wheelchair: Secondary | ICD-10-CM

## 2016-10-31 DIAGNOSIS — Z515 Encounter for palliative care: Secondary | ICD-10-CM | POA: Diagnosis not present

## 2016-10-31 DIAGNOSIS — Z791 Long term (current) use of non-steroidal anti-inflammatories (NSAID): Secondary | ICD-10-CM | POA: Diagnosis not present

## 2016-10-31 DIAGNOSIS — Z87440 Personal history of urinary (tract) infections: Secondary | ICD-10-CM

## 2016-10-31 DIAGNOSIS — R4182 Altered mental status, unspecified: Secondary | ICD-10-CM | POA: Diagnosis present

## 2016-10-31 DIAGNOSIS — A419 Sepsis, unspecified organism: Principal | ICD-10-CM | POA: Diagnosis present

## 2016-10-31 DIAGNOSIS — K219 Gastro-esophageal reflux disease without esophagitis: Secondary | ICD-10-CM | POA: Diagnosis present

## 2016-10-31 DIAGNOSIS — L89629 Pressure ulcer of left heel, unspecified stage: Secondary | ICD-10-CM | POA: Diagnosis present

## 2016-10-31 DIAGNOSIS — G40909 Epilepsy, unspecified, not intractable, without status epilepticus: Secondary | ICD-10-CM | POA: Diagnosis present

## 2016-10-31 DIAGNOSIS — M25561 Pain in right knee: Secondary | ICD-10-CM | POA: Diagnosis present

## 2016-10-31 DIAGNOSIS — Z66 Do not resuscitate: Secondary | ICD-10-CM | POA: Diagnosis present

## 2016-10-31 DIAGNOSIS — B9620 Unspecified Escherichia coli [E. coli] as the cause of diseases classified elsewhere: Secondary | ICD-10-CM | POA: Diagnosis present

## 2016-10-31 DIAGNOSIS — I4891 Unspecified atrial fibrillation: Secondary | ICD-10-CM | POA: Diagnosis present

## 2016-10-31 DIAGNOSIS — Z79899 Other long term (current) drug therapy: Secondary | ICD-10-CM | POA: Diagnosis not present

## 2016-10-31 DIAGNOSIS — L8990 Pressure ulcer of unspecified site, unspecified stage: Secondary | ICD-10-CM | POA: Insufficient documentation

## 2016-10-31 DIAGNOSIS — R159 Full incontinence of feces: Secondary | ICD-10-CM | POA: Diagnosis present

## 2016-10-31 DIAGNOSIS — R0609 Other forms of dyspnea: Secondary | ICD-10-CM | POA: Diagnosis present

## 2016-10-31 DIAGNOSIS — Z96641 Presence of right artificial hip joint: Secondary | ICD-10-CM | POA: Diagnosis present

## 2016-10-31 HISTORY — DX: Impaired glucose tolerance (oral): R73.02

## 2016-10-31 HISTORY — DX: Hyperlipidemia, unspecified: E78.5

## 2016-10-31 HISTORY — DX: Urinary tract infection, site not specified: N39.0

## 2016-10-31 HISTORY — DX: Other female intestinal-genital tract fistulae: N82.4

## 2016-10-31 HISTORY — DX: Gout, unspecified: M10.9

## 2016-10-31 LAB — CBC WITH DIFFERENTIAL/PLATELET
BASOS ABS: 0 10*3/uL (ref 0–0.1)
BASOS PCT: 0 %
EOS PCT: 0 %
Eosinophils Absolute: 0 10*3/uL (ref 0–0.7)
HCT: 30.7 % — ABNORMAL LOW (ref 35.0–47.0)
Hemoglobin: 10.3 g/dL — ABNORMAL LOW (ref 12.0–16.0)
LYMPHS PCT: 12 %
Lymphs Abs: 1.5 10*3/uL (ref 1.0–3.6)
MCH: 32.6 pg (ref 26.0–34.0)
MCHC: 33.7 g/dL (ref 32.0–36.0)
MCV: 96.7 fL (ref 80.0–100.0)
MONO ABS: 0.9 10*3/uL (ref 0.2–0.9)
Monocytes Relative: 7 %
Neutro Abs: 10.3 10*3/uL — ABNORMAL HIGH (ref 1.4–6.5)
Neutrophils Relative %: 81 %
PLATELETS: 326 10*3/uL (ref 150–440)
RBC: 3.17 MIL/uL — AB (ref 3.80–5.20)
RDW: 14.5 % (ref 11.5–14.5)
WBC: 12.8 10*3/uL — AB (ref 3.6–11.0)

## 2016-10-31 LAB — COMPREHENSIVE METABOLIC PANEL
ALT: 11 U/L — ABNORMAL LOW (ref 14–54)
ANION GAP: 8 (ref 5–15)
AST: 22 U/L (ref 15–41)
Albumin: 2.7 g/dL — ABNORMAL LOW (ref 3.5–5.0)
Alkaline Phosphatase: 55 U/L (ref 38–126)
BILIRUBIN TOTAL: 1.9 mg/dL — AB (ref 0.3–1.2)
BUN: 23 mg/dL — AB (ref 6–20)
CALCIUM: 9 mg/dL (ref 8.9–10.3)
CO2: 20 mmol/L — ABNORMAL LOW (ref 22–32)
Chloride: 109 mmol/L (ref 101–111)
Creatinine, Ser: 0.61 mg/dL (ref 0.44–1.00)
GFR calc Af Amer: >60 mL/min (ref >60)
Glucose, Bld: 128 mg/dL — ABNORMAL HIGH (ref 65–99)
POTASSIUM: 4.2 mmol/L (ref 3.5–5.1)
Sodium: 137 mmol/L (ref 135–145)
TOTAL PROTEIN: 6.5 g/dL (ref 6.5–8.1)

## 2016-10-31 LAB — LACTIC ACID, PLASMA
Lactic Acid, Venous: 2 mmol/L (ref 0.5–1.9)
Lactic Acid, Venous: 2.3 mmol/L (ref 0.5–1.9)
Lactic Acid, Venous: 2.4 mmol/L (ref 0.5–1.9)

## 2016-10-31 LAB — URINALYSIS, COMPLETE (UACMP) WITH MICROSCOPIC
BILIRUBIN URINE: NEGATIVE
GLUCOSE, UA: NEGATIVE mg/dL
KETONES UR: 5 mg/dL — AB
LEUKOCYTES UA: NEGATIVE
NITRITE: NEGATIVE
PROTEIN: 30 mg/dL — AB
Specific Gravity, Urine: 1.021 (ref 1.005–1.030)
pH: 6 (ref 5.0–8.0)

## 2016-10-31 LAB — MRSA PCR SCREENING: MRSA by PCR: NEGATIVE

## 2016-10-31 MED ORDER — ONDANSETRON HCL 4 MG PO TABS: 4.0000 mg | ORAL_TABLET | Freq: Four times a day (QID) | ORAL | Status: DC | PRN

## 2016-10-31 MED ORDER — ACETAMINOPHEN 650 MG RE SUPP: 650.0000 mg | Freq: Four times a day (QID) | RECTAL | Status: DC | PRN

## 2016-10-31 MED ORDER — VITAMIN D 1000 UNITS PO TABS
1000.0000 [IU] | ORAL_TABLET | Freq: Every day | ORAL | Status: DC
  Administered 2016-10-31 – 2016-11-02 (×3): 1000 [IU] via ORAL
  Filled 2016-10-31 (×3): qty 1

## 2016-10-31 MED ORDER — KETOTIFEN FUMARATE 0.025 % OP SOLN: 1.0000 [drp] | Freq: Every day | OPHTHALMIC | Status: DC | PRN

## 2016-10-31 MED ORDER — SODIUM CHLORIDE 0.9 % IV BOLUS (SEPSIS)
1000.0000 mL | Freq: Once | INTRAVENOUS | Status: AC
  Administered 2016-10-31: 1000 mL via INTRAVENOUS

## 2016-10-31 MED ORDER — VANCOMYCIN HCL IN DEXTROSE 1-5 GM/200ML-% IV SOLN
1000.0000 mg | Freq: Once | INTRAVENOUS | Status: AC
  Administered 2016-10-31: 1000 mg via INTRAVENOUS
  Filled 2016-10-31: qty 200

## 2016-10-31 MED ORDER — VITAMIN C 500 MG PO TABS
500.0000 mg | ORAL_TABLET | Freq: Every day | ORAL | Status: DC
  Administered 2016-10-31 – 2016-11-05 (×6): 500 mg via ORAL
  Filled 2016-10-31 (×6): qty 1

## 2016-10-31 MED ORDER — CEFTRIAXONE SODIUM-DEXTROSE 1-3.74 GM-% IV SOLR
1.0000 g | INTRAVENOUS | Status: DC
  Administered 2016-10-31 – 2016-11-04 (×5): 1 g via INTRAVENOUS
  Filled 2016-10-31 (×5): qty 50

## 2016-10-31 MED ORDER — GUAIFENESIN 100 MG/5ML PO SYRP
100.0000 mg | ORAL_SOLUTION | Freq: Three times a day (TID) | ORAL | Status: DC | PRN
  Filled 2016-10-31: qty 5

## 2016-10-31 MED ORDER — LEVETIRACETAM 500 MG PO TABS
500.0000 mg | ORAL_TABLET | Freq: Every day | ORAL | Status: DC
  Administered 2016-10-31: 500 mg via ORAL
  Filled 2016-10-31: qty 1

## 2016-10-31 MED ORDER — PIPERACILLIN-TAZOBACTAM 3.375 G IVPB
3.3750 g | Freq: Three times a day (TID) | INTRAVENOUS | Status: DC
  Administered 2016-10-31: 3.375 g via INTRAVENOUS
  Filled 2016-10-31: qty 50

## 2016-10-31 MED ORDER — DIVALPROEX SODIUM 125 MG PO CSDR
125.0000 mg | DELAYED_RELEASE_CAPSULE | Freq: Every day | ORAL | Status: DC
  Administered 2016-10-31 – 2016-11-01 (×2): 125 mg via ORAL
  Filled 2016-10-31 (×3): qty 1

## 2016-10-31 MED ORDER — APIXABAN 5 MG PO TABS
5.0000 mg | ORAL_TABLET | Freq: Two times a day (BID) | ORAL | Status: DC
  Administered 2016-10-31 – 2016-11-05 (×11): 5 mg via ORAL
  Filled 2016-10-31 (×11): qty 1

## 2016-10-31 MED ORDER — SIMVASTATIN 20 MG PO TABS
20.0000 mg | ORAL_TABLET | Freq: Every day | ORAL | Status: DC
  Administered 2016-10-31 – 2016-11-01 (×2): 20 mg via ORAL
  Filled 2016-10-31 (×2): qty 1

## 2016-10-31 MED ORDER — SENNOSIDES-DOCUSATE SODIUM 8.6-50 MG PO TABS
1.0000 | ORAL_TABLET | Freq: Every day | ORAL | Status: DC
  Administered 2016-10-31 – 2016-11-05 (×5): 1 via ORAL
  Filled 2016-10-31 (×4): qty 1

## 2016-10-31 MED ORDER — ONDANSETRON HCL 4 MG/2ML IJ SOLN: 4.0000 mg | Freq: Four times a day (QID) | INTRAMUSCULAR | Status: DC | PRN

## 2016-10-31 MED ORDER — LORAZEPAM 1 MG PO TABS: 1.0000 mg | ORAL_TABLET | Freq: Three times a day (TID) | ORAL | Status: DC | PRN

## 2016-10-31 MED ORDER — ACETAMINOPHEN 325 MG PO TABS
650.0000 mg | ORAL_TABLET | Freq: Four times a day (QID) | ORAL | Status: DC | PRN
  Administered 2016-10-31 – 2016-11-04 (×5): 650 mg via ORAL
  Filled 2016-10-31 (×5): qty 2

## 2016-10-31 MED ORDER — MEMANTINE HCL 10 MG PO TABS
10.0000 mg | ORAL_TABLET | Freq: Two times a day (BID) | ORAL | Status: DC
  Administered 2016-10-31 – 2016-11-05 (×11): 10 mg via ORAL
  Filled 2016-10-31 (×12): qty 1

## 2016-10-31 MED ORDER — SODIUM CHLORIDE 0.9% FLUSH
3.0000 mL | Freq: Two times a day (BID) | INTRAVENOUS | Status: DC
  Administered 2016-10-31 – 2016-11-05 (×11): 3 mL via INTRAVENOUS

## 2016-10-31 MED ORDER — PANTOPRAZOLE SODIUM 40 MG PO TBEC
40.0000 mg | DELAYED_RELEASE_TABLET | Freq: Every day | ORAL | Status: DC
  Administered 2016-10-31 – 2016-11-05 (×6): 40 mg via ORAL
  Filled 2016-10-31 (×6): qty 1

## 2016-10-31 MED ORDER — DONEPEZIL HCL 5 MG PO TABS
10.0000 mg | ORAL_TABLET | Freq: Every day | ORAL | Status: DC
Start: 2016-10-31 — End: 2016-11-05
  Administered 2016-10-31 – 2016-11-04 (×5): 10 mg via ORAL
  Filled 2016-10-31 (×5): qty 2

## 2016-10-31 MED ORDER — OXYCODONE HCL 5 MG PO TABS
5.0000 mg | ORAL_TABLET | Freq: Four times a day (QID) | ORAL | Status: DC | PRN
  Administered 2016-10-31 – 2016-11-01 (×2): 5 mg via ORAL
  Filled 2016-10-31 (×3): qty 1

## 2016-10-31 MED ORDER — PIPERACILLIN-TAZOBACTAM 3.375 G IVPB 30 MIN
3.3750 g | Freq: Once | INTRAVENOUS | Status: DC
Start: 2016-10-31 — End: 2016-10-31

## 2016-10-31 MED ORDER — VANCOMYCIN HCL IN DEXTROSE 750-5 MG/150ML-% IV SOLN
750.0000 mg | Freq: Two times a day (BID) | INTRAVENOUS | Status: DC
  Administered 2016-10-31: 13:00:00 750 mg via INTRAVENOUS
  Filled 2016-10-31 (×3): qty 150

## 2016-10-31 MED ORDER — POLYETHYLENE GLYCOL 3350 17 G PO PACK
17.0000 g | PACK | Freq: Every day | ORAL | Status: DC
  Administered 2016-10-31 – 2016-11-05 (×5): 17 g via ORAL
  Filled 2016-10-31 (×5): qty 1

## 2016-10-31 MED ORDER — PIPERACILLIN-TAZOBACTAM 3.375 G IVPB: 3.3750 g | Freq: Three times a day (TID) | INTRAVENOUS | Status: DC

## 2016-10-31 MED ORDER — NIACIN 500 MG PO TABS
500.0000 mg | ORAL_TABLET | Freq: Every day | ORAL | Status: DC
  Administered 2016-11-01 – 2016-11-02 (×2): 500 mg via ORAL
  Filled 2016-10-31 (×2): qty 1

## 2016-10-31 MED ORDER — VANCOMYCIN HCL IN DEXTROSE 1-5 GM/200ML-% IV SOLN: 1000.0000 mg | Freq: Once | INTRAVENOUS | Status: DC

## 2016-10-31 MED ORDER — SENNOSIDES-DOCUSATE SODIUM 8.6-50 MG PO TABS
1.0000 | ORAL_TABLET | Freq: Every evening | ORAL | Status: DC | PRN
  Administered 2016-11-02: 21:00:00 1 via ORAL
  Filled 2016-10-31 (×2): qty 1

## 2016-10-31 MED ORDER — PIPERACILLIN-TAZOBACTAM 3.375 G IVPB 30 MIN
3.3750 g | Freq: Once | INTRAVENOUS | Status: AC
  Administered 2016-10-31: 3.375 g via INTRAVENOUS
  Filled 2016-10-31: qty 50

## 2016-10-31 MED ORDER — ENSURE ENLIVE PO LIQD
237.0000 mL | Freq: Two times a day (BID) | ORAL | Status: DC
  Administered 2016-11-02 – 2016-11-05 (×8): 237 mL via ORAL

## 2016-10-31 MED ORDER — QUETIAPINE FUMARATE 25 MG PO TABS
50.0000 mg | ORAL_TABLET | Freq: Every day | ORAL | Status: DC
  Administered 2016-10-31 – 2016-11-01 (×2): 50 mg via ORAL
  Filled 2016-10-31 (×2): qty 2

## 2016-10-31 MED ORDER — LEVETIRACETAM 500 MG PO TABS
500.0000 mg | ORAL_TABLET | Freq: Two times a day (BID) | ORAL | Status: DC
Start: 2016-10-31 — End: 2016-11-05
  Administered 2016-10-31 – 2016-11-05 (×10): 500 mg via ORAL
  Filled 2016-10-31 (×10): qty 1

## 2016-10-31 MED ORDER — PRO-STAT 64 PO LIQD
30.0000 mL | Freq: Every day | ORAL | Status: DC
  Administered 2016-11-02 – 2016-11-04 (×3): 30 mL via ORAL
  Filled 2016-10-31 (×6): qty 30

## 2016-10-31 MED ORDER — DONEPEZIL HCL 5 MG PO TABS: 5.0000 mg | ORAL_TABLET | Freq: Every day | ORAL | Status: DC

## 2016-10-31 MED ORDER — MEGESTROL ACETATE 40 MG PO TABS
40.0000 mg | ORAL_TABLET | Freq: Every day | ORAL | Status: DC
  Administered 2016-10-31 – 2016-11-05 (×6): 40 mg via ORAL
  Filled 2016-10-31 (×6): qty 1

## 2016-10-31 MED ORDER — SODIUM CHLORIDE 0.9 % IV SOLN
INTRAVENOUS | Status: DC
  Administered 2016-10-31 – 2016-11-05 (×11): via INTRAVENOUS

## 2016-10-31 NOTE — ED Notes (Signed)
3 family members at bedside

## 2016-10-31 NOTE — Progress Notes (Signed)
[facility] NAME: [name]    MR#:  [id number]  DATE OF BIRTH:  08-05-1941  SUBJECTIVE:  CHIEF COMPLAINT:   Chief Complaint  Patient presents with  . Shortness of Breath  Difficult to assess as she is not communicating much at this time, son at bedside  REVIEW OF SYSTEMS:  Review of Systems  Unable to perform ROS: Dementia   DRUG ALLERGIES:  No Known Allergies VITALS:  Blood pressure 114/69, pulse (!) 115, temperature 97.5 F (36.4 C), temperature source Oral, resp. rate 20, height 5\' 4"  (1.626 m), weight 102.2 kg (225 lb 6.4 oz), SpO2 100 %. PHYSICAL EXAMINATION:  Physical Exam  Constitutional: She is well-developed, well-nourished, and in no distress.  HENT:  Head: Normocephalic and atraumatic.  Eyes: Conjunctivae and EOM are normal. Pupils are equal, round, and reactive to light.  Neck: Normal range of motion. Neck supple. No tracheal deviation present. No thyromegaly present.  Cardiovascular: Normal rate, regular rhythm and normal heart sounds.   Pulmonary/Chest: Effort normal and breath sounds normal. No respiratory distress. She has no wheezes. She exhibits no tenderness.  Abdominal: Soft. Bowel sounds are normal. She exhibits no distension. There is no tenderness.  Musculoskeletal:       Right shoulder: She exhibits normal range of motion and no tenderness.  Tenderness in the right thigh area  Neurological: She is alert. No cranial nerve deficit.  Difficult to assess as she is demented  Skin: Skin is warm and dry. No rash noted.  Psychiatric: Mood and affect normal.   LABORATORY PANEL:   CBC  Recent Labs Lab 10/31/16 0342  WBC 12.8*  HGB 10.3*  HCT 30.7*  PLT 326   ------------------------------------------------------------------------------------------------------------------ Chemistries   Recent Labs Lab 10/31/16 0342  NA 137  K 4.2  CL 109  CO2 20*  GLUCOSE 128*  BUN 23*  CREATININE  0.61  CALCIUM 9.0  AST 22  ALT 11*  ALKPHOS 55  BILITOT 1.9*   RADIOLOGY:  Dg Chest Portable 1 View  Result Date: 10/31/2016 CLINICAL DATA:  75 y/o  F; cough and fever. EXAM: PORTABLE CHEST 1 VIEW COMPARISON:  10/23/2016 chest radiograph FINDINGS: Stable elevated left hemidiaphragm and basilar atelectasis. No new focal consolidation. Stable enlarged cardiac silhouette. Aortic atherosclerosis with arch calcification. No acute osseous abnormality is evident. No pleural effusion. IMPRESSION: Stable elevated left hemidiaphragm and left basilar atelectasis. Stable cardiomegaly. No new focal consolidation. Electronically Signed   By: Kristine Garbe M.D.   On: 10/31/2016 05:33   ASSESSMENT AND PLAN:  75 year old female patient with history of atrial fibrillation, right femur fracture status post surgery CVA with left-sided weakness, dementia, GERD, seizure disorder presented to the emergency room from [facility] comments facility for fever and weakness.  * Sepsis due to UTI present on admission - Initially started with vancomycin and Zosyn.  We will switch her over to IV Rocephin  * Gram-negative rod UTI - .  Management as above  * Right Impacted fracture distal femur - Status post fall about a week ago.  She was scheduled to see orthopedics tomorrow and family are requesting orthopedic consultation which we will place  4. Dementia - Mental status likely worsened due to sepsis  5. CVA with left-sided weakness - continue eliquis for anticoagulation  Physical therapy Supportive care.     All the records are reviewed and case discussed with Care Management/Social Worker. Management plans discussed with the patient, family and they  are in agreement.  CODE STATUS: FULL CODE  TOTAL TIME TAKING CARE OF THIS PATIENT: 35 minutes.   More than 50% of the time was spent in counseling/coordination of care: YES  POSSIBLE D/C IN 1-2 DAYS, DEPENDING ON CLINICAL CONDITION. AND ORTHO  EVAL   Max Sane M.D on 10/31/2016 at 5:42 PM  Between 7am to 6pm - Pager - [phone]  After 6pm go to [email or web address] - Proofreader  [facility]  Office  [phone]  CC: Primary care physician; Kirk Ruths., MD  Note: This dictation was prepared with Dragon dictation along with smaller phrase technology. Any transcriptional errors that result from this process are unintentional.

## 2016-10-31 NOTE — ED Notes (Signed)
Gave patient a drink of water

## 2016-10-31 NOTE — Progress Notes (Signed)
Pharmacy Antibiotic Note  [name] is a 75 y.o. female admitted on 10/31/2016 with sepsis.  Pharmacy has been consulted for Vancomycin and Zosyn dosing.  Plan: Vancomycin 750 IV every 12 hours.  Goal trough 15-20 mcg/mL. Zosyn 3.375g IV q8h (4 hour infusion). Will check Vancomycin trough level prior to 5th dose.  Weight: 217 lb (98.4 kg)  Temp (24hrs), Avg:101.6 F (38.7 C), Min:101.6 F (38.7 C), Max:101.6 F (38.7 C)   Recent Labs Lab 10/31/16 0342 10/31/16 0413  WBC 12.8*  --   CREATININE 0.61  --   LATICACIDVEN  --  2.0*    Estimated Creatinine Clearance: 68.4 mL/min (by C-G formula based on SCr of 0.61 mg/dL).    No Known Allergies  Antimicrobials this admission: Vancomycin 12/13 >>  Zosyn 12/13 >>   Dose adjustments this admission:  Microbiology results:   Thank you for allowing pharmacy to be a part of this patient's care.  Paulina Fusi, PharmD, BCPS 10/31/2016 7:02 AM

## 2016-10-31 NOTE — Progress Notes (Signed)
Family Meeting Note  Advance Directive:yes  Today a meeting took place with the Patient and son [name] [phone]) at bedside.  Patient is unable to participate due [id number] capacity Dementia   The following clinical team members were present during this meeting:MD and RN  The following were discussed:Patient's diagnosis: , Patient's progosis: < 12 months and Goals for treatment: Full Code  Additional follow-up to be provided: son would like to talk with other family members before changing her to DNR  Time spent during discussion:20 minutes  Max Sane, MD

## 2016-10-31 NOTE — ED Notes (Signed)
Pt's son phone number [phone]

## 2016-10-31 NOTE — ED Provider Notes (Signed)
[facility] Emergency Department Provider Note    First MD Initiated Contact with Patient 10/31/16 [phone]     (approximate)  I have reviewed the triage vital signs and the nursing notes.  History Limited secondary to altered mental status HISTORY  Chief Complaint Shortness of Breath    HPI [name] is a 75 y.o. female with below list of chronic medical conditions presents with dyspnea and desaturation per EMS from [facility] facility. Patient's son states that his mother has had very poor by mouth intake over the past few days. Patient noted to be febrile on presentation tachycardic and hypotensive and a such concern for sepsis   Past Medical History:  Diagnosis Date  . A-fib ([place]) 02/14/2015  . Arthritis   . Atrial fibrillation ([place])   . Complication of anesthesia    DIFFICULTY WAKING   . CVA (cerebral infarction)   . CVA (cerebral infarction) 02/14/2015  . Dementia   . GERD (gastroesophageal reflux disease) 02/14/2015  . GERD without esophagitis 02/14/2015  . Seizure ([place]) 02/14/2015  . Seizures [facility])     Patient Active Problem List   Diagnosis Date Noted  . Allergic state 10/05/2016  . Arthritis 10/05/2016  . Glucose intolerance (impaired glucose tolerance) 10/05/2016  . History of gout 10/05/2016  . Hyperlipidemia, unspecified 10/05/2016  . Stroke ([place]) 10/05/2016  . Delirium due to another medical condition 07/09/2016  . 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 Fracture, tibial plateau 04/01/2015  . Fall   . Knee fracture, left 03/29/2015  . UTI (lower urinary tract infection) 03/29/2015  . Mental status, decreased 02/14/2015  . A-fib ([place]) 02/14/2015  . CVA (cerebral infarction) 02/14/2015  . GERD (gastroesophageal reflux disease) 02/14/2015  . Seizure ([place]) 02/14/2015  . Dementia 02/14/2015  . GERD without esophagitis 02/14/2015  . History of CVA (cerebrovascular accident)   . Long term current use of anticoagulant  therapy 08/08/2011    Past Surgical History:  Procedure Laterality Date  . FRACTURE SURGERY    . JOINT REPLACEMENT    . TOTAL HIP ARTHROPLASTY      Prior to Admission medications   Medication Sig Start Date End Date Taking? Authorizing Provider  acetaminophen (TYLENOL) 500 MG tablet Take 2 tablets (1,000 mg total) by mouth every 8 (eight) hours. 10/23/16 10/23/17  Rudene Re, MD  Amino Acids-Protein Hydrolys (FEEDING SUPPLEMENT, PRO-STAT 64,) LIQD Take 30 mLs by mouth daily.    Historical Provider, MD  apixaban (ELIQUIS) 5 MG TABS tablet Take 1 tablet (5 mg total) by mouth 2 (two) times daily. 04/01/15   Reyne Dumas, MD  ascorbic acid (VITAMIN C) 500 MG tablet Take 500 mg by mouth daily.    Historical Provider, MD  Cholecalciferol 1000 UNITS capsule Take 1,000 Units by mouth daily.    Historical Provider, MD  diclofenac sodium (VOLTAREN) 1 % GEL Apply 2 g topically 4 (four) times daily.    Historical Provider, MD  divalproex (DEPAKOTE SPRINKLES) 125 MG capsule Take 125 mg by mouth at bedtime.    Historical Provider, MD  donepezil (ARICEPT) 5 MG tablet Take 1 tablet (5 mg total) by mouth at bedtime. Patient taking differently: Take 15 mg by mouth at bedtime.  04/01/15   Reyne Dumas, MD  esomeprazole (NEXIUM) 20 MG capsule Take 20 mg by mouth daily at 12 noon.    Historical Provider, MD  esomeprazole (NEXIUM) 40 MG capsule Take 40 mg by mouth daily before breakfast.      Historical  Provider, MD  furosemide (LASIX) 20 MG tablet Take 20 mg by mouth daily.      Historical Provider, MD  HYDROcodone-acetaminophen (NORCO/VICODIN) 5-325 MG per tablet Take 1-2 tablets by mouth every 4 (four) hours as needed for moderate pain. 04/01/15   Reyne Dumas, MD  ketotifen (ZADITOR) 0.025 % ophthalmic solution Place 1 drop into both eyes daily as needed (for dry eyes).    Historical Provider, MD  levETIRAcetam (KEPPRA) 500 MG tablet Take 500 mg by mouth daily.     Historical Provider, MD  LORazepam  (ATIVAN) 1 MG tablet Take 1 tablet (1 mg total) by mouth every 8 (eight) hours as needed for anxiety or sleep. 04/01/15   Reyne Dumas, MD  memantine (NAMENDA) 10 MG tablet Take 10 mg by mouth 2 (two) times daily.    Historical Provider, MD  metoprolol (LOPRESSOR) 50 MG tablet Take 50 mg by mouth daily.      Historical Provider, MD  niacin 500 MG tablet Take 500 mg by mouth daily with breakfast.      Historical Provider, MD  oxyCODONE (ROXICODONE) 5 MG immediate release tablet Take 1 tablet (5 mg total) by mouth every 6 (six) hours as needed for moderate pain or severe pain. 10/23/16 10/23/17  Rudene Re, MD  polyethylene glycol [facility]) packet Take 17 g by mouth daily. 04/01/15   Reyne Dumas, MD  potassium chloride (KLOR-CON) 10 MEQ CR tablet Take 10 mEq by mouth daily.      Historical Provider, MD  QUEtiapine (SEROQUEL) 50 MG tablet Take 1 tablet (50 mg total) by mouth at bedtime. 04/01/15   Reyne Dumas, MD  senna-docusate (SENOKOT S) 8.6-50 MG tablet Take 1 tablet by mouth daily. 10/23/16   Rudene Re, MD  simvastatin (ZOCOR) 20 MG tablet Take 20 mg by mouth daily.    Historical Provider, MD  simvastatin (ZOCOR) 40 MG tablet Take 40 mg by mouth at bedtime.      Historical Provider, MD  sulfamethoxazole-trimethoprim (BACTRIM DS,SEPTRA DS) 800-160 MG tablet Take 1 tablet by mouth 2 (two) times daily.    Historical Provider, MD  sulfamethoxazole-trimethoprim (BACTRIM DS,SEPTRA DS) 800-160 MG tablet Take 1 tablet by mouth 2 (two) times daily. 10/10/16   [place], MD    Allergies Patient has no known allergies.  Family History  Problem Relation Age of Onset  . Atrial fibrillation Sister     Social History Social History  Substance Use Topics  . Smoking status: Never Smoker  . Smokeless tobacco: Never Used  . Alcohol use No    Review of Systems Constitutional: Positive for fever/chills. Poor by mouth intake Eyes: No visual changes. ENT: No sore throat. Cardiovascular:  Denies chest pain. Respiratory: Denies shortness of breath. Gastrointestinal: No abdominal pain.  No nausea, no vomiting.  No diarrhea.  No constipation. Genitourinary: Negative for dysuria. Musculoskeletal: Negative for back pain. Skin: Negative for rash. Neurological: Negative for headaches, focal weakness or numbness.  10-point ROS otherwise negative.  ____________________________________________   PHYSICAL EXAM:  VITAL SIGNS: ED Triage Vitals  Enc Vitals Group     BP 10/31/16 0401 103/65     Pulse Rate 10/31/16 0405 67     Resp 10/31/16 0401 (!) 30     Temp --      Temp src --      SpO2 10/31/16 0405 100 %     Weight 10/31/16 0344 217 lb (98.4 kg)     Height --      Head  Circumference --      Peak Flow --      Pain Score 10/31/16 0345 6     Pain Loc --      Pain Edu? --      Excl. in [place]? --     Constitutional: Alert and nonverbal Eyes: Conjunctivae are normal. PERRL. EOMI. Head: Atraumatic. Mouth/Throat: Mucous membranes are moist.  Oropharynx non-erythematous. Neck: No stridor.   Cardiovascular: Irregularly irregular rhythm tachycardia. Good peripheral circulation. Grossly normal heart sounds. Respiratory: Normal respiratory effort.  No retractions. Bibasilar rhonchi Gastrointestinal: Soft and nontender. No distention.  Musculoskeletal: No lower extremity tenderness nor edema. No gross deformities of extremities. Neurologic:  Normal speech and language. No gross focal neurologic deficits are appreciated.  Skin:  Skin is hot to touch, dry and intact. No rash noted. Psychiatric: Mood and affect are normal. Speech and behavior are normal.  ____________________________________________   LABS (all labs ordered are listed, but only abnormal results are displayed)  Labs Reviewed  COMPREHENSIVE METABOLIC PANEL - Abnormal; Notable for the following:       Result Value   CO2 20 (*)    Glucose, Bld 128 (*)    BUN 23 (*)    Albumin 2.7 (*)    ALT 11 (*)    Total  Bilirubin 1.9 (*)    All other components within normal limits  CBC WITH DIFFERENTIAL/PLATELET - Abnormal; Notable for the following:    WBC 12.8 (*)    RBC 3.17 (*)    Hemoglobin 10.3 (*)    HCT 30.7 (*)    Neutro Abs 10.3 (*)    All other components within normal limits  LACTIC ACID, PLASMA - Abnormal; Notable for the following:    Lactic Acid, Venous 2.0 (*)    All other components within normal limits  URINALYSIS, COMPLETE (UACMP) WITH MICROSCOPIC - Abnormal; Notable for the following:    Color, Urine AMBER (*)    APPearance HAZY (*)    Hgb urine dipstick SMALL (*)    Ketones, ur 5 (*)    Protein, ur 30 (*)    Bacteria, UA MANY (*)    Squamous Epithelial / LPF 0-5 (*)    All other components within normal limits  CULTURE, BLOOD (ROUTINE X 2)  CULTURE, BLOOD (ROUTINE X 2)  URINE CULTURE  LACTIC ACID, PLASMA   ____________________________________________  EKG  ED ECG REPORT I, Morrill N BROWN, the attending physician, personally viewed and interpreted this ECG.   Date: 10/31/2016  EKG Time: 3:42 AM  Rate: 129  Rhythm: Atrial fibrillation with rapid ventricular response  Axis: Normal  Intervals: Normal  ST&T Change: None  ____________________________________________  RADIOLOGY I, [place] N BROWN, personally viewed and evaluated these images (plain radiographs) as part of my medical decision making, as well as reviewing the written report by the radiologist.  Dg Chest Portable 1 View  Result Date: 10/31/2016 CLINICAL DATA:  74 y/o  F; cough and fever. EXAM: PORTABLE CHEST 1 VIEW COMPARISON:  10/23/2016 chest radiograph FINDINGS: Stable elevated left hemidiaphragm and basilar atelectasis. No new focal consolidation. Stable enlarged cardiac silhouette. Aortic atherosclerosis with arch calcification. No acute osseous abnormality is evident. No pleural effusion. IMPRESSION: Stable elevated left hemidiaphragm and left basilar atelectasis. Stable cardiomegaly. No new  focal consolidation. Electronically Signed   By: Kristine Garbe M.D.   On: 10/31/2016 05:33      Procedures   Critical Care performed: CRITICAL CARE Performed by: Gregor Hams   Total critical  care time: 45 minutes  Critical care time was exclusive of separately billable procedures and treating other patients.  Critical care was necessary to treat or prevent imminent or life-threatening deterioration.  Critical care was time spent personally by me on the following activities: development of treatment plan with patient and/or surrogate as well as nursing, discussions with consultants, evaluation of patient's response to treatment, examination of patient, obtaining history from patient or surrogate, ordering and performing treatments and interventions, ordering and review of laboratory studies, ordering and review of radiographic studies, pulse oximetry and re-evaluation of patient's condition. ____________________________________________   INITIAL IMPRESSION / ASSESSMENT AND PLAN / ED COURSE  Pertinent labs & imaging results that were available during my care of the patient were reviewed by me and considered in my medical decision making (see chart for details).  History of physical exam consistent with sepsis most likely etiology urinary tract infection is evident by urinalysis. Given the aforementioned concern patient received 30 ML's per kilogram of IV normal saline as well as respiratory antibiotics IV Vancomycin and Zosyn.   Clinical Course     ____________________________________________  FINAL CLINICAL IMPRESSION(S) / ED DIAGNOSES  Final diagnoses:  Sepsis, due to unspecified organism ([place])  Acute cystitis without hematuria     MEDICATIONS GIVEN DURING THIS VISIT:  Medications  vancomycin (VANCOCIN) IVPB 1000 mg/200 mL premix (1,000 mg Intravenous New Bag/Given 10/31/16 0459)  sodium chloride 0.9 % bolus 1,000 mL (1,000 mLs Intravenous New Bag/Given  10/31/16 0433)    And  sodium chloride 0.9 % bolus 1,000 mL (1,000 mLs Intravenous New Bag/Given 10/31/16 0459)    And  sodium chloride 0.9 % bolus 1,000 mL (0 mLs Intravenous Stopped 10/31/16 0450)  piperacillin-tazobactam (ZOSYN) IVPB 3.375 g (0 g Intravenous Stopped 10/31/16 0458)     NEW OUTPATIENT MEDICATIONS STARTED DURING THIS VISIT:  New Prescriptions   No medications on file    Modified Medications   No medications on file    Discontinued Medications   No medications on file     Note:  This document was prepared using Dragon voice recognition software and may include unintentional dictation errors.    Gregor Hams, MD 10/31/16 [phone]

## 2016-10-31 NOTE — Progress Notes (Signed)
Pharmacy Antibiotic Note  [name] is a 75 y.o. female admitted on 10/31/2016 with sepsis.  Pharmacy has been consulted for Vancomycin and Zosyn dosing.  12/13- Vancomycin and Zosyn discontinued  Plan: Change to Ceftriaxone 1 gram IV q24h for possible UTI.    Height: 5\' 4"  (162.6 cm) Weight: 225 lb 6.4 oz (102.2 kg) IBW/kg (Calculated) : 54.7  Temp (24hrs), Avg:99 F (37.2 C), Min:97.5 F (36.4 C), Max:101.6 F (38.7 C)   Recent Labs Lab 10/31/16 0342 10/31/16 0413 10/31/16 0724 10/31/16 1224  WBC 12.8*  --   --   --   CREATININE 0.61  --   --   --   LATICACIDVEN  --  2.0* 2.3* 2.4*    Estimated Creatinine Clearance: 68.5 mL/min (by C-G formula based on SCr of 0.61 mg/dL).    No Known Allergies  Antimicrobials this admission: Vancomycin 12/13 >> 12/13 Zosyn 12/13 >>  12/13    Thank you for allowing pharmacy to be a part of this patient's care.  Chinita Greenland PharmD Clinical Pharmacist 10/31/2016

## 2016-10-31 NOTE — ED Triage Notes (Signed)
Pt presents to ED via [facility] with reported c/o SOB/desaturation. Pt from [facility].

## 2016-10-31 NOTE — H&P (Addendum)
[facility] [place] NAME: [name]    MR#:  [id number]  DATE OF BIRTH:  09-Sep-1941  DATE OF ADMISSION:  10/31/2016  PRIMARY CARE PHYSICIAN: Kirk Ruths., MD   REQUESTING/REFERRING PHYSICIAN:   CHIEF COMPLAINT:   Chief Complaint  Patient presents with  . Shortness of Breath    HISTORY OF PRESENT ILLNESS: [name]  is a 75 y.o. female with a known history of Atrial fibrillation on antecolic patient with oral eliquis,, CVA, dementia, GERD, seizure disorder presented to the emergency room from [facility] facility for difficulty breathing and fever. Patient had fever of 101F. She complains of dysuria. [facility] is [facility] and is getting rehabilitation therapy for femur fracture on the right side which has been operated. Patient is not a great historian. No complaints of any chest pain. Has exertional dyspnea. Workup in the emergency room showed elevated lactate level. Patient was also hypotensive and was given 3 L of IV fluids based on sepsis protocol. Code sepsis was called in the emergency room. Hospitalist service was consulted for further care of the patient.  PAST MEDICAL HISTORY:   Past Medical History:  Diagnosis Date  . A-fib ([place]) 02/14/2015  . Arthritis   . Atrial fibrillation ([place])   . Complication of anesthesia    DIFFICULTY WAKING   . CVA (cerebral infarction)   . CVA (cerebral infarction) 02/14/2015  . Dementia   . GERD (gastroesophageal reflux disease) 02/14/2015  . GERD without esophagitis 02/14/2015  . Seizure ([place]) 02/14/2015  . Seizures ([place])     PAST SURGICAL HISTORY: Past Surgical History:  Procedure Laterality Date  . FRACTURE SURGERY    . JOINT REPLACEMENT    . TOTAL HIP ARTHROPLASTY      SOCIAL HISTORY:  Social History  Substance Use Topics  . Smoking status: Never Smoker  . Smokeless tobacco: Never Used  . Alcohol use No    FAMILY HISTORY:  Family  History  Problem Relation Age of Onset  . Atrial fibrillation Sister     DRUG ALLERGIES: No Known Allergies  REVIEW OF SYSTEMS:   CONSTITUTIONAL: Has fever, fatigue and weakness.  EYES: No blurred or double vision.  EARS, NOSE, AND THROAT: No tinnitus or ear pain.  RESPIRATORY: No cough, has shortness of breath,no wheezing or hemoptysis.  CARDIOVASCULAR: No chest pain, orthopnea, edema.  GASTROINTESTINAL: No nausea, vomiting, diarrhea or abdominal pain.  GENITOURINARY: Has dysuria, no hematuria.  ENDOCRINE: No polyuria, nocturia,  HEMATOLOGY: No anemia, easy bruising or bleeding SKIN: No rash or lesion. MUSCULOSKELETAL: right thigh and right knee bandage noted.  NEUROLOGIC: No tingling, numbness, weakness.  PSYCHIATRY: No anxiety or depression.   MEDICATIONS AT HOME:  Prior to Admission medications   Medication Sig Start Date End Date Taking? Authorizing Provider  acetaminophen (TYLENOL) 500 MG tablet Take 2 tablets (1,000 mg total) by mouth every 8 (eight) hours. 10/23/16 10/23/17  Rudene Re, MD  Amino Acids-Protein Hydrolys (FEEDING SUPPLEMENT, PRO-STAT 64,) LIQD Take 30 mLs by mouth daily.    Historical Provider, MD  apixaban (ELIQUIS) 5 MG TABS tablet Take 1 tablet (5 mg total) by mouth 2 (two) times daily. 04/01/15   Reyne Dumas, MD  ascorbic acid (VITAMIN C) 500 MG tablet Take 500 mg by mouth daily.    Historical Provider, MD  Cholecalciferol 1000 UNITS capsule Take 1,000 Units by mouth daily.    Historical Provider, MD  diclofenac sodium (VOLTAREN) 1 %  GEL Apply 2 g topically 4 (four) times daily.    Historical Provider, MD  divalproex (DEPAKOTE SPRINKLES) 125 MG capsule Take 125 mg by mouth at bedtime.    Historical Provider, MD  donepezil (ARICEPT) 5 MG tablet Take 1 tablet (5 mg total) by mouth at bedtime. Patient taking differently: Take 15 mg by mouth at bedtime.  04/01/15   Reyne Dumas, MD  esomeprazole (NEXIUM) 20 MG capsule Take 20 mg by mouth daily at 12 noon.     Historical Provider, MD  esomeprazole (NEXIUM) 40 MG capsule Take 40 mg by mouth daily before breakfast.      Historical Provider, MD  furosemide (LASIX) 20 MG tablet Take 20 mg by mouth daily.      Historical Provider, MD  HYDROcodone-acetaminophen (NORCO/VICODIN) 5-325 MG per tablet Take 1-2 tablets by mouth every 4 (four) hours as needed for moderate pain. 04/01/15   Reyne Dumas, MD  ketotifen (ZADITOR) 0.025 % ophthalmic solution Place 1 drop into both eyes daily as needed (for dry eyes).    Historical Provider, MD  levETIRAcetam (KEPPRA) 500 MG tablet Take 500 mg by mouth daily.     Historical Provider, MD  LORazepam (ATIVAN) 1 MG tablet Take 1 tablet (1 mg total) by mouth every 8 (eight) hours as needed for anxiety or sleep. 04/01/15   Reyne Dumas, MD  memantine (NAMENDA) 10 MG tablet Take 10 mg by mouth 2 (two) times daily.    Historical Provider, MD  metoprolol (LOPRESSOR) 50 MG tablet Take 50 mg by mouth daily.      Historical Provider, MD  niacin 500 MG tablet Take 500 mg by mouth daily with breakfast.      Historical Provider, MD  oxyCODONE (ROXICODONE) 5 MG immediate release tablet Take 1 tablet (5 mg total) by mouth every 6 (six) hours as needed for moderate pain or severe pain. 10/23/16 10/23/17  Rudene Re, MD  polyethylene glycol [facility]) packet Take 17 g by mouth daily. 04/01/15   Reyne Dumas, MD  potassium chloride (KLOR-CON) 10 MEQ CR tablet Take 10 mEq by mouth daily.      Historical Provider, MD  QUEtiapine (SEROQUEL) 50 MG tablet Take 1 tablet (50 mg total) by mouth at bedtime. 04/01/15   Reyne Dumas, MD  senna-docusate (SENOKOT S) 8.6-50 MG tablet Take 1 tablet by mouth daily. 10/23/16   Rudene Re, MD  simvastatin (ZOCOR) 20 MG tablet Take 20 mg by mouth daily.    Historical Provider, MD  simvastatin (ZOCOR) 40 MG tablet Take 40 mg by mouth at bedtime.      Historical Provider, MD  sulfamethoxazole-trimethoprim (BACTRIM DS,SEPTRA DS) 800-160 MG tablet Take 1  tablet by mouth 2 (two) times daily.    Historical Provider, MD  sulfamethoxazole-trimethoprim (BACTRIM DS,SEPTRA DS) 800-160 MG tablet Take 1 tablet by mouth 2 (two) times daily. 10/10/16   Diego F Pabon, MD      PHYSICAL EXAMINATION:   VITAL SIGNS: Blood pressure 90/65, pulse (!) 104, temperature (!) 101.6 F (38.7 C), temperature source Oral, resp. rate 15, weight 98.4 kg (217 lb), SpO2 99 %.  GENERAL:  75 y.o.-year-old patient lying in the bed awake and responds to commands.  EYES: Pupils equal, round, reactive to light and accommodation. No scleral icterus. Extraocular muscles intact.  HEENT: Head atraumatic, normocephalic. Oropharynx dry and nasopharynx clear.  NECK:  Supple, no jugular venous distention. No thyroid enlargement, no tenderness.  LUNGS: Normal breath sounds bilaterally, no wheezing, rales,rhonchi or crepitation. No use  of accessory muscles of respiration.  CARDIOVASCULAR: S1, S2 irregular. No murmurs, rubs, or gallops.  ABDOMEN: Soft, nontender, nondistended. Bowel sounds present. No organomegaly or mass.  EXTREMITIES: No pedal edema, cyanosis, or clubbing.  Right lower extremity bandage noted. NEUROLOGIC: Cranial nerves II through XII are intact. Muscle strength 3/5 in left upper and left lower extremities. Power 5/5 right upper and right lower extremity. Sensation intact. Gait not checked.  PSYCHIATRIC: The patient is alert and oriented x 2  SKIN: No obvious rash, lesion, or ulcer.   LABORATORY PANEL:   CBC  Recent Labs Lab 10/31/16 0342  WBC 12.8*  HGB 10.3*  HCT 30.7*  PLT 326  MCV 96.7  MCH 32.6  MCHC 33.7  RDW 14.5  LYMPHSABS 1.5  MONOABS 0.9  EOSABS 0.0  BASOSABS 0.0   ------------------------------------------------------------------------------------------------------------------  Chemistries   Recent Labs Lab 10/31/16 0342  NA 137  K 4.2  CL 109  CO2 20*  GLUCOSE 128*  BUN 23*  CREATININE 0.61  CALCIUM 9.0  AST 22  ALT 11*   ALKPHOS 55  BILITOT 1.9*   ------------------------------------------------------------------------------------------------------------------ estimated creatinine clearance is 68.4 mL/min (by C-G formula based on SCr of 0.61 mg/dL). ------------------------------------------------------------------------------------------------------------------ No results for input(s): TSH, T4TOTAL, T3FREE, THYROIDAB in the last 72 hours.  Invalid input(s): FREET3   Coagulation profile No results for input(s): INR, PROTIME in the last 168 hours. ------------------------------------------------------------------------------------------------------------------- No results for input(s): DDIMER in the last 72 hours. -------------------------------------------------------------------------------------------------------------------  Cardiac Enzymes No results for input(s): CKMB, TROPONINI, MYOGLOBIN in the last 168 hours.  Invalid input(s): CK ------------------------------------------------------------------------------------------------------------------ Invalid input(s): POCBNP  ---------------------------------------------------------------------------------------------------------------  Urinalysis    Component Value Date/Time   COLORURINE AMBER (A) 10/31/2016 0401   APPEARANCEUR HAZY (A) 10/31/2016 0401   APPEARANCEUR Clear 03/19/2015 0103   LABSPEC 1.021 10/31/2016 0401   LABSPEC 1.024 03/19/2015 0103   PHURINE 6.0 10/31/2016 0401   GLUCOSEU NEGATIVE 10/31/2016 0401   GLUCOSEU Negative 03/19/2015 0103   HGBUR SMALL (A) 10/31/2016 0401   BILIRUBINUR NEGATIVE 10/31/2016 0401   BILIRUBINUR Negative 03/19/2015 0103   KETONESUR 5 (A) 10/31/2016 0401   PROTEINUR 30 (A) 10/31/2016 0401   UROBILINOGEN 2.0 (H) 03/29/2015 0141   NITRITE NEGATIVE 10/31/2016 0401   LEUKOCYTESUR NEGATIVE 10/31/2016 0401   LEUKOCYTESUR Negative 03/19/2015 0103     RADIOLOGY: Dg Chest Portable 1 View  Result  Date: 10/31/2016 CLINICAL DATA:  75 y/o  F; cough and fever. EXAM: PORTABLE CHEST 1 VIEW COMPARISON:  10/23/2016 chest radiograph FINDINGS: Stable elevated left hemidiaphragm and basilar atelectasis. No new focal consolidation. Stable enlarged cardiac silhouette. Aortic atherosclerosis with arch calcification. No acute osseous abnormality is evident. No pleural effusion. IMPRESSION: Stable elevated left hemidiaphragm and left basilar atelectasis. Stable cardiomegaly. No new focal consolidation. Electronically Signed   By: Kristine Garbe M.D.   On: 10/31/2016 05:33    EKG: Orders placed or performed during the hospital encounter of 10/31/16  . EKG 12-Lead  . EKG 12-Lead  . ED EKG 12-Lead  . ED EKG 12-Lead    IMPRESSION AND PLAN: 75 year old female patient with history of atrial fibrillation, right femur fracture status post surgery CVA with left-sided weakness, dementia, GERD, seizure disorder presented to the emergency room from [facility] facility for fever and weakness. Admitting diagnosis 1. Hypotension 2. Sepsis 3. Acute cystitis 4. Dementia 5. CVA with left-sided weakness Treatment plan : Admit patient to stepdown unit IV fluid hydration IV broad-spectrum antibiotics Follow-up cultures Resume eliquis for anticoagulation Physical therapy Supportive care.  All the records are reviewed and case discussed with ED provider. Management plans discussed with the patient, family and they are in agreement.  CODE STATUS:FULL CODE Code Status History    Date Active Date Inactive Code Status Order ID Comments User Context   03/29/2015  6:49 AM 04/01/2015  3:55 PM Full Code [id number]  Lavina Hamman, MD ED   02/14/2015  8:03 PM 02/15/2015  7:24 PM Full Code [id number]  Thurnell Lose, MD Inpatient       TOTAL TIME TAKING CARE OF THIS PATIENT: 53 minutes.    Saundra Shelling M.D on 10/31/2016 at 6:35 AM  Between 7am to 6pm - Pager - [phone]  After 6pm go to  [email or web address] - password [email or web address] [facility]  Office  [phone]  CC: Primary care physician; Kirk Ruths., MD

## 2016-11-01 ENCOUNTER — Encounter: Payer: Self-pay | Admitting: Orthopedic Surgery

## 2016-11-01 LAB — BASIC METABOLIC PANEL
ANION GAP: 6 (ref 5–15)
BUN: 12 mg/dL (ref 6–20)
CO2: 18 mmol/L — ABNORMAL LOW (ref 22–32)
Calcium: 8.3 mg/dL — ABNORMAL LOW (ref 8.9–10.3)
Chloride: 116 mmol/L — ABNORMAL HIGH (ref 101–111)
Creatinine, Ser: 0.38 mg/dL — ABNORMAL LOW (ref 0.44–1.00)
GFR calc Af Amer: >60 mL/min (ref >60)
Glucose, Bld: 106 mg/dL — ABNORMAL HIGH (ref 65–99)
POTASSIUM: 3.5 mmol/L (ref 3.5–5.1)
SODIUM: 140 mmol/L (ref 135–145)

## 2016-11-01 LAB — CBC
HEMATOCRIT: 23 % — AB (ref 35.0–47.0)
HEMOGLOBIN: 7.6 g/dL — AB (ref 12.0–16.0)
MCH: 31.9 pg (ref 26.0–34.0)
MCHC: 33 g/dL (ref 32.0–36.0)
MCV: 96.4 fL (ref 80.0–100.0)
Platelets: 259 10*3/uL (ref 150–440)
RBC: 2.38 MIL/uL — ABNORMAL LOW (ref 3.80–5.20)
RDW: 14.6 % — ABNORMAL HIGH (ref 11.5–14.5)
WBC: 8 10*3/uL (ref 3.6–11.0)

## 2016-11-01 LAB — LACTIC ACID, PLASMA: Lactic Acid, Venous: 2 mmol/L (ref 0.5–1.9)

## 2016-11-01 MED ORDER — ATORVASTATIN CALCIUM 20 MG PO TABS
10.0000 mg | ORAL_TABLET | Freq: Every day | ORAL | Status: DC
  Administered 2016-11-01 – 2016-11-04 (×4): 10 mg via ORAL
  Filled 2016-11-01 (×4): qty 1

## 2016-11-01 MED ORDER — TRAMADOL HCL 50 MG PO TABS
50.0000 mg | ORAL_TABLET | Freq: Four times a day (QID) | ORAL | Status: DC | PRN
  Administered 2016-11-01 – 2016-11-05 (×8): 50 mg via ORAL
  Filled 2016-11-01 (×8): qty 1

## 2016-11-01 MED ORDER — DILTIAZEM HCL 30 MG PO TABS
30.0000 mg | ORAL_TABLET | Freq: Four times a day (QID) | ORAL | Status: DC
  Administered 2016-11-01 – 2016-11-05 (×16): 30 mg via ORAL
  Filled 2016-11-01 (×17): qty 1

## 2016-11-01 MED ORDER — DILTIAZEM HCL 25 MG/5ML IV SOLN
5.0000 mg | Freq: Once | INTRAVENOUS | Status: AC
  Administered 2016-11-01: 13:00:00 5 mg via INTRAVENOUS
  Filled 2016-11-01: qty 5

## 2016-11-01 NOTE — Progress Notes (Signed)
[place] at [place] NAME: [name]    MR#:  [id number]  DATE OF BIRTH:  1941/05/16  SUBJECTIVE:  CHIEF COMPLAINT:   Chief Complaint  Patient presents with  . Shortness of Breath  Difficult to assess as she is not communicating much at this time, son at bedside  REVIEW OF SYSTEMS:  Review of Systems  Unable to perform ROS: Dementia   DRUG ALLERGIES:  No Known Allergies VITALS:  Blood pressure 90/68, pulse (!) 122, temperature 98.5 F (36.9 C), temperature source Oral, resp. rate 18, height 5\' 4"  (1.626 m), weight 102.2 kg (225 lb 6.4 oz), SpO2 100 %. PHYSICAL EXAMINATION:  Physical Exam  Constitutional: She is well-developed, well-nourished, and in no distress.  HENT:  Head: Normocephalic and atraumatic.  Eyes: Conjunctivae and EOM are normal. Pupils are equal, round, and reactive to light.  Neck: Normal range of motion. Neck supple. No tracheal deviation present. No thyromegaly present.  Cardiovascular: Normal rate, regular rhythm and normal heart sounds.   Pulmonary/Chest: Effort normal and breath sounds normal. No respiratory distress. She has no wheezes. She exhibits no tenderness.  Abdominal: Soft. Bowel sounds are normal. She exhibits no distension. There is no tenderness.  Musculoskeletal:       Right shoulder: She exhibits normal range of motion and no tenderness.  Tenderness in the right thigh area  Neurological: She is alert. No cranial nerve deficit.  Difficult to assess as she is demented  Skin: Skin is warm and dry. No rash noted.  Psychiatric: Mood and affect normal.   LABORATORY PANEL:   CBC  Recent Labs Lab 11/01/16 0609  WBC 8.0  HGB 7.6*  HCT 23.0*  PLT 259   ------------------------------------------------------------------------------------------------------------------ Chemistries   Recent Labs Lab 10/31/16 0342 11/01/16 0609  NA 137 140  K 4.2 3.5  CL 109 116*  CO2 20* 18*  GLUCOSE  128* 106*  BUN 23* 12  CREATININE 0.61 0.38*  CALCIUM 9.0 8.3*  AST 22  --   ALT 11*  --   ALKPHOS 55  --   BILITOT 1.9*  --    RADIOLOGY:  No results found. ASSESSMENT AND PLAN:  75 year old female patient with history of atrial fibrillation, right femur fracture status post surgery CVA with left-sided weakness, dementia, GERD, seizure disorder presented to the emergency room from [facility] comments facility for fever and weakness.  * Sepsis due to UTI present on admission - Continue IV Rocephin  * Gram-negative rod UTI - .  Management as above  * Hypotension/rapid a.fib: already on eliquis for anticoagulation - will try Cardizem to control rate, increase rate of IVF - Bladder scan and if enough urine, try in & out cath, if no success can place foley - if doesn't improve, may need transfer to ICU step-down for close monitoring - will check Lactate  * Right supracondylar fracture distal femur - Status post fall about a week ago.  Appreciate Ortho input and assistance - placed knee range of motion brace to the right lower extremity by Dr Marry Guan.  - not a good surgical candidate  4. Dementia - Mental status likely worsened due to sepsis  5. CVA with left-sided weakness - continue eliquis for anticoagulation  Physical therapy Supportive care.  Poor prognosis  All the records are reviewed and case discussed with Care Management/Social Worker. Management plans discussed with the patient, family and they are in agreement.  CODE STATUS: DNR, Palliative care c/s  TOTAL  TIME (Critical Care) TAKING CARE OF THIS PATIENT: 35 minutes.   High risk for cardio-pulmo arrest, multiorgan failure and death  More than 50% of the time was spent in counseling/coordination of care: YES  POSSIBLE D/C IN 1-2 DAYS, DEPENDING ON CLINICAL CONDITION.   Max Sane M.D on 11/01/2016 at 2:06 PM  Between 7am to 6pm - Pager - [phone]  After 6pm go to [email or web address] - [place]  [employer] [facility]  Office  [phone]  CC: Primary care physician; [name]., MD  Note: This dictation was prepared with Dragon dictation along with smaller phrase technology. Any transcriptional errors that result from this process are unintentional.

## 2016-11-01 NOTE — Progress Notes (Signed)
Initial Nutrition Assessment  DOCUMENTATION CODES:   Obesity unspecified  INTERVENTION:  1. Ensure Enlive po BID, each supplement provides 350 kcal and 20 grams of protein 2. Pro-stat 57mL daily per MD, provides 100 calories and 15gm protein  NUTRITION DIAGNOSIS:   Predicted suboptimal nutrient intake related to lethargy/confusion as evidenced by other (see comment) (observation).  GOAL:   Patient will meet greater than or equal to 90% of their needs  MONITOR:   PO intake, I & O's, Labs, Weight trends, Supplement acceptance  REASON FOR ASSESSMENT:   Malnutrition Screening Tool    ASSESSMENT:   [name]  is a 75 y.o. female with a known history of Atrial fibrillation on antecolic patient with oral eliquis,, CVA, dementia, GERD, seizure disorder presented to the emergency room from [facility] facility for difficulty breathing and fever. Patient had fever of 101F  Attempted to speak with pt at bedside - non-verbal. No family available. Ate very little of her food this AM. Documented meal completion 0% thus far NFPE is WNL Obese Monitor PO. Weight is down a severe 25#/10% over 4 months Labs and medications reviewed: Niacin, Vitamin C, Senokot-S, Megace, Vitamin D NS @ 82mL/hr  Diet Order:  Diet Heart Room service appropriate? Yes; Fluid consistency: Thin  Skin:  Wound (see comment) (Stg I to Heel, Stg II to Sacrum)  Last BM:  PTA  Height:   Ht Readings from Last 1 Encounters:  10/31/16 5\' 4"  (1.626 m)    Weight:   Wt Readings from Last 1 Encounters:  10/31/16 225 lb 6.4 oz (102.2 kg)    Ideal Body Weight:  54.54 kg  BMI:  Body mass index is 38.69 kg/m.  Estimated Nutritional Needs:   Kcal:  1537-1850 (MSJ x1-1.2)  Protein:  100-122 gm  Fluid:  >/= 1.5L  EDUCATION NEEDS:   No education needs identified at this time  [name]. Keimora Swartout, MS, RD LDN Inpatient Clinical Dietitian Pager [phone]

## 2016-11-01 NOTE — Progress Notes (Signed)
Notified pharmacy IV Cardizem not loaded in pyxis. Pharmacy to send up soon.

## 2016-11-01 NOTE — Progress Notes (Signed)
Dr. Manuella Ghazi notified patient is moaning. When asked if she is in pain she says "yes". Verbal order read back and verified for Tramadol 50mg  Q6H PRN.   Dr. Manuella Ghazi updated staff that POA/Son, [name], requested patient not receive narcotics or benzos due to causing increased lethargy at this time.

## 2016-11-01 NOTE — Plan of Care (Signed)
Problem: Skin Integrity: Goal: Risk for impaired skin integrity will decrease Outcome: Progressing Turn Q2H. Frequent skin care provided. Float heels. Protective foam on sacrum and bilateral heels.  Problem: Fluid Volume: Goal: Ability to maintain a balanced intake and output will improve Outcome: Not Progressing Poor PO intake continued. Refused supplements today.

## 2016-11-01 NOTE — Consult Note (Signed)
ORTHOPAEDIC CONSULTATION  PATIENT NAME: [name] DOB: [birth year]  MRN: [id number]  REQUESTING PHYSICIAN: Max Sane, MD  Chief Complaint: Right knee pain  HPI: [name] is a 75 y.o. female who complains of  right knee pain. The patient was evaluated at [facility] emergency department on 10/23/2016 for an injury she sustained in a skilled nursing facility. The patient was apparently in a standing lift in the bathroom when she started to slip off the lift and was lowered to the ground by the staff. She has subsequently complaints of right knee and thigh pain and was noted to have a right distal femur fracture. She was apparently placed in a splint in the emergency department. Prior to the injury she was apparently a wheelchair ambulator secondary to residual from a stroke affecting her left side.  Please note, the patient did NOT have any surgical intervention to the right distal femur fracture despite comments documented by the admitting physician.  Past Medical History:  Diagnosis Date  . A-fib ([place]) 02/14/2015  . Arthritis   . Atrial fibrillation ([place])   . Colovaginal fistula   . Complication of anesthesia    DIFFICULTY WAKING   . CVA (cerebral infarction) 02/14/2015  . Dementia   . GERD without esophagitis 02/14/2015  . Glucose intolerance (impaired glucose tolerance)   . Gout   . Hyperlipidemia   . Recurrent UTI   . Seizure ([place]) 02/14/2015   Past Surgical History:  Procedure Laterality Date  . BUNIONECTOMY Right   . CHOLECYSTECTOMY    . Right total hip arthroplasty  1998  . Right total hip revision arthroplasty for a periprosthetic fracture  2007   Social History   Social History  . Marital status: Widowed    Spouse name: N/A  . Number of children: 2  . Years of education: N/A   Social History Main Topics  . Smoking status: Never Smoker  . Smokeless tobacco: Never Used  . Alcohol use No  . Drug use: No  . Sexual activity: No   Other  Topics Concern  . None   Social History Narrative  . None   Family History  Problem Relation Age of Onset  . Atrial fibrillation Sister    No Known Allergies Prior to Admission medications   Medication Sig Start Date End Date Taking? Authorizing Provider  acetaminophen (TYLENOL) 500 MG tablet Take 2 tablets (1,000 mg total) by mouth every 8 (eight) hours. 10/23/16 10/23/17 Yes Rudene Re, MD  apixaban (ELIQUIS) 5 MG TABS tablet Take 1 tablet (5 mg total) by mouth 2 (two) times daily. 04/01/15  Yes Reyne Dumas, MD  ascorbic acid (VITAMIN C) 500 MG tablet Take 500 mg by mouth daily.   Yes Historical Provider, MD  divalproex (DEPAKOTE SPRINKLES) 125 MG capsule Take 125 mg by mouth at bedtime.   Yes Historical Provider, MD  donepezil (ARICEPT) 5 MG tablet Take 1 tablet (5 mg total) by mouth at bedtime. Patient taking differently: Take 10 mg by mouth at bedtime.  04/01/15  Yes Reyne Dumas, MD  esomeprazole (NEXIUM) 20 MG capsule Take 20 mg by mouth daily at 12 noon.   Yes Historical Provider, MD  furosemide (LASIX) 20 MG tablet Take 20 mg by mouth daily.     Yes Historical Provider, MD  guaifenesin (ROBITUSSIN) 100 MG/5ML syrup Take 100 mg by mouth 3 (three) times daily as needed for cough.   Yes Historical Provider, MD  ketotifen (ZADITOR) 0.025 % ophthalmic solution  Place 1 drop into both eyes daily as needed (for dry eyes).   Yes Historical Provider, MD  levETIRAcetam (KEPPRA) 500 MG tablet Take 500 mg by mouth 2 (two) times daily.    Yes Historical Provider, MD  megestrol (MEGACE) 40 MG tablet Take 40 mg by mouth daily.   Yes Historical Provider, MD  memantine (NAMENDA) 10 MG tablet Take 10 mg by mouth 2 (two) times daily.   Yes Historical Provider, MD  metoprolol (LOPRESSOR) 50 MG tablet Take 50 mg by mouth daily.     Yes Historical Provider, MD  oxyCODONE (ROXICODONE) 5 MG immediate release tablet Take 1 tablet (5 mg total) by mouth every 6 (six) hours as needed for moderate pain or  severe pain. 10/23/16 10/23/17 Yes Rudene Re, MD  polyethylene glycol [facility]) packet Take 17 g by mouth daily. 04/01/15  Yes Reyne Dumas, MD  potassium chloride (KLOR-CON) 10 MEQ CR tablet Take 10 mEq by mouth daily.     Yes Historical Provider, MD  QUEtiapine (SEROQUEL) 50 MG tablet Take 1 tablet (50 mg total) by mouth at bedtime. 04/01/15  Yes Reyne Dumas, MD  senna-docusate (SENOKOT S) 8.6-50 MG tablet Take 1 tablet by mouth daily. 10/23/16  Yes Rudene Re, MD  Amino Acids-Protein Hydrolys (FEEDING SUPPLEMENT, PRO-STAT 64,) LIQD Take 30 mLs by mouth daily.    Historical Provider, MD  simvastatin (ZOCOR) 20 MG tablet Take 20 mg by mouth daily.    Historical Provider, MD   Dg Chest Portable 1 View  Result Date: 10/31/2016 CLINICAL DATA:  75 y/o  F; cough and fever. EXAM: PORTABLE CHEST 1 VIEW COMPARISON:  10/23/2016 chest radiograph FINDINGS: Stable elevated left hemidiaphragm and basilar atelectasis. No new focal consolidation. Stable enlarged cardiac silhouette. Aortic atherosclerosis with arch calcification. No acute osseous abnormality is evident. No pleural effusion. IMPRESSION: Stable elevated left hemidiaphragm and left basilar atelectasis. Stable cardiomegaly. No new focal consolidation. Electronically Signed   By: Kristine Garbe M.D.   On: 10/31/2016 05:33    Positive ROS: Unable to obtain review of systems due to her cognitive status.  Physical Exam: General: Lethargic but responds to verbal cues. She mumbles inaudibly. HEENT: Atraumatic and normocephalic. Sclera are clear. Extraocular motion is intact. Oropharynx is clear with moist mucosa. Neck: Supple, nontender, good range of motion.  Lungs: Clear to auscultation bilaterally. Cardiovascular: Irregular rate and rhythm with normal S1 and S2. No murmurs. No gallops or rubs. Pedal pulses are difficult to palpable bilaterally secondary to edema. Homans test is negative bilaterally. No significant pretibial or  ankle edema although there is edema to both feet.. Abdomen: Soft, nontender, and nondistended. Bowel sounds are present. Skin: Protective dressings are in place to the left ankle secondary to breakdown (as per nursing). Neurologic: Lethargic and not oriented.. Sensory function is grossly intact. Motor strength is difficult to assess due to the patient's cognitive status. The patient does have appears to be some spasticity to the left hand and to the left foot and ankle. Lymphatic: No axillary or cervical lymphadenopathy  MUSCULOSKELETAL: Pertinent musculoskeletal exam is focused on the right lower extremity. There is a well-padded splint to the right lower extremity. Unfortunately, the proximal portion the splint just comes to the level of the fracture and appears to be causing some hinging at the fracture site. The splint was removed. The skin is intact. No erythema or ecchymosis is appreciated. No gross knee effusion. The right thigh is extremely large but the tissue is not tight. Poor muscle tone  is appreciated. Some pain is elicited with attempts at movement of the knee.  I reviewed radiographs of the right knee that were obtained at [facility] on 10/23/2016. Severe osteopenia is present. The distal aspect of a long femoral stem and cerclage cables are noted to the femur. There is a supracondylar distal femur fracture present. Some impaction is present.  Assessment: Right supracondylar distal femur fracture  Plan: The splint was discontinued. I placed a knee range of motion brace to the right lower extremity locked in extension. The lower extremities were elevated on pillows so as to remove pressure from the heels. I discussed the patient's status with her nurse and incised the need for special attention to skin care.  I reviewed the radiographs with my partners. We agree that the patient is an extremely poor candidate for any type of surgical intervention. I recommend continuation  of bracing with serial radiographs to assess bony healing.  James P. Holley Bouche M.D.

## 2016-11-01 NOTE — Care Management Important Message (Signed)
Important Message  Patient Details  Name: [name] MRN: [id number] Date of Birth: 1941-02-11   Medicare Important Message Given:  Yes    Shelbie Ammons, RN 11/01/2016, 8:28 AM

## 2016-11-01 NOTE — Progress Notes (Signed)
Family Meeting Note  Advance Directive:yes  Today a meeting took place with the Patient and son [name] [phone]) at bedside.  Patient is unable to participate due [id number] capacity Dementia   The following clinical team members were present during this meeting:MD and RN  The following were discussed:Patient's diagnosis: , Patient's progosis: < 12 months and Goals for treatment: DNR  Additional follow-up to be provided: Palliative care eval while here and likely follow at the facility  Time spent during discussion:20 minutes  Max Sane, MD

## 2016-11-01 NOTE — Progress Notes (Addendum)
Dr. Manuella Ghazi notified pt HR still A Fib 120-150's on telemetry. Cardizem 30 mg PO given at 1025am as ordered. Verbal order read back and verified for Cardizem 5mg  IV once.

## 2016-11-01 NOTE — Progress Notes (Signed)
Dr. Manuella Ghazi notified HR sustaining 130-140's A Fib on telemetry. MD to place appropriate orders. Will continue to closely monitor.

## 2016-11-01 NOTE — Progress Notes (Signed)
Dr. Manuella Ghazi notified patient has not voided today. Bladder scan 315 ml. HR 110-120's A fib on telemetry. Verbal order to do in/out catheter and bladder scan PRN. If patient has urinary retention again verbal order to place foley catheter.

## 2016-11-02 DIAGNOSIS — Z7189 Other specified counseling: Secondary | ICD-10-CM

## 2016-11-02 DIAGNOSIS — A419 Sepsis, unspecified organism: Principal | ICD-10-CM

## 2016-11-02 DIAGNOSIS — Z515 Encounter for palliative care: Secondary | ICD-10-CM

## 2016-11-02 LAB — BASIC METABOLIC PANEL
Anion gap: 5 (ref 5–15)
BUN: 9 mg/dL (ref 6–20)
CALCIUM: 8 mg/dL — AB (ref 8.9–10.3)
CO2: 20 mmol/L — AB (ref 22–32)
CREATININE: 0.31 mg/dL — AB (ref 0.44–1.00)
Chloride: 116 mmol/L — ABNORMAL HIGH (ref 101–111)
GFR calc Af Amer: >60 mL/min (ref >60)
GFR calc non Af Amer: >60 mL/min (ref >60)
GLUCOSE: 79 mg/dL (ref 65–99)
Potassium: 3.3 mmol/L — ABNORMAL LOW (ref 3.5–5.1)
Sodium: 141 mmol/L (ref 135–145)

## 2016-11-02 LAB — CBC
HEMATOCRIT: 24.6 % — AB (ref 35.0–47.0)
Hemoglobin: 8.1 g/dL — ABNORMAL LOW (ref 12.0–16.0)
MCH: 32.2 pg (ref 26.0–34.0)
MCHC: 32.9 g/dL (ref 32.0–36.0)
MCV: 97.8 fL (ref 80.0–100.0)
PLATELETS: 247 10*3/uL (ref 150–440)
RBC: 2.51 MIL/uL — ABNORMAL LOW (ref 3.80–5.20)
RDW: 14.9 % — AB (ref 11.5–14.5)
WBC: 7.9 10*3/uL (ref 3.6–11.0)

## 2016-11-02 MED ORDER — DILTIAZEM HCL 25 MG/5ML IV SOLN
5.0000 mg | Freq: Once | INTRAVENOUS | Status: AC
  Administered 2016-11-02: 5 mg via INTRAVENOUS
  Filled 2016-11-02: qty 5

## 2016-11-02 MED ORDER — SODIUM CHLORIDE 0.9 % IV BOLUS (SEPSIS)
500.0000 mL | Freq: Once | INTRAVENOUS | Status: AC
Start: 2016-11-02 — End: 2016-11-02
  Administered 2016-11-02: 17:00:00 500 mL via INTRAVENOUS

## 2016-11-02 MED ORDER — POTASSIUM CHLORIDE CRYS ER 20 MEQ PO TBCR
40.0000 meq | EXTENDED_RELEASE_TABLET | Freq: Once | ORAL | Status: AC
  Administered 2016-11-02: 15:00:00 40 meq via ORAL
  Filled 2016-11-02: qty 2

## 2016-11-02 MED ORDER — ORAL CARE MOUTH RINSE
15.0000 mL | Freq: Two times a day (BID) | OROMUCOSAL | Status: DC
  Administered 2016-11-02 – 2016-11-05 (×7): 15 mL via OROMUCOSAL

## 2016-11-02 NOTE — Consult Note (Signed)
Consultation Note Date: 11/02/2016   Patient Name: [name]  DOB: 30-Dec-1940  MRN: [id number]  Age / Sex: 75 y.o., female  PCP: Kirk Ruths, MD Referring Physician: Max Sane, MD  Reason for Consultation: Establishing goals of care  HPI/Patient Profile: 74 y.o. female  with past medical history of dementia, seizures, recurrent UTI, gout, GERD, CVA, colovaginal fistula, arthritis, and afib on eliquis admitted on 10/31/2016 with shortness of breath and fever from [facility]. Found to be septic with UTI-started on rocephin. Hypotensive requiring fluid boluses. In afib RVR, receiving Cardizem. Also with right supracondylar distal femur fracture from a fall on 12/5-supportive care with knee range of motion brace in place. Palliative medicine consultation for goals of care.    Clinical Assessment and Goals of Care: This NP met with patient and son [name]) at bedside. Introduced palliative care. Ms. Weissmann is widowed and has two children. [name] is her POA. She lived with him after her stroke, which was 8 or 9 years ago. He has been a big support to her. She has had dementia for about two years. He did not want her to live in a nursing home but had no choice one year ago. Her dementia has progressed and she would wander at night. Her baseline cognition is alert and oriented. She is able to feed herself but wheelchair bound. [name] tells me she fell in the shower a few weeks ago at the facility.   [name] has a good understanding of reason for current hospitalization. He tells me she is more awake today but not yet back to baseline. Discussed the normal trajectory of dementia and how this is an irreversible, progressive disease. Explained my hopefulness that her cognition continues to improve as infection clears but did mention my fear that she has had a decline since the fall and UTI and this may be a new baseline.  [name] states his understanding but hopes this is not the case.   Discussed code status and advanced directives. He tells me his mother was "against a DNR" but him and his brother made her a DNR yesterday. He feels he made the wrong decision. Discussed that resuscitation and life support are not recommended for individuals with serious, life limiting illnesses like dementia and multiple co-morbidities and that the likelihood of her surviving resuscitation is very slim. After our discussion, he feels more confident in his decision with DNR. Also discussed the MOST form. [name] tells me he would never put a PEG tube in his mother. I agreed with his decision and told him this is not recommended in dementia patients and may extend life but would not give her a better quality of life. He states his understanding. [name] is appreciative of the MOST form and will consider completing when he discusses this with his brother. Also provided [name] a [facility] copy. He is ok with palliative services to follow at the nursing facility. Answered questions and offered support.    HCPOA -son [name])   SUMMARY OF RECOMMENDATIONS  DNR/DNI.   Discontinued Seroquel and Depakote sprinkles per POA request. He tells me she has not been taking these at the facility.   Discussed MOST form and encouraged [name] to discuss and complete once he talks with his brother.   Palliative services to follow at nursing facility. Social work consulted. Family agrees with this.   PMT not at [facility] over the weekend but will f/u next week if still hospitalized.   Code Status/Advance Care Planning:  DNR   Symptom Management:   Per attending  Palliative Prophylaxis:   Aspiration, Delirium Protocol, Frequent Pain Assessment, Oral Care and Turn Reposition  Additional Recommendations (Limitations, Scope, Preferences):  Full Scope Treatment-except DNR/DNI  Psycho-social/Spiritual:   Desire for further Chaplaincy  support:no  Additional Recommendations: Caregiving  Support/Resources  Prognosis:   Unable to determine  Discharge Planning: Long term SNF patient with palliative to follow.     Primary Diagnoses: Present on Admission: . Sepsis ([place])   I have reviewed the medical record, interviewed the patient and family, and examined the patient. The following aspects are pertinent.  Past Medical History:  Diagnosis Date  . A-fib ([place]) 02/14/2015  . Arthritis   . Atrial fibrillation ([place])   . Colovaginal fistula   . Complication of anesthesia    DIFFICULTY WAKING   . CVA (cerebral infarction) 02/14/2015  . Dementia   . GERD without esophagitis 02/14/2015  . Glucose intolerance (impaired glucose tolerance)   . Gout   . Hyperlipidemia   . Recurrent UTI   . Seizure ([place]) 02/14/2015   Social History   Social History  . Marital status: Widowed    Spouse name: N/A  . Number of children: 2  . Years of education: N/A   Social History Main Topics  . Smoking status: Never Smoker  . Smokeless tobacco: Never Used  . Alcohol use No  . Drug use: No  . Sexual activity: No   Other Topics Concern  . None   Social History Narrative  . None   Family History  Problem Relation Age of Onset  . Atrial fibrillation Sister    Scheduled Meds: . apixaban  5 mg Oral BID  . atorvastatin  10 mg Oral q1800  . cefTRIAXone  1 g Intravenous Q24H  . cholecalciferol  1,000 Units Oral Daily  . diltiazem  30 mg Oral Q6H  . donepezil  10 mg Oral QHS  . feeding supplement (ENSURE ENLIVE)  237 mL Oral BID BM  . feeding supplement (PRO-STAT 64)  30 mL Oral Daily  . levETIRAcetam  500 mg Oral BID  . megestrol  40 mg Oral Daily  . memantine  10 mg Oral BID  . niacin  500 mg Oral Q breakfast  . pantoprazole  40 mg Oral Daily  . polyethylene glycol  17 g Oral Daily  . senna-docusate  1 tablet Oral Daily  . sodium chloride flush  3 mL Intravenous Q12H  . ascorbic acid  500 mg Oral Daily   Continuous  Infusions: . sodium chloride 125 mL/hr at 11/02/16 0816   PRN Meds:.acetaminophen **OR** acetaminophen, guaifenesin, ketotifen, ondansetron **OR** ondansetron (ZOFRAN) IV, senna-docusate, traMADol Medications Prior to Admission:  Prior to Admission medications   Medication Sig Start Date End Date Taking? Authorizing Provider  acetaminophen (TYLENOL) 500 MG tablet Take 2 tablets (1,000 mg total) by mouth every 8 (eight) hours. 10/23/16 10/23/17 Yes Rudene Re, MD  apixaban (ELIQUIS) 5 MG TABS tablet Take 1 tablet (5 mg total) by mouth 2 (  two) times daily. 04/01/15  Yes Reyne Dumas, MD  ascorbic acid (VITAMIN C) 500 MG tablet Take 500 mg by mouth daily.   Yes Historical Provider, MD  divalproex (DEPAKOTE SPRINKLES) 125 MG capsule Take 125 mg by mouth at bedtime.   Yes Historical Provider, MD  donepezil (ARICEPT) 5 MG tablet Take 1 tablet (5 mg total) by mouth at bedtime. Patient taking differently: Take 10 mg by mouth at bedtime.  04/01/15  Yes Reyne Dumas, MD  esomeprazole (NEXIUM) 20 MG capsule Take 20 mg by mouth daily at 12 noon.   Yes Historical Provider, MD  furosemide (LASIX) 20 MG tablet Take 20 mg by mouth daily.     Yes Historical Provider, MD  guaifenesin (ROBITUSSIN) 100 MG/5ML syrup Take 100 mg by mouth 3 (three) times daily as needed for cough.   Yes Historical Provider, MD  ketotifen (ZADITOR) 0.025 % ophthalmic solution Place 1 drop into both eyes daily as needed (for dry eyes).   Yes Historical Provider, MD  levETIRAcetam (KEPPRA) 500 MG tablet Take 500 mg by mouth 2 (two) times daily.    Yes Historical Provider, MD  megestrol (MEGACE) 40 MG tablet Take 40 mg by mouth daily.   Yes Historical Provider, MD  memantine (NAMENDA) 10 MG tablet Take 10 mg by mouth 2 (two) times daily.   Yes Historical Provider, MD  metoprolol (LOPRESSOR) 50 MG tablet Take 50 mg by mouth daily.     Yes Historical Provider, MD  oxyCODONE (ROXICODONE) 5 MG immediate release tablet Take 1 tablet (5 mg  total) by mouth every 6 (six) hours as needed for moderate pain or severe pain. 10/23/16 10/23/17 Yes Rudene Re, MD  polyethylene glycol [facility]) packet Take 17 g by mouth daily. 04/01/15  Yes Reyne Dumas, MD  potassium chloride (KLOR-CON) 10 MEQ CR tablet Take 10 mEq by mouth daily.     Yes Historical Provider, MD  QUEtiapine (SEROQUEL) 50 MG tablet Take 1 tablet (50 mg total) by mouth at bedtime. 04/01/15  Yes Reyne Dumas, MD  senna-docusate (SENOKOT S) 8.6-50 MG tablet Take 1 tablet by mouth daily. 10/23/16  Yes Rudene Re, MD  Amino Acids-Protein Hydrolys (FEEDING SUPPLEMENT, PRO-STAT 64,) LIQD Take 30 mLs by mouth daily.    Historical Provider, MD  simvastatin (ZOCOR) 20 MG tablet Take 20 mg by mouth daily.    Historical Provider, MD   No Known Allergies Review of Systems  Unable to perform ROS: Dementia   Physical Exam  Constitutional: She is easily aroused. She appears ill.  Cardiovascular: Normal heart sounds.  An irregularly irregular rhythm present.  afib  Pulmonary/Chest: Effort normal. She has decreased breath sounds.  Abdominal: Soft. Bowel sounds are normal.  Neurological: She is alert and easily aroused.  confused  Skin: Skin is warm and dry.  Psychiatric: Her speech is delayed. Cognition and memory are impaired. She is inattentive.  Nursing note and vitals reviewed.  Vital Signs: BP 90/67 (BP Location: Left Arm)   Pulse (!) 111   Temp 98.1 F (36.7 C) (Oral)   Resp 18   Ht 5' 4"  (1.626 m)   Wt 102.2 kg (225 lb 6.4 oz)   SpO2 99%   BMI 38.69 kg/m  Pain Assessment: PAINAD   Pain Score: Asleep  SpO2: SpO2: 99 % O2 Device:SpO2: 99 % O2 Flow Rate: .   IO: Intake/output summary:   Intake/Output Summary (Last 24 hours) at 11/02/16 1123 Last data filed at 11/02/16 0536  Gross per 24 hour  Intake             3185 ml  Output              350 ml  Net             2835 ml    LBM: Last BM Date:  (Last BM unknown. Pt on senokot and miralax.) Baseline  Weight: Weight: 98.4 kg (217 lb) Most recent weight: Weight: 102.2 kg (225 lb 6.4 oz)     Palliative Assessment/Data: PPS 30%   Flowsheet Rows   Flowsheet Row Most Recent Value  Intake Tab  Referral Department  Hospitalist  Unit at Time of Referral  Oncology Unit  Palliative Care Primary Diagnosis  Sepsis/Infectious Disease  Date Notified  11/01/16  Palliative Care Type  New Palliative care  Reason for referral  Clarify Goals of Care  Date of Admission  10/31/16  # of days IP prior to Palliative referral  1  Clinical Assessment  Palliative Performance Scale Score  30%  Psychosocial & Spiritual Assessment  Palliative Care Outcomes  Patient/Family meeting held?  Yes  Who was at the meeting?  patient and son [name])  Palliative Care Outcomes  Clarified goals of care, Provided advance care planning, Provided psychosocial or spiritual support     Time In: 0850 Time Out: 1000 Time Total: 24mn Greater than 50%  of this time was spent counseling and coordinating care related to the above assessment and plan.  Signed by:  MIhor Dow FNP-C Palliative Medicine Team  Phone: [phone]: [phone]  Please contact Palliative Medicine Team phone at [phone] questions and concerns.  For individual provider: See AShea Evans

## 2016-11-02 NOTE — Progress Notes (Signed)
[facility] at [place] NAME: Tamitha Rooke    MR#:  [id number]  DATE OF BIRTH:  01/06/1941  SUBJECTIVE:  CHIEF COMPLAINT:   Chief Complaint  Patient presents with  . Shortness of Breath  Difficult to assess as she is not communicating much at this time, son at bedside  REVIEW OF SYSTEMS:  Review of Systems  Unable to perform ROS: Dementia   DRUG ALLERGIES:  No Known Allergies VITALS:  Blood pressure 92/69, pulse (!) 138, temperature 98.4 F (36.9 C), temperature source Oral, resp. rate 20, height 5\' 4"  (1.626 m), weight 102.2 kg (225 lb 6.4 oz), SpO2 100 %. PHYSICAL EXAMINATION:  Physical Exam  Constitutional: She is well-developed, well-nourished, and in no distress.  HENT:  Head: Normocephalic and atraumatic.  Eyes: Conjunctivae and EOM are normal. Pupils are equal, round, and reactive to light.  Neck: Normal range of motion. Neck supple. No tracheal deviation present. No thyromegaly present.  Cardiovascular: Normal rate, regular rhythm and normal heart sounds.   Pulmonary/Chest: Effort normal and breath sounds normal. No respiratory distress. She has no wheezes. She exhibits no tenderness.  Abdominal: Soft. Bowel sounds are normal. She exhibits no distension. There is no tenderness.  Musculoskeletal:       Right shoulder: She exhibits normal range of motion and no tenderness.  Tenderness in the right thigh area  Neurological: She is alert. No cranial nerve deficit.  Difficult to assess as she is demented  Skin: Skin is warm and dry. No rash noted.  Psychiatric: Mood and affect normal.   LABORATORY PANEL:   CBC  Recent Labs Lab 11/02/16 0530  WBC 7.9  HGB 8.1*  HCT 24.6*  PLT 247   ------------------------------------------------------------------------------------------------------------------ Chemistries   Recent Labs Lab 10/31/16 0342  11/02/16 0530  NA 137  < > 141  K 4.2  < > 3.3*  CL 109  < > 116*  CO2 20*   < > 20*  GLUCOSE 128*  < > 79  BUN 23*  < > 9  CREATININE 0.61  < > 0.31*  CALCIUM 9.0  < > 8.0*  AST 22  --   --   ALT 11*  --   --   ALKPHOS 55  --   --   BILITOT 1.9*  --   --   < > = values in this interval not displayed. RADIOLOGY:  No results found. ASSESSMENT AND PLAN:  75 year old female patient with history of atrial fibrillation, right femur fracture status post surgery CVA with left-sided weakness, dementia, GERD, seizure disorder presented to the emergency room from [facility] comments facility for fever and weakness.  * Sepsis due to UTI present on admission - Continue IV Rocephin  * E coli UTI: based on urine c/s, pending c/s - .  Management as above  * Hypotension/rapid a.fib: already on eliquis for anticoagulation - Cardizem to control rate if BP can tolerate, increase rate of IVF - Bladder scan and if enough urine, try in & out cath, if no success can place foley - if doesn't improve, may need transfer to ICU step-down for close monitoring -  Lactate trending down  * Right supracondylar fracture distal femur - Status post fall about a week ago.  Appreciate Ortho input and assistance - placed knee range of motion brace to the right lower extremity by Dr Marry Guan.  - not a good surgical candidate  4. Dementia - Mental status likely worsened due to  sepsis  5. CVA with left-sided weakness - continue eliquis for anticoagulation   Poor prognosis  All the records are reviewed and case discussed with Care Management/Social Worker. Management plans discussed with the patient, family and they are in agreement.  CODE STATUS: DNR, Palliative care following  TOTAL TIME TAKING CARE OF THIS PATIENT: 35 minutes.   High risk for cardio-pulmo arrest, multiorgan failure and death  More than 50% of the time was spent in counseling/coordination of care: YES  POSSIBLE D/C IN 1-2 DAYS, DEPENDING ON CLINICAL CONDITION.   Max Sane M.D on 11/02/2016 at 5:17 PM  Between  7am to 6pm - Pager - [phone]  After 6pm go to [email or web address] - Proofreader  [facility]  Office  [phone]  CC: Primary care physician; Kirk Ruths., MD  Note: This dictation was prepared with Dragon dictation along with smaller phrase technology. Any transcriptional errors that result from this process are unintentional.

## 2016-11-02 NOTE — Plan of Care (Signed)
Problem: Education: Goal: Knowledge of [place] Education information/materials will improve Outcome: Not Progressing Pt alert to self and place. Talks at times, minimal speech.   Problem: Pain Managment: Goal: General experience of comfort will improve Outcome: Progressing Tramadol given x2 for signs of pain with improvement.  Problem: Physical Regulation: Goal: Ability to maintain clinical measurements within normal limits will improve Outcome: Progressing SBP in the 90's throughout the day. Cardizem PO held at noon. Around 1400 HR 125-150's A.fib. Dr Manuella Ghazi notified, Cardizem 5mg  IV given and 573ml ns bolus with some improvement in HR. At shift change notified Dr Leslye Peer that BP 90/54, HR 110-130's A.fib. Cardizem PO give per MD.  Problem: Nutrition: Goal: Adequate nutrition will be maintained Outcome: Not Progressing Poor PO intake. Pt drink some of the supplements.

## 2016-11-02 NOTE — Progress Notes (Signed)
Notified Dr Leslye Peer that pt's BP post 582ml bolus 90/54, HR 110-130'sA.fib. Pt received cardized IV at 1518 with some improvement. Cardizem PO 1800 dose held. Per MD to give the cardizem PO.

## 2016-11-03 LAB — BASIC METABOLIC PANEL
Anion gap: 4 — ABNORMAL LOW (ref 5–15)
BUN: 10 mg/dL (ref 6–20)
CALCIUM: 8.3 mg/dL — AB (ref 8.9–10.3)
CO2: 20 mmol/L — AB (ref 22–32)
CREATININE: 0.36 mg/dL — AB (ref 0.44–1.00)
Chloride: 118 mmol/L — ABNORMAL HIGH (ref 101–111)
GFR calc non Af Amer: >60 mL/min (ref >60)
Glucose, Bld: 84 mg/dL (ref 65–99)
Potassium: 3.7 mmol/L (ref 3.5–5.1)
Sodium: 142 mmol/L (ref 135–145)

## 2016-11-03 LAB — CBC
HCT: 27.6 % — ABNORMAL LOW (ref 35.0–47.0)
Hemoglobin: 9.3 g/dL — ABNORMAL LOW (ref 12.0–16.0)
MCH: 32.4 pg (ref 26.0–34.0)
MCHC: 33.6 g/dL (ref 32.0–36.0)
MCV: 96.4 fL (ref 80.0–100.0)
PLATELETS: 262 10*3/uL (ref 150–440)
RBC: 2.86 MIL/uL — AB (ref 3.80–5.20)
RDW: 14.7 % — ABNORMAL HIGH (ref 11.5–14.5)
WBC: 9.5 10*3/uL (ref 3.6–11.0)

## 2016-11-03 MED ORDER — NYSTATIN 100000 UNIT/ML MT SUSP
5.0000 mL | Freq: Four times a day (QID) | OROMUCOSAL | Status: DC
  Administered 2016-11-03 – 2016-11-05 (×8): 500000 [IU] via ORAL
  Filled 2016-11-03 (×7): qty 5

## 2016-11-03 NOTE — Clinical Social Work Note (Signed)
Clinical Social Work Assessment  Patient Details  Name: [name] MRN: [id number] Date of Birth: August 11, 1941  Date of referral:  11/03/16               Reason for consult:  Facility Placement                Permission sought to share information with:  Facility Art therapist granted to share information::  Yes, Verbal Permission Granted  Name::        Agency::     Relationship::     Contact Information:     Housing/Transportation Living arrangements for the past 2 months:  [place] of Information:  Adult Children Patient Interpreter Needed:  None Criminal Activity/Legal Involvement Pertinent to Current Situation/Hospitalization:  No - Comment as needed Significant Relationships:  Adult Children Lives with:  Facility Resident Do you feel safe going back to the place where you live?  Yes Need for family participation in patient care:  No (Coment)  Care giving concerns:  Patient admitted from [facility], does not want to return.   Social Worker assessment / plan:  CSW contacted the patient's son, [name], to discuss preferences for LTC placement. [name] indicated that their first choice is [facility], and that his mother would not like to return to [facility] on discharge. CSW explained that referral will be sent to all facilities in the area. [name] verbalized understanding.  Employment status:  Retired Nurse, adult PT Recommendations:  [place] / Referral to community resources:  [place]  Patient/Family's Response to care: [name] thanked CSW for assistance.  Patient/Family's Understanding of and Emotional Response to Diagnosis, Current Treatment, and Prognosis:  [name] able to verbalize dc goals and understands barriers to placement.  Emotional Assessment Appearance:   (Patient was sleeping.) Attitude/Demeanor/Rapport:   (Patient was sleeping.) Affect  (typically observed):   (Patient was sleeping.) Orientation:  Oriented to Self, Oriented to Place, Oriented to Situation Alcohol / Substance use:  Never Used Psych involvement (Current and /or in the community):  No (Comment)  Discharge Needs  Concerns to be addressed:  Discharge Planning Concerns (Patient and family want to change LTC SNF) Readmission within the last 30 days:  No Current discharge risk:  Chronically ill Barriers to Discharge:  Continued Medical Work up   [employer], LCSW 11/03/2016, 11:31 AM

## 2016-11-03 NOTE — Progress Notes (Signed)
ORTHOPAEDICS PROGRESS NOTE  PATIENT NAME: [name] DOB: [birth year]  MRN: [id number]  Subjective: Patient is more alert today. Awake and more responsive to verbal cues. Son is at bedside. He states that she has taken some Ensure, ice, and food.  Objective: Vital signs in last 24 hours: Temp:  [97.6 F (36.4 C)-98.6 F (37 C)] 97.8 F (36.6 C) (12/16 1326) Pulse Rate:  [84-138] 99 (12/16 1326) Resp:  [18-24] 18 (12/16 1326) BP: (90-119)/(54-82) 119/82 (12/16 1326) SpO2:  [100 %] 100 % (12/16 1326)  Intake/Output from previous day: 12/15 0701 - 12/16 0700 In: 3794 [P.O.:240; I.V.:3054; IV Piggyback:500] Out: -    Recent Labs  11/01/16 0609 11/02/16 0530 11/03/16 0434  WBC 8.0 7.9 9.5  HGB 7.6* 8.1* 9.3*  HCT 23.0* 24.6* 27.6*  PLT 259 247 262  K 3.5 3.3* 3.7  CL 116* 116* 118*  CO2 18* 20* 20*  BUN 12 9 10   CREATININE 0.38* 0.31* 0.36*  GLUCOSE 106* 79 84  CALCIUM 8.3* 8.0* 8.3*    EXAM General: Awake Right lower extremity: Right knee brace in place and locked in full extension. Skin is intact. The right lower extremity is elevated on 1 pillow. Slight swelling to the right foot.  Assessment: Right supracondylar distal femur fracture   Principal Problem:   Sepsis ([place]) Active Problems:   Pressure injury of skin   Palliative care encounter   Goals of care, counseling/discussion   DNR (do not resuscitate) discussion   Plan: Discussed the status with her son. She appears to be responding to the antibiotics. Heart rate and blood pressure have been labile. Plan to continue with the brace. Patient will need follow-up in the office in about 2 weeks.  Randeep Biondolillo P. Holley Bouche M.D.

## 2016-11-03 NOTE — Progress Notes (Signed)
[facility] at [place] NAME: [name]    MRN#:  [id number]  DATE OF BIRTH:  [birth year]  SUBJECTIVE:  Hospital Day: 3 days [name] is a 75 y.o. female presenting with Shortness of Breath .   Overnight events: Episode of atrial fibrillation yesterday high heart pain  Interval Events: Patient unable to provide accurate information does state that her hip hurts  REVIEW OF SYSTEMS:  Unable to get accurate information  DRUG ALLERGIES:  No Known Allergies  VITALS:  Blood pressure 106/81, pulse (!) 105, temperature 97.6 F (36.4 C), temperature source Oral, resp. rate 20, height 5\' 4"  (1.626 m), weight 102.2 kg (225 lb 6.4 oz), SpO2 100 %.  PHYSICAL EXAMINATION:   VITAL SIGNS: Vitals:   11/03/16 0844 11/03/16 1202  BP: 104/70 106/81  Pulse: (!) 106 (!) 105  Resp: 20   Temp: 97.6 F (36.4 C)    GENERAL:75 y.o.female moderate distress given mental status.  HEAD: Normocephalic, atraumatic.  EYES: Pupils equal, round, reactive to light. Unable to assess extraocular muscles given mental status/medical condition. No scleral icterus.  MOUTH: Moist mucosal membrane. Dentition intact. No abscess noted.  EAR, NOSE, THROAT: Clear without exudates. No external lesions.  NECK: Supple. No thyromegaly. No nodules. No JVD.  PULMONARY: Clear to ascultation, without wheeze rails or rhonci. No use of accessory muscles, Good respiratory effort. good air entry bilaterally CHEST: Nontender to palpation.  CARDIOVASCULAR: S1 and S2. Regular rate and rhythm. No murmurs, rubs, or gallops. No edema. Pedal pulses 2+ bilaterally.  GASTROINTESTINAL: Soft, nontender, nondistended. No masses. Positive bowel sounds. No hepatosplenomegaly.  MUSCULOSKELETAL: No swelling, clubbing, or edema. Range of motion full in all extremities.  NEUROLOGIC: Unable to assess given mental status/medical condition SKIN: No ulceration, lesions, rashes, or cyanosis. Skin warm  and dry. Turgor intact.  PSYCHIATRIC: Unable to assess given mental status/medical condition      LABORATORY PANEL:   CBC  Recent Labs Lab 11/03/16 0434  WBC 9.5  HGB 9.3*  HCT 27.6*  PLT 262   ------------------------------------------------------------------------------------------------------------------  Chemistries   Recent Labs Lab 10/31/16 0342  11/03/16 0434  NA 137  < > 142  K 4.2  < > 3.7  CL 109  < > 118*  CO2 20*  < > 20*  GLUCOSE 128*  < > 84  BUN 23*  < > 10  CREATININE 0.61  < > 0.36*  CALCIUM 9.0  < > 8.3*  AST 22  --   --   ALT 11*  --   --   ALKPHOS 55  --   --   BILITOT 1.9*  --   --   < > = values in this interval not displayed. ------------------------------------------------------------------------------------------------------------------  Cardiac Enzymes No results for input(s): TROPONINI in the last 168 hours. ------------------------------------------------------------------------------------------------------------------  RADIOLOGY:  No results found.  EKG:   Orders placed or performed during the hospital encounter of 10/31/16  . EKG 12-Lead  . EKG 12-Lead  . ED EKG 12-Lead  . ED EKG 12-Lead    ASSESSMENT AND PLAN:   [name] is a 75 y.o. female presenting with Shortness of Breath . Admitted 10/31/2016 : Day #: 3 days   1. Sepsis: ecoli uti continue current antibiotics 2. afib rvr: rate controlled today on cardizem 3. Right supracondylar fracture: brace, pain control 4. cva with left weakness: eliquis  Disposition: SNF  All the records are reviewed and case discussed with Care Management/Social Workerr. Management  plans discussed with the patient, family and they are in agreement.  CODE STATUS: dnr TOTAL TIME TAKING CARE OF THIS PATIENT: 28 minutes.   POSSIBLE D/C IN 1-2DAYS, DEPENDING ON CLINICAL CONDITION.   [name],  Karenann Cai.D on 11/03/2016 at 12:14 PM  Between 7am to 6pm - Pager - [phone]  After  6pm: House Pager: - [facility]  Office  [phone]  CC: Primary care physician; Kirk Ruths., MD

## 2016-11-04 LAB — URINE CULTURE

## 2016-11-04 NOTE — Progress Notes (Signed)
Called [facility] to order beriatric bed. Spoke to Fraser. Confirmation order number: [id number].

## 2016-11-04 NOTE — Consult Note (Addendum)
[place] Nurse wound consult note Reason for Consult: Patient with two partial thickness, Stage 2 pressure injuries over previously healed pressure injuries at the left ischial tuberosity and coccyx, and new Deep Tissue Pressure Injury (DTPI) to left heel. Moisture associated skin damage in the perianal area (blanching erythema, no broken skin). Assisted in my assessment today by the bedside RN Herschel [place]) and 2 Nursing Techs ([place] and Mickel Baas). Wound type:Pressure, moisture, specifically incontinence associated dermatitis Pressure Ulcer POA: Yes Measurement:Left heel DTPI measuring 4cm x 3.5cm with deep purple discoloration.  Left ischial tuberosity with linear partial thickness skin loss measuring 3cm x 1cm x 0.1cm.  Coccxy with partial thickness skin loss measuring 2cm x 1cm x 0.1cm. Wound [id number], scant moisture Drainage (amount, consistency, odor) Scant moisture Periwound:Intact, dry with evidence of previous wound healing Dressing procedure/placement/frequency: I will provide patient with a bariatric bed with low air loss feature as she has little room when turning side to side in the standard bed due to body habitus.  Additionally, I will provide a pressure redistribution heel boot to the left heel (DPTI in this area), topical wound care guidance is provided for Nursing via the Orders for prompt cleaning of the skin following fecal incontinence using the house skin care products (specifically our pH balanced, no rinse cleanser and clear zinc moisture barrier).  Patient was incontinence of stool x2 during my assessment today. She is at high risk for continued skin breakdown despite efforts aimed at prevention. Orthopedic MD was just in for consultation and his note indicates that there is no skin breakdown on the RLE beneath the brace.  I do not remove it today for that reason. Nurse notes ecchymosis at right lateral thigh, right posterior thigh, right lateral malleolus and right lateral LE and MD believes  this ecchymosis is due to anticoagulation. Nevertheless, Nurse reports that she and MD agree to loosen straps on LE immobilizer. [place] nursing team will not follow, but will remain available to this patient, the nursing and medical teams.  Please re-consult if needed. Thanks, Maudie Flakes, MSN, RN, [place], Arther Abbott  Pager# [phone]

## 2016-11-04 NOTE — Plan of Care (Signed)
Problem: Education: Goal: Knowledge of [facility] Education information/materials will improve Outcome: Progressing Per family  Problem: Safety: Goal: Ability to remain free from injury will improve Outcome: Not Progressing Deep tissue injury under right leg brace  Problem: Activity: Goal: Risk for activity intolerance will decrease Outcome: Not Applicable Date Met: 72/09/19 Right femur fracture  Problem: Fluid Volume: Goal: Ability to maintain a balanced intake and output will improve Outcome: Not Progressing Pt refused dinner.  Problem: Nutrition: Goal: Adequate nutrition will be maintained Outcome: Not Progressing Pt with poor appetite  Problem: Bowel/Gastric: Goal: Will not experience complications related to bowel motility Outcome: Progressing LBM 12/17

## 2016-11-04 NOTE — Plan of Care (Signed)
Problem: Education: Goal: Knowledge of [facility] Education information/materials will improve Outcome: Not Progressing Pt alert to self. Follow simple commands. Withdrawn.  Problem: Skin Integrity: Goal: Risk for impaired skin integrity will decrease Outcome: Progressing Skin care to L buttock and coccyx performed per order. Prevalon boot applied to L heel. Pt placed on bariatric bed with air mattress.  Problem: Fluid Volume: Goal: Ability to maintain a balanced intake and output will improve Outcome: Not Progressing Poor PO intake. Few bites and sips. Few sips of Ensure. Dietitian consult ordered.  Problem: Nutrition: Goal: Adequate nutrition will be maintained Outcome: Not Progressing As above  Problem: Bowel/Gastric: Goal: Will not experience complications related to bowel motility Outcome: Progressing Liquid/mushy stools. Laxatives held.

## 2016-11-04 NOTE — Progress Notes (Signed)
[facility] at [place] NAME: [name]    MRN#:  [id number]  DATE OF BIRTH:  12-02-1940  SUBJECTIVE:  Hospital Day: 4 days [name] is a 75 y.o. female presenting with Shortness of Breath .   Overnight events: No acute overnight events  Interval Events: Patient unable to provide accurate information   REVIEW OF SYSTEMS:  Unable to get accurate information  DRUG ALLERGIES:  No Known Allergies  VITALS:  Blood pressure 94/61, pulse (!) 105, temperature 98.1 F (36.7 C), temperature source Oral, resp. rate (!) 21, height 5\' 4"  (1.626 m), weight 102.2 kg (225 lb 6.4 oz), SpO2 100 %.  PHYSICAL EXAMINATION:   VITAL SIGNS: Vitals:   11/04/16 0003 11/04/16 0423  BP: 103/71 94/61  Pulse: (!) 102 (!) 105  Resp: 20 (!) 21  Temp: 98.2 F (36.8 C) 98.1 F (36.7 C)   GENERAL:75 y.o.female moderate distress given mental status.  HEAD: Normocephalic, atraumatic.  EYES: Pupils equal, round, reactive to light. Unable to assess extraocular muscles given mental status/medical condition. No scleral icterus.  MOUTH: Moist mucosal membrane. Dentition intact. No abscess noted.  EAR, NOSE, THROAT: Clear without exudates. No external lesions.  NECK: Supple. No thyromegaly. No nodules. No JVD.  PULMONARY: Clear to ascultation, without wheeze rails or rhonci. No use of accessory muscles, Good respiratory effort. good air entry bilaterally CHEST: Nontender to palpation.  CARDIOVASCULAR: S1 and S2. Regular rate and rhythm. No murmurs, rubs, or gallops. No edema. Pedal pulses 2+ bilaterally.  GASTROINTESTINAL: Soft, nontender, nondistended. No masses. Positive bowel sounds. No hepatosplenomegaly.  MUSCULOSKELETAL: No swelling, clubbing, or edema. Range of motion full in all extremities.  NEUROLOGIC: Unable to assess given mental status/medical condition SKIN:Decubitus ulcer noted otherwise No ulceration, lesions, rashes, or cyanosis. Skin warm and  dry. Turgor intact.  PSYCHIATRIC: Unable to assess given mental status/medical condition      LABORATORY PANEL:   CBC  Recent Labs Lab 11/03/16 0434  WBC 9.5  HGB 9.3*  HCT 27.6*  PLT 262   ------------------------------------------------------------------------------------------------------------------  Chemistries   Recent Labs Lab 10/31/16 0342  11/03/16 0434  NA 137  < > 142  K 4.2  < > 3.7  CL 109  < > 118*  CO2 20*  < > 20*  GLUCOSE 128*  < > 84  BUN 23*  < > 10  CREATININE 0.61  < > 0.36*  CALCIUM 9.0  < > 8.3*  AST 22  --   --   ALT 11*  --   --   ALKPHOS 55  --   --   BILITOT 1.9*  --   --   < > = values in this interval not displayed. ------------------------------------------------------------------------------------------------------------------  Cardiac Enzymes No results for input(s): TROPONINI in the last 168 hours. ------------------------------------------------------------------------------------------------------------------  RADIOLOGY:  No results found.  EKG:   Orders placed or performed during the hospital encounter of 10/31/16  . EKG 12-Lead  . EKG 12-Lead  . ED EKG 12-Lead  . ED EKG 12-Lead    ASSESSMENT AND PLAN:   [name] is a 75 y.o. female presenting with Shortness of Breath . Admitted 10/31/2016 : Day #: 4 days   1. Sepsis: ecoli uti continue current antibiotics, Continue ceftriaxone today-course will be completed today 2. afib rvr: rate controlled cardizem 3. Right supracondylar fracture: brace, pain control 4. cva with left weakness: eliquis 5. Sacral decubitus ulcer present on admission: Wound consult appreciated   Disposition:  SNF  All the records are reviewed and case discussed with Care Management/Social Workerr. Management plans discussed with the patient, family and they are in agreement.  CODE STATUS: dnr TOTAL TIME TAKING CARE OF THIS PATIENT: 28 minutes.   POSSIBLE D/C IN 1-2DAYS, DEPENDING ON  CLINICAL CONDITION.   [place],  Karenann Cai.D on 11/04/2016 at 12:05 PM  Between 7am to 6pm - Pager - [phone]  After 6pm: House Pager: - [facility]  Office  [phone]  CC: Primary care physician; Kirk Ruths., MD

## 2016-11-04 NOTE — Care Management Important Message (Signed)
Important Message  Patient Details  Name: [name] MRN: [id number] Date of Birth: 1941/02/21   Medicare Important Message Given:  Yes    Pharaoh Pio A, RN 11/04/2016, 4:45 PM

## 2016-11-05 LAB — CULTURE, BLOOD (ROUTINE X 2)
CULTURE: NO GROWTH
CULTURE: NO GROWTH

## 2016-11-05 MED ORDER — DILTIAZEM HCL 30 MG PO TABS: 30.0000 mg | ORAL_TABLET | Freq: Four times a day (QID) | ORAL | 0 refills | Status: DC

## 2016-11-05 MED ORDER — ENSURE ENLIVE PO LIQD: 237.0000 mL | Freq: Two times a day (BID) | ORAL | 12 refills | Status: AC

## 2016-11-05 MED ORDER — MEGESTROL ACETATE 40 MG PO TABS: 40.0000 mg | ORAL_TABLET | Freq: Every day | ORAL | 0 refills | Status: DC

## 2016-11-05 MED ORDER — ACETAMINOPHEN 325 MG PO TABS: 650.0000 mg | ORAL_TABLET | Freq: Four times a day (QID) | ORAL | 0 refills | Status: AC | PRN

## 2016-11-05 MED ORDER — TRAMADOL HCL 50 MG PO TABS: 50.0000 mg | ORAL_TABLET | Freq: Four times a day (QID) | ORAL | 0 refills | Status: DC | PRN

## 2016-11-05 NOTE — Discharge Summary (Addendum)
[facility] NAME: [name]    MR#:  [id number]  DATE OF BIRTH:  11-May-1941  DATE OF ADMISSION:  10/31/2016 ADMITTING PHYSICIAN: Vaughan Basta, MD  DATE OF DISCHARGE: 11/05/16  PRIMARY CARE PHYSICIAN: Kirk Ruths., MD    ADMISSION DIAGNOSIS:  Acute cystitis without hematuria [N30.00] Sepsis, due to unspecified organism ([place]) [A41.9]  DISCHARGE DIAGNOSIS:   Sepsis ([place]) Ecoli uti: resolved Right supracondylar distal femur fracture  Sacral decubitus ulcer present on admission  SECONDARY DIAGNOSIS:   Past Medical History:  Diagnosis Date  . A-fib ([place]) 02/14/2015  . Arthritis   . Atrial fibrillation ([place])   . Colovaginal fistula   . Complication of anesthesia    DIFFICULTY WAKING   . CVA (cerebral infarction) 02/14/2015  . Dementia   . GERD without esophagitis 02/14/2015  . Glucose intolerance (impaired glucose tolerance)   . Gout   . Hyperlipidemia   . Recurrent UTI   . Seizure [facility]) 02/14/2015    HOSPITAL COURSE:  [name]  is a 75 y.o. female admitted 10/31/2016 with chief complaint Shortness of Breath . Please see H&P performed by Vaughan Basta, MD for further information. Patient presented with the above symptoms, meeting septic criteria on admission secondary to UTI. She was placed on broad antibiotics and completed course with improvement. She also had atrial fibrillation with rapid ventricular response while in the hospital. Heart rate controlled with cardizem, lopressor stopped due to blood pressure.  For pressure ulcers - evaluated by wound therapy with recommendations below Right supracondylar distal femur fracture: orthopedic surgery evlauated - The splint was discontinued. knee range of motion brace to the right lower extremity locked in extension.  DISCHARGE CONDITIONS:   stable  CONSULTS OBTAINED:  Treatment Team:  Dereck Leep, MD  DRUG ALLERGIES:  No Known  Allergies  DISCHARGE MEDICATIONS:   Current Discharge Medication List    START taking these medications   Details  diltiazem (CARDIZEM) 30 MG tablet Take 1 tablet (30 mg total) by mouth every 6 (six) hours. Qty: 120 tablet, Refills: 0    feeding supplement, ENSURE ENLIVE, (ENSURE ENLIVE) LIQD Take 237 mLs by mouth 2 (two) times daily between meals. Qty: 237 mL, Refills: 12    traMADol (ULTRAM) 50 MG tablet Take 1 tablet (50 mg total) by mouth every 6 (six) hours as needed for moderate pain or severe pain. Qty: 30 tablet, Refills: 0      CONTINUE these medications which have CHANGED   Details  acetaminophen (TYLENOL) 325 MG tablet Take 2 tablets (650 mg total) by mouth every 6 (six) hours as needed for mild pain (or Fever >/= 101). Qty: 30 tablet, Refills: 0    !! megestrol (MEGACE) 40 MG tablet Take 1 tablet (40 mg total) by mouth daily. Qty: 30 tablet, Refills: 0     !! - Potential duplicate medications found. Please discuss with provider.    CONTINUE these medications which have NOT CHANGED   Details  apixaban (ELIQUIS) 5 MG TABS tablet Take 1 tablet (5 mg total) by mouth 2 (two) times daily. Qty: 60 tablet, Refills: 5    ascorbic acid (VITAMIN C) 500 MG tablet Take 500 mg by mouth daily.    divalproex (DEPAKOTE SPRINKLES) 125 MG capsule Take 125 mg by mouth at bedtime.    donepezil (ARICEPT) 5 MG tablet Take 1 tablet (5 mg total) by mouth at bedtime. Qty: 30 tablet, Refills: 0    esomeprazole (  NEXIUM) 20 MG capsule Take 20 mg by mouth daily at 12 noon.    furosemide (LASIX) 20 MG tablet Take 20 mg by mouth daily.      guaifenesin (ROBITUSSIN) 100 MG/5ML syrup Take 100 mg by mouth 3 (three) times daily as needed for cough.    ketotifen (ZADITOR) 0.025 % ophthalmic solution Place 1 drop into both eyes daily as needed (for dry eyes).    levETIRAcetam (KEPPRA) 500 MG tablet Take 500 mg by mouth 2 (two) times daily.     !! megestrol (MEGACE) 40 MG tablet Take 40 mg by  mouth daily.    memantine (NAMENDA) 10 MG tablet Take 10 mg by mouth 2 (two) times daily.    polyethylene glycol (MIRALAX) packet Take 17 g by mouth daily. Qty: 14 each, Refills: 0    potassium chloride (KLOR-CON) 10 MEQ CR tablet Take 10 mEq by mouth daily.      QUEtiapine (SEROQUEL) 50 MG tablet Take 1 tablet (50 mg total) by mouth at bedtime. Qty: 30 tablet, Refills: 2    senna-docusate (SENOKOT S) 8.6-50 MG tablet Take 1 tablet by mouth daily. Qty: 30 tablet, Refills: 1    Amino Acids-Protein Hydrolys (FEEDING SUPPLEMENT, PRO-STAT 64,) LIQD Take 30 mLs by mouth daily.    simvastatin (ZOCOR) 20 MG tablet Take 20 mg by mouth daily.     !! - Potential duplicate medications found. Please discuss with provider.    STOP taking these medications     metoprolol (LOPRESSOR) 50 MG tablet      oxyCODONE (ROXICODONE) 5 MG immediate release tablet          DISCHARGE INSTRUCTIONS:  continue with the brace.   wound consult note Patient with two partial thickness, Stage 2 pressure injuries over previously healed pressure injuries at the left ischial tuberosity and coccyx, and new Deep Tissue Pressure Injury (DTPI) to left heel. Moisture associated skin damage in the perianal area (blanching erythema, no broken skin).  Wound type:Pressure, moisture, specifically incontinence associated dermatitis Pressure Ulcer POA: Yes Measurement:Left heel DTPI measuring 4cm x 3.5cm with deep purple discoloration.  Left ischial tuberosity with linear partial thickness skin loss measuring 3cm x 1cm x 0.1cm.  Coccxy with partial thickness skin loss measuring 2cm x 1cm x 0.1cm. Wound [id number], scant moisture Drainage (amount, consistency, odor) Scant moisture Periwound:Intact, dry with evidence of previous wound healing  Dressing procedure/placement/frequency:   bariatric bed with low air loss feature  pressure redistribution heel boot to the left heel (DPTI in this area), topical wound care: prompt  cleaning of the skin following fecal incontinence using the house skin care products (specifically our pH balanced, no rinse cleanser and clear zinc moisture barrier).    DIET:  Regular diet  DISCHARGE CONDITION:  Stable  ACTIVITY:  Activity as tolerated  OXYGEN:  Home Oxygen: No.   Oxygen Delivery: room air  DISCHARGE LOCATION:  nursing home   If you experience worsening of your admission symptoms, develop shortness of breath, life threatening emergency, suicidal or homicidal thoughts you must seek medical attention immediately by calling 911 or calling your MD immediately  if symptoms less severe.  You Must read complete instructions/literature along with all the possible adverse reactions/side effects for all the Medicines you take and that have been prescribed to you. Take any new Medicines after you have completely understood and accpet all the possible adverse reactions/side effects.   Please note  You were cared for by a hospitalist during your hospital stay.  If you have any questions about your discharge medications or the care you received while you were in the hospital after you are discharged, you can call the unit and asked to speak with the hospitalist on call if the hospitalist that took care of you is not available. Once you are discharged, your primary care physician will handle any further medical issues. Please note that NO REFILLS for any discharge medications will be authorized once you are discharged, as it is imperative that you return to your primary care physician (or establish a relationship with a primary care physician if you do not have one) for your aftercare needs so that they can reassess your need for medications and monitor your lab values.    On the day of Discharge:   VITAL SIGNS:  Blood pressure 105/60, pulse 91, temperature 97.8 F (36.6 C), temperature source Oral, resp. rate 18, height 5\' 4"  (1.626 m), weight 102.2 kg (225 lb 6.4 oz), SpO2 100  %.  I/O:   Intake/Output Summary (Last 24 hours) at 11/05/16 1004 Last data filed at 11/05/16 0953  Gross per 24 hour  Intake          2433.33 ml  Output                0 ml  Net          2433.33 ml    PHYSICAL EXAMINATION:  GENERAL:  75 y.o.-year-old patient lying in the bed with no acute distress.  EYES: Pupils equal, round, reactive to light and accommodation. No scleral icterus. Extraocular muscles intact.  HEENT: Head atraumatic, normocephalic. Oropharynx and nasopharynx clear.  NECK:  Supple, no jugular venous distention. No thyroid enlargement, no tenderness.  LUNGS: Normal breath sounds bilaterally, no wheezing, rales,rhonchi or crepitation. No use of accessory muscles of respiration.  CARDIOVASCULAR: S1, S2 irregular. No murmurs, rubs, or gallops.  ABDOMEN: Soft, non-tender, non-distended. Bowel sounds present. No organomegaly or mass.  EXTREMITIES: No pedal edema, cyanosis, or clubbing. Right leg brace in place NEUROLOGIC: Cranial nerves II through XII are intact. Muscle strength 5/5 in all extremities. Sensation intact. Gait not checked.  PSYCHIATRIC: The patient is alert and oriented x1  SKIN: No obvious rash, lesion, or ulcer.   DATA REVIEW:   CBC  Recent Labs Lab 11/03/16 0434  WBC 9.5  HGB 9.3*  HCT 27.6*  PLT 262    Chemistries   Recent Labs Lab 10/31/16 0342  11/03/16 0434  NA 137  < > 142  K 4.2  < > 3.7  CL 109  < > 118*  CO2 20*  < > 20*  GLUCOSE 128*  < > 84  BUN 23*  < > 10  CREATININE 0.61  < > 0.36*  CALCIUM 9.0  < > 8.3*  AST 22  --   --   ALT 11*  --   --   ALKPHOS 55  --   --   BILITOT 1.9*  --   --   < > = values in this interval not displayed.  Cardiac Enzymes No results for input(s): TROPONINI in the last 168 hours.  Microbiology Results  Results for orders placed or performed during the hospital encounter of 10/31/16  Urine culture     Status: Abnormal   Collection Time: 10/31/16  4:01 AM  Result Value Ref Range Status    Specimen Description URINE, RANDOM  Final   Special Requests NONE  Final   Culture (A)  Final    >=  100,000 COLONIES/mL ESCHERICHIA COLI >=100,000 COLONIES/mL AEROCOCCUS URINAE    Report Status 11/04/2016 FINAL  Final   Organism ID, Bacteria ESCHERICHIA COLI (A)  Final      Susceptibility   Escherichia coli - MIC*    AMPICILLIN >=32 RESISTANT Resistant     CEFAZOLIN 16 SENSITIVE Sensitive     CEFTRIAXONE <=1 SENSITIVE Sensitive     CIPROFLOXACIN >=4 RESISTANT Resistant     GENTAMICIN <=1 SENSITIVE Sensitive     IMIPENEM <=0.25 SENSITIVE Sensitive     NITROFURANTOIN <=16 SENSITIVE Sensitive     TRIMETH/SULFA >=320 RESISTANT Resistant     AMPICILLIN/SULBACTAM >=32 RESISTANT Resistant     PIP/TAZO 8 SENSITIVE Sensitive     Extended ESBL NEGATIVE Sensitive     * >=100,000 COLONIES/mL ESCHERICHIA COLI  Blood Culture (routine x 2)     Status: None   Collection Time: 10/31/16  4:10 AM  Result Value Ref Range Status   Specimen Description BLOOD  LEFT WRIST  Final   Special Requests   Final    BOTTLES DRAWN AEROBIC AND ANAEROBIC  ANA 5ML AER 6ML   Culture NO GROWTH 5 DAYS  Final   Report Status 11/05/2016 FINAL  Final  Blood Culture (routine x 2)     Status: None   Collection Time: 10/31/16  4:13 AM  Result Value Ref Range Status   Specimen Description BLOOD  RIGHT WRIST   Final   Special Requests   Final    BOTTLES DRAWN AEROBIC AND ANAEROBIC  ANA 7ML AER 5ML   Culture NO GROWTH 5 DAYS  Final   Report Status 11/05/2016 FINAL  Final  MRSA PCR Screening     Status: None   Collection Time: 10/31/16 12:46 PM  Result Value Ref Range Status   MRSA by PCR NEGATIVE NEGATIVE Final    Comment:        The GeneXpert MRSA Assay (FDA approved for NASAL specimens only), is one component of a comprehensive MRSA colonization surveillance program. It is not intended to diagnose MRSA infection nor to guide or monitor treatment for MRSA infections.     RADIOLOGY:  No results  found.   Management plans discussed with the patient, family and they are in agreement.  CODE STATUS:     Code Status Orders        Start     Ordered   11/01/16 1019  Do not attempt resuscitation (DNR)  Continuous    Question Answer Comment  In the event of cardiac or respiratory ARREST Do not call a "code blue"   In the event of cardiac or respiratory ARREST Do not perform Intubation, CPR, defibrillation or ACLS   In the event of cardiac or respiratory ARREST Use medication by any route, position, wound care, and other measures to relive pain and suffering. May use oxygen, suction and manual treatment of airway obstruction as needed for comfort.      11/01/16 1018    Code Status History    Date Active Date Inactive Code Status Order ID Comments User Context   10/31/2016 11:44 AM 11/01/2016 10:18 AM Full Code [id number]  Saundra Shelling, MD Inpatient   03/29/2015  6:49 AM 04/01/2015  3:55 PM Full Code [id number]  Lavina Hamman, MD ED   02/14/2015  8:03 PM 02/15/2015  7:24 PM Full Code [id number]  Thurnell Lose, MD Inpatient    Advance Directive Documentation   Flowsheet Row Most Recent Value  Type of Advance Directive  Healthcare  Power of [employer] (son) /POA]  Pre-existing out of facility DNR order (yellow form or pink MOST form)  No data  "MOST" Form in Place?  No data      TOTAL TIME TAKING CARE OF THIS PATIENT: 40 minutes.    Conall Vangorder,  Karenann Cai.D on 11/05/2016 at 10:04 AM  Between 7am to 6pm - Pager - [phone]  After 6pm go to [email or web address] - Proofreader  [facility]  Office  [phone]  CC: Primary care physician; Kirk Ruths., MD

## 2016-11-05 NOTE — Progress Notes (Signed)
Chaplain was making his rounds and visited with pt in room 110. Provided the ministry of prayer and a pastoral presence.     11/05/16 1305  Clinical Encounter Type  Visited With Patient  Visit Type Initial;Spiritual support  Referral From Nurse  Spiritual Encounters  Spiritual Needs Prayer

## 2016-11-05 NOTE — NC FL2 (Signed)
[place] LEVEL OF CARE SCREENING TOOL     IDENTIFICATION  Patient Name: [name] Birthdate: Sep 30, 1941 Sex: female Admission Date (Current Location): 10/31/2016  [place] and [place] Number:  Selena Lesser [id number] [place] and Address:  [facility], [address], [place], [place] [address]      Provider Number: [id number]  Attending Physician Name and Address:  Lytle Butte, MD  Relative Name and Phone Number:  Rico Ala [phone]  [phone] or Gabriela Eves [phone] or Thomasene Mohair [phone]  or Fonnie Jarvis [phone]     Current Level of Care: Hospital Recommended Level of Care: [place] Prior Approval Number:    Date Approved/Denied:   PASRR Number: [id number]  Discharge Plan: SNF    Current Diagnoses: Patient Active Problem List   Diagnosis Date Noted  . Palliative care encounter   . Goals of care, counseling/discussion   . DNR (do not resuscitate) discussion   . Sepsis ([place]) 10/31/2016  . Pressure injury of skin 10/31/2016  . Allergic state 10/05/2016  . Arthritis 10/05/2016  . Glucose intolerance (impaired glucose tolerance) 10/05/2016  . History of gout 10/05/2016  . Hyperlipidemia, unspecified 10/05/2016  . Stroke ([place]) 10/05/2016  . Delirium due to another medical condition 07/09/2016  . 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 Fracture, tibial plateau 04/01/2015  . Fall   . Knee fracture, left 03/29/2015  . UTI (lower urinary tract infection) 03/29/2015  . Mental status, decreased 02/14/2015  . A-fib ([place]) 02/14/2015  . CVA (cerebral infarction) 02/14/2015  . GERD (gastroesophageal reflux disease) 02/14/2015  . Seizure ([place]) 02/14/2015  . Dementia 02/14/2015  . GERD without esophagitis 02/14/2015  . History of CVA (cerebrovascular accident)   . Long term current use of anticoagulant therapy 08/08/2011    Orientation RESPIRATION BLADDER Height &  Weight     Self, Place  Normal Incontinent Weight: 225 lb 6.4 oz (102.2 kg) Height:  5\' 4"  (162.6 cm)  BEHAVIORAL SYMPTOMS/MOOD NEUROLOGICAL BOWEL NUTRITION STATUS      Incontinent Diet (Cardiac)  AMBULATORY STATUS COMMUNICATION OF NEEDS Skin   Total Care Verbally PU Stage and Appropriate Care   PU Stage 2 Dressing:  (PRN Dressing Change)                   Personal Care Assistance Level of Assistance  Bathing, Feeding, Dressing Bathing Assistance: Maximum assistance Feeding assistance: Maximum assistance Dressing Assistance: Maximum assistance     Functional Limitations Info  Sight, Hearing, Speech Sight Info: Adequate Hearing Info: Adequate Speech Info: Adequate    SPECIAL CARE FACTORS FREQUENCY                       Contractures Contractures Info: Not present    Additional Factors Info  Code Status, Allergies Code Status Info: DNR Allergies Info: NKA           Current Medications (11/05/2016):  This is the current hospital active medication list Current Facility-Administered Medications  Medication Dose Route Frequency Provider Last Rate Last Dose  . acetaminophen (TYLENOL) tablet 650 mg  650 mg Oral Q6H PRN Saundra Shelling, MD   650 mg at 11/04/16 2032   Or  . acetaminophen (TYLENOL) suppository 650 mg  650 mg Rectal Q6H PRN Saundra Shelling, MD      . apixaban (ELIQUIS) tablet 5 mg  5 mg Oral BID Saundra Shelling, MD   5 mg at 11/05/16 0909  . atorvastatin (LIPITOR) tablet 10 mg  10 mg Oral q1800 Max Sane, MD   10 mg at 11/04/16 1833  . diltiazem (CARDIZEM) tablet 30 mg  30 mg Oral Q6H Vipul Shah, MD   30 mg at 11/05/16 0521  . donepezil (ARICEPT) tablet 10 mg  10 mg Oral QHS Max Sane, MD   10 mg at 11/04/16 2036  . feeding supplement (ENSURE ENLIVE) (ENSURE ENLIVE) liquid 237 mL  237 mL Oral BID BM Lance Coon, MD   237 mL at 11/05/16 1108  . feeding supplement (PRO-STAT 64) liquid 30 mL  30 mL Oral Daily Pavan Pyreddy, MD   30 mL at 11/04/16 1037  .  guaifenesin (ROBITUSSIN) 100 MG/5ML syrup 100 mg  100 mg Oral TID PRN Max Sane, MD      . ketotifen (ZADITOR) 0.025 % ophthalmic solution 1 drop  1 drop Both Eyes Daily PRN Saundra Shelling, MD      . levETIRAcetam (KEPPRA) tablet 500 mg  500 mg Oral BID Max Sane, MD   500 mg at 11/05/16 0908  . MEDLINE mouth rinse  15 mL Mouth Rinse BID Max Sane, MD   15 mL at 11/05/16 0909  . megestrol (MEGACE) tablet 40 mg  40 mg Oral Daily Max Sane, MD   40 mg at 11/05/16 0909  . memantine (NAMENDA) tablet 10 mg  10 mg Oral BID Saundra Shelling, MD   10 mg at 11/05/16 0909  . nystatin (MYCOSTATIN) 100000 UNIT/ML suspension 500,000 Units  5 mL Oral QID Lytle Butte, MD   500,000 Units at 11/05/16 0909  . ondansetron (ZOFRAN) tablet 4 mg  4 mg Oral Q6H PRN Saundra Shelling, MD       Or  . ondansetron (ZOFRAN) injection 4 mg  4 mg Intravenous Q6H PRN Pavan Pyreddy, MD      . pantoprazole (PROTONIX) EC tablet 40 mg  40 mg Oral Daily Saundra Shelling, MD   40 mg at 11/05/16 0908  . polyethylene glycol (MIRALAX / GLYCOLAX) packet 17 g  17 g Oral Daily Saundra Shelling, MD   17 g at 11/05/16 0909  . senna-docusate (Senokot-S) tablet 1 tablet  1 tablet Oral Daily Saundra Shelling, MD   1 tablet at 11/05/16 0909  . senna-docusate (Senokot-S) tablet 1 tablet  1 tablet Oral QHS PRN Saundra Shelling, MD   1 tablet at 11/02/16 2050  . traMADol (ULTRAM) tablet 50 mg  50 mg Oral Q6H PRN Max Sane, MD   50 mg at 11/05/16 0908  . vitamin C (ASCORBIC ACID) tablet 500 mg  500 mg Oral Daily Saundra Shelling, MD   500 mg at 11/05/16 0909     Discharge Medications: Please see discharge summary for a list of discharge medications.  Relevant Imaging Results:  Relevant Lab Results:   Additional Information SSN [id number]  Ross Ludwig, [place]

## 2016-11-05 NOTE — Clinical Social Work Note (Signed)
Patient to be d/c'ed today to [facility]  Patient and family agreeable to plans will transport via ems RN to call report to [phone].  Evette Cristal, MSW, LCSWA Mon-Fri 8a-4:30p [phone]

## 2016-11-05 NOTE — Care Management Note (Signed)
Case Management Note  Patient Details  Name: DANIA GADEN MRN: [id number] Date of Birth: 1941/01/01  Subjective/Objective:       Text to [place] per DC to [facility] today and that she has wound care orders written yesterday.              Action/Plan:   Expected Discharge Date:                  Expected Discharge Plan:     In-House Referral:     Discharge planning Services     Post Acute Care Choice:    Choice offered to:     DME Arranged:    DME Agency:     HH Arranged:    HH Agency:     Status of Service:     If discussed at [employer] of Stay Meetings, dates discussed:    Additional Comments:  Harvis Mabus A, RN 11/05/2016, 10:54 AM

## 2016-11-05 NOTE — Progress Notes (Signed)
Nutrition Follow-up  DOCUMENTATION CODES:   Obesity unspecified  INTERVENTION:  Continue Ensure Enlive po BID, each supplement provides 350 kcal and 20 grams of protein.  Continue Pro-Stat 30 ml daily, each supplement provides 100 kcal and 15 grams of protein.  NUTRITION DIAGNOSIS:   Predicted suboptimal nutrient intake related to lethargy/confusion as evidenced by other (see comment) (observation).  Ongoing.  GOAL:   Patient will meet greater than or equal to 90% of their needs  Not met.  MONITOR:   PO intake, I & O's, Labs, Weight trends, Supplement acceptance  REASON FOR ASSESSMENT:   Malnutrition Screening Tool    ASSESSMENT:   [name]  is a 75 y.o. female with a known history of Atrial fibrillation on antecolic patient with oral eliquis,, CVA, dementia, GERD, seizure disorder presented to the emergency room from [facility] facility for difficulty breathing and fever. Patient had fever of 101F  -Palliative Medicine met with patient on 12/15. Son made patient DNR and also discussed that he would not want a PEG tube for patient. Palliative Services will follow patient at nursing facility. -Patient now with discharge summary in - per note will d/c back to SNF.  Attempted to speak with patient at bedside. She was not responsive to RD. No family at bedside. Discussed with RN - patient refused breakfast. Per chart review, other RNs have charted patient having just bites/sips of meals/beverages.  Meal Completion: 0% to sips/bites at best.  Medications reviewed and include: ceftriaxone, Megace 40 mg daily, pantoprazole, Miralax, senna, vitamin C 500 mg daily, NS @ 125 ml/hr.  Patient now with known inadequate intake </=50% of estimated energy requirement for >/= 5 days. Patient's weight loss was over 4 months, so meets criteria for chronic malnutrition. Patient not meeting another criteria for chronic malnutrition or acute malnutrition at this time. However, she is  at high risk for developing malnutrition.  Labs reviewed: Chloride 118, CO2 20, Creatinine 0.36.   Diet Order:  Diet Heart Room service appropriate? Yes; Fluid consistency: Thin  Skin:  Wound (see comment) (Stg I to Heel, Stg II to Sacrum)  Last BM:  11/04/2016  Height:   Ht Readings from Last 1 Encounters:  10/31/16 5' 4"  (1.626 m)    Weight:   Wt Readings from Last 1 Encounters:  10/31/16 225 lb 6.4 oz (102.2 kg)    Ideal Body Weight:  54.54 kg  BMI:  Body mass index is 38.69 kg/m.  Estimated Nutritional Needs:   Kcal:  1537-1850 (MSJ x1-1.2)  Protein:  100-122 gm  Fluid:  >/= 1.5L  EDUCATION NEEDS:   No education needs identified at this time  Willey Blade, MS, RD, LDN Pager: [phone] After Hours Pager: [phone]

## 2016-11-05 NOTE — Progress Notes (Signed)
EMS called. Son, [name], notified patient is ready and waiting on EMS to transport patient back to [facility]

## 2016-11-05 NOTE — Progress Notes (Signed)
Patient is ready for discharge. Report called to [employer] at [facility] [phone]). Explained medication changes, multiple areas of skin damage, new brace for right leg, and son's request for low air mattress bed.

## 2016-12-26 ENCOUNTER — Encounter: Attending: Internal Medicine | Admitting: Internal Medicine

## 2016-12-26 DIAGNOSIS — L89152 Pressure ulcer of sacral region, stage 2: Secondary | ICD-10-CM | POA: Insufficient documentation

## 2016-12-26 DIAGNOSIS — Z8673 Personal history of transient ischemic attack (TIA), and cerebral infarction without residual deficits: Secondary | ICD-10-CM | POA: Diagnosis not present

## 2016-12-26 DIAGNOSIS — K219 Gastro-esophageal reflux disease without esophagitis: Secondary | ICD-10-CM | POA: Insufficient documentation

## 2016-12-26 DIAGNOSIS — G40909 Epilepsy, unspecified, not intractable, without status epilepticus: Secondary | ICD-10-CM | POA: Insufficient documentation

## 2016-12-26 DIAGNOSIS — I4891 Unspecified atrial fibrillation: Secondary | ICD-10-CM | POA: Insufficient documentation

## 2016-12-26 DIAGNOSIS — L89620 Pressure ulcer of left heel, unstageable: Secondary | ICD-10-CM | POA: Insufficient documentation

## 2016-12-26 DIAGNOSIS — M109 Gout, unspecified: Secondary | ICD-10-CM | POA: Diagnosis not present

## 2016-12-26 DIAGNOSIS — F0390 Unspecified dementia without behavioral disturbance: Secondary | ICD-10-CM | POA: Diagnosis not present

## 2016-12-26 DIAGNOSIS — L97213 Non-pressure chronic ulcer of right calf with necrosis of muscle: Secondary | ICD-10-CM | POA: Insufficient documentation

## 2016-12-27 NOTE — Progress Notes (Addendum)
SHAKEITA, KOZAL ([id number]) Visit Report for 12/26/2016 Chief Complaint Document Details Patient Name: [name], [name] Date of Service: 12/26/2016 8:00 AM Medical Record Patient Account Number: [id number] Number: Treating RN: Montey Hora 09-01-1941 (76 y.o. Other Clinician: Date of Birth/Sex: Female) Treating Kayliana Codd Primary Care Provider: Frazier Richards Provider/Extender: G Referring Provider: Radonna Ricker in Treatment: 0 Information Obtained from: Patient Chief Complaint 01/05/17; this is a 76 year old disabled woman from [facility] who is here for review of multiple wounds. She is accompanied by her son Engineer, maintenance) Signed: 12/26/2016 4:28:51 PM By: Linton Ham MD Entered By: Linton Ham on 12/26/2016 10:24:36 [name], [name] ([id number]) -------------------------------------------------------------------------------- Debridement Details Patient Name: Web, Leshawn B. Date of Service: 12/26/2016 8:00 AM Medical Record Patient Account Number: [id number] Number: Treating RN: Montey Hora 08-Mar-1941 (76 y.o. Other Clinician: Date of Birth/Sex: Female) Treating Sharice Harriss Primary Care Provider: Frazier Richards Provider/Extender: G Referring Provider: Radonna Ricker in Treatment: 0 Debridement Performed for Wound #2 Right,Proximal,Lateral Lower Leg Assessment: Performed By: Physician Ricard Dillon, MD Debridement: Debridement Pre-procedure Yes - 09:46 Verification/Time Out Taken: Start Time: 09:46 Pain Control: Lidocaine 4% Topical Solution Level: Skin/Subcutaneous Tissue Total Area Debrided (L x 2.5 (cm) x 2 (cm) = 5 (cm) W): Tissue and other Viable, Non-Viable, Eschar, Fibrin/Slough, Subcutaneous material debrided: Instrument: Blade, Curette, Forceps Bleeding: Minimum Hemostasis Achieved: Pressure End Time: 09:50 Procedural Pain: 0 Post Procedural Pain: 0 Response to Treatment: Procedure  was tolerated well Post Debridement Measurements of Total Wound Length: (cm) 2.5 Stage: Unstageable/Unclassified Width: (cm) 2 Depth: (cm) 0.7 Volume: (cm) 2.749 Character of Wound/Ulcer Post Improved Debridement: Severity of Tissue Post Muscle involvement without Debridement: necrosis Post Procedure Diagnosis Same as Pre-procedure Electronic Signature(s) Signed: 12/26/2016 4:28:51 PM By: Linton Ham MD [name], [name] ([id number]) Signed: 12/26/2016 5:09:18 PM By: Montey Hora Entered By: Linton Ham on 12/26/2016 10:12:14 [name], [name] ([id number]) -------------------------------------------------------------------------------- Debridement Details Patient Name: Ramberg, Albie B. Date of Service: 12/26/2016 8:00 AM Medical Record Patient Account Number: [id number] Number: Treating RN: Montey Hora 20-May-1941 (76 y.o. Other Clinician: Date of Birth/Sex: Female) Treating Sissy Goetzke Primary Care Provider: Frazier Richards Provider/Extender: G Referring Provider: Radonna Ricker in Treatment: 0 Debridement Performed for Wound #4 Right Sacrum Assessment: Performed By: Physician Ricard Dillon, MD Debridement: Debridement Pre-procedure Yes - 09:43 Verification/Time Out Taken: Start Time: 09:43 Pain Control: Lidocaine 4% Topical Solution Level: Skin/Subcutaneous Tissue Total Area Debrided (L x 4.5 (cm) x 4.5 (cm) = 20.25 (cm) W): Tissue and other Viable, Non-Viable, Fibrin/Slough, Subcutaneous material debrided: Instrument: Curette Bleeding: Minimum Hemostasis Achieved: Pressure End Time: 09:45 Procedural Pain: 0 Post Procedural Pain: 0 Response to Treatment: Procedure was tolerated well Post Debridement Measurements of Total Wound Length: (cm) 4.5 Stage: Category/Stage III Width: (cm) 4.5 Depth: (cm) 0.2 Volume: (cm) 3.181 Character of Wound/Ulcer Post Improved Debridement: Severity of Tissue Post Fat layer  exposed Debridement: Post Procedure Diagnosis Same as Pre-procedure Electronic Signature(s) Signed: 12/26/2016 4:28:51 PM By: Linton Ham MD [name], [name] ([id number]) Signed: 12/26/2016 5:09:18 PM By: Montey Hora Entered By: Linton Ham on 12/26/2016 10:12:39 [name], [name] ([id number]) -------------------------------------------------------------------------------- HPI Details Patient Name: [name], [name] Date of Service: 12/26/2016 8:00 AM Medical Record Patient Account Number: [id number] Number: Treating RN: Montey Hora 1941/08/19 (76 y.o. Other Clinician: Date of Birth/Sex: Female) Treating Elvin Banker Primary Care Provider: Frazier Richards Provider/Extender: G Referring Provider: Radonna Ricker in Treatment: 0 History of Present Illness HPI Description: 01/05/17; this is a  76 year old woman who is from [facility] skilled facility. She is accompanied by her son who provides most of the history. He tells me he was called in early December to report a fall in the shower room at the facility. An x-ray showed a right supracondylar distal femur fracture. She was put in a brace and treated nonoperatively. She was also admitted to hospital in mid December and discharged on 12/18. During this admission she had sepsis secondary to an acute UTI. In reviewing [facility] she is also quoted during that admission as having a left ischial tuberosity wound coccyx wound and a deep tissue injury over the left heel. Her son states that sometime after this and when she returned to see her orthopedic surgeon she developed wound areas under the brace on the right lateral leg have both distally and a smaller wound superiorly on the lateral right leg. I am not completely certain what the facility is placing on these wounds. The patient has a history of atrial fibrillation. Her son tells me that years ago she had a fairly significant stroke but maintained a  good quality of life up until 2 years ago when she developed dementia then things have really been going downhill since. She is been at a nursing facility for the last 18 months [[facility]] barely her nutritional intake is marginal but improving. Her son states that he is there and a lot of meals and she eats 75%. She is receiving nutritional supplements as well as protein supplements she has a level II pressure-relief surface and bunny boots bilaterally on both feet Electronic Signature(s) Signed: 12/26/2016 4:28:51 PM By: Linton Ham MD Entered By: Linton Ham on 12/26/2016 10:28:35 [name], [name] ([id number]) -------------------------------------------------------------------------------- Physical Exam Details Patient Name: [name], [name] Date of Service: 12/26/2016 8:00 AM Medical Record Patient Account Number: [id number] Number: Treating RN: Montey Hora 09-Nov-1941 (76 y.o. Other Clinician: Date of Birth/Sex: Female) Treating Cyenna Rebello Primary Care Provider: Frazier Richards Provider/Extender: G Referring Provider: Radonna Ricker in Treatment: 0 Constitutional Sitting or standing Blood Pressure is within target range for patient.. Pulse regular and within target range for patient.Marland Kitchen Respirations regular, non-labored and within target range.. Temperature is normal and within the target range for the patient.. Asian does not appear to be in any distress. Today she was nonverbal although the son states she can't. Eyes Conjunctivae clear. No discharge.. Ears, Nose, Mouth, and Throat Could not visualize in her oral cavity. Respiratory Respiratory effort is easy and symmetric bilaterally. Rate is normal at rest and on room air.. Bilateral breath sounds are clear and equal in all lobes with no wheezes, rales or rhonchi.. Cardiovascular Heart sounds are regular compatible with known A. fib no murmurs no gallops she appears to be euvolemic. Dorsalis  pedis pulses are palpable bilaterally. Gastrointestinal (GI) Abdomen is soft and non-distended without masses or tenderness. Bowel sounds active in all quadrants.. No liver or spleen enlargement or tenderness.. Lymphatic None palpable in the popliteal or inguinal area. Psychiatric No obvious mood disorder I suspect advanced dementia question multi-infarct. Notes Wound exam; the patient has 4 major wound areas; #1 over her coccyx and left buttock a stage III pressure ulcer. This was debrided with a #5 curet of nonviable surface tissue hemostasis direct pressure #2 on her left heel is the DTI that was initially described during the hospitalization in December this has a thick black surface eschar therefore is unstageable. The eschar was crosshatched #3 on the lateral aspect of her  right leg there is a large area from the ankle superiorly. The upper portion of this is 75% exposed thick tendon or there is no evidence of surrounding infection. Lower area on this is unstageable. #4 on the lateral upper aspect of the right leg again a unstageable area which required debridement with a #3 curet of necrotic surface tissue. Again no evidence of surrounding infection hemostasis with direct pressure Stills, Amina B. ([id number]) Electronic Signature(s) Signed: 12/26/2016 4:28:51 PM By: Linton Ham MD Entered By: Linton Ham on 12/26/2016 10:32:57 Brothers, [name] ([id number]) -------------------------------------------------------------------------------- Physician Orders Details Patient Name: Flagg, Daleen B. Date of Service: 12/26/2016 8:00 AM Medical Record Patient Account Number: [id number] Number: Treating RN: Cornell Barman [birth year] (76 y.o. Other Clinician: Date of Birth/Sex: Female) Treating Eddy Liszewski Primary Care Provider: Frazier Richards Provider/Extender: G Referring Provider: Radonna Ricker in Treatment: 0 Verbal / Phone Orders: Yes Clinician: Cornell Barman Read Back and Verified: Yes Diagnosis Coding ICD-10 Coding Code Description L89.620 Pressure ulcer of left heel, unstageable L89.152 Pressure ulcer of sacral region, stage 2 L97.213 Non-pressure chronic ulcer of right calf with necrosis of muscle Wound Cleansing Wound #1 Right,Distal,Lateral Lower Leg o Clean wound with Normal Saline. o Cleanse wound with mild soap and water Wound #2 Right,Proximal,Lateral Lower Leg o Clean wound with Normal Saline. o Cleanse wound with mild soap and water Wound #3 Left Calcaneus o Clean wound with Normal Saline. o Cleanse wound with mild soap and water Wound #4 Right Sacrum o Clean wound with Normal Saline. o Cleanse wound with mild soap and water Anesthetic Wound #1 Right,Distal,Lateral Lower Leg o Topical Lidocaine 4% cream applied to wound bed prior to debridement Wound #2 Right,Proximal,Lateral Lower Leg o Topical Lidocaine 4% cream applied to wound bed prior to debridement Wound #3 Left Calcaneus o Topical Lidocaine 4% cream applied to wound bed prior to debridement Wound #4 Right Sacrum o Topical Lidocaine 4% cream applied to wound bed prior to debridement [name], [name] ([id number]) Primary Wound Dressing Wound #1 Right,Distal,Lateral Lower Leg o Hydrogel - cover tendon with hydrogel o Prisma Ag - or collagen with silver equivalent Wound #2 Right,Proximal,Lateral Lower Leg o Prisma Ag - or collagen with silver equivalent Wound #3 Left Calcaneus o Santyl Ointment Wound #4 Right Sacrum o Prisma Ag Secondary Dressing Wound #1 Right,Distal,Lateral Lower Leg o Gauze, ABD and Kerlix/Conform - secure with coban - do not wrap tight Wound #2 Right,Proximal,Lateral Lower Leg o Gauze, ABD and Kerlix/Conform - secure with coban - do not wrap tight Wound #3 Left Calcaneus o Dry Gauze o Boardered Foam Dressing Wound #4 Right Sacrum o Dry Gauze o Boardered Foam Dressing Dressing Change  Frequency Wound #1 Right,Distal,Lateral Lower Leg o Change dressing every day. Wound #2 Right,Proximal,Lateral Lower Leg o Change dressing every day. Wound #3 Left Calcaneus o Change dressing every other day. Wound #4 Right Sacrum o Change dressing every other day. Follow-up Appointments Wound #1 Right,Distal,Lateral Lower Leg o Return Appointment in 1 week. Wound #2 Right,Proximal,Lateral Lower Leg Arquette, Janasia B. ([id number]) o Return Appointment in 1 week. Wound #3 Left Calcaneus o Return Appointment in 1 week. Wound #4 Right Sacrum o Return Appointment in 1 week. Off-Loading Wound #1 Right,Distal,Lateral Lower Leg o Turn and reposition every 2 hours Wound #2 Right,Proximal,Lateral Lower Leg o Turn and reposition every 2 hours Wound #3 Left Calcaneus o Turn and reposition every 2 hours Wound #4 Right Sacrum o Turn and reposition every 2 hours Additional Orders / Instructions Wound #  1 Right,Distal,Lateral Lower Leg o Increase protein intake. o Other: - please add vitamin c, vitamin a and zinc supplements to patient's diet Wound #2 Right,Proximal,Lateral Lower Leg o Increase protein intake. o Other: - please add vitamin c, vitamin a and zinc supplements to patient's diet Wound #3 Left Calcaneus o Increase protein intake. o Other: - please add vitamin c, vitamin a and zinc supplements to patient's diet Wound #4 Right Sacrum o Increase protein intake. o Other: - please add vitamin c, vitamin a and zinc supplements to patient's diet Medications-please add to medication list. Wound #3 Left Calcaneus o Santyl Enzymatic Ointment - Please provide this for patient's wound Electronic Signature(s) Signed: 12/26/2016 4:28:51 PM By: Linton Ham MD Signed: 12/26/2016 5:09:18 PM By: Montey Hora Entered By: Montey Hora on 12/26/2016 10:04:44 [name], [name] ([id number]) [name], [name]  ([id number]) -------------------------------------------------------------------------------- Problem List Details Patient Name: [name], [name] Date of Service: 12/26/2016 8:00 AM Medical Record Patient Account Number: [id number] Number: Treating RN: Montey Hora Jun 21, 1941 (76 y.o. 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 Date of Service: 12/26/2016 8:00 AM Medical Record Patient Account Number: [id number] Number: Treating RN: Montey Hora [birth year] (76 y.o. Other Clinician: Date of Birth/Sex: Female) Treating Nguyen Butler Primary Care Provider: Frazier Richards Provider/Extender: G Referring Provider: Radonna Ricker in Treatment: 0 Subjective Chief Complaint Information obtained from Patient 01/05/17; this is a 76 year old disabled woman from [facility] who is here for review of multiple wounds. She is accompanied by her son History of Present Illness (HPI) 01/05/17; this is a 76 year old woman who is from [facility] skilled facility. She is accompanied by her son who provides most of the history. He tells me he was called in early December to report a fall in the shower room at the  facility. An x-ray showed a right supracondylar distal femur fracture. She was put in a brace and treated nonoperatively. She was also admitted to hospital in mid December and discharged on 12/18. During this admission she had sepsis secondary to an acute UTI. In reviewing [facility] she is also quoted during that admission as having a left ischial tuberosity wound coccyx wound and a deep tissue injury over the left heel. Her son states that sometime after this and when she returned to see her orthopedic surgeon she developed wound areas under the brace on the right lateral leg have both distally and a smaller wound superiorly on the lateral right leg. I am not completely certain what the facility is placing on these wounds. The patient has a history of atrial fibrillation. Her son tells me that years ago she had a fairly significant stroke but maintained a good quality of life up until 2 years ago when she developed dementia then things have really been going downhill since. She is been at a nursing facility for the last 18 months [[facility]] barely her nutritional intake is marginal but improving. Her son states that he is  there and a lot of meals and she eats 75%. She is receiving nutritional supplements as well as protein supplements she has a level II pressure-relief surface and bunny boots bilaterally on both feet Patient History Information obtained from Patient. Allergies No Known Allergies Social History Never smoker, Marital Status - Widowed, Alcohol Use - Never, Drug Use - No History, Caffeine Use - [place], Gayleen B. ([id number]) Never. Medical History Eyes Patient has history of Cataracts Cardiovascular Patient has history of Arrhythmia - a fib Integumentary (Skin) Patient has history of History of pressure wounds Musculoskeletal Patient has history of Gout Neurologic Patient has history of Seizure Disorder - hx seizure Oncologic Denies history of Received  Chemotherapy, Received Radiation Hospitalization/Surgery History - 10/31/2016, [facility], sepsis UTI. Medical And Surgical History Notes Cardiovascular hyperlipidemia, CVA 216 Gastrointestinal GERD Genitourinary recurrent uti, colovaginal fistula Review of Systems (ROS) Constitutional Symptoms (General Health) The patient has no complaints or symptoms. Ear/Nose/Mouth/Throat The patient has no complaints or symptoms. Hematologic/Lymphatic The patient has no complaints or symptoms. Respiratory The patient has no complaints or symptoms. Cardiovascular The patient has no complaints or symptoms. Gastrointestinal The patient has no complaints or symptoms. Endocrine The patient has no complaints or symptoms. Genitourinary The patient has no complaints or symptoms. Immunological The patient has no complaints or symptoms. Integumentary (Skin) Complains or has symptoms of Wounds. Oncologic The patient has no complaints or symptoms. Psychiatric The patient has no complaints or symptoms. Kuhlman, Marquesha B. ([id number]) Objective Constitutional Sitting or standing Blood Pressure is within target range for patient.. Pulse regular and within target range for patient.Marland Kitchen Respirations regular, non-labored and within target range.. Temperature is normal and within the target range for the patient.. Asian does not appear to be in any distress. Today she was nonverbal although the son states she can't. Vitals Time Taken: 8:16 AM, Temperature: 98.5 F, Pulse: 159 bpm, Respiratory Rate: 16 breaths/min, Blood Pressure: 100/72 mmHg. Eyes Conjunctivae clear. No discharge.. Ears, Nose, Mouth, and Throat Could not visualize in her oral cavity. Respiratory Respiratory effort is easy and symmetric bilaterally. Rate is normal at rest and on room air.. Bilateral breath sounds are clear and equal in all lobes with no wheezes, rales or rhonchi.. Cardiovascular Heart sounds are regular compatible with known  A. fib no murmurs no gallops she appears to be euvolemic. Dorsalis pedis pulses are palpable bilaterally. Gastrointestinal (GI) Abdomen is soft and non-distended without masses or tenderness. Bowel sounds active in all quadrants.. No liver or spleen enlargement or tenderness.. Lymphatic None palpable in the popliteal or inguinal area. Psychiatric No obvious mood disorder I suspect advanced dementia question multi-infarct. General Notes: Wound exam; the patient has 4 major wound areas; #1 over her coccyx and left buttock a stage III pressure ulcer. This was debrided with a #5 curet of nonviable surface tissue hemostasis direct pressure #2 on her left heel is the DTI that was initially described during the hospitalization in December this has a thick black surface eschar therefore is unstageable. The eschar was crosshatched #3 on the lateral aspect of her right leg there is a large area from the ankle superiorly. The upper portion of this is 75% exposed thick tendon or there is no evidence of surrounding infection. Lower area on this is unstageable. #4 on the lateral upper aspect of the right leg again a unstageable area which required debridement with a #3 [name], [name] ([id number]) curet of necrotic surface tissue. Again no evidence of surrounding infection hemostasis with direct pressure Integumentary (Hair, Skin)  Wound #1 status is Open. Original cause of wound was Pressure Injury. The wound is located on the Right,Distal,Lateral Lower Leg. The wound measures 14cm length x 4cm width x 0.5cm depth; 43.982cm^2 area and 21.991cm^3 volume. There is tendon exposed. There is no tunneling or undermining noted. There is a large amount of serous drainage noted. The wound margin is flat and intact. There is medium (34-66%) red granulation within the wound bed. There is a medium (34-66%) amount of necrotic tissue within the wound bed including Eschar. The periwound skin appearance did not exhibit:  Callus, Crepitus, Excoriation, Induration, Rash, Scarring, Dry/Scaly, Maceration, Atrophie Blanche, Cyanosis, Ecchymosis, Hemosiderin Staining, Mottled, Pallor, Rubor, Erythema. Periwound temperature was noted as No Abnormality. The periwound has tenderness on palpation. Wound #2 status is Open. Original cause of wound was Pressure Injury. The wound is located on the Right,Proximal,Lateral Lower Leg. The wound measures 2.5cm length x 2cm width x 0.1cm depth; 3.927cm^2 area and 0.393cm^3 volume. The wound is limited to skin breakdown. There is no tunneling or undermining noted. There is a medium amount of serous drainage noted. The wound margin is flat and intact. There is no granulation within the wound bed. There is a large (67-100%) amount of necrotic tissue within the wound bed including Eschar and Adherent Slough. The periwound skin appearance did not exhibit: Callus, Crepitus, Excoriation, Induration, Rash, Scarring, Dry/Scaly, Maceration, Atrophie Blanche, Cyanosis, Ecchymosis, Hemosiderin Staining, Mottled, Pallor, Rubor, Erythema. Periwound temperature was noted as No Abnormality. The periwound has tenderness on palpation. Wound #3 status is Open. Original cause of wound was Pressure Injury. The wound is located on the Left Calcaneus. The wound measures 4cm length x 4.5cm width x 0.1cm depth; 14.137cm^2 area and 1.414cm^3 volume. The wound is limited to skin breakdown. There is no tunneling or undermining noted. There is a none present amount of drainage noted. The wound margin is flat and intact. There is no granulation within the wound bed. There is a large (67-100%) amount of necrotic tissue within the wound bed including Eschar. The periwound skin appearance did not exhibit: Callus, Crepitus, Excoriation, Induration, Rash, Scarring, Dry/Scaly, Maceration, Atrophie Blanche, Cyanosis, Ecchymosis, Hemosiderin Staining, Mottled, Pallor, Rubor, Erythema. Periwound temperature was noted as  No Abnormality. Wound #4 status is Open. Original cause of wound was Pressure Injury. The wound is located on the Right Sacrum. The wound measures 4.5cm length x 4.5cm width x 0.1cm depth; 15.904cm^2 area and 1.59cm^3 volume. The wound is limited to skin breakdown. There is no tunneling or undermining noted. There is a large amount of serous drainage noted. The wound margin is flat and intact. There is large (67-100%) pink granulation within the wound bed. There is a small (1-33%) amount of necrotic tissue within the wound bed including Adherent Slough. The periwound skin appearance did not exhibit: Callus, Crepitus, Excoriation, Induration, Rash, Scarring, Dry/Scaly, Maceration, Atrophie Blanche, Cyanosis, Ecchymosis, Hemosiderin Staining, Mottled, Pallor, Rubor, Erythema. Periwound temperature was noted as No Abnormality. The periwound has tenderness on palpation. Assessment Active Problems ICD-10 Sumlin, Dayanira B. 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 There was a Skin/Subcutaneous Tissue Debridement [id number] debridement with total area of 5 sq cm performed by Ricard Dillon, MD. with the following instrument(s): Blade, Curette, and Forceps to remove Viable and Non-Viable tissue/material including  Fibrin/Slough, Eschar, and Subcutaneous after achieving pain control using Lidocaine 4% Topical Solution. A time out was conducted at 09:46, prior to the start of the procedure. A Minimum amount of bleeding was controlled with Pressure. The procedure was tolerated well with a pain level of 0 throughout and a pain level of 0 following the procedure. Post Debridement Measurements: 2.5cm length x 2cm width x 0.7cm depth; 2.749cm^3 volume. Post  debridement Stage noted as Unstageable/Unclassified. Character of Wound/Ulcer Post Debridement is improved. Severity of Tissue Post Debridement is: Muscle involvement without necrosis. Post procedure Diagnosis Wound #2: Same as Pre-Procedure Wound #4 Wound #4 is a Pressure Ulcer located on the Right Sacrum . There was a Skin/Subcutaneous Tissue Debridement [id number] debridement with total area of 20.25 sq cm performed by Ricard Dillon, MD. with the following instrument(s): Curette to remove Viable and Non-Viable tissue/material including Fibrin/Slough and Subcutaneous after achieving pain control using Lidocaine 4% Topical Solution. A time out was conducted at 09:43, prior to the start of the procedure. A Minimum amount of bleeding was controlled with Pressure. The procedure was tolerated well with a pain level of 0 throughout and a pain level of 0 following the procedure. Post Debridement Measurements: 4.5cm length x 4.5cm width x 0.2cm depth; 3.181cm^3 volume. Post debridement Stage noted as Category/Stage III. Character of Wound/Ulcer Post Debridement is improved. Severity of Tissue Post Debridement is: Fat layer exposed. Post procedure Diagnosis Wound #4: Same as Pre-Procedure Plan Wound Cleansing: Wound #1 Right,Distal,Lateral Lower Leg: Clean wound with Normal Saline. Cleanse wound with mild soap and water Mineer, Kemonie B. ([id number]) Wound #2 Right,Proximal,Lateral Lower Leg: Clean wound with Normal Saline. Cleanse wound with mild soap and water Wound #3 Left Calcaneus: Clean wound with Normal Saline. Cleanse wound with mild soap and water Wound #4 Right Sacrum: Clean wound with Normal Saline. Cleanse wound with mild soap and water Anesthetic: Wound #1 Right,Distal,Lateral Lower Leg: Topical Lidocaine 4% cream applied to wound bed prior to debridement Wound #2 Right,Proximal,Lateral Lower Leg: Topical Lidocaine 4% cream applied to wound bed prior to  debridement Wound #3 Left Calcaneus: Topical Lidocaine 4% cream applied to wound bed prior to debridement Wound #4 Right Sacrum: Topical Lidocaine 4% cream applied to wound bed prior to debridement Primary Wound Dressing: Wound #1 Right,Distal,Lateral Lower Leg: Hydrogel - cover tendon with hydrogel Prisma Ag - or collagen with silver equivalent Wound #2 Right,Proximal,Lateral Lower Leg: Prisma Ag - or collagen with silver equivalent Wound #3 Left Calcaneus: Santyl Ointment Wound #4 Right Sacrum: Prisma Ag Secondary Dressing: Wound #1 Right,Distal,Lateral Lower Leg: Gauze, ABD and Kerlix/Conform - secure with coban - do not wrap tight Wound #2 Right,Proximal,Lateral Lower Leg: Gauze, ABD and Kerlix/Conform - secure with coban - do not wrap tight Wound #3 Left Calcaneus: Dry Gauze Boardered Foam Dressing Wound #4 Right Sacrum: Dry Gauze Boardered Foam Dressing Dressing Change Frequency: Wound #1 Right,Distal,Lateral Lower Leg: Change dressing every day. Wound #2 Right,Proximal,Lateral Lower Leg: Change dressing every day. Wound #3 Left Calcaneus: Change dressing every other day. Wound #4 Right Sacrum: Change dressing every other day. Follow-up Appointments: Wound #1 Right,Distal,Lateral Lower Leg: Return Appointment in 1 week. Palmisano, Zyria B. ([id number]) Wound #2 Right,Proximal,Lateral Lower Leg: Return Appointment in 1 week. Wound #3 Left Calcaneus: Return Appointment in 1 week. Wound #4 Right Sacrum: Return Appointment in 1 week. Off-Loading: Wound #1 Right,Distal,Lateral Lower Leg: Turn and reposition every 2 hours Wound #2 Right,Proximal,Lateral Lower Leg: Turn and reposition every 2 hours Wound #3 Left Calcaneus: Turn  and reposition every 2 hours Wound #4 Right Sacrum: Turn and reposition every 2 hours Additional Orders / Instructions: Wound #1 Right,Distal,Lateral Lower Leg: Increase protein intake. Other: - please add vitamin c, vitamin a and zinc  supplements to patient's diet Wound #2 Right,Proximal,Lateral Lower Leg: Increase protein intake. Other: - please add vitamin c, vitamin a and zinc supplements to patient's diet Wound #3 Left Calcaneus: Increase protein intake. Other: - please add vitamin c, vitamin a and zinc supplements to patient's diet Wound #4 Right Sacrum: Increase protein intake. Other: - please add vitamin c, vitamin a and zinc supplements to patient's diet Medications-please add to medication list.: Wound #3 Left Calcaneus: Santyl Enzymatic Ointment - Please provide this for patient's wound #1 the left heel we applied Santyl and foam #2 to the coccyx stage III Prisma foam #3 to the right lower lateral leg Prisma moist gauze #4 to the right upper leg Prisma #5 Kerlix and light Covan to the right leg which will be changed daily #6 other wounds can be changed every second day #6 we discussed preventative measures including turning and nutrition with her son JAZZLYN, BORGIA B. ([id number]) #7 area with exposed tendon would need either operative closure i.e. plastic surgery or perhaps a trip to the OR for placement of a graft jacket or Integra. At that the patient is on hospice and I urged her son to discuss the hospice planning care vis--vis the aggressiveness of her wound care although for now he seemed adamant on wanting an aggressive posture therefore the debridement site did today and the frequency of the dressing changes. I will attempt to talk to him about this next week. 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 Electronic Signature(s) Signed: 12/31/2016 4:28:57 PM By: Gretta Cool RN, BSN, Kim RN, BSN Signed: 01/02/2017 7:47:55 AM By: Linton Ham MD Previous Signature: 12/31/2016 4:28:29 PM Version By: Gretta Cool RN, BSN, Kim RN, BSN Previous Signature: 12/26/2016 4:28:51 PM Version By: Linton Ham MD Entered By: Gretta Cool RN, BSN, Kim on 12/31/2016 16:28:56 Duffey, [name]  ([id number]) -------------------------------------------------------------------------------- ROS/PFSH Details Patient Name: Regino, Brendy B. Date of Service: 12/26/2016 8:00 AM Medical Record Patient Account Number: [id number] Number: Treating RN: Montey Hora [birth year] (76 y.o. Other Clinician: Date of Birth/Sex: Female) Treating James Senn Primary Care Provider: Frazier Richards Provider/Extender: G Referring Provider: Radonna Ricker in Treatment: 0 Information Obtained From Patient Wound History Do you currently have one or more open woundso Yes Integumentary (Skin) Complaints and Symptoms: Positive for: Wounds Medical History: Positive for: History of pressure wounds Constitutional Symptoms (General Health) Complaints and Symptoms: No Complaints or Symptoms Eyes Medical History: Positive for: Cataracts Ear/Nose/Mouth/Throat Complaints and Symptoms: No Complaints or Symptoms Hematologic/Lymphatic Complaints and Symptoms: No Complaints or Symptoms Respiratory Complaints and Symptoms: No Complaints or Symptoms Cardiovascular Complaints and Symptoms: No Complaints or Symptoms Heidelberg, Kamila B. ([id number]) Medical History: Positive for: Arrhythmia - a fib Past Medical History Notes: hyperlipidemia, CVA 216 Gastrointestinal Complaints and Symptoms: No Complaints or Symptoms Medical History: Past Medical History Notes: GERD Endocrine Complaints and Symptoms: No Complaints or Symptoms Genitourinary Complaints and Symptoms: No Complaints or Symptoms Medical History: Past Medical History Notes: recurrent uti, colovaginal fistula Immunological Complaints and Symptoms: No Complaints or Symptoms Musculoskeletal Medical History: Positive for: Gout Neurologic Medical History: Positive for: Seizure Disorder - hx seizure Oncologic Complaints and Symptoms: No Complaints or Symptoms Medical History: Negative for: Received Chemotherapy;  Received Radiation Tayler, Janaya B. ([id number]) Psychiatric Complaints and Symptoms: No Complaints or  Symptoms HBO Extended History Items Eyes: Cataracts Immunizations Pneumococcal Vaccine: Received Pneumococcal Vaccination: Yes Immunization Notes: up to date Hospitalization / Surgery History Name of Hospital Purpose of Hospitalization/Surgery Date [facility] sepsis UTI 10/31/2016 Family and Social History Never smoker; Marital Status - Widowed; Alcohol Use: Never; Drug Use: No History; Caffeine Use: Never; Financial Concerns: No; Food, Clothing or Shelter Needs: No; Support System Lacking: No; Transportation Concerns: No; Medical Power of Attorney: Yes - Gala Lewandowsky Electronic Signature(s) Signed: 12/26/2016 4:28:51 PM By: Linton Ham MD Signed: 12/26/2016 5:09:18 PM By: Montey Hora Entered By: Montey Hora on 12/26/2016 08:34:57 Cromwell, [name] ([id number]) -------------------------------------------------------------------------------- [place] Details Patient Name: Tadros, Zophia B. Date of Service: 12/26/2016 Medical Record Patient Account Number: [id number] Number: Treating RN: Montey Hora 04/12/1941 (76 y.o. Other Clinician: Date of Birth/Sex: Female) Treating Lesley Atkin Primary Care Provider: Frazier Richards Provider/Extender: G Referring Provider: Skip Estimable Service Line: Weeks in Treatment: 0 Diagnosis Coding ICD-10 Codes Code Description [phone] Pressure ulcer of left heel, unstageable L89.152 Pressure ulcer of sacral region, stage 2 L97.213 Non-pressure chronic ulcer of right calf with necrosis of muscle Facility Procedures CPT4 Code Description: [id number] - WOUND CARE VISIT-LEV 3 EST PT Modifier: Quantity: 1 CPT4 Code Description: [id number] - DEB SUBQ TISSUE 20 SQ CM/< ICD-10 Description Diagnosis L89.620 Pressure ulcer of left heel, unstageable L89.152 Pressure ulcer of sacral region, stage 2 L97.213 Non-pressure chronic  ulcer of right calf with necro Modifier: sis of muscl Quantity: 1 e CPT4 Code Description: [id number] 11045 - DEB SUBQ TISS EA ADDL 20CM ICD-10 Description Diagnosis L89.152 Pressure ulcer of sacral region, stage 2 Modifier: Quantity: 1 Physician Procedures CPT4 Code Description: [id number] - WC PHYS LEVEL 4 - NEW PT ICD-10 Description Diagnosis L89.620 Pressure ulcer of left heel, unstageable L89.152 Pressure ulcer of sacral region, stage 2 L97.213 Non-pressure chronic ulcer of right calf with necro Modifier: sis of muscle Quantity: 1 CPT4 Code Description: [id number] - WC PHYS SUBQ TISS 20 SQ CM Cancel, Keyuna B. ([id number]) Modifier: Quantity: 1 Electronic Signature(s) Signed: 12/26/2016 4:28:51 PM By: Linton Ham MD Entered By: Linton Ham on 12/26/2016 10:36:59

## 2016-12-27 NOTE — Progress Notes (Signed)
[name], [name] ([id number]) Visit Report for 12/26/2016 Allergy List Details Patient Name: [name], [name] Date of Service: 12/26/2016 8:00 AM Medical Record Patient Account Number: [id number] Number: Treating RN: Montey Hora 06-10-1941 (75 y.o. Other Clinician: Date of Birth/Sex: Female) Treating ROBSON, MICHAEL Primary Care Nehal Witting: Frazier Richards Brexlee Heberlein/Extender: G Referring Akili Cuda: Radonna Ricker in Treatment: 0 Allergies Active Allergies No Known Allergies Allergy Notes Electronic Signature(s) Signed: 12/26/2016 5:09:18 PM By: Montey Hora Entered By: Montey Hora on 12/26/2016 08:29:45 Timmers, Raul Del ([id number]) -------------------------------------------------------------------------------- Arrival Information Details Patient Name: [name], [name] Date of Service: 12/26/2016 8:00 AM Medical Record Patient Account Number: [id number] Number: Treating RN: Montey Hora 12-04-1940 (75 y.o. Other Clinician: Date of Birth/Sex: Female) Treating ROBSON, MICHAEL Primary Care Sophia Cubero: Frazier Richards Jasin Brazel/Extender: G Referring Maleia Weems: Radonna Ricker in Treatment: 0 Visit Information Patient Arrived: Wheel Chair Arrival Time: 08:12 Accompanied By: son Transfer Assistance: Civil Service fast streamer Patient Identification Yes Verified: Secondary Verification Yes Process Completed: Patient Has Alerts: Yes Patient Alerts: Patient on Blood Thinner eliquis NO ABI R/T WOUND LOCATION Electronic Signature(s) Signed: 12/26/2016 9:31:21 AM By: Montey Hora Entered By: Montey Hora on 12/26/2016 09:31:21 Grifton, Raul Del ([id number]) -------------------------------------------------------------------------------- Clinic Level of Care Assessment Details Patient Name: [name], [name] Date of Service: 12/26/2016 8:00 AM Medical Record Patient Account Number: [id number] Number: Treating RN: Cornell Barman 08/12/1941 (75 y.o. Other  Clinician: Date of Birth/Sex: Female) Treating ROBSON, MICHAEL Primary Care Cadden Elizondo: Frazier Richards Naseem Varden/Extender: G Referring Dontrelle Mazon: Radonna Ricker in Treatment: 0 Clinic Level of Care Assessment Items TOOL 1 Quantity Score []  - Use when EandM and Procedure is performed on INITIAL visit 0 ASSESSMENTS - Nursing Assessment / Reassessment X - General Physical Exam (combine w/ comprehensive assessment (listed just 1 20 below) when performed on new pt. evals) X - Comprehensive Assessment (HX, ROS, Risk Assessments, Wounds Hx, etc.) 1 25 ASSESSMENTS - Wound and Skin Assessment / Reassessment []  - Dermatologic / Skin Assessment (not related to wound area) 0 ASSESSMENTS - Ostomy and/or Continence Assessment and Care X - Incontinence Assessment and Management 1 10 []  - Ostomy Care Assessment and Management (repouching, etc.) 0 PROCESS - Coordination of Care X - Simple Patient / Family Education for ongoing care 1 15 []  - Complex (extensive) Patient / Family Education for ongoing care 0 []  - Staff obtains Programmer, systems, Records, Test Results / Process Orders 0 []  - Staff telephones HHA, Nursing Homes / Clarify orders / etc 0 []  - Routine Transfer to another Facility (non-emergent condition) 0 []  - Routine Hospital Admission (non-emergent condition) 0 X - New Admissions / Biomedical engineer / Ordering NPWT, Apligraf, etc. 1 15 []  - Emergency Hospital Admission (emergent condition) 0 PROCESS - Special Needs []  - Pediatric / Minor Patient Management 0 Jarmon, James B. ([id number]) []  - Isolation Patient Management 0 []  - Hearing / Language / Visual special needs 0 []  - Assessment of Community assistance (transportation, D/C planning, etc.) 0 X - Additional assistance / Altered mentation 1 15 []  - Support Surface(s) Assessment (bed, cushion, seat, etc.) 0 INTERVENTIONS - Miscellaneous []  - External ear exam 0 []  - Patient Transfer (multiple staff / Civil Service fast streamer / Similar  devices) 0 []  - Simple Staple / Suture removal (25 or less) 0 []  - Complex Staple / Suture removal (26 or more) 0 []  - Hypo/Hyperglycemic Management (do not check if billed separately) 0 []  - Ankle / Brachial Index (ABI) - do not check if billed separately 0 Has the patient  been seen at the hospital within the last three years: Yes Total Score: 100 Level Of Care: New/Established - Level 3 Electronic Signature(s) Signed: 12/26/2016 5:04:45 PM By: Gretta Cool, RN, BSN, Kim RN, BSN Entered By: Gretta Cool, RN, BSN, Kim on 12/26/2016 09:47:50 Schommer, Raul Del ([id number]) -------------------------------------------------------------------------------- Encounter Discharge Information Details Patient Name: [name], [name] Date of Service: 12/26/2016 8:00 AM Medical Record Patient Account Number: [id number] Number: Treating RN: Cornell Barman May 27, 1941 (75 y.o. Other Clinician: Date of Birth/Sex: Female) Treating ROBSON, MICHAEL Primary Care Jeremia Groot: Frazier Richards Manju Kulkarni/Extender: G Referring Caliya Narine: Radonna Ricker in Treatment: 0 Encounter Discharge Information Items Discharge Pain Level: 0 Discharge Condition: Stable Ambulatory Status: Wheelchair Discharge Destination: Nursing Home Transportation: Private Auto Accompanied By: son Schedule Follow-up Appointment: Yes Medication Reconciliation completed No and provided to Patient/Care Dawnita Molner: Provided on Clinical Summary of Care: 12/26/2016 Form Type Recipient Paper Patient EM Electronic Signature(s) Signed: 12/26/2016 10:35:57 AM By: Ruthine Dose Entered By: Ruthine Dose on 12/26/2016 10:35:56 Whetstine, Raul Del ([id number]) -------------------------------------------------------------------------------- Lower Extremity Assessment Details Patient Name: [name], [name] Date of Service: 12/26/2016 8:00 AM Medical Record Patient Account Number: [id number] Number: Treating RN: Montey Hora [birth year] (75 y.o. Other  Clinician: Date of Birth/Sex: Female) Treating ROBSON, MICHAEL Primary Care Jeraline Marcinek: Frazier Richards Zelma Mazariego/Extender: G Referring Gabrianna Fassnacht: Radonna Ricker in Treatment: 0 Edema Assessment Assessed: [Left: No] [Right: No] Edema: [Left: N] [Right: o] Calf Left: Right: Point of Measurement: 34 cm From Medial Instep cm 31 cm Ankle Left: Right: Point of Measurement: 10 cm From Medial Instep cm 23.5 cm Vascular Assessment Pulses: Dorsalis Pedis Palpable: [Right:Yes] Doppler Audible: [Right:Yes] Posterior Tibial Palpable: [Right:Yes] Doppler Audible: [Right:Yes] Extremity colors, hair growth, and conditions: Extremity Color: [Right:Normal] Hair Growth on Extremity: [Right:No] Temperature of Extremity: [Right:Cool] Capillary Refill: [Right:< 3 seconds] Toe Nail Assessment Left: Right: Thick: Yes Discolored: Yes Deformed: No Improper Length and Hygiene: No Notes Hargrove, Ophia B. ([id number]) NO ABI R/T PAIN AND WOUND LOCATION Electronic Signature(s) Signed: 12/26/2016 5:09:18 PM By: Montey Hora Entered By: Montey Hora on 12/26/2016 08:50:26 Weinkauf, Raul Del ([id number]) -------------------------------------------------------------------------------- Multi Wound Chart Details Patient Name: Nham, Christal B. Date of Service: 12/26/2016 8:00 AM Medical Record Patient Account Number: [id number] Number: Treating RN: Montey Hora 1940-12-04 (75 y.o. Other Clinician: Date of Birth/Sex: Female) Treating ROBSON, MICHAEL Primary Care Rynell Ciotti: Frazier Richards Analiza Cowger/Extender: G Referring Ashauna Bertholf: Radonna Ricker in Treatment: 0 Vital Signs Height(in): Pulse(bpm): 159 Weight(lbs): Blood Pressure 100/72 (mmHg): Body Mass Index(BMI): Temperature(F): 98.5 Respiratory Rate 16 (breaths/min): Photos: [1:No Photos] [2:No Photos] [3:No Photos] Wound Location: [1:Right Lower Leg - Lateral, Distal] [2:Right Lower Leg - Lateral, Proximal] [3:Left  Calcaneus] Wounding Event: [1:Pressure Injury] [2:Pressure Injury] [3:Pressure Injury] Primary Etiology: [1:Pressure Ulcer] [2:Pressure Ulcer] [3:Pressure Ulcer] Comorbid History: [1:Cataracts, Arrhythmia, History of pressure wounds, Gout, Seizure Disorder] [2:Cataracts, Arrhythmia, History of pressure wounds, Gout, Seizure Disorder] [3:Cataracts, Arrhythmia, History of pressure wounds, Gout, Seizure Disorder] Date Acquired: [1:11/07/2016] [2:11/07/2016] [3:10/30/2016] Weeks of Treatment: [1:0] [2:0] [3:0] Wound Status: [1:Open] [2:Open] [3:Open] Measurements L x W x D 14x4x0.5 [2:2.5x2x0.1] [3:4x4.5x0.1] (cm) Area (cm) : [1:43.982] [2:3.927] [3:14.137] Volume (cm) : [1:21.991] [2:0.393] [3:1.414] % Reduction in Area: [1:N/A] [2:N/A] [3:N/A] % Reduction in Volume: N/A [2:N/A] [3:N/A] Classification: [1:Category/Stage IV] [2:Unstageable/Unclassified] [3:Unstageable/Unclassified] Exudate Amount: [1:Large] [2:Medium] [3:None Present] Exudate Type: [1:Serous] [2:Serous] [3:N/A] Exudate Color: [1:amber] [2:amber] [3:N/A] Foul Odor After [1:Yes] [2:Yes] [3:No] Cleansing: Odor Anticipated Due to No [2:No] [3:N/A] Product Use: Wound Margin: [1:Flat and Intact] [2:Flat and Intact] [3:Flat and Intact] Granulation  Amount: [1:Medium (34-66%)] [2:None Present (0%)] [3:None Present (0%)] Granulation Quality: Red N/A N/A Necrotic Amount: Medium (34-66%) Large (67-100%) Large (67-100%) Necrotic Tissue: Eschar Eschar, Adherent Slough Eschar Exposed Structures: Tendon: Yes Fascia: No Fascia: No Fascia: No Fat Layer (Subcutaneous Fat Layer (Subcutaneous Fat Layer (Subcutaneous Tissue) Exposed: No Tissue) Exposed: No Tissue) Exposed: No Tendon: No Tendon: No Muscle: No Muscle: No Muscle: No Joint: No Joint: No Joint: No Bone: No Bone: No Bone: No Limited to Skin Limited to Skin Breakdown Breakdown Epithelialization: None None Large (67-100%) Debridement: N/A Debridement [id number]  N/A 11047) Pre-procedure N/A 09:46 N/A Verification/Time Out Taken: Pain Control: N/A Lidocaine 4% Topical N/A Solution Tissue Debrided: N/A Necrotic/Eschar, N/A Fibrin/Slough, Subcutaneous Level: N/A Skin/Subcutaneous N/A Tissue Debridement Area (sq N/A 5 N/A cm): Instrument: N/A Blade, Curette, Forceps N/A Bleeding: N/A Minimum N/A Hemostasis Achieved: N/A Pressure N/A Procedural Pain: N/A 0 N/A Post Procedural Pain: N/A 0 N/A Debridement Treatment N/A Procedure was tolerated N/A Response: well Post Debridement N/A 2.5x2x0.7 N/A Measurements L x W x D (cm) Post Debridement N/A 2.749 N/A Volume: (cm) Post Debridement N/A Unstageable/Unclassified N/A Stage: Periwound Skin Texture: Excoriation: No Excoriation: No Excoriation: No Induration: No Induration: No Induration: No Callus: No Callus: No Callus: No Crepitus: No Crepitus: No Crepitus: No Rash: No Rash: No Rash: No Scarring: No Scarring: No Scarring: No Periwound Skin Maceration: No Maceration: No Maceration: No Moisture: Dry/Scaly: No Dry/Scaly: No Dry/Scaly: No Periwound Skin Color: [place], Rozann B. ([id number]) Atrophie Blanche: No Atrophie Blanche: No Atrophie Blanche: No Cyanosis: No Cyanosis: No Cyanosis: No Ecchymosis: No Ecchymosis: No Ecchymosis: No Erythema: No Erythema: No Erythema: No Hemosiderin Staining: No Hemosiderin Staining: No Hemosiderin Staining: No Mottled: No Mottled: No Mottled: No Pallor: No Pallor: No Pallor: No Rubor: No Rubor: No Rubor: No Temperature: No Abnormality No Abnormality No Abnormality Tenderness on Yes Yes No Palpation: Wound Preparation: Ulcer Cleansing: Ulcer Cleansing: Ulcer Cleansing: Rinsed/Irrigated with Rinsed/Irrigated with Rinsed/Irrigated with Saline Saline Saline Topical Anesthetic Topical Anesthetic Topical Anesthetic Applied: Other: lidocaine Applied: Other: lidocaine Applied: None 4% 4% Procedures Performed: N/A  Debridement N/A Wound Number: 4 N/A N/A Photos: No Photos N/A N/A Wound Location: Right Sacrum N/A N/A Wounding Event: Pressure Injury N/A N/A Primary Etiology: Pressure Ulcer N/A N/A Comorbid History: Cataracts, Arrhythmia, N/A N/A History of pressure wounds, Gout, Seizure Disorder Date Acquired: 11/26/2016 N/A N/A Weeks of Treatment: 0 N/A N/A Wound Status: Open N/A N/A Measurements L x W x D 4.5x4.5x0.1 N/A N/A (cm) Area (cm) : 15.904 N/A N/A Volume (cm) : 1.59 N/A N/A % Reduction in Area: 0.00% N/A N/A % Reduction in Volume: 0.00% N/A N/A Classification: Category/Stage III N/A N/A Exudate Amount: Large N/A N/A Exudate Type: Serous N/A N/A Exudate Color: amber N/A N/A Foul Odor After No N/A N/A Cleansing: Odor Anticipated Due to N/A N/A N/A Product Use: Wound Margin: Flat and Intact N/A N/A Granulation Amount: Large (67-100%) N/A N/A Granulation Quality: Pink N/A N/A Froelich, Azrael B. ([id number]) Necrotic Amount: Small (1-33%) N/A N/A Necrotic Tissue: Adherent Slough N/A N/A Exposed Structures: Fascia: No N/A N/A Fat Layer (Subcutaneous Tissue) Exposed: No Tendon: No Muscle: No Joint: No Bone: No Limited to Skin Breakdown Epithelialization: None N/A N/A Debridement: Debridement [id number] N/A N/A 11047) Pre-procedure 09:43 N/A N/A Verification/Time Out Taken: Pain Control: Lidocaine 4% Topical N/A N/A Solution Tissue Debrided: Fibrin/Slough, N/A N/A Subcutaneous Level: Skin/Subcutaneous N/A N/A Tissue Debridement Area (sq 20.25 N/A N/A cm): Instrument: Curette N/A N/A Bleeding: Minimum N/A N/A Hemostasis Achieved:  Pressure N/A N/A Procedural Pain: 0 N/A N/A Post Procedural Pain: 0 N/A N/A Debridement Treatment Procedure was tolerated N/A N/A Response: well Post Debridement 4.5x4.5x0.2 N/A N/A Measurements L x W x D (cm) Post Debridement 3.181 N/A N/A Volume: (cm) Post Debridement Category/Stage III N/A N/A Stage: Periwound Skin Texture:  Excoriation: No N/A N/A Induration: No Callus: No Crepitus: No Rash: No Scarring: No Periwound Skin Maceration: No N/A N/A Moisture: Dry/Scaly: No Periwound Skin Color: Atrophie Blanche: No N/A N/A Cyanosis: No Ecchymosis: No Cragg, Dominga B. ([id number]) Erythema: No Hemosiderin Staining: No Mottled: No Pallor: No Rubor: No Temperature: No Abnormality N/A N/A Tenderness on Yes N/A N/A Palpation: Wound Preparation: Ulcer Cleansing: N/A N/A Rinsed/Irrigated with Saline Topical Anesthetic Applied: Other: lidocaine 4% Procedures Performed: Debridement N/A N/A Treatment Notes Electronic Signature(s) Signed: 12/26/2016 4:28:51 PM By: Linton Ham MD Entered By: Linton Ham on 12/26/2016 10:11:24 [place], Raul Del ([id number]) -------------------------------------------------------------------------------- [place] Details Patient Name: Mohammad, Silva B. Date of Service: 12/26/2016 8:00 AM Medical Record Patient Account Number: [id number] Number: Treating RN: Cornell Barman [birth year] (75 y.o. Other Clinician: Date of Birth/Sex: Female) Treating ROBSON, MICHAEL Primary Care Asra Gambrel: Frazier Richards Okechukwu Regnier/Extender: G Referring Marcianne Ozbun: Radonna Ricker in Treatment: 0 Active Inactive ` Abuse / Safety / Falls / Self Care Management Nursing Diagnoses: Impaired physical mobility Goals: Patient will remain injury free Date Initiated: 12/26/2016 Target Resolution Date: 03/22/2017 Goal Status: Active Interventions: Assess fall risk on admission and as needed Notes: ` Orientation to the Wound Care Program Nursing Diagnoses: Knowledge deficit related to the wound healing center program Goals: Patient/caregiver will verbalize understanding of the [place] Date Initiated: 12/26/2016 Target Resolution Date: 03/22/2017 Goal Status: Active Interventions: Provide education on orientation to the wound  center Notes: ` Pressure Nursing Diagnoses: Asleson, Letisha B. ([id number]) Potential for impaired tissue integrity related to pressure, friction, moisture, and shear Goals: Patient will remain free from development of additional pressure ulcers Date Initiated: 12/26/2016 Target Resolution Date: 03/22/2017 Goal Status: Active Interventions: Assess potential for pressure ulcer upon admission and as needed Notes: ` Wound/Skin Impairment Nursing Diagnoses: Impaired tissue integrity Goals: Patient/caregiver will verbalize understanding of skin care regimen Date Initiated: 12/26/2016 Target Resolution Date: 03/22/2017 Goal Status: Active Ulcer/skin breakdown will have a volume reduction of 30% by week 4 Date Initiated: 12/26/2016 Target Resolution Date: 03/22/2017 Goal Status: Active Ulcer/skin breakdown will have a volume reduction of 50% by week 8 Date Initiated: 12/26/2016 Target Resolution Date: 03/22/2017 Goal Status: Active Ulcer/skin breakdown will have a volume reduction of 80% by week 12 Date Initiated: 12/26/2016 Target Resolution Date: 03/22/2017 Goal Status: Active Ulcer/skin breakdown will heal within 14 weeks Date Initiated: 12/26/2016 Target Resolution Date: 03/22/2017 Goal Status: Active Interventions: Assess patient/caregiver ability to obtain necessary supplies Assess patient/caregiver ability to perform ulcer/skin care regimen upon admission and as needed Assess ulceration(s) every visit Notes: Electronic Signature(s) Signed: 12/26/2016 5:04:45 PM By: Gretta Cool, RN, BSN, Kim RN, BSN Entered By: Gretta Cool, RN, BSN, Kim on 12/26/2016 [place], Raul Del ([id number]) [place], Raul Del ([id number]) -------------------------------------------------------------------------------- Pain Assessment Details Patient Name: Kundrat, Amalya B. Date of Service: 12/26/2016 8:00 AM Medical Record Patient Account Number: [id number] Number: Treating RN: Montey Hora 1941/08/22 (75 y.o. Other  Clinician: Date of Birth/Sex: Female) Treating ROBSON, MICHAEL Primary Care Korayma Hagwood: Frazier Richards Karyna Bessler/Extender: G Referring Shanah Guimaraes: Radonna Ricker in Treatment: 0 Active Problems Location of Pain Severity and Description of Pain Patient Has Paino Patient Unable to Respond Site Locations Pain Management  and Medication Current Pain Management: Notes Topical or injectable lidocaine is offered to patient for acute pain when surgical debridement is performed. If needed, Patient is instructed to use over the counter pain medication for the following 24-48 hours after debridement. Wound care MDs do not prescribed pain medications. Patient has chronic pain or uncontrolled pain. Patient has been instructed to make an appointment with their Primary Care Physician for pain management. Electronic Signature(s) Signed: 12/26/2016 5:09:18 PM By: Montey Hora Entered By: Montey Hora on 12/26/2016 08:16:02 [place], Raul Del ([id number]) -------------------------------------------------------------------------------- Patient/Caregiver Education Details Patient Name: Fyock, Deonne B. Date of Service: 12/26/2016 8:00 AM Medical Record Patient Account Number: [id number] Number: Treating RN: Cornell Barman 21-Feb-1941 (75 y.o. Other Clinician: Date of Birth/Gender: Female) Treating ROBSON, [place] Primary Care Physician: Frazier Richards Physician/Extender: G Referring Physician: Radonna Ricker in Treatment: 0 Education Assessment Education Provided To: Caregiver Education Topics Provided Wound/Skin Impairment: Handouts: Other: wound care as ordered Methods: Demonstration, Explain/Verbal, Printed Responses: State content correctly Notes son educated and written orders sent to facility with patient and son Electronic Signature(s) Signed: 12/26/2016 5:04:45 PM By: Gretta Cool, RN, BSN, Kim RN, BSN Entered By: Gretta Cool, RN, BSN, Kim on 12/26/2016 09:38:30 [place], Raul Del  ([id number]) -------------------------------------------------------------------------------- Wound Assessment Details Patient Name: Danielson, Adeja B. Date of Service: 12/26/2016 8:00 AM Medical Record Patient Account Number: [id number] Number: Treating RN: Montey Hora 10-06-1941 (75 y.o. Other Clinician: Date of Birth/Sex: Female) Treating ROBSON, MICHAEL Primary Care Menachem Urbanek: Frazier Richards Sabina Beavers/Extender: G Referring Carrieann Spielberg: Radonna Ricker in Treatment: 0 Wound Status Wound Number: 1 Primary Pressure Ulcer Etiology: Wound Location: Right Lower Leg - Lateral, Distal Wound Open Status: Wounding Event: Pressure Injury Comorbid Cataracts, Arrhythmia, History of Date Acquired: 11/07/2016 History: pressure wounds, Gout, Seizure Weeks Of Treatment: 0 Disorder Clustered Wound: No Photos Wound Measurements Length: (cm) 14 Width: (cm) 4 Depth: (cm) 0.5 Area: (cm) 43.982 Volume: (cm) 21.991 % Reduction in Area: 0% % Reduction in Volume: 0% Epithelialization: None Tunneling: No Undermining: No Wound Description Classification: Category/Stage IV Foul Odor After Wound Margin: Flat and Intact Due to Product U Exudate Amount: Large Slough/Fibrino Exudate Type: Serous Exudate Color: amber Cleansing: Yes se: No Yes Wound Bed Granulation Amount: Medium (34-66%) Exposed Structure Granulation Quality: Red Fascia Exposed: No Necrotic Amount: Medium (34-66%) Fat Layer (Subcutaneous Tissue) Exposed: No Wachtel, Christain B. ([id number]) Necrotic Quality: Eschar Tendon Exposed: Yes Muscle Exposed: No Joint Exposed: No Bone Exposed: No Periwound Skin Texture Texture Color No Abnormalities Noted: No No Abnormalities Noted: No Callus: No Atrophie Blanche: No Crepitus: No Cyanosis: No Excoriation: No Ecchymosis: No Induration: No Erythema: No Rash: No Hemosiderin Staining: No Scarring: No Mottled: No Pallor: No Moisture Rubor: No No  Abnormalities Noted: No Dry / Scaly: No Temperature / Pain Maceration: No Temperature: No Abnormality Tenderness on Palpation: Yes Wound Preparation Ulcer Cleansing: Rinsed/Irrigated with Saline Topical Anesthetic Applied: Other: lidocaine 4%, Treatment Notes Wound #1 (Right, Distal, Lateral Lower Leg) 1. Cleansed with: Clean wound with Normal Saline 2. Anesthetic Topical Lidocaine 4% cream to wound bed prior to debridement 4. Dressing Applied: Hydrogel Prisma Ag 5. Secondary Dressing Applied Guaze, ABD and kerlix/Conform 7. Secured with Other (specify in notes) Notes kerlix and coban wrap Electronic Signature(s) Signed: 12/26/2016 5:09:18 PM By: Montey Hora Entered By: Montey Hora on 12/26/2016 12:53:08 [place], Raul Del ([id number]) -------------------------------------------------------------------------------- Wound Assessment Details Patient Name: Tregre, Shonda B. Date of Service: 12/26/2016 8:00 AM Medical Record Patient Account Number: [id number] Number: Treating RN: Montey Hora 1941/05/15 (  76 y.o. Other Clinician: Date of Birth/Sex: Female) Treating ROBSON, MICHAEL Primary Care Alisandra Son: Frazier Richards Jenene Kauffmann/Extender: G Referring Tierre Gerard: Radonna Ricker in Treatment: 0 Wound Status Wound Number: 2 Primary Pressure Ulcer Etiology: Wound Location: Right Lower Leg - Lateral, Proximal Wound Open Status: Wounding Event: Pressure Injury Comorbid Cataracts, Arrhythmia, History of Date Acquired: 11/07/2016 History: pressure wounds, Gout, Seizure Weeks Of Treatment: 0 Disorder Clustered Wound: No Photos Wound Measurements Length: (cm) 2.5 Width: (cm) 2 Depth: (cm) 0.1 Area: (cm) 3.927 Volume: (cm) 0.393 % Reduction in Area: 0% % Reduction in Volume: 0% Epithelialization: None Tunneling: No Undermining: No Wound Description Classification: Unstageable/Unclassified Wound Margin: Flat and Intact Exudate Amount:  Medium Exudate Type: Serous Exudate Color: amber Foul Odor After Cleansing: Yes Due to Product Use: No Slough/Fibrino Yes Wound Bed Granulation Amount: None Present (0%) Exposed Structure Necrotic Amount: Large (67-100%) Fascia Exposed: No Necrotic Quality: Eschar, Adherent Slough Fat Layer (Subcutaneous Tissue) Exposed: No Mussell, Bethany B. ([id number]) Tendon Exposed: No Muscle Exposed: No Joint Exposed: No Bone Exposed: No Limited to Skin Breakdown Periwound Skin Texture Texture Color No Abnormalities Noted: No No Abnormalities Noted: No Callus: No Atrophie Blanche: No Crepitus: No Cyanosis: No Excoriation: No Ecchymosis: No Induration: No Erythema: No Rash: No Hemosiderin Staining: No Scarring: No Mottled: No Pallor: No Moisture Rubor: No No Abnormalities Noted: No Dry / Scaly: No Temperature / Pain Maceration: No Temperature: No Abnormality Tenderness on Palpation: Yes Wound Preparation Ulcer Cleansing: Rinsed/Irrigated with Saline Topical Anesthetic Applied: Other: lidocaine 4%, Treatment Notes Wound #2 (Right, Proximal, Lateral Lower Leg) 1. Cleansed with: Clean wound with Normal Saline 2. Anesthetic Topical Lidocaine 4% cream to wound bed prior to debridement 4. Dressing Applied: Hydrogel Prisma Ag 5. Secondary Dressing Applied Guaze, ABD and kerlix/Conform 7. Secured with Other (specify in notes) Notes kerlix and coban wrap Electronic Signature(s) Signed: 12/26/2016 5:09:18 PM By: Montey Hora Entered By: Montey Hora on 12/26/2016 12:53:26 Gariepy, Raul Del ([id number]) -------------------------------------------------------------------------------- Wound Assessment Details Patient Name: Fitzmaurice, Koby B. Date of Service: 12/26/2016 8:00 AM Medical Record Patient Account Number: [id number] Number: Treating RN: Cornell Barman 1941-01-09 (75 y.o. Other Clinician: Date of Birth/Sex: Female) Treating ROBSON, MICHAEL Primary Care Pasquale Matters:  Frazier Richards Theo Krumholz/Extender: G Referring Addilynne Olheiser: Radonna Ricker in Treatment: 0 Wound Status Wound Number: 3 Primary Pressure Ulcer Etiology: Wound Location: Left Calcaneus Wound Open Wounding Event: Pressure Injury Status: Date Acquired: 10/30/2016 Comorbid Cataracts, Arrhythmia, History of Weeks Of Treatment: 0 History: pressure wounds, Gout, Seizure Clustered Wound: No Disorder Photos Wound Measurements Length: (cm) 4 Width: (cm) 4.5 Depth: (cm) 0.1 Area: (cm) 14.137 Volume: (cm) 1.414 % Reduction in Area: 0% % Reduction in Volume: 0% Epithelialization: Large (67-100%) Tunneling: No Undermining: No Wound Description Classification: Unstageable/Unclassified Foul Odor After Wound Margin: Flat and Intact Slough/Fibrino Exudate Amount: None Present Cleansing: No No Wound Bed Granulation Amount: None Present (0%) Exposed Structure Necrotic Amount: Large (67-100%) Fascia Exposed: No Necrotic Quality: Eschar Fat Layer (Subcutaneous Tissue) Exposed: No Tendon Exposed: No Muscle Exposed: No Cumbie, Emalina B. ([id number]) Joint Exposed: No Bone Exposed: No Limited to Skin Breakdown Periwound Skin Texture Texture Color No Abnormalities Noted: No No Abnormalities Noted: No Callus: No Atrophie Blanche: No Crepitus: No Cyanosis: No Excoriation: No Ecchymosis: No Induration: No Erythema: No Rash: No Hemosiderin Staining: No Scarring: No Mottled: No Pallor: No Moisture Rubor: No No Abnormalities Noted: No Dry / Scaly: No Temperature / Pain Maceration: No Temperature: No Abnormality Wound Preparation Ulcer Cleansing: Rinsed/Irrigated with Saline Topical Anesthetic  Applied: None Treatment Notes Wound #3 (Left Calcaneus) 1. Cleansed with: Clean wound with Normal Saline 4. Dressing Applied: Santyl Ointment 5. Secondary Dressing Applied Bordered Foam Dressing Dry Gauze Kerlix/Conform Electronic Signature(s) Signed: 12/26/2016 5:04:45 PM  By: Gretta Cool RN, BSN, Kim RN, BSN Signed: 12/26/2016 5:09:18 PM By: Montey Hora Entered By: Montey Hora on 12/26/2016 12:53:49 Derosia, Raul Del ([id number]) -------------------------------------------------------------------------------- Wound Assessment Details Patient Name: Janeway, Glora B. Date of Service: 12/26/2016 8:00 AM Medical Record Patient Account Number: [id number] Number: Treating RN: Cornell Barman 10-20-1941 (75 y.o. Other Clinician: Date of Birth/Sex: Female) Treating ROBSON, MICHAEL Primary Care Lathaniel Legate: Frazier Richards Claiborne Stroble/Extender: G Referring Sebrena Engh: Radonna Ricker in Treatment: 0 Wound Status Wound Number: 4 Primary Pressure Ulcer Etiology: Wound Location: Right Sacrum Wound Open Wounding Event: Pressure Injury Status: Date Acquired: 11/26/2016 Comorbid Cataracts, Arrhythmia, History of Weeks Of Treatment: 0 History: pressure wounds, Gout, Seizure Clustered Wound: No Disorder Photos Wound Measurements Length: (cm) 4.5 % Reduction in Area: Width: (cm) 4.5 % Reduction in Volum Depth: (cm) 0.1 Epithelialization: Area: (cm) 15.904 Tunneling: Volume: (cm) 1.59 Undermining: 0% e: 0% None No No Wound Description Classification: Category/Stage III Foul Odor After Clea Wound Margin: Flat and Intact Slough/Fibrino Exudate Amount: Large Exudate Type: Serous Exudate Color: amber nsing: No No Wound Bed Granulation Amount: Large (67-100%) Exposed Structure Granulation Quality: Pink Fascia Exposed: No Necrotic Amount: Small (1-33%) Fat Layer (Subcutaneous Tissue) Exposed: No Kyte, Danyale B. ([id number]) Necrotic Quality: Adherent Slough Tendon Exposed: No Muscle Exposed: No Joint Exposed: No Bone Exposed: No Limited to Skin Breakdown Periwound Skin Texture Texture Color No Abnormalities Noted: No No Abnormalities Noted: No Callus: No Atrophie Blanche: No Crepitus: No Cyanosis: No Excoriation: No Ecchymosis:  No Induration: No Erythema: No Rash: No Hemosiderin Staining: No Scarring: No Mottled: No Pallor: No Moisture Rubor: No No Abnormalities Noted: No Dry / Scaly: No Temperature / Pain Maceration: No Temperature: No Abnormality Tenderness on Palpation: Yes Wound Preparation Ulcer Cleansing: Rinsed/Irrigated with Saline Topical Anesthetic Applied: Other: lidocaine 4%, Treatment Notes Wound #4 (Right Sacrum) 1. Cleansed with: Clean wound with Normal Saline 2. Anesthetic Topical Lidocaine 4% cream to wound bed prior to debridement 4. Dressing Applied: Prisma Ag 5. Secondary Dressing Applied Bordered Foam Dressing Dry Gauze Electronic Signature(s) Signed: 12/26/2016 5:04:45 PM By: Gretta Cool RN, BSN, Kim RN, BSN Signed: 12/26/2016 5:09:18 PM By: Montey Hora Entered By: Montey Hora on 12/26/2016 12:54:11 Kroeger, Raul Del ([id number]) -------------------------------------------------------------------------------- [place] Details Patient Name: Haese, Trella B. Date of Service: 12/26/2016 8:00 AM Medical Record Patient Account Number: [id number] Number: Treating RN: Montey Hora August 13, 1941 (75 y.o. Other Clinician: Date of Birth/Sex: Female) Treating ROBSON, MICHAEL Primary Care Arlee Santosuosso: Frazier Richards Gergory Biello/Extender: G Referring Jamari Diana: Radonna Ricker in Treatment: 0 Vital Signs Time Taken: 08:16 Temperature (F): 98.5 Pulse (bpm): 159 Respiratory Rate (breaths/min): 16 Blood Pressure (mmHg): 100/72 Reference Range: 80 - 120 mg / dl Electronic Signature(s) Signed: 12/26/2016 5:09:18 PM By: Montey Hora Entered By: Montey Hora on 12/26/2016 08:17:06

## 2016-12-27 NOTE — Progress Notes (Signed)
DEEKSHA, MCLELAND ([id number]) Visit Report for 12/26/2016 Abuse/Suicide Risk Screen Details Patient Name: Stettner, Valarie B. Date of Service: 12/26/2016 8:00 AM Medical Record Patient Account Number: [id number] Number: Treating RN: Montey Hora 07-Aug-1941 (75 y.o. Other Clinician: Date of Birth/Sex: Female) Treating ROBSON, MICHAEL Primary Care Myquan [place]umb[place]: Frazier Richards Kaspian Muccio/Extender: G Referring Shakelia Scrivner: Radonna Ricker in Treatment: 0 Abuse/Suicide Risk Screen Items Answer ABUSE/SUICIDE RISK SCREEN: Has anyone close to you tried to hurt or harm you recentlyo No Do you feel uncomfortable with anyone in your familyo No Has anyone forced you do things that you didnot want to doo No Do you have any thoughts of harming yourselfo No Patient displays signs or symptoms of abuse and/or neglect. No Electronic Signature(s) Signed: 12/26/2016 5:09:18 PM By: Montey Hora Entered By: Montey Hora on 12/26/2016 08:35:04 Degrazia, Raul Del ([id number]) -------------------------------------------------------------------------------- Activities of Daily Living Details Patient Name: Muscat, Cabrina B. Date of Service: 12/26/2016 8:00 AM Medical Record Patient Account Number: [id number] Number: Treating RN: Montey Hora January 05, 1941 (75 y.o. Other Clinician: Date of Birth/Sex: Female) Treating ROBSON, MICHAEL Primary Care [place]: Frazier Richards Shanell Aden/Extender: G Referring Clarabelle Oscarson: Radonna Ricker in Treatment: 0 Activities of Daily Living Items Answer Activities of Daily Living (Please select one for each item) Drive Automobile Not Able Take Medications Need Assistance Use Telephone Not Able Care for Appearance Need Assistance Use Toilet Need Assistance Bath / Shower Need Assistance Dress Self Need Assistance Feed Self Need Assistance Walk Not Able Get In / Out Bed Need Assistance Housework Not Able Prepare Meals Not Able Handle Money Not Able Shop  for Self Not Able Electronic Signature(s) Signed: 12/26/2016 5:09:18 PM By: Montey Hora Entered By: Montey Hora on 12/26/2016 08:35:46 Skaff, Raul Del ([id number]) -------------------------------------------------------------------------------- Education Assessment Details Patient Name: Jun, Jesenya B. Date of Service: 12/26/2016 8:00 AM Medical Record Patient Account Number: [id number] Number: Treating RN: Montey Hora 06/02/1941 (75 y.o. Other Clinician: Date of Birth/Sex: Female) Treating ROBSON, MICHAEL Primary Care Hollyann Pablo: Frazier Richards Billie Intriago/Extender: G Referring Charlsey Moragne: Radonna Ricker in Treatment: 0 Primary Learner Assessed: Caregiver Reason Patient is not Primary Learner: dementia Learning Preferences/Education Level/Primary Language Learning Preference: Explanation, Demonstration Highest Education Level: College or Above Preferred Language: English Cognitive Barrier Assessment/Beliefs Language Barrier: No Translator Needed: No Memory Deficit: No Emotional Barrier: No Cultural/Religious Beliefs Affecting Medical No Care: Physical Barrier Assessment Impaired Vision: No Impaired Hearing: No Decreased Hand dexterity: No Knowledge/Comprehension Assessment Knowledge Level: Medium Comprehension Level: Medium Ability to understand written Medium instructions: Ability to understand verbal Medium instructions: Motivation Assessment Anxiety Level: Calm Cooperation: Cooperative Education Importance: Acknowledges Need Interest in Health Problems: Asks Questions Perception: Coherent Willingness to Engage in Self- Medium Management Activities: Medium Rout, RAELINN GIAMPA ([id number]) Readiness to Engage in Self- Management Activities: Electronic Signature(s) Signed: 12/26/2016 5:09:18 PM By: Montey Hora Entered By: Montey Hora on 12/26/2016 08:36:15 Conrad, Raul Del  ([id number]) -------------------------------------------------------------------------------- Fall Risk Assessment Details Patient Name: Casebier, Nela B. Date of Service: 12/26/2016 8:00 AM Medical Record Patient Account Number: [id number] Number: Treating RN: Montey Hora May 27, 1941 (75 y.o. Other Clinician: Date of Birth/Sex: Female) Treating ROBSON, MICHAEL Primary Care Ebert Forrester: Frazier Richards Massa Pe/Extender: G Referring Juanluis Guastella: Radonna Ricker in Treatment: 0 Fall Risk Assessment Items Have you had 2 or more falls in the last 12 monthso 0 No Have you had any fall that resulted in injury in the last 12 monthso 0 Yes FALL RISK ASSESSMENT: History of falling - immediate or within 3 months 25 Yes Secondary diagnosis 0  No Ambulatory aid None/bed rest/wheelchair/nurse 0 Yes Crutches/cane/walker 0 No Furniture 0 No IV Access/Saline Lock 0 No Gait/Training Normal/bed rest/immobile 0 No Weak 10 Yes Impaired 0 No Mental Status Oriented to own ability 0 Yes Electronic Signature(s) Signed: 12/26/2016 5:09:18 PM By: Montey Hora Entered By: Montey Hora on 12/26/2016 08:36:32 Carswell, Raul Del ([id number]) -------------------------------------------------------------------------------- Foot Assessment Details Patient Name: Orsini, Glender B. Date of Service: 12/26/2016 8:00 AM Medical Record Patient Account Number: [id number] Number: Treating RN: Montey Hora [birth year] (75 y.o. Other Clinician: Date of Birth/Sex: Female) Treating ROBSON, MICHAEL Primary Care Cricket Goodlin: Frazier Richards Doretha Goding/Extender: G Referring Geneviene Tesch: Radonna Ricker in Treatment: 0 Foot Assessment Items Site Locations + = Sensation present, - = Sensation absent, C = Callus, U = Ulcer R = Redness, W = Warmth, M = Maceration, PU = Pre-ulcerative lesion F = Fissure, S = Swelling, D = Dryness Assessment Right: Left: Other Deformity: No No Prior Foot Ulcer: No  No Prior Amputation: No No Charcot Joint: No No Ambulatory Status: Non-ambulatory Assistance Device: Wheelchair GaitEnergy manager) Signed: 12/26/2016 5:09:18 PM By: Montey Hora Entered By: Montey Hora on 12/26/2016 08:38:23 Behar, Raul Del ([id number]) Friedlander, Alda BMarland Kitchen ([id number]) -------------------------------------------------------------------------------- Nutrition Risk Assessment Details Patient Name: Hentges, Shacola B. Date of Service: 12/26/2016 8:00 AM Medical Record Patient Account Number: [id number] Number: Treating RN: Montey Hora 1941/04/08 (75 y.o. Other Clinician: Date of Birth/Sex: Female) Treating ROBSON, MICHAEL Primary Care Calton H[place]: Frazier Richards Ketsia Linebaugh/Extender: G Referring Christyl Osentoski: Radonna Ricker in Treatment: 0 Height (in): Weight (lbs): Body Mass Index (BMI): Nutrition Risk Assessment Items NUTRITION RISK SCREEN: I have an illness or condition that made me change the kind and/or 0 No amount of food I eat I eat fewer than two meals per day 0 No I eat few fruits and vegetables, or milk products 0 No I have three or more drinks of beer, liquor or wine almost every day 0 No I have tooth or mouth problems that make it hard for me to eat 0 No I don't always have enough money to buy the food I need 0 No I eat alone most of the time 0 No I take three or more different prescribed or over-the-counter drugs a 1 Yes day Without wanting to, I have lost or gained 10 pounds in the last six 0 No months I am not always physically able to shop, cook and/or feed myself 0 No Nutrition Protocols Good Risk Protocol 0 No interventions needed Moderate Risk Protocol Electronic Signature(s) Signed: 12/26/2016 5:09:18 PM By: Montey Hora Entered By: Montey Hora on 12/26/2016 08:36:39

## 2017-01-02 ENCOUNTER — Encounter: Admitting: Internal Medicine

## 2017-01-02 DIAGNOSIS — L89620 Pressure ulcer of left heel, unstageable: Secondary | ICD-10-CM | POA: Diagnosis not present

## 2017-01-03 NOTE — Progress Notes (Addendum)
AAYA, GADWAY ([id number]) Visit Report for 01/02/2017 Chief Complaint Document Details Patient Name: [name], [name] Date of Service: 01/02/2017 2:30 PM Medical Record Patient Account Number: [id number] Number: Treating RN: Montey Hora 07-11-1941 (75 y.o. Other Clinician: Date of Birth/Sex: Female) Treating Amiya Escamilla Primary Care Provider: Frazier Richards Provider/Extender: G Referring Provider: Jenelle Mages in Treatment: 1 Information Obtained from: Patient Chief Complaint 01/05/17; this is a 76 year old disabled woman from [facility] who is here for review of multiple wounds. She is accompanied by her son Engineer, maintenance) Signed: 01/02/2017 5:24:26 PM By: Linton Ham MD Entered By: Linton Ham on 01/02/2017 15:47:48 Loudenslager, Raul Del ([id number]) -------------------------------------------------------------------------------- HPI Details Patient Name: Delsignore, Samai B. Date of Service: 01/02/2017 2:30 PM Medical Record Patient Account Number: [id number] Number: Treating RN: Montey Hora [birth year] (75 y.o. Other Clinician: Date of Birth/Sex: Female) Treating Luisa Louk Primary Care Provider: Frazier Richards Provider/Extender: G Referring Provider: Jenelle Mages in Treatment: 1 History of Present Illness HPI Description: 12/26/16; this is a 76 year old woman who is from [facility] skilled facility. She is accompanied by her son who provides most of the history. He tells me he was called in early December to report a fall in the shower room at the facility. An x-ray showed a right supracondylar distal femur fracture. She was put in a brace and treated nonoperatively. She was also admitted to hospital in mid December and discharged on 12/18. During this admission she had sepsis secondary to an acute UTI. In reviewing [facility] she is also quoted during that admission as having a left ischial  tuberosity wound coccyx wound and a deep tissue injury over the left heel. Her son states that sometime after this and when she returned to see her orthopedic surgeon she developed wound areas under the brace on the right lateral leg have both distally and a smaller wound superiorly on the lateral right leg. I am not completely certain what the facility is placing on these wounds. The patient has a history of atrial fibrillation. Her son tells me that years ago she had a fairly significant stroke but maintained a good quality of life up until 2 years ago when she developed dementia then things have really been going downhill since. She is been at a nursing facility for the last 18 months [[facility]] barely her nutritional intake is marginal but improving. Her son states that he is there and a lot of meals and she eats 75%. She is receiving nutritional supplements as well as protein supplements she has a level II pressure-relief surface and bunny boots bilaterally on both feet 01/02/17; patient returns this week. Not much change. I crosshatched the left heel last week they have been applying Santyl. The extensive areas on the right lateral leg especially distally with a large area of exposed tendon we have been applying silver collagen. To her coccyx wound also silver collagen. According to her son who visits her daily and participates in her care her intake is fairly good she drinks well takes her nutritional supplements. She is at [facility] skilled facility. Electronic Signature(s) Signed: 01/02/2017 5:24:26 PM By: Linton Ham MD Entered By: Linton Ham on 01/02/2017 15:49:17 Reasoner, Raul Del ([id number]) -------------------------------------------------------------------------------- Physical Exam Details Patient Name: Moen, Shequita B. Date of Service: 01/02/2017 2:30 PM Medical Record Patient Account Number: [id number] Number: Treating RN: Montey Hora 1940-12-19 (75 y.o. Other Clinician: Date of Birth/Sex: Female) Treating Cayce Quezada Primary Care Provider: Frazier Richards Provider/Extender: Darnell Level  Referring Provider: Jenelle Mages in Treatment: 1 Constitutional Patient is hypertensive.. Pulse regular and within target range for patient.Marland Kitchen Respirations regular, non-labored and within target range.. Temperature is normal and within the target range for the patient.. Patient's appearance is neat and clean. Appears in no acute distress. Well nourished and well developed.Marland Kitchen Respiratory Respiratory effort is easy and symmetric bilaterally. Rate is normal at rest and on room air.. Cardiovascular Heart rhythm and rate regular, without murmur or gallop. She appears to be euvolemic.Marland Kitchen Pedal pulses are palpable.. Gastrointestinal (GI) Abdomen is soft and non-distended without masses or tenderness. Bowel sounds active in all quadrants.. No liver or spleen enlargement or tenderness.. Notes Wound exam; still the 4 major wound areas #1 over her coccyx and left buttock's a stage III pressure wound. This did not look too bad surface granulated no major change and dimension however. No debridement was done #2 left heel has a black eschar. I crosshatched this last week we've been using Santyl. The area is gradually loosening and liquefying #3 right lateral lower leg. The lower and upper wounds appear to be stable with healthy tissue. The middle wound has almost 75% of the area with involved tendon. This would need plastic surgery in a different setting. Electronic Signature(s) Signed: 01/02/2017 5:24:26 PM By: Linton Ham MD Entered By: Linton Ham on 01/02/2017 15:53:54 Bourgoin, Raul Del ([id number]) -------------------------------------------------------------------------------- Physician Orders Details Patient Name: Farooqui, Keierra B. Date of Service: 01/02/2017 2:30 PM Medical Record Patient Account Number:  [id number] Number: Treating RN: Montey Hora 1940/12/06 (75 y.o. Other Clinician: Date of Birth/Sex: Female) Treating Vladimir Lenhoff Primary Care Provider: Frazier Richards Provider/Extender: G Referring Provider: Jenelle Mages in Treatment: 1 Verbal / Phone Orders: Yes Clinician: Montey Hora Read Back and Verified: Yes Diagnosis Coding Wound Cleansing Wound #1 Right,Distal,Lateral Lower Leg o Clean wound with Normal Saline. o Cleanse wound with mild soap and water Wound #2 Right,Proximal,Lateral Lower Leg o Clean wound with Normal Saline. o Cleanse wound with mild soap and water Wound #3 Left Calcaneus o Clean wound with Normal Saline. o Cleanse wound with mild soap and water Wound #4 Right Sacrum o Clean wound with Normal Saline. o Cleanse wound with mild soap and water Anesthetic Wound #1 Right,Distal,Lateral Lower Leg o Topical Lidocaine 4% cream applied to wound bed prior to debridement Wound #2 Right,Proximal,Lateral Lower Leg o Topical Lidocaine 4% cream applied to wound bed prior to debridement Wound #3 Left Calcaneus o Topical Lidocaine 4% cream applied to wound bed prior to debridement Wound #4 Right Sacrum o Topical Lidocaine 4% cream applied to wound bed prior to debridement Primary Wound Dressing Wound #1 Right,Distal,Lateral Lower Leg o Hydrogel - cover tendon with hydrogel o Prisma Ag - or collagen with silver equivalent Zechman, Inetha B. ([id number]) Wound #2 Right,Proximal,Lateral Lower Leg o Prisma Ag - or collagen with silver equivalent Wound #3 Left Calcaneus o Santyl Ointment Wound #4 Right Sacrum o Prisma Ag Secondary Dressing Wound #1 Right,Distal,Lateral Lower Leg o Gauze, ABD and Kerlix/Conform - secure with coban - do not wrap tight Wound #2 Right,Proximal,Lateral Lower Leg o Gauze, ABD and Kerlix/Conform - secure with coban - do not wrap tight Wound #3 Left Calcaneus o Dry  Gauze o Boardered Foam Dressing Wound #4 Right Sacrum o Dry Gauze o Boardered Foam Dressing Dressing Change Frequency Wound #1 Right,Distal,Lateral Lower Leg o Change dressing every day. Wound #2 Right,Proximal,Lateral Lower Leg o Change dressing every day. Wound #3 Left Calcaneus o Change dressing every day. Wound #  4 Right Sacrum o Change dressing every day. Follow-up Appointments Wound #1 Right,Distal,Lateral Lower Leg o Return Appointment in 2 weeks. Wound #2 Right,Proximal,Lateral Lower Leg o Return Appointment in 2 weeks. Wound #3 Left Calcaneus o Return Appointment in 2 weeks. Lobb, Kesa B. ([id number]) Wound #4 Right Sacrum o Return Appointment in 2 weeks. Off-Loading Wound #1 Right,Distal,Lateral Lower Leg o Turn and reposition every 2 hours Wound #2 Right,Proximal,Lateral Lower Leg o Turn and reposition every 2 hours Wound #3 Left Calcaneus o Turn and reposition every 2 hours Wound #4 Right Sacrum o Turn and reposition every 2 hours Additional Orders / Instructions Wound #1 Right,Distal,Lateral Lower Leg o Increase protein intake. o Other: - please add vitamin c, vitamin a and zinc supplements to patient's diet Wound #2 Right,Proximal,Lateral Lower Leg o Increase protein intake. o Other: - please add vitamin c, vitamin a and zinc supplements to patient's diet Wound #3 Left Calcaneus o Increase protein intake. o Other: - please add vitamin c, vitamin a and zinc supplements to patient's diet Wound #4 Right Sacrum o Increase protein intake. o Other: - please add vitamin c, vitamin a and zinc supplements to patient's diet Medications-please add to medication list. Wound #3 Left Calcaneus o Santyl Enzymatic Ointment - Please provide this for patient's wound Electronic Signature(s) Signed: 01/02/2017 5:24:26 PM By: Linton Ham MD Signed: 01/02/2017 5:49:38 PM By: Montey Hora Entered By: Montey Hora on  01/02/2017 15:00:59 Weinfeld, Raul Del ([id number]) -------------------------------------------------------------------------------- Problem List Details Patient Name: Luse, Kaileena B. Date of Service: 01/02/2017 2:30 PM Medical Record Patient Account Number: [id number] Number: Treating RN: Montey Hora 1941-05-03 (75 y.o. 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 Date of Service: 01/02/2017 2:30 PM Medical Record Patient Account Number: [id number] Number: Treating RN: Montey Hora 12/11/1940 (75 y.o. Other Clinician: Date of Birth/Sex: Female) Treating Robinn Overholt Primary Care Provider: Frazier Richards Provider/Extender: G Referring Provider: Jenelle Mages in Treatment: 1 Subjective Chief Complaint Information obtained from Patient 01/05/17; this is a 76 year old disabled woman from [facility] who is here for review of multiple wounds. She is accompanied by her son History of Present Illness (HPI) 12/26/16; this is a 76 year old woman who is from [facility] skilled facility. She is accompanied by her son who provides most of the history. He tells me he was called in early December  to report a fall in the shower room at the facility. An x-ray showed a right supracondylar distal femur fracture. She was put in a brace and treated nonoperatively. She was also admitted to hospital in mid December and discharged on 12/18. During this admission she had sepsis secondary to an acute UTI. In reviewing [facility] she is also quoted during that admission as having a left ischial tuberosity wound coccyx wound and a deep tissue injury over the left heel. Her son states that sometime after this and when she returned to see her orthopedic surgeon she developed wound areas under the brace  on the right lateral leg have both distally and a smaller wound superiorly on the lateral right leg. I am not completely certain what the facility is placing on these wounds. The patient has a history of atrial fibrillation. Her son tells me that years ago she had a fairly significant stroke but maintained a good quality of life up until 2 years ago when she developed dementia then things have really been going downhill since. She is been at a nursing facility for the last 18 months [[facility]] barely her nutritional intake is marginal but improving. Her son states that he is there and a lot of meals and she eats 75%. She is receiving nutritional supplements as well as protein supplements she has a level II pressure-relief surface and bunny boots bilaterally on both feet 01/02/17; patient returns this week. Not much change. I crosshatched the left heel last week they have been applying Santyl. The extensive areas on the right lateral leg especially distally with a large area of exposed tendon we have been applying silver collagen. To her coccyx wound also silver collagen. According to her son who visits her daily and participates in her care her intake is fairly good she drinks well takes her nutritional supplements. She is at [facility] skilled facility. Tippin, Jami B.  ([id number]) Objective Constitutional Patient is hypertensive.. Pulse regular and within target range for patient.Marland Kitchen Respirations regular, non-labored and within target range.. Temperature is normal and within the target range for the patient.. Patient's appearance is neat and clean. Appears in no acute distress. Well nourished and well developed.. Vitals Time Taken: 2:20 PM, Temperature: 97.9 F, Pulse: 68 bpm, Respiratory Rate: 16 breaths/min, Blood Pressure: 100/68 mmHg. Respiratory Respiratory effort is easy and symmetric bilaterally. Rate is normal at rest and on room air.. Cardiovascular Heart rhythm and rate regular, without murmur or gallop. She appears to be euvolemic.Marland Kitchen Pedal pulses are palpable.. Gastrointestinal (GI) Abdomen is soft and non-distended without masses or tenderness. Bowel sounds active in all quadrants.. No liver or spleen enlargement or tenderness.. General Notes: Wound exam; still the 4 major wound areas #1 over her coccyx and left buttock's a stage III pressure wound. This did not look too bad surface granulated no major change and dimension however. No debridement was done #2 left heel has a black eschar. I crosshatched this last week we've been using Santyl. The area is gradually loosening and liquefying #3 right lateral lower leg. The lower and upper wounds appear to be stable with healthy tissue. The middle wound has almost 75% of the area with involved tendon. This would need plastic surgery in a different setting. Integumentary (Hair, Skin) Wound #1 status is Open. Original cause of wound was Pressure Injury. The wound is located on the Right,Distal,Lateral Lower Leg. The wound measures 13.3cm length x 3.5cm width x 0.5cm depth; 36.56cm^2 area and 18.28cm^3 volume. There is tendon exposed. There is no tunneling or undermining noted. There is a large amount of serous drainage noted. The wound margin is flat and intact. There is large (67-100%) red  granulation within the wound bed. There is a small (1-33%) amount of necrotic tissue within the wound bed including Eschar. The periwound skin appearance did not exhibit: Callus, Crepitus, Excoriation, Induration, Rash, Scarring, Dry/Scaly, Maceration, Atrophie Blanche, Cyanosis, Ecchymosis, Hemosiderin Staining, Mottled, Pallor, Rubor, Erythema. Periwound temperature was noted as No Abnormality. The periwound has tenderness on palpation. Wound #2 status is Open. Original cause of wound was Pressure Injury. The wound is located on  the Right,Proximal,Lateral Lower Leg. The wound measures 2.8cm length x 2.8cm width x 0.1cm depth; 6.158cm^2 area and 0.616cm^3 volume. The wound is limited to skin breakdown. There is no tunneling or undermining noted. There is a medium amount of serous drainage noted. The wound margin is flat and intact. There is large (67-100%) red granulation within the wound bed. There is a small (1-33%) amount of necrotic tissue within the wound bed including Eschar and Adherent Slough. The periwound skin appearance did not exhibit: Callus, Crepitus, Excoriation, Induration, Rash, Scarring, Dry/Scaly, Maceration, Atrophie Blanche, Cyanosis, Ecchymosis, Hemosiderin Staining, Mottled, Pallor, Rubor, Erythema. Proby, Palmira B. ([id number]) Periwound temperature was noted as No Abnormality. The periwound has tenderness on palpation. Wound #3 status is Open. Original cause of wound was Pressure Injury. The wound is located on the Left Calcaneus. The wound measures 3cm length x 5cm width x 0.1cm depth; 11.781cm^2 area and 1.178cm^3 volume. The wound is limited to skin breakdown. There is no tunneling or undermining noted. There is a none present amount of drainage noted. The wound margin is flat and intact. There is no granulation within the wound bed. There is a large (67-100%) amount of necrotic tissue within the wound bed including Eschar. The periwound skin appearance did not exhibit:  Callus, Crepitus, Excoriation, Induration, Rash, Scarring, Dry/Scaly, Maceration, Atrophie Blanche, Cyanosis, Ecchymosis, Hemosiderin Staining, Mottled, Pallor, Rubor, Erythema. Periwound temperature was noted as No Abnormality. Wound #4 status is Open. Original cause of wound was Pressure Injury. The wound is located on the Right Sacrum. The wound measures 2.8cm length x 4.5cm width x 0.1cm depth; 9.896cm^2 area and 0.99cm^3 volume. The wound is limited to skin breakdown. There is no tunneling or undermining noted. There is a large amount of serous drainage noted. The wound margin is flat and intact. There is large (67-100%) pink granulation within the wound bed. There is a small (1-33%) amount of necrotic tissue within the wound bed including Adherent Slough. The periwound skin appearance did not exhibit: Callus, Crepitus, Excoriation, Induration, Rash, Scarring, Dry/Scaly, Maceration, Atrophie Blanche, Cyanosis, Ecchymosis, Hemosiderin Staining, Mottled, Pallor, Rubor, Erythema. Periwound temperature was noted as No Abnormality. The periwound has tenderness on palpation. 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 Cleanse wound with mild soap and water Wound #2 Right,Proximal,Lateral Lower Leg: Clean wound with Normal Saline. Cleanse wound with mild soap and water Wound #3 Left Calcaneus: Clean wound with Normal Saline. Cleanse wound with mild soap and water Bennion, Yesha B. ([id number]) Wound #4 Right Sacrum: Clean wound with Normal Saline. Cleanse wound with mild soap and water Anesthetic: Wound #1 Right,Distal,Lateral Lower Leg: Topical Lidocaine 4% cream applied to wound bed prior to debridement Wound #2  Right,Proximal,Lateral Lower Leg: Topical Lidocaine 4% cream applied to wound bed prior to debridement Wound #3 Left Calcaneus: Topical Lidocaine 4% cream applied to wound bed prior to debridement Wound #4 Right Sacrum: Topical Lidocaine 4% cream applied to wound bed prior to debridement Primary Wound Dressing: Wound #1 Right,Distal,Lateral Lower Leg: Hydrogel - cover tendon with hydrogel Prisma Ag - or collagen with silver equivalent Wound #2 Right,Proximal,Lateral Lower Leg: Prisma Ag - or collagen with silver equivalent Wound #3 Left Calcaneus: Santyl Ointment Wound #4 Right Sacrum: Prisma Ag Secondary Dressing: Wound #1 Right,Distal,Lateral Lower Leg: Gauze, ABD and Kerlix/Conform -  secure with coban - do not wrap tight Wound #2 Right,Proximal,Lateral Lower Leg: Gauze, ABD and Kerlix/Conform - secure with coban - do not wrap tight Wound #3 Left Calcaneus: Dry Gauze Boardered Foam Dressing Wound #4 Right Sacrum: Dry Gauze Boardered Foam Dressing Dressing Change Frequency: Wound #1 Right,Distal,Lateral Lower Leg: Change dressing every day. Wound #2 Right,Proximal,Lateral Lower Leg: Change dressing every day. Wound #3 Left Calcaneus: Change dressing every day. Wound #4 Right Sacrum: Change dressing every day. Follow-up Appointments: Wound #1 Right,Distal,Lateral Lower Leg: Return Appointment in 2 weeks. Wound #2 Right,Proximal,Lateral Lower Leg: Return Appointment in 2 weeks. Wound #3 Left Calcaneus: Return Appointment in 2 weeks. Wound #4 Right Sacrum: Return Appointment in 2 weeks. Dotts, Emmilynn B. ([id number]) Off-Loading: Wound #1 Right,Distal,Lateral Lower Leg: Turn and reposition every 2 hours Wound #2 Right,Proximal,Lateral Lower Leg: Turn and reposition every 2 hours Wound #3 Left Calcaneus: Turn and reposition every 2 hours Wound #4 Right Sacrum: Turn and reposition every 2 hours Additional Orders / Instructions: Wound #1 Right,Distal,Lateral Lower  Leg: Increase protein intake. Other: - please add vitamin c, vitamin a and zinc supplements to patient's diet Wound #2 Right,Proximal,Lateral Lower Leg: Increase protein intake. Other: - please add vitamin c, vitamin a and zinc supplements to patient's diet Wound #3 Left Calcaneus: Increase protein intake. Other: - please add vitamin c, vitamin a and zinc supplements to patient's diet Wound #4 Right Sacrum: Increase protein intake. Other: - please add vitamin c, vitamin a and zinc supplements to patient's diet Medications-please add to medication list.: Wound #3 Left Calcaneus: Santyl Enzymatic Ointment - Please provide this for patient's wound #1 no change to the primary dressings at any site Santyl to the left heel, collagen to the rest of the wounds. #2 according to her son she is eating and drinking acceptably. #3 the area on the right mid leg with a large exposed tendon would otherwise need a trip to the OR with plastic surgery although under hospice with advanced dementia I can't imagine that that's dumping that should be done. Electronic Signature(s) Signed: 01/07/2017 12:36:27 PM By: Gretta Cool RN, BSN, Kim RN, BSN Signed: 01/08/2017 7:53:49 AM By: Linton Ham MD Previous Signature: 01/02/2017 5:24:26 PM Version By: Linton Ham MD Entered By: Gretta Cool RN, BSN, Kim on 01/07/2017 12:34:04 Lyford, Raul Del ([id number]) -------------------------------------------------------------------------------- [place] Details Patient Name: Jobson, Tarra B. Date of Service: 01/02/2017 Medical Record Patient Account Number: [id number] Number: Treating RN: Montey Hora 02/22/1941 (75 y.o. Other Clinician: Date of Birth/Sex: Female) Treating Enes Rokosz, Chamita Primary Care Provider: Frazier Richards Provider/Extender: G Referring Provider: Frazier Richards Service Line: Outpatient Weeks in Treatment: 1 Diagnosis Coding ICD-10 Codes Code Description L89.620 Pressure ulcer of  left heel, unstageable L89.152 Pressure ulcer of sacral region, stage 2 L97.213 Non-pressure chronic ulcer of right calf with necrosis of muscle Facility Procedures CPT4 Code: [id number] Description: [id number] - WOUND CARE VISIT-LEV 5 EST PT Modifier: Quantity: 1 Physician Procedures CPT4 Code Description: [id number] - WC PHYS LEVEL 4 - EST PT ICD-10 Description Diagnosis L89.620 Pressure ulcer of left heel, unstageable L97.213 Non-pressure chronic ulcer of right calf with necro Modifier: sis of muscle Quantity: 1 Electronic Signature(s) Signed: 01/02/2017 5:14:13 PM By: Montey Hora Signed: 01/02/2017 5:24:26 PM By: Linton Ham MD Entered By: Montey Hora on 01/02/2017 17:14:13

## 2017-01-03 NOTE — Progress Notes (Addendum)
CARSYN, SALM ([id number]) Visit Report for 01/02/2017 Arrival Information Details Patient Name: [name], [name] Date of Service: 01/02/2017 2:30 PM Medical Record Patient Account Number: [id number] Number: Treating RN: Montey Hora 02-05-1941 (76 y.o. Other Clinician: Date of Birth/Sex: Female) Treating ROBSON, MICHAEL Primary Care Laron Angelini: Frazier Richards Jaymi Tinner/Extender: G Referring Rossana Molchan: Jenelle Mages in Treatment: 1 Visit Information History Since Last Visit Added or deleted any medications: No Patient Arrived: Wheel Chair Any new allergies or adverse reactions: No Arrival Time: 14:17 Had a fall or experienced change in No Accompanied By: son activities of daily living that may affect Transfer Assistance: Harrel Lemon Lift risk of falls: Patient Identification Yes Signs or symptoms of abuse/neglect since last No Verified: visito Secondary Verification Yes Hospitalized since last visit: No Process Completed: Has Dressing in Place as Prescribed: Yes Patient Has Alerts: Yes Pain Present Now: Yes Patient Alerts: Patient on Blood Thinner eliquis NO ABI R/T WOUND LOCATION Electronic Signature(s) Signed: 01/02/2017 5:49:38 PM By: Montey Hora Entered By: Montey Hora on 01/02/2017 14:29:02 Blake, Raul Del ([id number]) -------------------------------------------------------------------------------- Clinic Level of Care Assessment Details Patient Name: Toppins, [name] Date of Service: 01/02/2017 2:30 PM Medical Record Patient Account Number: [id number] Number: Treating RN: Montey Hora 11/23/1940 (76 y.o. Other Clinician: Date of Birth/Sex: Female) Treating ROBSON, Tornillo Primary Care Mujtaba Bollig: Frazier Richards Lani Havlik/Extender: G Referring Zeda Gangwer: Jenelle Mages in Treatment: 1 Clinic Level of Care Assessment Items TOOL 4 Quantity Score []  - Use when only an EandM is performed on FOLLOW-UP visit  0 ASSESSMENTS - Nursing Assessment / Reassessment X - Reassessment of Co-morbidities (includes updates in patient status) 1 10 X - Reassessment of Adherence to Treatment Plan 1 5 ASSESSMENTS - Wound and Skin Assessment / Reassessment []  - Simple Wound Assessment / Reassessment - one wound 0 X - Complex Wound Assessment / Reassessment - multiple wounds 4 5 []  - Dermatologic / Skin Assessment (not related to wound area) 0 ASSESSMENTS - Focused Assessment []  - Circumferential Edema Measurements - multi extremities 0 []  - Nutritional Assessment / Counseling / Intervention 0 X - Lower Extremity Assessment (monofilament, tuning fork, pulses) 1 5 []  - Peripheral Arterial Disease Assessment (using hand held doppler) 0 ASSESSMENTS - Ostomy and/or Continence Assessment and Care []  - Incontinence Assessment and Management 0 []  - Ostomy Care Assessment and Management (repouching, etc.) 0 PROCESS - Coordination of Care X - Simple Patient / Family Education for ongoing care 1 15 []  - Complex (extensive) Patient / Family Education for ongoing care 0 []  - Staff obtains Programmer, systems, Records, Test Results / Process Orders 0 []  - Staff telephones HHA, Nursing Homes / Clarify orders / etc 0 Watts, Peri B. ([id number]) []  - Routine Transfer to another Facility (non-emergent condition) 0 []  - Routine Hospital Admission (non-emergent condition) 0 []  - New Admissions / Biomedical engineer / Ordering NPWT, Apligraf, etc. 0 []  - Emergency Hospital Admission (emergent condition) 0 X - Simple Discharge Coordination 1 10 []  - Complex (extensive) Discharge Coordination 0 PROCESS - Special Needs []  - Pediatric / Minor Patient Management 0 []  - Isolation Patient Management 0 []  - Hearing / Language / Visual special needs 0 []  - Assessment of Community assistance (transportation, D/C planning, etc.) 0 []  - Additional assistance / Altered mentation 0 []  - Support Surface(s) Assessment (bed, cushion, seat, etc.)  0 INTERVENTIONS - Wound Cleansing / Measurement []  - Simple Wound Cleansing - one wound 0 X - Complex Wound Cleansing - multiple wounds 4 5 X - Wound  Imaging (photographs - any number of wounds) 1 5 []  - Wound Tracing (instead of photographs) 0 []  - Simple Wound Measurement - one wound 0 X - Complex Wound Measurement - multiple wounds 4 5 INTERVENTIONS - Wound Dressings X - Small Wound Dressing one or multiple wounds 2 10 X - Medium Wound Dressing one or multiple wounds 2 15 []  - Large Wound Dressing one or multiple wounds 0 []  - Application of Medications - topical 0 []  - Application of Medications - injection 0 Thoennes, Mahek B. ([id number]) INTERVENTIONS - Miscellaneous []  - External ear exam 0 []  - Specimen Collection (cultures, biopsies, blood, body fluids, etc.) 0 []  - Specimen(s) / Culture(s) sent or taken to Lab for analysis 0 []  - Patient Transfer (multiple staff / Harrel Lemon Lift / Similar devices) 0 []  - Simple Staple / Suture removal (25 or less) 0 []  - Complex Staple / Suture removal (26 or more) 0 []  - Hypo / Hyperglycemic Management (close monitor of Blood Glucose) 0 []  - Ankle / Brachial Index (ABI) - do not check if billed separately 0 X - Vital Signs 1 5 Has the patient been seen at the hospital within the last three years: Yes Total Score: 165 Level Of Care: New/Established - Level 5 Electronic Signature(s) Signed: 01/02/2017 5:49:38 PM By: Montey Hora Entered By: Montey Hora on 01/02/2017 17:14:06 Pavel, Raul Del ([id number]) -------------------------------------------------------------------------------- Encounter Discharge Information Details Patient Name: Kackley, [name] Date of Service: 01/02/2017 2:30 PM Medical Record Patient Account Number: [id number] Number: Treating RN: Montey Hora 14-Sep-1941 (76 y.o. Other Clinician: Date of Birth/Sex: Female) Treating ROBSON, MICHAEL Primary Care Shriley Joffe: Frazier Richards Jolynne Spurgin/Extender:  G Referring Lorella Gomez: Jenelle Mages in Treatment: 1 Encounter Discharge Information Items Discharge Pain Level: 0 Discharge Condition: Stable Ambulatory Status: Wheelchair Discharge Destination: Nursing Home Transportation: Private Auto Accompanied By: son Schedule Follow-up Appointment: Yes Medication Reconciliation completed and provided to Patient/Care No Abril Cappiello: Provided on Clinical Summary of Care: 01/02/2017 Form Type Recipient Paper Patient EM Electronic Signature(s) Signed: 01/02/2017 5:15:41 PM By: Montey Hora Previous Signature: 01/02/2017 3:34:46 PM Version By: Ruthine Dose Entered By: Montey Hora on 01/02/2017 17:15:41 Belvin, Raul Del ([id number]) -------------------------------------------------------------------------------- Lower Extremity Assessment Details Patient Name: Dibbern, Marnie B. Date of Service: 01/02/2017 2:30 PM Medical Record Patient Account Number: [id number] Number: Treating RN: Montey Hora [birth year] (75 y.o. Other Clinician: Date of Birth/Sex: Female) Treating ROBSON, MICHAEL Primary Care Karalyn Kadel: Frazier Richards Adalberto Metzgar/Extender: G Referring Selicia Windom: Jenelle Mages in Treatment: 1 Edema Assessment Assessed: [Left: No] [Right: No] Edema: [Left: N] [Right: o] Vascular Assessment Pulses: Dorsalis Pedis Palpable: [Right:Yes] Posterior Tibial Extremity colors, hair growth, and conditions: Extremity Color: [Right:Hyperpigmented] Hair Growth on Extremity: [Right:No] Temperature of Extremity: [Right:Warm] Capillary Refill: [Right:< 3 seconds] Electronic Signature(s) Signed: 01/02/2017 5:49:38 PM By: Montey Hora Entered By: Montey Hora on 01/02/2017 14:58:31 Whitenight, Trinka B. ([id number]) -------------------------------------------------------------------------------- Multi Wound Chart Details Patient Name: Angelillo, Roby B. Date of Service: 01/02/2017 2:30 PM Medical Record Patient Account  Number: [id number] Number: Treating RN: Montey Hora [birth year] (75 y.o. Other Clinician: Date of Birth/Sex: Female) Treating ROBSON, MICHAEL Primary Care Hailey Miles: Frazier Richards Reianna Batdorf/Extender: G Referring [place]: Jenelle Mages in Treatment: 1 Vital Signs Height(in): Pulse(bpm): 68 Weight(lbs): Blood Pressure 100/68 (mmHg): Body Mass Index(BMI): Temperature(F): 97.9 Respiratory Rate 16 (breaths/min): Photos: [1:No Photos] [2:No Photos] [3:No Photos] Wound Location: [1:Right Lower Leg - Lateral, Distal] [2:Right Lower Leg - Lateral, Proximal] [3:Left Calcaneus] Wounding Event: [1:Pressure Injury] [2:Pressure Injury] [3:Pressure Injury] Primary Etiology: [1:Pressure  Ulcer] [2:Pressure Ulcer] [3:Pressure Ulcer] Comorbid History: [1:Cataracts, Arrhythmia, History of pressure wounds, Gout, Seizure Disorder] [2:Cataracts, Arrhythmia, History of pressure wounds, Gout, Seizure Disorder] [3:Cataracts, Arrhythmia, History of pressure wounds, Gout, Seizure Disorder] Date Acquired: [1:11/07/2016] [2:11/07/2016] [3:10/30/2016] Weeks of Treatment: [1:1] [2:1] [3:1] Wound Status: [1:Open] [2:Open] [3:Open] Measurements L x W x D 13.3x3.5x0.5 [2:2.8x2.8x0.1] [3:3x5x0.1] (cm) Area (cm) : [1:36.56] [2:6.158] [3:11.781] Volume (cm) : [1:18.28] [2:0.616] [3:1.178] % Reduction in Area: [1:16.90%] [2:-56.80%] [3:16.70%] % Reduction in Volume: 16.90% [2:-56.70%] [3:16.70%] Classification: [1:Category/Stage IV] [2:Category/Stage III] [3:Unstageable/Unclassified] Exudate Amount: [1:Large] [2:Medium] [3:None Present] Exudate Type: [1:Serous] [2:Serous] [3:N/A] Exudate Color: [1:amber] [2:amber] [3:N/A] Foul Odor After [1:Yes] [2:Yes] [3:No] Cleansing: Odor Anticipated Due to No [2:No] [3:N/A] Product Use: Wound Margin: [1:Flat and Intact] [2:Flat and Intact] [3:Flat and Intact] Granulation Amount: [1:Large (67-100%)] [2:Large (67-100%)] [3:None Present  (0%)] Granulation Quality: Red Red N/A Necrotic Amount: Small (1-33%) Small (1-33%) Large (67-100%) Necrotic Tissue: Eschar Eschar, Adherent Slough Eschar Exposed Structures: Tendon: Yes Fascia: No Fascia: No Fascia: No Fat Layer (Subcutaneous Fat Layer (Subcutaneous Fat Layer (Subcutaneous Tissue) Exposed: No Tissue) Exposed: No Tissue) Exposed: No Tendon: No Tendon: No Muscle: No Muscle: No Muscle: No Joint: No Joint: No Joint: No Bone: No Bone: No Bone: No Limited to Skin Limited to Skin Breakdown Breakdown Epithelialization: None None Large (67-100%) Periwound Skin Texture: Excoriation: No Excoriation: No Excoriation: No Induration: No Induration: No Induration: No Callus: No Callus: No Callus: No Crepitus: No Crepitus: No Crepitus: No Rash: No Rash: No Rash: No Scarring: No Scarring: No Scarring: No Periwound Skin Maceration: No Maceration: No Maceration: No Moisture: Dry/Scaly: No Dry/Scaly: No Dry/Scaly: No Periwound Skin Color: Atrophie Blanche: No Atrophie Blanche: No Atrophie Blanche: No Cyanosis: No Cyanosis: No Cyanosis: No Ecchymosis: No Ecchymosis: No Ecchymosis: No Erythema: No Erythema: No Erythema: No Hemosiderin Staining: No Hemosiderin Staining: No Hemosiderin Staining: No Mottled: No Mottled: No Mottled: No Pallor: No Pallor: No Pallor: No Rubor: No Rubor: No Rubor: No Temperature: No Abnormality No Abnormality No Abnormality Tenderness on Yes Yes No Palpation: Wound Preparation: Ulcer Cleansing: Ulcer Cleansing: Ulcer Cleansing: Rinsed/Irrigated with Rinsed/Irrigated with Rinsed/Irrigated with Saline Saline Saline Topical Anesthetic Topical Anesthetic Topical Anesthetic Applied: Other: lidocaine Applied: Other: lidocaine Applied: None 4% 4% Wound Number: 4 N/A N/A Photos: No Photos N/A N/A Wound Location: Right Sacrum N/A N/A Wounding Event: Pressure Injury N/A N/A Primary Etiology: Pressure Ulcer N/A  N/A Comorbid History: Cataracts, Arrhythmia, N/A N/A History of pressure wounds, Gout, Seizure Disorder Date Acquired: 11/26/2016 N/A N/A Habeeb, Ridhima B. ([id number]) Weeks of Treatment: 1 N/A N/A Wound Status: Open N/A N/A Measurements L x W x D 2.8x4.5x0.1 N/A N/A (cm) Area (cm) : 9.896 N/A N/A Volume (cm) : 0.99 N/A N/A % Reduction in Area: 37.80% N/A N/A % Reduction in Volume: 37.70% N/A N/A Classification: Category/Stage III N/A N/A Exudate Amount: Large N/A N/A Exudate Type: Serous N/A N/A Exudate Color: amber N/A N/A Foul Odor After No N/A N/A Cleansing: Odor Anticipated Due to N/A N/A N/A Product Use: Wound Margin: Flat and Intact N/A N/A Granulation Amount: Large (67-100%) N/A N/A Granulation Quality: Pink N/A N/A Necrotic Amount: Small (1-33%) N/A N/A Necrotic Tissue: Adherent Slough N/A N/A Exposed Structures: Fascia: No N/A N/A Fat Layer (Subcutaneous Tissue) Exposed: No Tendon: No Muscle: No Joint: No Bone: No Limited to Skin Breakdown Epithelialization: None N/A N/A Periwound Skin Texture: Excoriation: No N/A N/A Induration: No Callus: No Crepitus: No Rash: No Scarring: No Periwound Skin Maceration: No N/A N/A  Moisture: Dry/Scaly: No Periwound Skin Color: Atrophie Blanche: No N/A N/A Cyanosis: No Ecchymosis: No Erythema: No Hemosiderin Staining: No Mottled: No Pallor: No Rubor: No Temperature: No Abnormality N/A N/A Tenderness on Yes N/A N/A Palpation: Rivero, Namiah B. ([id number]) Wound Preparation: Ulcer Cleansing: N/A N/A Rinsed/Irrigated with Saline Topical Anesthetic Applied: Other: lidocaine 4% Treatment Notes Electronic Signature(s) Signed: 01/02/2017 5:24:26 PM By: Linton Ham MD Entered By: Linton Ham on 01/02/2017 15:43:58 Estis, Raul Del ([id number]) -------------------------------------------------------------------------------- [facility] Details Patient Name: Pennick, Keani B. Date of Service:  01/02/2017 2:30 PM Medical Record Patient Account Number: [id number] Number: Treating RN: Montey Hora 1941-11-16 (75 y.o. Other Clinician: Date of Birth/Sex: Female) Treating ROBSON, MICHAEL Primary Care Mylan Lengyel: Frazier Richards Uyen Eichholz/Extender: G Referring Kimothy Kishimoto: Jenelle Mages in Treatment: 1 Active Inactive Electronic Signature(s) Signed: 01/29/2017 11:47:06 AM By: Gretta Cool RN, BSN, Kim RN, BSN Signed: 02/28/2017 4:57:05 PM By: Montey Hora Previous Signature: 01/02/2017 5:49:38 PM Version By: Montey Hora Entered By: Gretta Cool RN, BSN, Kim on 01/29/2017 11:47:06 Monts, Raul Del ([id number]) -------------------------------------------------------------------------------- Pain Assessment Details Patient Name: Schmader, Rosenda B. Date of Service: 01/02/2017 2:30 PM Medical Record Patient Account Number: [id number] Number: Treating RN: Montey Hora 1941/03/10 (75 y.o. Other Clinician: Date of Birth/Sex: Female) Treating ROBSON, MICHAEL Primary Care Illyanna Petillo: Frazier Richards Jahziel Sinn/Extender: G Referring Damiya Sandefur: Jenelle Mages in Treatment: 1 Active Problems Location of Pain Severity and Description of Pain Patient Has Paino Yes Site Locations Pain Location: Pain in Ulcers With Dressing Change: Yes Duration of the Pain. Constant / Intermittento Constant Pain Management and Medication Current Pain Management: Notes Topical or injectable lidocaine is offered to patient for acute pain when surgical debridement is performed. If needed, Patient is instructed to use over the counter pain medication for the following 24-48 hours after debridement. Wound care MDs do not prescribed pain medications. Patient has chronic pain or uncontrolled pain. Patient has been instructed to make an appointment with their Primary Care Physician for pain management. Electronic Signature(s) Signed: 01/02/2017 5:49:38 PM By: Montey Hora Entered  By: Montey Hora on 01/02/2017 14:29:23 Bramhall, Raul Del ([id number]) -------------------------------------------------------------------------------- Patient/Caregiver Education Details Patient Name: Ebel, Penny B. Date of Service: 01/02/2017 2:30 PM Medical Record Patient Account Number: [id number] Number: Treating RN: Montey Hora 08-17-1941 (75 y.o. Other Clinician: Date of Birth/Gender: Female) Treating ROBSON, [place] Primary Care Physician: Frazier Richards Physician/Extender: G Referring Physician: Jenelle Mages in Treatment: 1 Education Assessment Education Provided To: Caregiver Education Topics Provided Wound/Skin Impairment: Handouts: Other: wound care as ordered Methods: Demonstration, Explain/Verbal, Printed Responses: State content correctly Electronic Signature(s) Signed: 01/02/2017 5:49:38 PM By: Montey Hora Entered By: Montey Hora on 01/02/2017 17:16:00 Somma, Raul Del ([id number]) -------------------------------------------------------------------------------- Wound Assessment Details Patient Name: Slimp, Guiliana B. Date of Service: 01/02/2017 2:30 PM Medical Record Patient Account Number: [id number] Number: Treating RN: Montey Hora 03-May-1941 (75 y.o. Other Clinician: Date of Birth/Sex: Female) Treating ROBSON, [place] Primary Care Shaketa Serafin: Frazier Richards Izacc Demeyer/Extender: G Referring Hrishikesh Hoeg: Jenelle Mages in Treatment: 1 Wound Status Wound Number: 1 Primary Pressure Ulcer Etiology: Wound Location: Right Lower Leg - Lateral, Distal Wound Open Status: Wounding Event: Pressure Injury Comorbid Cataracts, Arrhythmia, History of Date Acquired: 11/07/2016 History: pressure wounds, Gout, Seizure Weeks Of Treatment: 1 Disorder Clustered Wound: No Photos Wound Measurements Length: (cm) 13.3 Width: (cm) 3.5 Depth: (cm) 0.5 Area: (cm) 36.56 Volume: (cm) 18.28 % Reduction in Area:  16.9% % Reduction in Volume: 16.9% Epithelialization: None Tunneling: No Undermining: No Wound Description Classification: Category/Stage IV Foul  Odor After Wound Margin: Flat and Intact Due to Product U Exudate Amount: Large Slough/Fibrino Exudate Type: Serous Exudate Color: amber Cleansing: Yes se: No Yes Wound Bed Granulation Amount: Large (67-100%) Exposed Structure Granulation Quality: Red Fascia Exposed: No Necrotic Amount: Small (1-33%) Fat Layer (Subcutaneous Tissue) Exposed: No Landini, Niralya B. ([id number]) Necrotic Quality: Eschar Tendon Exposed: Yes Muscle Exposed: No Joint Exposed: No Bone Exposed: No Periwound Skin Texture Texture Color No Abnormalities Noted: No No Abnormalities Noted: No Callus: No Atrophie Blanche: No Crepitus: No Cyanosis: No Excoriation: No Ecchymosis: No Induration: No Erythema: No Rash: No Hemosiderin Staining: No Scarring: No Mottled: No Pallor: No Moisture Rubor: No No Abnormalities Noted: No Dry / Scaly: No Temperature / Pain Maceration: No Temperature: No Abnormality Tenderness on Palpation: Yes Wound Preparation Ulcer Cleansing: Rinsed/Irrigated with Saline Topical Anesthetic Applied: Other: lidocaine 4%, Electronic Signature(s) Signed: 01/03/2017 5:12:01 PM By: Montey Hora Previous Signature: 01/02/2017 5:49:38 PM Version By: Montey Hora Entered By: Montey Hora on 01/03/2017 09:44:39 Kulzer, Raul Del ([id number]) -------------------------------------------------------------------------------- Wound Assessment Details Patient Name: Fussner, Alyss B. Date of Service: 01/02/2017 2:30 PM Medical Record Patient Account Number: [id number] Number: Treating RN: Montey Hora [birth year] (75 y.o. Other Clinician: Date of Birth/Sex: Female) Treating ROBSON, MICHAEL Primary Care Jeneal Vogl: Frazier Richards Tadarius Maland/Extender: G Referring Nazario Russom: Jenelle Mages in Treatment: 1 Wound  Status Wound Number: 2 Primary Pressure Ulcer Etiology: Wound Location: Right Lower Leg - Lateral, Proximal Wound Open Status: Wounding Event: Pressure Injury Comorbid Cataracts, Arrhythmia, History of Date Acquired: 11/07/2016 History: pressure wounds, Gout, Seizure Weeks Of Treatment: 1 Disorder Clustered Wound: No Photos Wound Measurements Length: (cm) 2.8 Width: (cm) 2.8 Depth: (cm) 0.1 Area: (cm) 6.158 Volume: (cm) 0.616 % Reduction in Area: -56.8% % Reduction in Volume: -56.7% Epithelialization: None Tunneling: No Undermining: No Wound Description Classification: Category/Stage III Foul Odor After Wound Margin: Flat and Intact Due to Product U Exudate Amount: Medium Slough/Fibrino Exudate Type: Serous Exudate Color: amber Cleansing: Yes se: No Yes Wound Bed Granulation Amount: Large (67-100%) Exposed Structure Granulation Quality: Red Fascia Exposed: No Necrotic Amount: Small (1-33%) Fat Layer (Subcutaneous Tissue) Exposed: No Fukuhara, Kailly B. ([id number]) Necrotic Quality: Eschar, Adherent Slough Tendon Exposed: No Muscle Exposed: No Joint Exposed: No Bone Exposed: No Limited to Skin Breakdown Periwound Skin Texture Texture Color No Abnormalities Noted: No No Abnormalities Noted: No Callus: No Atrophie Blanche: No Crepitus: No Cyanosis: No Excoriation: No Ecchymosis: No Induration: No Erythema: No Rash: No Hemosiderin Staining: No Scarring: No Mottled: No Pallor: No Moisture Rubor: No No Abnormalities Noted: No Dry / Scaly: No Temperature / Pain Maceration: No Temperature: No Abnormality Tenderness on Palpation: Yes Wound Preparation Ulcer Cleansing: Rinsed/Irrigated with Saline Topical Anesthetic Applied: Other: lidocaine 4%, Electronic Signature(s) Signed: 01/03/2017 5:12:01 PM By: Montey Hora Previous Signature: 01/02/2017 5:49:38 PM Version By: Montey Hora Entered By: Montey Hora on 01/03/2017 09:45:10 Dandy, Raul Del  ([id number]) -------------------------------------------------------------------------------- Wound Assessment Details Patient Name: Fike, Kathey B. Date of Service: 01/02/2017 2:30 PM Medical Record Patient Account Number: [id number] Number: Treating RN: Montey Hora 03-24-1941 (75 y.o. Other Clinician: Date of Birth/Sex: Female) Treating ROBSON, MICHAEL Primary Care Dyquan Minks: Frazier Richards Suriyah Vergara/Extender: G Referring Clary Boulais: Jenelle Mages in Treatment: 1 Wound Status Wound Number: 3 Primary Pressure Ulcer Etiology: Wound Location: Left Calcaneus Wound Open Wounding Event: Pressure Injury Status: Date Acquired: 10/30/2016 Comorbid Cataracts, Arrhythmia, History of Weeks Of Treatment: 1 History: pressure wounds, Gout, Seizure Clustered Wound: No Disorder Photos Wound Measurements Length: (cm) 3 Width: (cm)  5 Depth: (cm) 0.1 Area: (cm) 11.781 Volume: (cm) 1.178 % Reduction in Area: 16.7% % Reduction in Volume: 16.7% Epithelialization: Large (67-100%) Tunneling: No Undermining: No Wound Description Classification: Unstageable/Unclassified Foul Odor After Wound Margin: Flat and Intact Slough/Fibrino Exudate Amount: None Present Cleansing: No No Wound Bed Granulation Amount: None Present (0%) Exposed Structure Necrotic Amount: Large (67-100%) Fascia Exposed: No Necrotic Quality: Eschar Fat Layer (Subcutaneous Tissue) Exposed: No Tendon Exposed: No Muscle Exposed: No Mory, Emaly B. ([id number]) Joint Exposed: No Bone Exposed: No Limited to Skin Breakdown Periwound Skin Texture Texture Color No Abnormalities Noted: No No Abnormalities Noted: No Callus: No Atrophie Blanche: No Crepitus: No Cyanosis: No Excoriation: No Ecchymosis: No Induration: No Erythema: No Rash: No Hemosiderin Staining: No Scarring: No Mottled: No Pallor: No Moisture Rubor: No No Abnormalities Noted: No Dry / Scaly: No Temperature /  Pain Maceration: No Temperature: No Abnormality Wound Preparation Ulcer Cleansing: Rinsed/Irrigated with Saline Topical Anesthetic Applied: None Electronic Signature(s) Signed: 01/03/2017 5:12:01 PM By: Montey Hora Previous Signature: 01/02/2017 5:49:38 PM Version By: Montey Hora Entered By: Montey Hora on 01/03/2017 09:45:33 Talamantez, Raul Del ([id number]) -------------------------------------------------------------------------------- Wound Assessment Details Patient Name: Pottle, Demiyah B. Date of Service: 01/02/2017 2:30 PM Medical Record Patient Account Number: [id number] Number: Treating RN: Montey Hora October 16, 1941 (75 y.o. Other Clinician: Date of Birth/Sex: Female) Treating ROBSON, MICHAEL Primary Care Delina Kruczek: Frazier Richards Murrel Bertram/Extender: G Referring Victoria Euceda: Jenelle Mages in Treatment: 1 Wound Status Wound Number: 4 Primary Pressure Ulcer Etiology: Wound Location: Right Sacrum Wound Open Wounding Event: Pressure Injury Status: Date Acquired: 11/26/2016 Comorbid Cataracts, Arrhythmia, History of Weeks Of Treatment: 1 History: pressure wounds, Gout, Seizure Clustered Wound: No Disorder Photos Wound Measurements Length: (cm) 2.8 % Reduction in Area Width: (cm) 4.5 % Reduction in Volu Depth: (cm) 0.1 Epithelialization: Area: (cm) 9.896 Tunneling: Volume: (cm) 0.99 Undermining: : 37.8% me: 37.7% None No No Wound Description Classification: Category/Stage III Foul Odor After Cle Wound Margin: Flat and Intact Slough/Fibrino Exudate Amount: Large Exudate Type: Serous Exudate Color: amber ansing: No No Wound Bed Granulation Amount: Large (67-100%) Exposed Structure Granulation Quality: Pink Fascia Exposed: No Necrotic Amount: Small (1-33%) Fat Layer (Subcutaneous Tissue) Exposed: No Ramseyer, Akya B. ([id number]) Necrotic Quality: Adherent Slough Tendon Exposed: No Muscle Exposed: No Joint Exposed: No Bone  Exposed: No Limited to Skin Breakdown Periwound Skin Texture Texture Color No Abnormalities Noted: No No Abnormalities Noted: No Callus: No Atrophie Blanche: No Crepitus: No Cyanosis: No Excoriation: No Ecchymosis: No Induration: No Erythema: No Rash: No Hemosiderin Staining: No Scarring: No Mottled: No Pallor: No Moisture Rubor: No No Abnormalities Noted: No Dry / Scaly: No Temperature / Pain Maceration: No Temperature: No Abnormality Tenderness on Palpation: Yes Wound Preparation Ulcer Cleansing: Rinsed/Irrigated with Saline Topical Anesthetic Applied: Other: lidocaine 4%, Electronic Signature(s) Signed: 01/03/2017 5:12:01 PM By: Montey Hora Previous Signature: 01/02/2017 5:49:38 PM Version By: Montey Hora Entered By: Montey Hora on 01/03/2017 09:45:58 Heidenreich, Raul Del ([id number]) -------------------------------------------------------------------------------- [place] Details Patient Name: Mapps, Maleeha B. Date of Service: 01/02/2017 2:30 PM Medical Record Patient Account Number: [id number] Number: Treating RN: Montey Hora 1941/01/14 (75 y.o. Other Clinician: Date of Birth/Sex: Female) Treating ROBSON, MICHAEL Primary Care Del Wiseman: Frazier Richards Kashten Gowin/Extender: G Referring Dezman Granda: Jenelle Mages in Treatment: 1 Vital Signs Time Taken: 14:20 Temperature (F): 97.9 Pulse (bpm): 68 Respiratory Rate (breaths/min): 16 Blood Pressure (mmHg): 100/68 Reference Range: 80 - 120 mg / dl Electronic Signature(s) Signed: 01/02/2017 5:49:38 PM By: Montey Hora Entered By: Montey Hora on  01/02/2017 14:29:47 

## 2017-01-13 ENCOUNTER — Emergency Department

## 2017-01-13 ENCOUNTER — Encounter: Payer: Self-pay | Admitting: Emergency Medicine

## 2017-01-13 ENCOUNTER — Inpatient Hospital Stay

## 2017-01-13 ENCOUNTER — Other Ambulatory Visit: Payer: Self-pay

## 2017-01-13 ENCOUNTER — Inpatient Hospital Stay
Admission: EM | Admit: 2017-01-13 | Discharge: 2017-01-16 | DRG: 871 | Disposition: A | Discharge status: Skilled Nursing Facility | Attending: Internal Medicine | Admitting: Internal Medicine

## 2017-01-13 DIAGNOSIS — L89894 Pressure ulcer of other site, stage 4: Secondary | ICD-10-CM | POA: Diagnosis present

## 2017-01-13 DIAGNOSIS — L89152 Pressure ulcer of sacral region, stage 2: Secondary | ICD-10-CM | POA: Diagnosis present

## 2017-01-13 DIAGNOSIS — K219 Gastro-esophageal reflux disease without esophagitis: Secondary | ICD-10-CM | POA: Diagnosis present

## 2017-01-13 DIAGNOSIS — L89893 Pressure ulcer of other site, stage 3: Secondary | ICD-10-CM | POA: Diagnosis present

## 2017-01-13 DIAGNOSIS — R569 Unspecified convulsions: Secondary | ICD-10-CM | POA: Diagnosis present

## 2017-01-13 DIAGNOSIS — I248 Other forms of acute ischemic heart disease: Secondary | ICD-10-CM | POA: Diagnosis present

## 2017-01-13 DIAGNOSIS — G9340 Encephalopathy, unspecified: Secondary | ICD-10-CM | POA: Diagnosis not present

## 2017-01-13 DIAGNOSIS — N823 Fistula of vagina to large intestine: Secondary | ICD-10-CM | POA: Diagnosis present

## 2017-01-13 DIAGNOSIS — J690 Pneumonitis due to inhalation of food and vomit: Secondary | ICD-10-CM | POA: Diagnosis present

## 2017-01-13 DIAGNOSIS — F0150 Vascular dementia without behavioral disturbance: Secondary | ICD-10-CM | POA: Diagnosis present

## 2017-01-13 DIAGNOSIS — R0602 Shortness of breath: Secondary | ICD-10-CM

## 2017-01-13 DIAGNOSIS — I69398 Other sequelae of cerebral infarction: Secondary | ICD-10-CM

## 2017-01-13 DIAGNOSIS — I69351 Hemiplegia and hemiparesis following cerebral infarction affecting right dominant side: Secondary | ICD-10-CM

## 2017-01-13 DIAGNOSIS — Z7901 Long term (current) use of anticoagulants: Secondary | ICD-10-CM

## 2017-01-13 DIAGNOSIS — Z79899 Other long term (current) drug therapy: Secondary | ICD-10-CM | POA: Diagnosis not present

## 2017-01-13 DIAGNOSIS — Z96641 Presence of right artificial hip joint: Secondary | ICD-10-CM | POA: Diagnosis present

## 2017-01-13 DIAGNOSIS — E876 Hypokalemia: Secondary | ICD-10-CM | POA: Diagnosis present

## 2017-01-13 DIAGNOSIS — N3000 Acute cystitis without hematuria: Secondary | ICD-10-CM | POA: Diagnosis present

## 2017-01-13 DIAGNOSIS — X58XXXD Exposure to other specified factors, subsequent encounter: Secondary | ICD-10-CM | POA: Diagnosis present

## 2017-01-13 DIAGNOSIS — E785 Hyperlipidemia, unspecified: Secondary | ICD-10-CM | POA: Diagnosis present

## 2017-01-13 DIAGNOSIS — R05 Cough: Secondary | ICD-10-CM

## 2017-01-13 DIAGNOSIS — R059 Cough, unspecified: Secondary | ICD-10-CM

## 2017-01-13 DIAGNOSIS — Z87440 Personal history of urinary (tract) infections: Secondary | ICD-10-CM | POA: Diagnosis not present

## 2017-01-13 DIAGNOSIS — N390 Urinary tract infection, site not specified: Secondary | ICD-10-CM | POA: Diagnosis present

## 2017-01-13 DIAGNOSIS — S7291XD Unspecified fracture of right femur, subsequent encounter for closed fracture with routine healing: Secondary | ICD-10-CM

## 2017-01-13 DIAGNOSIS — L89153 Pressure ulcer of sacral region, stage 3: Secondary | ICD-10-CM | POA: Diagnosis present

## 2017-01-13 DIAGNOSIS — I482 Chronic atrial fibrillation: Secondary | ICD-10-CM | POA: Diagnosis present

## 2017-01-13 DIAGNOSIS — Z7401 Bed confinement status: Secondary | ICD-10-CM | POA: Diagnosis not present

## 2017-01-13 DIAGNOSIS — I639 Cerebral infarction, unspecified: Secondary | ICD-10-CM | POA: Diagnosis present

## 2017-01-13 DIAGNOSIS — Z66 Do not resuscitate: Secondary | ICD-10-CM | POA: Diagnosis present

## 2017-01-13 DIAGNOSIS — L89620 Pressure ulcer of left heel, unstageable: Secondary | ICD-10-CM | POA: Diagnosis present

## 2017-01-13 DIAGNOSIS — A419 Sepsis, unspecified organism: Principal | ICD-10-CM | POA: Diagnosis present

## 2017-01-13 LAB — BASIC METABOLIC PANEL
ANION GAP: 7 (ref 5–15)
BUN: 6 mg/dL (ref 6–20)
CALCIUM: 8.5 mg/dL — AB (ref 8.9–10.3)
CHLORIDE: 108 mmol/L (ref 101–111)
CO2: 24 mmol/L (ref 22–32)
Creatinine, Ser: 0.5 mg/dL (ref 0.44–1.00)
GFR calc non Af Amer: >60 mL/min (ref >60)
GLUCOSE: 78 mg/dL (ref 65–99)
POTASSIUM: 3.3 mmol/L — AB (ref 3.5–5.1)
Sodium: 139 mmol/L (ref 135–145)

## 2017-01-13 LAB — VALPROIC ACID LEVEL: VALPROIC ACID LVL: 13 ug/mL — AB (ref 50.0–100.0)

## 2017-01-13 LAB — URINALYSIS, COMPLETE (UACMP) WITH MICROSCOPIC
BILIRUBIN URINE: NEGATIVE
GLUCOSE, UA: NEGATIVE mg/dL
KETONES UR: NEGATIVE mg/dL
Nitrite: POSITIVE — AB
PH: 7 (ref 5.0–8.0)
Protein, ur: 30 mg/dL — AB
SPECIFIC GRAVITY, URINE: 1.014 (ref 1.005–1.030)

## 2017-01-13 LAB — TROPONIN I: Troponin I: 0.07 ng/mL (ref <0.03)

## 2017-01-13 LAB — HEPATIC FUNCTION PANEL
ALT: 13 U/L — AB (ref 14–54)
AST: 14 U/L — AB (ref 15–41)
Albumin: 2.7 g/dL — ABNORMAL LOW (ref 3.5–5.0)
Alkaline Phosphatase: 75 U/L (ref 38–126)
BILIRUBIN DIRECT: 0.2 mg/dL (ref 0.1–0.5)
BILIRUBIN INDIRECT: 0.9 mg/dL (ref 0.3–0.9)
Total Bilirubin: 1.1 mg/dL (ref 0.3–1.2)
Total Protein: 6.3 g/dL — ABNORMAL LOW (ref 6.5–8.1)

## 2017-01-13 LAB — INFLUENZA PANEL BY PCR (TYPE A & B)
INFLBPCR: NEGATIVE
Influenza A By PCR: NEGATIVE

## 2017-01-13 LAB — CBC
HEMATOCRIT: 35.2 % (ref 35.0–47.0)
HEMOGLOBIN: 11.4 g/dL — AB (ref 12.0–16.0)
MCH: 31.8 pg (ref 26.0–34.0)
MCHC: 32.5 g/dL (ref 32.0–36.0)
MCV: 97.9 fL (ref 80.0–100.0)
Platelets: 257 10*3/uL (ref 150–440)
RBC: 3.6 MIL/uL — ABNORMAL LOW (ref 3.80–5.20)
RDW: 15.5 % — AB (ref 11.5–14.5)
WBC: 5.7 10*3/uL (ref 3.6–11.0)

## 2017-01-13 LAB — LACTIC ACID, PLASMA
LACTIC ACID, VENOUS: 2 mmol/L — AB (ref 0.5–1.9)
Lactic Acid, Venous: 1.2 mmol/L (ref 0.5–1.9)

## 2017-01-13 MED ORDER — MEMANTINE HCL 10 MG PO TABS
10.0000 mg | ORAL_TABLET | Freq: Two times a day (BID) | ORAL | Status: DC
  Administered 2017-01-15 – 2017-01-16 (×3): 10 mg via ORAL
  Filled 2017-01-13 (×6): qty 1

## 2017-01-13 MED ORDER — DEXTROSE 5 % IV SOLN: 1.0000 g | Freq: Once | INTRAVENOUS | Status: DC

## 2017-01-13 MED ORDER — APIXABAN 5 MG PO TABS
5.0000 mg | ORAL_TABLET | Freq: Two times a day (BID) | ORAL | Status: DC
  Administered 2017-01-13 – 2017-01-16 (×4): 5 mg via ORAL
  Filled 2017-01-13 (×4): qty 1

## 2017-01-13 MED ORDER — KETOTIFEN FUMARATE 0.025 % OP SOLN
1.0000 [drp] | Freq: Every day | OPHTHALMIC | Status: DC | PRN
  Filled 2017-01-13: qty 5

## 2017-01-13 MED ORDER — COLLAGENASE 250 UNIT/GM EX OINT
1.0000 "application " | TOPICAL_OINTMENT | CUTANEOUS | Status: DC
  Filled 2017-01-13: qty 30

## 2017-01-13 MED ORDER — SODIUM CHLORIDE 0.9 % IV SOLN
1000.0000 mg | Freq: Two times a day (BID) | INTRAVENOUS | Status: DC
  Administered 2017-01-13 – 2017-01-15 (×4): 1000 mg via INTRAVENOUS
  Filled 2017-01-13 (×5): qty 10

## 2017-01-13 MED ORDER — GUAIFENESIN 100 MG/5ML PO SYRP
100.0000 mg | ORAL_SOLUTION | Freq: Three times a day (TID) | ORAL | Status: DC | PRN
  Filled 2017-01-13: qty 5

## 2017-01-13 MED ORDER — POLYETHYLENE GLYCOL 3350 17 G PO PACK
17.0000 g | PACK | Freq: Every day | ORAL | Status: DC
  Administered 2017-01-15: 17 g via ORAL
  Filled 2017-01-13: qty 1

## 2017-01-13 MED ORDER — SIMVASTATIN 20 MG PO TABS
20.0000 mg | ORAL_TABLET | Freq: Every day | ORAL | Status: DC
  Administered 2017-01-13 – 2017-01-15 (×2): 20 mg via ORAL
  Filled 2017-01-13 (×3): qty 1

## 2017-01-13 MED ORDER — POTASSIUM CHLORIDE IN NACL 20-0.9 MEQ/L-% IV SOLN
INTRAVENOUS | Status: DC
  Administered 2017-01-13 – 2017-01-14 (×2): via INTRAVENOUS
  Filled 2017-01-13 (×3): qty 1000

## 2017-01-13 MED ORDER — FUROSEMIDE 20 MG PO TABS
20.0000 mg | ORAL_TABLET | Freq: Every day | ORAL | Status: DC
  Administered 2017-01-15 – 2017-01-16 (×2): 20 mg via ORAL
  Filled 2017-01-13 (×2): qty 1

## 2017-01-13 MED ORDER — CEFTRIAXONE SODIUM-DEXTROSE 1-3.74 GM-% IV SOLR
1.0000 g | Freq: Once | INTRAVENOUS | Status: AC
  Administered 2017-01-13: 1 g via INTRAVENOUS
  Filled 2017-01-13: qty 50

## 2017-01-13 MED ORDER — MEGESTROL ACETATE 40 MG PO TABS: 40.0000 mg | ORAL_TABLET | Freq: Every day | ORAL | Status: DC

## 2017-01-13 MED ORDER — TRAMADOL HCL 50 MG PO TABS
50.0000 mg | ORAL_TABLET | Freq: Four times a day (QID) | ORAL | Status: DC | PRN
  Administered 2017-01-14 – 2017-01-15 (×2): 50 mg via ORAL
  Filled 2017-01-13 (×2): qty 1

## 2017-01-13 MED ORDER — ONDANSETRON HCL 4 MG/2ML IJ SOLN: 4.0000 mg | Freq: Four times a day (QID) | INTRAMUSCULAR | Status: DC | PRN

## 2017-01-13 MED ORDER — PRO-STAT SUGAR FREE PO LIQD
30.0000 mL | Freq: Every day | ORAL | Status: DC
  Administered 2017-01-15: 30 mL via ORAL

## 2017-01-13 MED ORDER — SENNOSIDES-DOCUSATE SODIUM 8.6-50 MG PO TABS
1.0000 | ORAL_TABLET | Freq: Every day | ORAL | Status: DC
  Administered 2017-01-15 – 2017-01-16 (×2): 1 via ORAL
  Filled 2017-01-13 (×2): qty 1

## 2017-01-13 MED ORDER — SODIUM CHLORIDE 0.9 % IV BOLUS (SEPSIS)
1000.0000 mL | Freq: Once | INTRAVENOUS | Status: AC
  Administered 2017-01-13: 1000 mL via INTRAVENOUS

## 2017-01-13 MED ORDER — QUETIAPINE FUMARATE 25 MG PO TABS
25.0000 mg | ORAL_TABLET | Freq: Every day | ORAL | Status: DC
  Administered 2017-01-13 – 2017-01-15 (×2): 25 mg via ORAL
  Filled 2017-01-13 (×2): qty 1

## 2017-01-13 MED ORDER — LEVOFLOXACIN 500 MG PO TABS
250.0000 mg | ORAL_TABLET | Freq: Every day | ORAL | Status: DC
  Administered 2017-01-13: 250 mg via ORAL
  Filled 2017-01-13: qty 1

## 2017-01-13 MED ORDER — PANTOPRAZOLE SODIUM 40 MG PO TBEC
40.0000 mg | DELAYED_RELEASE_TABLET | Freq: Every day | ORAL | Status: DC
  Administered 2017-01-15: 40 mg via ORAL
  Filled 2017-01-13: qty 1

## 2017-01-13 MED ORDER — DONEPEZIL HCL 5 MG PO TABS: 5.0000 mg | ORAL_TABLET | Freq: Every day | ORAL | Status: DC

## 2017-01-13 MED ORDER — ENSURE ENLIVE PO LIQD
237.0000 mL | Freq: Two times a day (BID) | ORAL | Status: DC
  Administered 2017-01-15: 237 mL via ORAL

## 2017-01-13 MED ORDER — DILTIAZEM HCL 60 MG PO TABS
30.0000 mg | ORAL_TABLET | Freq: Four times a day (QID) | ORAL | Status: DC
  Administered 2017-01-13 – 2017-01-15 (×4): 30 mg via ORAL
  Filled 2017-01-13 (×6): qty 1

## 2017-01-13 MED ORDER — ACETAMINOPHEN 325 MG PO TABS: 650.0000 mg | ORAL_TABLET | Freq: Four times a day (QID) | ORAL | Status: DC | PRN

## 2017-01-13 MED ORDER — SODIUM CHLORIDE 0.9 % IV SOLN
500.0000 mg | Freq: Once | INTRAVENOUS | Status: AC
  Administered 2017-01-13: 500 mg via INTRAVENOUS
  Filled 2017-01-13: qty 5

## 2017-01-13 MED ORDER — CEFTRIAXONE SODIUM-DEXTROSE 2-2.22 GM-% IV SOLR
2.0000 g | INTRAVENOUS | Status: DC
  Administered 2017-01-14: 2 g via INTRAVENOUS
  Filled 2017-01-13 (×4): qty 50

## 2017-01-13 MED ORDER — ONDANSETRON HCL 4 MG PO TABS: 4.0000 mg | ORAL_TABLET | Freq: Four times a day (QID) | ORAL | Status: DC | PRN

## 2017-01-13 MED ORDER — DIVALPROEX SODIUM 125 MG PO CSDR
125.0000 mg | DELAYED_RELEASE_CAPSULE | Freq: Every day | ORAL | Status: DC
  Administered 2017-01-13 – 2017-01-15 (×2): 125 mg via ORAL
  Filled 2017-01-13 (×4): qty 1

## 2017-01-13 MED ORDER — SODIUM CHLORIDE 0.9% FLUSH
3.0000 mL | Freq: Two times a day (BID) | INTRAVENOUS | Status: DC
  Administered 2017-01-13 – 2017-01-16 (×5): 3 mL via INTRAVENOUS

## 2017-01-13 MED ORDER — ACETAMINOPHEN 650 MG RE SUPP: 650.0000 mg | Freq: Four times a day (QID) | RECTAL | Status: DC | PRN

## 2017-01-13 NOTE — Progress Notes (Addendum)
Pharmacy Antibiotic Note  [name] is a 76 y.o. female admitted on 01/13/2017 with UTI.  Pharmacy has been consulted for Levofloxacin  Dosing. Patient received ceftriaxone 1gm IV x 1 dose in ED.   Plan: Will start the patient on levofloxacin 250mg . Recommended duration of therapy is 3 days.   New Black Box Warning for FQs: "Reserve fluoroquinolone treatment for patients without alternative options." Patient does not have any allergies- recommend PCN in place of fluoroquinolone.    Height: 5' 0.5" (153.7 cm) Weight: 225 lb (102.1 kg) IBW/kg (Calculated) : 46.65  Temp (24hrs), Avg:98.1 F (36.7 C), Min:98.1 F (36.7 C), Max:98.1 F (36.7 C)   Recent Labs Lab 01/13/17 1445 01/13/17 1538  WBC 5.7  --   CREATININE 0.50  --   LATICACIDVEN  --  1.2    Estimated Creatinine Clearance: 64.1 mL/min (by C-G formula based on SCr of 0.5 mg/dL).    No Known Allergies  Antimicrobials this admission: 2/25 levofloxacin  >> ceftriaxone  Dose adjustments this admission: Per consult abx changed to ceftriaxone 2 grams q 24 hours.  Microbiology results: 2/25 BCx: pending  2/25 UCx: pending   Thank you for allowing pharmacy to be a part of this patient's care.  Pernell Dupre, PharmD, BCPS Clinical Pharmacist 01/13/2017 8:55 PM

## 2017-01-13 NOTE — ED Notes (Signed)
Attempted to call report, [employer], [employer]), unavailable at this time. ASCOM # given, and she will return my call.

## 2017-01-13 NOTE — ED Notes (Signed)
Attempted to call report to 2A for pt. Informed that RN unable to take report at this time. ASCOM# for primary nurse given.

## 2017-01-13 NOTE — ED Provider Notes (Signed)
[facility] Emergency Department Provider Note  ____________________________________________   First MD Initiated Contact with Patient 01/13/17 1502     (approximate)  I have reviewed the triage vital signs and the nursing notes.   HISTORY  Chief Complaint Seizures  History is limited by the patient's altered mental status.  HPI [name] is a 76 y.o. female who is brought to the emergency department via EMS for a possible seizure at her nursing home. History obtained primarily from the patient's son at bedside. The patient has a remote history of stroke 8 years ago with residual right-sided deficits and has had 2 lifetime seizures since. Her only antiepileptic medication is Keppra. Today her son came to visit her at the nursing home and saw that she was drooling and foaming from the right side of her mouth although behaving normally and he asked the nurse that the patient had had a seizure. The nurse at the nursing home said that she may have this with the patient's son became upset and called 911. She said she is behaving normally now aside from some facial tics.   Past Medical History:  Diagnosis Date  . A-fib ([place]) 02/14/2015  . Arthritis   . Atrial fibrillation ([place])   . Colovaginal fistula   . Complication of anesthesia    DIFFICULTY WAKING   . CVA (cerebral infarction) 02/14/2015  . Dementia   . GERD without esophagitis 02/14/2015  . Glucose intolerance (impaired glucose tolerance)   . Gout   . Hyperlipidemia   . Recurrent UTI   . Seizure ([place]) 02/14/2015    Patient Active Problem List   Diagnosis Date Noted  . Palliative care encounter   . Goals of care, counseling/discussion   . DNR (do not resuscitate) discussion   . Sepsis ([place]) 10/31/2016  . Pressure injury of skin 10/31/2016  . Allergic state 10/05/2016  . Arthritis 10/05/2016  . Glucose intolerance (impaired glucose tolerance) 10/05/2016  . History of gout 10/05/2016  .  Hyperlipidemia, unspecified 10/05/2016  . Stroke ([place]) 10/05/2016  . Delirium due to another medical condition 07/09/2016  . 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 Fracture, tibial plateau 04/01/2015  . Fall   . Knee fracture, left 03/29/2015  . UTI (lower urinary tract infection) 03/29/2015  . Mental status, decreased 02/14/2015  . A-fib ([place]) 02/14/2015  . CVA (cerebral infarction) 02/14/2015  . GERD (gastroesophageal reflux disease) 02/14/2015  . Seizure ([place]) 02/14/2015  . Dementia 02/14/2015  . GERD without esophagitis 02/14/2015  . History of CVA (cerebrovascular accident)   . Long term current use of anticoagulant therapy 08/08/2011    Past Surgical History:  Procedure Laterality Date  . BUNIONECTOMY Right   . CHOLECYSTECTOMY    . Right total hip arthroplasty  1998  . Right total hip revision arthroplasty for a periprosthetic fracture  2007    Prior to Admission medications   Medication Sig Start Date End Date Taking? Authorizing Provider  acetaminophen (TYLENOL) 325 MG tablet Take 2 tablets (650 mg total) by mouth every 6 (six) hours as needed for mild pain (or Fever >/= 101). 11/05/16   Lytle Butte, MD  Amino Acids-Protein Hydrolys (FEEDING SUPPLEMENT, PRO-STAT 64,) LIQD Take 30 mLs by mouth daily.    Historical Provider, MD  apixaban (ELIQUIS) 5 MG TABS tablet Take 1 tablet (5 mg total) by mouth 2 (two) times daily. 04/01/15   Reyne Dumas, MD  ascorbic acid (VITAMIN C) 500 MG tablet Take 500 mg by mouth  daily.    Historical Provider, MD  diltiazem (CARDIZEM) 30 MG tablet Take 1 tablet (30 mg total) by mouth every 6 (six) hours. 11/05/16 12/05/16  Lytle Butte, MD  divalproex (DEPAKOTE SPRINKLES) 125 MG capsule Take 125 mg by mouth at bedtime.    Historical Provider, MD  donepezil (ARICEPT) 5 MG tablet Take 1 tablet (5 mg total) by mouth at bedtime. Patient taking differently: Take 10 mg by mouth at bedtime.  04/01/15   Reyne Dumas, MD  esomeprazole (NEXIUM) 20 MG capsule  Take 20 mg by mouth daily at 12 noon.    Historical Provider, MD  feeding supplement, ENSURE ENLIVE, (ENSURE ENLIVE) LIQD Take 237 mLs by mouth 2 (two) times daily between meals. 11/05/16   Lytle Butte, MD  furosemide (LASIX) 20 MG tablet Take 20 mg by mouth daily.      Historical Provider, MD  guaifenesin (ROBITUSSIN) 100 MG/5ML syrup Take 100 mg by mouth 3 (three) times daily as needed for cough.    Historical Provider, MD  ketotifen (ZADITOR) 0.025 % ophthalmic solution Place 1 drop into both eyes daily as needed (for dry eyes).    Historical Provider, MD  levETIRAcetam (KEPPRA) 500 MG tablet Take 500 mg by mouth 2 (two) times daily.     Historical Provider, MD  megestrol (MEGACE) 40 MG tablet Take 40 mg by mouth daily.    Historical Provider, MD  megestrol (MEGACE) 40 MG tablet Take 1 tablet (40 mg total) by mouth daily. 11/06/16   Lytle Butte, MD  memantine (NAMENDA) 10 MG tablet Take 10 mg by mouth 2 (two) times daily.    Historical Provider, MD  polyethylene glycol (MIRALAX) packet Take 17 g by mouth daily. 04/01/15   Reyne Dumas, MD  potassium chloride (KLOR-CON) 10 MEQ CR tablet Take 10 mEq by mouth daily.      Historical Provider, MD  QUEtiapine (SEROQUEL) 50 MG tablet Take 1 tablet (50 mg total) by mouth at bedtime. 04/01/15   Reyne Dumas, MD  senna-docusate (SENOKOT S) 8.6-50 MG tablet Take 1 tablet by mouth daily. 10/23/16   Rudene Re, MD  simvastatin (ZOCOR) 20 MG tablet Take 20 mg by mouth daily.    Historical Provider, MD  traMADol (ULTRAM) 50 MG tablet Take 1 tablet (50 mg total) by mouth every 6 (six) hours as needed for moderate pain or severe pain. 11/05/16   Lytle Butte, MD    Allergies Patient has no known allergies.  Family History  Problem Relation Age of Onset  . Atrial fibrillation Sister     Social History Social History  Substance Use Topics  . Smoking status: Never Smoker  . Smokeless tobacco: Never Used  . Alcohol use No    Review of  Systems Level V exemption the patient's history is limited by her dementia 10-point ROS otherwise negative.  ____________________________________________   PHYSICAL EXAM:  VITAL SIGNS: ED Triage Vitals [01/13/17 1435]  Enc Vitals Group     BP      Pulse      Resp      Temp      Temp src      SpO2      Weight 225 lb (102.1 kg)     Height 5' 0.5" (1.537 m)     Head Circumference      Peak Flow      Pain Score      Pain Loc      Pain Edu?  Excl. in [place]?     Constitutional: Chronically ill-appearing speaks in short sentences Eyes: Conjunctivae are normal. PERRL. EOMI. Head: Atraumatic. Nose: No congestion/rhinnorhea. Mouth/Throat: Mucous membranes are moist.  Oropharynx non-erythematous. Neck: No stridor.   Cardiovascular: Tachycardic rate, regular rhythm. Grossly normal heart sounds.  Good peripheral circulation. Respiratory: Crackles throughout with slightly elevated respiratory rate Gastrointestinal: Soft and nontender. No distention. No abdominal bruits. No CVA tenderness. Musculoskeletal: No lower extremity tenderness nor edema.  No joint effusions. Neurologic:  Right facial droop and contracture on the right side Skin:  Skin is warm, dry and intact. No rash noted. Psychiatric: Demented  ____________________________________________   LABS (all labs ordered are listed, but only abnormal results are displayed)  Labs Reviewed  VALPROIC ACID LEVEL - Abnormal; Notable for the following:       Result Value   Valproic Acid Lvl 13 (*)    All other components within normal limits  CBC - Abnormal; Notable for the following:    RBC 3.60 (*)    Hemoglobin 11.4 (*)    RDW 15.5 (*)    All other components within normal limits  BASIC METABOLIC PANEL - Abnormal; Notable for the following:    Potassium 3.3 (*)    Calcium 8.5 (*)    All other components within normal limits  URINALYSIS, COMPLETE (UACMP) WITH MICROSCOPIC - Abnormal; Notable for the following:    Color,  Urine AMBER (*)    APPearance CLOUDY (*)    Hgb urine dipstick SMALL (*)    Protein, ur 30 (*)    Nitrite POSITIVE (*)    Leukocytes, UA LARGE (*)    Bacteria, UA FEW (*)    Squamous Epithelial / LPF 0-5 (*)    All other components within normal limits  URINE CULTURE  LACTIC ACID, PLASMA  LACTIC ACID, PLASMA  INFLUENZA PANEL BY PCR (TYPE A & B)  HEPATIC FUNCTION PANEL  TROPONIN I  LEVETIRACETAM LEVEL  CBG MONITORING, ED   ____________________________________________  EKG  ED ECG REPORT I, Darel Hong, the attending physician, personally viewed and interpreted this ECG.  Date: 01/13/2017 Rate: 121 Rhythm: Atrial fibrillation with rapid ventricular response QRS Axis: Rightward axis Intervals: Prolonged QTC ST/T Wave abnormalities: normal Conduction Disturbances: none Narrative Interpretation: Abnormal EKG  ____________________________________________  RADIOLOGY  CT head and chest x-ray are negative for acute pathology ____________________________________________   PROCEDURES  Procedure(s) performed: no  Procedures  Critical Care performed: Yes  CRITICAL CARE Performed by: Darel Hong   Total critical care time: 35 minutes  Critical care time was exclusive of separately billable procedures and treating other patients.  Critical care was necessary to treat or prevent imminent or life-threatening deterioration.  Critical care was time spent personally by me on the following activities: development of treatment plan with patient and/or surrogate as well as nursing, discussions with consultants, evaluation of patient's response to treatment, examination of patient, obtaining history from patient or surrogate, ordering and performing treatments and interventions, ordering and review of laboratory studies, ordering and review of radiographic studies, pulse oximetry and re-evaluation of patient's  condition.   ____________________________________________   INITIAL IMPRESSION / ASSESSMENT AND PLAN / ED COURSE  Pertinent labs & imaging results that were available during my care of the patient were reviewed by me and considered in my medical decision making (see chart for details).  ----------------------------------------- 3:19 PM on 01/13/2017 -----------------------------------------  On arrival the patient appears chronically ill but is tachycardic and with some facial tremors and spasms. Differential  is broad and includes intracerebral process, infection, etc. Broad workup is pending.  ----------------------------------------- 4:07 PM on 01/13/2017 -----------------------------------------  The patient's urine is cloudy concerning for infection. She is tachycardic and now hypotensive to 80s over 60s. Her current weight is 225 pounds and at 5 feet 5 inches her BMI is 37 so we need to use ideal body weight for her sepsis.  Her ideal body weight is 57kg and 30cc/kg will be 1.71L of normal saline.     ----------------------------------------- 4:57 PM on 01/13/2017 -----------------------------------------  As the patient had a breakthrough seizure in the setting of an active infection she requires inpatient admission. I discussed the case with the on-call hospitalist Dr. Tressia Miners who has graciously agreed to admit the patient to her service.  ____________________________________________   FINAL CLINICAL IMPRESSION(S) / ED DIAGNOSES  Final diagnoses:  Sepsis, due to unspecified organism ([place])  Acute cystitis without hematuria  Seizure ([place])      NEW MEDICATIONS STARTED DURING THIS VISIT:  New Prescriptions   No medications on file     Note:  This document was prepared using Dragon voice recognition software and may include unintentional dictation errors.     Darel Hong, MD 01/13/17 1700

## 2017-01-13 NOTE — ED Notes (Signed)
Admitting MD at bedside.

## 2017-01-13 NOTE — ED Triage Notes (Signed)
Pt comes into the ED via EMS from [facility] where they called out for an unwitnessed seizure.  Per facility she has had a recent change in her Keppra.  Patient has h/o stroke with generalized weakness, multiple ulcers, and a-fib, and dementia.  Patient's family are the ones who wanted EMS called.

## 2017-01-13 NOTE — H&P (Signed)
PCP:   Kirk Ruths., MD   Chief Complaint:  seizures  HPI: This is a 76 year old female resident of [facility] home. 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 She's had one seizure in the past, she is maintained on Keppra. Per her sister the patient has been having small seizures the past week and a half. Her seizures are manifested by small withdrawing, contractions or staring. They had informed the nursing. Her urine has developed a foul oder and the nursing staff was informed. It is unclear if antibiotics were started. Her Keppra was increased, increased dosing to start last night. The patient has had some increased confusion. Today she had seizure. Her son who visits daily came in and found her foaming at the mouth and poorly responsive. She was sent to the ER.   At bedside multiple family members are present. She has a very involved the family, a sister who visits daily and a son who visits 2-3 times daily at nursing home.  The patient has a sacral decub, rectovaginal fistula, right femur fracture inoperable, large ulcer with ligaments visible on the lateral side off right calf. She saw orthopedics on Thursday last week. The patient is bedbound.  During my interview all information provided by family members. Patient was poorly communicative, likely postictal with dementia. At baseline per family the patient communicates only when inclined. They stae she is not confused most of the time. She eats a regular diet and feeds her self, thin liquids.  The patient has audible congestion, gargling during the interview.  Review of Systems:  The patient denies anorexia, fever, weight loss,, vision loss, decreased hearing, hoarseness, chest pain, syncope, dyspnea on exertion, peripheral edema, balance deficits, hemoptysis, abdominal pain, melena, hematochezia, severe indigestion/heartburn, hematuria, incontinence, seizures, genital sores, muscle weakness, suspicious skin lesions,  transient blindness, difficulty walking, depression, unusual weight change, abnormal bleeding, enlarged lymph nodes, angioedema, and breast masses.  Past Medical History: Past Medical History:  Diagnosis Date  . A-fib ([place]) 02/14/2015  . Arthritis   . Atrial fibrillation ([place])   . Colovaginal fistula   . Complication of anesthesia    DIFFICULTY WAKING   . CVA (cerebral infarction) 02/14/2015  . Dementia   . GERD without esophagitis 02/14/2015  . Glucose intolerance (impaired glucose tolerance)   . Gout   . Hyperlipidemia   . Recurrent UTI   . Seizure ([place]) 02/14/2015   Past Surgical History:  Procedure Laterality Date  . BUNIONECTOMY Right   . CHOLECYSTECTOMY    . Right total hip arthroplasty  1998  . Right total hip revision arthroplasty for a periprosthetic fracture  2007    Medications: Prior to Admission medications   Medication Sig Start Date End Date Taking? Authorizing Provider  acetaminophen (TYLENOL) 325 MG tablet Take 2 tablets (650 mg total) by mouth every 6 (six) hours as needed for mild pain (or Fever >/= 101). 11/05/16  Yes Lytle Butte, MD  apixaban (ELIQUIS) 5 MG TABS tablet Take 1 tablet (5 mg total) by mouth 2 (two) times daily. 04/01/15  Yes Reyne Dumas, MD  ascorbic acid (VITAMIN C) 500 MG tablet Take 500 mg by mouth daily.   Yes Historical Provider, MD  collagenase (SANTYL) ointment Apply 1 application topically every other day. To left heel   Yes Historical Provider, MD  diltiazem (CARDIZEM) 30 MG tablet Take 1 tablet (30 mg total) by mouth every 6 (six) hours. 11/05/16 01/13/17 Yes Lytle Butte, MD  divalproex (DEPAKOTE SPRINKLES)  125 MG capsule Take 125 mg by mouth at bedtime.   Yes Historical Provider, MD  esomeprazole (NEXIUM) 20 MG capsule Take 20 mg by mouth daily at 12 noon.   Yes Historical Provider, MD  furosemide (LASIX) 20 MG tablet Take 20 mg by mouth daily.     Yes Historical Provider, MD  guaifenesin (ROBITUSSIN) 100 MG/5ML syrup Take 100 mg by  mouth 3 (three) times daily as needed for cough.   Yes Historical Provider, MD  ketotifen (ZADITOR) 0.025 % ophthalmic solution Place 1 drop into both eyes daily as needed (for dry eyes).   Yes Historical Provider, MD  levETIRAcetam (KEPPRA) 100 MG/ML solution Take 500 mg by mouth 2 (two) times daily.   Yes Historical Provider, MD  mineral oil enema Place 1 enema rectally as needed for severe constipation.   Yes Historical Provider, MD  polyethylene glycol (MIRALAX) packet Take 17 g by mouth daily. 04/01/15  Yes Reyne Dumas, MD  potassium chloride (KLOR-CON) 10 MEQ CR tablet Take 10 mEq by mouth daily.     Yes Historical Provider, MD  QUEtiapine (SEROQUEL) 50 MG tablet Take 1 tablet (50 mg total) by mouth at bedtime. Patient taking differently: Take 25 mg by mouth at bedtime.  04/01/15  Yes Reyne Dumas, MD  senna-docusate (SENOKOT S) 8.6-50 MG tablet Take 1 tablet by mouth daily. 10/23/16  Yes Rudene Re, MD  simvastatin (ZOCOR) 20 MG tablet Take 20 mg by mouth daily.   Yes Historical Provider, MD  sulfamethoxazole-trimethoprim (BACTRIM DS,SEPTRA DS) 800-160 MG tablet Take 1 tablet by mouth daily. 01/12/17  Yes Historical Provider, MD  traMADol (ULTRAM) 50 MG tablet Take 1 tablet (50 mg total) by mouth every 6 (six) hours as needed for moderate pain or severe pain. 11/05/16  Yes Lytle Butte, MD  Amino Acids-Protein Hydrolys (FEEDING SUPPLEMENT, PRO-STAT 64,) LIQD Take 30 mLs by mouth daily.    Historical Provider, MD  donepezil (ARICEPT) 5 MG tablet Take 1 tablet (5 mg total) by mouth at bedtime. Patient not taking: Reported on 01/13/2017 04/01/15   Reyne Dumas, MD  feeding supplement, ENSURE ENLIVE, (ENSURE ENLIVE) LIQD Take 237 mLs by mouth 2 (two) times daily between meals. 11/05/16   Lytle Butte, MD  levETIRAcetam (KEPPRA) 500 MG tablet Take 500 mg by mouth 2 (two) times daily.     Historical Provider, MD  megestrol (MEGACE) 40 MG tablet Take 1 tablet (40 mg total) by mouth  daily. Patient not taking: Reported on 01/13/2017 11/06/16   Lytle Butte, MD  memantine (NAMENDA) 10 MG tablet Take 10 mg by mouth 2 (two) times daily.    Historical Provider, MD    Allergies:  No Known Allergies  Social History:  reports that she has never smoked. She has never used smokeless tobacco. She reports that she does not drink alcohol or use drugs.  Family History: Family History  Problem Relation Age of Onset  . Atrial fibrillation Sister     Physical Exam: Vitals:   01/13/17 1600 01/13/17 1630 01/13/17 1700 01/13/17 1800  BP: (!) 85/62 (!) 110/95 110/86 (!) 104/93  Pulse: (!) 50 97 (!) 112 60  Resp: (!) 21 (!) 35 17 (!) 26  SpO2: 100% 100% 100% 100%  Weight:      Height:        General:  Post ictal patient, Eyes: PERRLA, pink conjunctiva, no scleral icterus ENT: Moist oral mucosa, neck supple, no thyromegaly Lungs: clear to ascultation, no wheeze, mild rhonchi,  no crackles, no use of accessory muscles Cardiovascular: regular rate and rhythm, no regurgitation, no gallops, no murmurs. No carotid bruits, no JVD Abdomen: soft, positive BS, non-tender, non-distended, no organomegaly, not an acute abdomen GU: not examined Neuro: CN II - XII grossly intact, sensation intact Musculoskeletal: bedbound female, strength 0/5 B/L LE, difficult to assess as patient post ictal Skin: no rash, stage 2 sacral decub, large left heel ulcer, large right calf decub ligaments seen Psych: postictal patient   Labs on Admission:   Recent Labs  01/13/17 1445  NA 139  K 3.3*  CL 108  CO2 24  GLUCOSE 78  BUN 6  CREATININE 0.50  CALCIUM 8.5*    Recent Labs  01/13/17 1538  AST 14*  ALT 13*  ALKPHOS 75  BILITOT 1.1  PROT 6.3*  ALBUMIN 2.7*   No results for input(s): LIPASE, AMYLASE in the last 72 hours.  Recent Labs  01/13/17 1445  WBC 5.7  HGB 11.4*  HCT 35.2  MCV 97.9  PLT 257    Recent Labs  01/13/17 1538  TROPONINI 0.07*   Invalid input(s):  POCBNP No results for input(s): DDIMER in the last 72 hours. No results for input(s): HGBA1C in the last 72 hours. No results for input(s): CHOL, HDL, LDLCALC, TRIG, CHOLHDL, LDLDIRECT in the last 72 hours. No results for input(s): TSH, T4TOTAL, T3FREE, THYROIDAB in the last 72 hours.  Invalid input(s): FREET3 No results for input(s): VITAMINB12, FOLATE, FERRITIN, TIBC, IRON, RETICCTPCT in the last 72 hours.  Micro Results: No results found for this or any previous visit (from the past 240 hour(s)).   Radiological Exams on Admission: Ct Head Wo Contrast  Result Date: 01/13/2017 CLINICAL DATA:  Seizure today. History of prior seizures and stroke. EXAM: CT HEAD WITHOUT CONTRAST TECHNIQUE: Contiguous axial images were obtained from the base of the skull through the vertex without intravenous contrast. COMPARISON:  02/14/2015. FINDINGS: Brain: No evidence of acute infarction, hemorrhage, hydrocephalus, extra-axial collection or mass lesion/mass effect. There is encephalomalacia involving the lateral right frontal lobe reflecting an old infarct. There is mild ex vacuo dilation of the right lateral ventricle associated with this. Vascular: No hyperdense vessel or unexpected calcification. Skull: Normal. Negative for fracture or focal lesion. Sinuses/Orbits: Globes and orbits are unremarkable. There is mild-to-moderate left maxillary sinus mucosal thickening with a small amount of dependent fluid. Remaining visualized sinuses are clear. Clear mastoid air cells. Other: None. IMPRESSION: 1. No acute intracranial abnormalities. 2. Old right MCA distribution infarct. Electronically Signed   By: Lajean Manes M.D.   On: 01/13/2017 15:46   Dg Chest Port 1 View  Result Date: 01/13/2017 CLINICAL DATA:  Pt comes into the ED via EMS from [facility] where they called out for an unwitnessed seizure. Per facility she has had a recent change in her Keppra. Hx - stroke with generalized weakness, a-fib, and  dementia. Non-smoker EXAM: PORTABLE CHEST 1 VIEW COMPARISON:  10/31/2016 FINDINGS: Lung volumes are low. There is mild atelectasis above the left hemidiaphragm. Lungs are otherwise clear. Cardiac silhouette is mildly enlarged. No mediastinal or hilar masses. No convincing pleural effusion.  No pneumothorax. Skeletal structures are demineralized but grossly intact. IMPRESSION: No acute cardiopulmonary disease.  No change from the prior study. Electronically Signed   By: Lajean Manes M.D.   On: 01/13/2017 15:37    Assessment/Plan Present on Admission: . UTI (urinary tract infection) -Admit to med telemetry -Urine cultures and blood cultures ordered -IV Cipro ordered  Seizures -  Likely due to above. Infection decreased seizures threshold -Continue IV Changed to IV until patient is more alert. -Treat underlying symptoms.  Hypokalemia -Replete IV  Possible aspiration pneumonia -Chest x-ray is negative but patient with rhonchi and audible congestion during interview  -Duonebs ordered. Patient on IV Cipro for UTI -Nothing by mouth, swallow evaluation with patient more aware. Patient's decreased mentation most likely postictal  Sacral decubitus stage 2 POA Left heel eschar POA Large right calf decub at ankle, smaller ulcer noted above POA -wound care consult, continue santyl -Frequent turns. -Patient's orthopedics last week. Family informed that patient currently not a surgical candidate due to poor nutritional status and health. Improve nutritional encouraged. Wound care consulted  Right femur fracture -Aware  Atrial fibrillation -chronic anticoggulation -Continue Cardizem  Elevated troponin -Chronic. 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 Hyperlipidemia, unspecified -Aware.  statin held  [place] on board, per family not because of imminent death but for additional assistance.   Code status: DNR   Shelisha Gautier 01/13/2017, 6:10 PM

## 2017-01-14 ENCOUNTER — Inpatient Hospital Stay

## 2017-01-14 DIAGNOSIS — R569 Unspecified convulsions: Secondary | ICD-10-CM

## 2017-01-14 DIAGNOSIS — G9340 Encephalopathy, unspecified: Secondary | ICD-10-CM

## 2017-01-14 LAB — CBC
HEMATOCRIT: 33 % — AB (ref 35.0–47.0)
HEMOGLOBIN: 10.6 g/dL — AB (ref 12.0–16.0)
MCH: 32 pg (ref 26.0–34.0)
MCHC: 32.2 g/dL (ref 32.0–36.0)
MCV: 99.1 fL (ref 80.0–100.0)
Platelets: 245 10*3/uL (ref 150–440)
RBC: 3.33 MIL/uL — ABNORMAL LOW (ref 3.80–5.20)
RDW: 15.3 % — AB (ref 11.5–14.5)
WBC: 5.8 10*3/uL (ref 3.6–11.0)

## 2017-01-14 LAB — BASIC METABOLIC PANEL
Anion gap: 6 (ref 5–15)
BUN: 5 mg/dL — ABNORMAL LOW (ref 6–20)
CHLORIDE: 114 mmol/L — AB (ref 101–111)
CO2: 20 mmol/L — ABNORMAL LOW (ref 22–32)
Calcium: 7.8 mg/dL — ABNORMAL LOW (ref 8.9–10.3)
Creatinine, Ser: 0.47 mg/dL (ref 0.44–1.00)
GFR calc Af Amer: >60 mL/min (ref >60)
GFR calc non Af Amer: >60 mL/min (ref >60)
Glucose, Bld: 69 mg/dL (ref 65–99)
POTASSIUM: 3.4 mmol/L — AB (ref 3.5–5.1)
SODIUM: 140 mmol/L (ref 135–145)

## 2017-01-14 LAB — GLUCOSE, CAPILLARY
GLUCOSE-CAPILLARY: 61 mg/dL — AB (ref 65–99)
GLUCOSE-CAPILLARY: 66 mg/dL (ref 65–99)
GLUCOSE-CAPILLARY: 69 mg/dL (ref 65–99)
GLUCOSE-CAPILLARY: 145 mg/dL — AB (ref 65–99)
GLUCOSE-CAPILLARY: 75 mg/dL (ref 65–99)

## 2017-01-14 LAB — LACTIC ACID, PLASMA: Lactic Acid, Venous: 1.8 mmol/L (ref 0.5–1.9)

## 2017-01-14 LAB — MRSA PCR SCREENING: MRSA by PCR: NEGATIVE

## 2017-01-14 LAB — AMMONIA: Ammonia: 36 umol/L — ABNORMAL HIGH (ref 9–35)

## 2017-01-14 LAB — LEVETIRACETAM LEVEL: LEVETIRACETAM: 26.4 ug/mL (ref 10.0–40.0)

## 2017-01-14 MED ORDER — SODIUM CHLORIDE 0.9 % IV SOLN
30.0000 meq | Freq: Once | INTRAVENOUS | Status: AC
  Administered 2017-01-14: 30 meq via INTRAVENOUS
  Filled 2017-01-14: qty 15

## 2017-01-14 MED ORDER — SODIUM CHLORIDE 0.9 % IV BOLUS (SEPSIS)
1000.0000 mL | Freq: Once | INTRAVENOUS | Status: AC
  Administered 2017-01-14: 1000 mL via INTRAVENOUS

## 2017-01-14 MED ORDER — COLLAGENASE 250 UNIT/GM EX OINT
TOPICAL_OINTMENT | Freq: Every day | CUTANEOUS | Status: DC
  Administered 2017-01-14 – 2017-01-15 (×2): via TOPICAL
  Filled 2017-01-14: qty 30

## 2017-01-14 MED ORDER — VALPROATE SODIUM 500 MG/5ML IV SOLN
125.0000 mg | Freq: Two times a day (BID) | INTRAVENOUS | Status: DC
  Administered 2017-01-14 – 2017-01-15 (×2): 125 mg via INTRAVENOUS
  Filled 2017-01-14 (×3): qty 1.25

## 2017-01-14 MED ORDER — GLUCOSE 40 % PO GEL: 1.0000 | Freq: Once | ORAL | Status: DC

## 2017-01-14 MED ORDER — DEXTROSE 50 % IV SOLN
50.0000 mL | Freq: Once | INTRAVENOUS | Status: AC
  Administered 2017-01-14: 50 mL via INTRAVENOUS
  Filled 2017-01-14: qty 50

## 2017-01-14 MED ORDER — SODIUM CHLORIDE 0.9 % IV BOLUS (SEPSIS): 1000.0000 mL | Freq: Once | INTRAVENOUS | Status: DC

## 2017-01-14 NOTE — Clinical Social Work Note (Signed)
CSW received consult that patient is from [facility] and is being followed by hospice of [place].  CSW was also informed that patient is not happy with SNF, CSW will meet with patient and family to discuss if they are interested in other facilities.  Jones Broom. [place], MSW, [place]  01/14/2017 11:19 AM

## 2017-01-14 NOTE — Progress Notes (Addendum)
Visit made. Patient is currently followed by [facility] [place] [facility] with a hospice diagnosis of Protein Calorie malnutrition. She is a DNR. Patient was sent to [facility] from [facility] for evaluation of a possible CVA versus seizure activity after being found by her son "foaming at the mouth",  of note patient's Keppra had been increased ealier in the week d/t suspected seizure activity. She was also receiving antibiotics for treatment of a UTI. Per report of patient's hospice RN, patient is seen at the wound clinic for her wounds. WOC RN present during visit. Patient seen lying in bed, eyes closed, did respond with eye opening to her name, no verbal response. Per discussion with staff /RN Amy and chart note review patient was more alert over night and ate some crackers and drank juice, given for low blood glucose. This morning she appears to be minimally responsive and has received D 50 for a low glucose and has not been able to take her oral medications. She is receiving IV antibiotics and Keppra IV. Neurology consult pending. Transfer summary and out of facility DNR in place on patient's hospital chart. Hospital staff aware of Hospice involvement. Thank you. Flo Shanks RN, BSN, [facility] [place], hospital liaison [phone] c

## 2017-01-14 NOTE — Progress Notes (Addendum)
Lactic acid 2.0. Patient heart rate sustaining 130's after PO Cardizem. MD Jannifer Franklin notified. MD to place orders.   Update: Patient heart rate still sustaining 130's sp bolus. Manual BP 94/60. MD Marcille Blanco notified. MD to place orders will continue to monitor.Marland Kitchen  Update: Patient heart rate low 100s. BP 98/64. Will continue to monitor.   Update: Per MD Marcille Blanco, we will hold AM Cardizem due to low BP.

## 2017-01-14 NOTE — Progress Notes (Signed)
SLP Cancellation Note  Patient Details Name: [name] MRN: [id number] DOB: 29-Jan-1941   Cancelled treatment:       Reason Eval/Treat Not Completed: Patient not medically ready;Fatigue/lethargy limiting ability to participate. Pt still not awake or alert despite max verbal and tactile attempts. Per NSG reports, Neurology consulted. Pt's diet status currently changed to NPO. Skilled ST services to f/u tomorrow with evaluation.    Eulogio Ditch, B.S Graduate Clinician  01/14/2017, 10:38 AM

## 2017-01-14 NOTE — Consult Note (Signed)
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  Wound type:pressure injury Pressure Injury POA: Yes Measurement:Sacrum and left ischium:  Stage 3 pressure injury 3 cm x 3 cm x 0.2 cm  Left heel: Unstageable  3.5 cm x 5 cm wound bed is 100% soft devitalized tissue, foul necrotic odor.  Right distal lower lateral leg  Stage 4  12cm x 4 cm x 0.5 cm with tendon visible  Slough and pink nongranulating tissue present.  Right proximal lower leg Stage 3- 3.5 cm x 2 cm x 0.2 cm pink and moist Wound bed: see above Drainage (amount, consistency, odor) Minimal serosanguinous  Periwound:intact Dressing procedure/placement/frequency:Cleanse sacral wound with soap and water and pat gently dry.  Apply silicone border foam dressing.  Change every 3 days and PRN soilage.  Cleanse left heel wound with NS and pat gently dry.  Apply Santyl to wound bed.  Cover with NS moist gauze.  Secure with 4x4 gauze, kerlix and tape.  Change daily.  Prevalon boots to offload pressure.  Cleanse wounds to left lower leg with NS and pat gently dry.  Place Mepitel contact layer over tendon to keep moist.   Moisten strip of Aquacel Ag with NS and place in wound bed.  Change Mon/Wed/Fri.  Will add mattress replacement with low air loss feature.  [place] team will not follow but will remain available to patient, medical and nursing teams as needed.  Domenic Moras RN BSN [place] Pager [phone]

## 2017-01-14 NOTE — Consult Note (Signed)
Reason for Consult:AMS Referring Physician: Gouru  CC: AMS  HPI: [name] is an 76 y.o. female with a history of afib on Eliquis who presents with altered mental status. Family is not available and patient is unable to provide history due to mental status.  All history obtained from the chart. Patient has a history of seizures since her CVA. She had one seizure in the past and is maintained on Keppra and Depakote. Per her sister the patient has been having small seizures the past week and a half. Her seizures are manifested by small withdrawing, contractions or staring. They had informed the nursing staff. Her urine has developed a foul oder and the nursing staff was informed. Her Keppra was increased prior to presentation. The patient has had some increased confusion. Yesterday she had seizure. Her son who visits daily came in and found her foaming at the mouth and poorly responsive. She was sent to the ER at that time. At baseline patient will speak some to staff but has very little verbal interaction with staff.    Past Medical History:  Diagnosis Date  . A-fib ([place]) 02/14/2015  . Arthritis   . Atrial fibrillation ([place])   . Colovaginal fistula   . Complication of anesthesia    DIFFICULTY WAKING   . CVA (cerebral infarction) 02/14/2015  . Dementia   . GERD without esophagitis 02/14/2015  . Glucose intolerance (impaired glucose tolerance)   . Gout   . Hyperlipidemia   . Recurrent UTI   . Seizure ([place]) 02/14/2015    Past Surgical History:  Procedure Laterality Date  . BUNIONECTOMY Right   . CHOLECYSTECTOMY    . Right total hip arthroplasty  1998  . Right total hip revision arthroplasty for a periprosthetic fracture  2007    Family History  Problem Relation Age of Onset  . Atrial fibrillation Sister     Social History:  reports that she has never smoked. She has never used smokeless tobacco. She reports that she does not drink alcohol or use drugs.  No Known  Allergies  Medications:  I have reviewed the patient's current medications. Prior to Admission:  Prescriptions Prior to Admission  Medication Sig Dispense Refill Last Dose  . acetaminophen (TYLENOL) 325 MG tablet Take 2 tablets (650 mg total) by mouth every 6 (six) hours as needed for mild pain (or Fever >/= 101). 30 tablet 0 prn at prn  . apixaban (ELIQUIS) 5 MG TABS tablet Take 1 tablet (5 mg total) by mouth 2 (two) times daily. 60 tablet 5 01/13/2017 at am  . ascorbic acid (VITAMIN C) 500 MG tablet Take 500 mg by mouth daily.   01/13/2017 at am  . collagenase (SANTYL) ointment Apply 1 application topically every other day. To left heel   unknown at Unknown time  . diltiazem (CARDIZEM) 30 MG tablet Take 1 tablet (30 mg total) by mouth every 6 (six) hours. 120 tablet 0 01/13/2017 at Unknown time  . divalproex (DEPAKOTE SPRINKLES) 125 MG capsule Take 125 mg by mouth at bedtime.   01/12/2017 at qhs  . esomeprazole (NEXIUM) 20 MG capsule Take 20 mg by mouth daily at 12 noon.   01/13/2017 at am  . furosemide (LASIX) 20 MG tablet Take 20 mg by mouth daily.     01/13/2017 at am  . guaifenesin (ROBITUSSIN) 100 MG/5ML syrup Take 100 mg by mouth 3 (three) times daily as needed for cough.   prn at prn  . ketotifen (ZADITOR) 0.025 %  ophthalmic solution Place 1 drop into both eyes daily as needed (for dry eyes).   prn at prn  . levETIRAcetam (KEPPRA) 100 MG/ML solution Take 500 mg by mouth 2 (two) times daily.   01/13/2017 at am  . mineral oil enema Place 1 enema rectally as needed for severe constipation.   prn at prn  . polyethylene glycol (MIRALAX) packet Take 17 g by mouth daily. 14 each 0 01/13/2017 at am  . potassium chloride (KLOR-CON) 10 MEQ CR tablet Take 10 mEq by mouth daily.     01/13/2017 at am  . QUEtiapine (SEROQUEL) 50 MG tablet Take 1 tablet (50 mg total) by mouth at bedtime. (Patient taking differently: Take 25 mg by mouth at bedtime. ) 30 tablet 2 01/12/2017 at qhs  . senna-docusate (SENOKOT S)  8.6-50 MG tablet Take 1 tablet by mouth daily. 30 tablet 1 01/13/2017 at am  . simvastatin (ZOCOR) 20 MG tablet Take 20 mg by mouth daily.   01/12/2017 at qhs  . sulfamethoxazole-trimethoprim (BACTRIM DS,SEPTRA DS) 800-160 MG tablet Take 1 tablet by mouth daily.   01/13/2017 at Unknown time  . traMADol (ULTRAM) 50 MG tablet Take 1 tablet (50 mg total) by mouth every 6 (six) hours as needed for moderate pain or severe pain. 30 tablet 0 prn at prn  . Amino Acids-Protein Hydrolys (FEEDING SUPPLEMENT, PRO-STAT 64,) LIQD Take 30 mLs by mouth daily.   Taking  . donepezil (ARICEPT) 5 MG tablet Take 1 tablet (5 mg total) by mouth at bedtime. (Patient not taking: Reported on 01/13/2017) 30 tablet 0 Not Taking at Unknown time  . feeding supplement, ENSURE ENLIVE, (ENSURE ENLIVE) LIQD Take 237 mLs by mouth 2 (two) times daily between meals. 237 mL 12   . levETIRAcetam (KEPPRA) 500 MG tablet Take 500 mg by mouth 2 (two) times daily.    Not Taking at Unknown time  . megestrol (MEGACE) 40 MG tablet Take 1 tablet (40 mg total) by mouth daily. (Patient not taking: Reported on 01/13/2017) 30 tablet 0 Not Taking at Unknown time  . memantine (NAMENDA) 10 MG tablet Take 10 mg by mouth 2 (two) times daily.   Not Taking at Unknown time   Scheduled: . apixaban  5 mg Oral BID  . cefTRIAXone  2 g Intravenous Q24H  . collagenase  1 application Topical QODAY  . diltiazem  30 mg Oral Q6H  . divalproex  125 mg Oral QHS  . feeding supplement (ENSURE ENLIVE)  237 mL Oral BID BM  . feeding supplement (PRO-STAT SUGAR FREE 64)  30 mL Oral Daily  . furosemide  20 mg Oral Daily  . levETIRAcetam  1,000 mg Intravenous Q12H  . memantine  10 mg Oral BID  . pantoprazole  40 mg Oral Daily  . polyethylene glycol  17 g Oral Daily  . QUEtiapine  25 mg Oral QHS  . senna-docusate  1 tablet Oral Daily  . simvastatin  20 mg Oral QHS  . sodium chloride flush  3 mL Intravenous Q12H    ROS: Patient unable to provide due to mental  status  Physical Examination: Blood pressure 97/71, pulse (!) 105, temperature 98 F (36.7 C), resp. rate 18, height 5' 0.5" (1.537 m), weight 102.1 kg (225 lb), SpO2 100 %.  HEENT-  Normocephalic, no lesions, without obvious abnormality.  Normal external eye and conjunctiva.  Normal TM's bilaterally.  Normal auditory canals and external ears. Normal external nose, mucus membranes and septum.  Normal pharynx. Cardiovascular- S1,  S2 normal, pulses palpable throughout   Lungs- chest clear, no wheezing, rales, normal symmetric air entry Abdomen- soft, non-tender; bowel sounds normal; no masses,  no organomegaly Extremities- no edema Lymph-no adenopathy palpable Musculoskeletal-left upper extremity contracture Skin-warm and dry, no hyperpigmentation, vitiligo, or suspicious lesions  Neurological Examination   Mental Status: Patient with eyes closed.  Opens eyes with deep sternal rub and attempts to localize with both upper extremities.  No speech.  Does not follow commands.   Cranial Nerves: II: Discs flat bilaterally; Blinks to bilateral confrontation, pupils equal, round, reactive to light and accommodation III,IV, VI: ptosis not present, oculocephalic maneuvers intact V,VII: left facial droop VIII: hearing unable to be tested IX,X: unable to test XI: unable to test XII: unable to test Motor: Increased tone throughout with some contracture noted of the LUE Sensory: Responds to noxious stimuli on the right but not on the left Deep Tendon Reflexes: 2+ in the upper extremities and absent in the lower extremities Plantars: Right: upgoing   Left: upgoing Cerebellar: Unable to test due to cooperation Gait: not tested due to safety concerns    Laboratory Studies:   Basic Metabolic Panel:  Recent Labs Lab 01/13/17 1445 01/14/17 0231  NA 139 140  K 3.3* 3.4*  CL 108 114*  CO2 24 20*  GLUCOSE 78 69  BUN 6 5*  CREATININE 0.50 0.47  CALCIUM 8.5* 7.8*    Liver Function  Tests:  Recent Labs Lab 01/13/17 1538  AST 14*  ALT 13*  ALKPHOS 75  BILITOT 1.1  PROT 6.3*  ALBUMIN 2.7*   No results for input(s): LIPASE, AMYLASE in the last 168 hours. No results for input(s): AMMONIA in the last 168 hours.  CBC:  Recent Labs Lab 01/13/17 1445 01/14/17 0231  WBC 5.7 5.8  HGB 11.4* 10.6*  HCT 35.2 33.0*  MCV 97.9 99.1  PLT 257 245    Cardiac Enzymes:  Recent Labs Lab 01/13/17 1538  TROPONINI 0.07*    BNP: Invalid input(s): POCBNP  CBG:  Recent Labs Lab 01/14/17 0302 01/14/17 0324 01/14/17 0326 01/14/17 0407 01/14/17 1004  GLUCAP 61* 66 69 145* 17    Microbiology: Results for orders placed or performed during the hospital encounter of 01/13/17  Culture, blood (Routine X 2) w Reflex to ID Panel     Status: None (Preliminary result)   Collection Time: 01/13/17  9:48 PM  Result Value Ref Range Status   Specimen Description BLOOD RIGHT HAND  Final   Special Requests BOTTLES DRAWN AEROBIC AND ANAEROBIC [place]  Final   Culture NO GROWTH < 12 HOURS  Final   Report Status PENDING  Incomplete  MRSA PCR Screening     Status: None   Collection Time: 01/13/17 10:51 PM  Result Value Ref Range Status   MRSA by PCR NEGATIVE NEGATIVE Final    Comment:        The GeneXpert MRSA Assay (FDA approved for NASAL specimens only), is one component of a comprehensive MRSA colonization surveillance program. It is not intended to diagnose MRSA infection nor to guide or monitor treatment for MRSA infections.   Culture, blood (Routine X 2) w Reflex to ID Panel     Status: None (Preliminary result)   Collection Time: 01/13/17 11:34 PM  Result Value Ref Range Status   Specimen Description BLOOD LEFT ANTECUBITAL  Final   Special Requests   Final    BOTTLES DRAWN AEROBIC AND ANAEROBIC ANA [place] AER 6CC   Culture NO GROWTH <  12 HOURS  Final   Report Status PENDING  Incomplete    Coagulation Studies: No results for input(s): LABPROT, INR in  the last 72 hours.  Urinalysis:  Recent Labs Lab 01/13/17 1538  COLORURINE AMBER*  LABSPEC 1.014  PHURINE 7.0  GLUCOSEU NEGATIVE  HGBUR SMALL*  BILIRUBINUR NEGATIVE  KETONESUR NEGATIVE  PROTEINUR 30*  NITRITE POSITIVE*  LEUKOCYTESUR LARGE*    Lipid Panel:     Component Value Date/Time   CHOL  05/11/2009 0353    108        ATP III CLASSIFICATION:  <200     mg/dL   Desirable  200-239  mg/dL   Borderline High  >=240    mg/dL   High          TRIG 57 05/11/2009 0353   HDL 43 05/11/2009 0353   CHOLHDL 2.5 05/11/2009 0353   VLDL 11 05/11/2009 0353   LDLCALC  05/11/2009 0353    54        Total Cholesterol/HDL:CHD Risk Coronary Heart Disease Risk Table                     Men   Women  1/2 Average Risk   3.4   3.3  Average Risk       5.0   4.4  2 X Average Risk   9.6   7.1  3 X Average Risk  23.4   11.0        Use the calculated Patient Ratio above and the CHD Risk Table to determine the patient's CHD Risk.        ATP III CLASSIFICATION (LDL):  <100     mg/dL   Optimal  100-129  mg/dL   Near or Above                    Optimal  130-159  mg/dL   Borderline  160-189  mg/dL   High  >190     mg/dL   Very High    HgbA1C: No results found for: HGBA1C  Urine Drug Screen:     Component Value Date/Time   LABOPIA POSITIVE (A) 03/30/2015 1659   COCAINSCRNUR NONE DETECTED 03/30/2015 1659   LABBENZ NONE DETECTED 03/30/2015 1659   AMPHETMU NONE DETECTED 03/30/2015 1659   THCU NONE DETECTED 03/30/2015 1659   LABBARB NONE DETECTED 03/30/2015 1659    Alcohol Level: No results for input(s): ETH in the last 168 hours.  Other results: EKG: atrial fibrillation, rate 129 bpm.  Imaging: Ct Head Wo Contrast  Result Date: 01/13/2017 CLINICAL DATA:  Seizure today. History of prior seizures and stroke. EXAM: CT HEAD WITHOUT CONTRAST TECHNIQUE: Contiguous axial images were obtained from the base of the skull through the vertex without intravenous contrast. COMPARISON:  02/14/2015.  FINDINGS: Brain: No evidence of acute infarction, hemorrhage, hydrocephalus, extra-axial collection or mass lesion/mass effect. There is encephalomalacia involving the lateral right frontal lobe reflecting an old infarct. There is mild ex vacuo dilation of the right lateral ventricle associated with this. Vascular: No hyperdense vessel or unexpected calcification. Skull: Normal. Negative for fracture or focal lesion. Sinuses/Orbits: Globes and orbits are unremarkable. There is mild-to-moderate left maxillary sinus mucosal thickening with a small amount of dependent fluid. Remaining visualized sinuses are clear. Clear mastoid air cells. Other: None. IMPRESSION: 1. No acute intracranial abnormalities. 2. Old right MCA distribution infarct. Electronically Signed   By: Lajean Manes M.D.   On: 01/13/2017 15:46   Dg  Chest Port 1 View  Result Date: 01/13/2017 CLINICAL DATA:  Cough. EXAM: PORTABLE CHEST 1 VIEW COMPARISON:  01/13/2017 earlier as well as 10/31/2016 FINDINGS: Lungs are somewhat hypoinflated with stable mild elevation of the left hemidiaphragm. No focal lobar consolidation or effusion. Moderate cardiomegaly unchanged. Remainder of the exam is unchanged. IMPRESSION: Mild hypoinflation without acute cardiopulmonary disease. Stable cardiomegaly. Electronically Signed   By: Marin Olp M.D.   On: 01/13/2017 23:25   Dg Chest Port 1 View  Result Date: 01/13/2017 CLINICAL DATA:  Pt comes into the ED via EMS from [facility] where they called out for an unwitnessed seizure. Per facility she has had a recent change in her Keppra. Hx - stroke with generalized weakness, a-fib, and dementia. Non-smoker EXAM: PORTABLE CHEST 1 VIEW COMPARISON:  10/31/2016 FINDINGS: Lung volumes are low. There is mild atelectasis above the left hemidiaphragm. Lungs are otherwise clear. Cardiac silhouette is mildly enlarged. No mediastinal or hilar masses. No convincing pleural effusion.  No pneumothorax. Skeletal structures are  demineralized but grossly intact. IMPRESSION: No acute cardiopulmonary disease.  No change from the prior study. Electronically Signed   By: Lajean Manes M.D.   On: 01/13/2017 15:37     Assessment/Plan: 76 year old female with a history of atrial fibrillation presenting with UTI and AMS.  Noted to have an increase in seizures at her facility.  Keppra has been increased.  Depakote continues with a level of 13.  Unclear if altered mental status is due to subclinical seizure activity versus metabolic encephalopathy.  UTI being treated with antibiotics.  Head CT reviewed and shows no evidence of acute changes.    Recommendations: 1.  EEG 2.  Agree with treatment of infection 3.  Continue Keppra and Depakote at current dose unless evidence of continued seizure activity seen on EEG.  Will restart Depakote IV at 125mg  q 12 hours.   4.  Serum Ammonia  Alexis Goodell, MD Neurology [phone] 01/14/2017, 11:13 AM

## 2017-01-14 NOTE — Progress Notes (Signed)
SLP Cancellation Note  Patient Details Name: [name] MRN: [id number] DOB: 05-17-1941   Cancelled treatment:       Reason Eval/Treat Not Completed: Fatigue/lethargy limiting ability to participate (Consulted NSG, chart reviewed. ) NSG and SLP attempted to wake pt resting in bed, pt's eyes would flicker open and immediately close again. Consulted NSG who is going to hold meds at this time. Will re-attempt to see pt before lunchtime meal tray.    Eulogio Ditch, B.S Graduate Clinician  01/14/2017, 9:01 AM

## 2017-01-14 NOTE — Care Management (Signed)
[facility] is followed by [name] for protein malnutrition.  She is long term resident at [facility]

## 2017-01-14 NOTE — Progress Notes (Addendum)
Patient AM lab glucose level 69. Fingerstick blood glucose 61. Attempted to give apple juice, patient drank half of juice, then stated "i aint drinking no more juice". Waited 15 minutes, finger stick 69. Notified MD Marcille Blanco. Per MD give amp of D50. Order placed, will administer.  Update: Follow up blood glucose 145. Will continue to monitor.

## 2017-01-14 NOTE — Progress Notes (Addendum)
[place] [facility] [place] NAME: [name]    MR#:  [id number]  DATE OF BIRTH:  29-Mar-1941  SUBJECTIVE:  CHIEF COMPLAINT:  Pt is altered  REVIEW OF SYSTEMS:  Review of systems unobtainable  DRUG ALLERGIES:  No Known Allergies  VITALS:  Blood pressure (!) 89/60, pulse 90, temperature 98.3 F (36.8 C), temperature source Axillary, resp. rate 16, height 5' 0.5" (1.537 m), weight 102.1 kg (225 lb), SpO2 98 %.  PHYSICAL EXAMINATION:  GENERAL:  76 y.o.-year-old patient lying in the bed with no acute distress.  EYES: Pupils equal, round, reactive to light and accommodation. No scleral icterus. Extraocular muscles intact.  HEENT: Head atraumatic, normocephalic. Oropharynx and nasopharynx clear.  NECK:  Supple, no jugular venous distention. No thyroid enlargement, no tenderness.  LUNGS: Normal breath sounds bilaterally, no wheezing, rales,rhonchi or crepitation. No use of accessory muscles of respiration.  CARDIOVASCULAR: S1, S2 normal. No murmurs, rubs, or gallops.  ABDOMEN: Soft, nontender, nondistended. Bowel sounds present. No organomegaly or mass.  EXTREMITIES: No pedal edema, cyanosis, or clubbing.  NEUROLOGIC: Cranial nerves II through XII are intact. Muscle strength 5/5 in all extremities. Sensation intact. Gait not checked.  PSYCHIATRIC: The patient is alert and oriented x 3.  SKIN: No obvious rash, lesion, or ulcer.    LABORATORY PANEL:   CBC  Recent Labs Lab 01/14/17 0231  WBC 5.8  HGB 10.6*  HCT 33.0*  PLT 245   ------------------------------------------------------------------------------------------------------------------  Chemistries   Recent Labs Lab 01/13/17 1538 01/14/17 0231  NA  --  140  K  --  3.4*  CL  --  114*  CO2  --  20*  GLUCOSE  --  69  BUN  --  5*  CREATININE  --  0.47  CALCIUM  --  7.8*  AST 14*  --   ALT 13*  --   ALKPHOS 75  --   BILITOT 1.1  --     ------------------------------------------------------------------------------------------------------------------  Cardiac Enzymes  Recent Labs Lab 01/13/17 1538  TROPONINI 0.07*   ------------------------------------------------------------------------------------------------------------------  RADIOLOGY:  Ct Head Wo Contrast  Result Date: 01/13/2017 CLINICAL DATA:  Seizure today. History of prior seizures and stroke. EXAM: CT HEAD WITHOUT CONTRAST TECHNIQUE: Contiguous axial images were obtained from the base of the skull through the vertex without intravenous contrast. COMPARISON:  02/14/2015. FINDINGS: Brain: No evidence of acute infarction, hemorrhage, hydrocephalus, extra-axial collection or mass lesion/mass effect. There is encephalomalacia involving the lateral right frontal lobe reflecting an old infarct. There is mild ex vacuo dilation of the right lateral ventricle associated with this. Vascular: No hyperdense vessel or unexpected calcification. Skull: Normal. Negative for fracture or focal lesion. Sinuses/Orbits: Globes and orbits are unremarkable. There is mild-to-moderate left maxillary sinus mucosal thickening with a small amount of dependent fluid. Remaining visualized sinuses are clear. Clear mastoid air cells. Other: None. IMPRESSION: 1. No acute intracranial abnormalities. 2. Old right MCA distribution infarct. Electronically Signed   By: Lajean Manes M.D.   On: 01/13/2017 15:46   Dg Chest Port 1 View  Result Date: 01/13/2017 CLINICAL DATA:  Cough. EXAM: PORTABLE CHEST 1 VIEW COMPARISON:  01/13/2017 earlier as well as 10/31/2016 FINDINGS: Lungs are somewhat hypoinflated with stable mild elevation of the left hemidiaphragm. No focal lobar consolidation or effusion. Moderate cardiomegaly unchanged. Remainder of the exam is unchanged. IMPRESSION: Mild hypoinflation without acute cardiopulmonary disease. Stable cardiomegaly. Electronically Signed   By: Marin Olp M.D.   On:  01/13/2017 23:25  Dg Chest Port 1 View  Result Date: 01/13/2017 CLINICAL DATA:  Pt comes into the ED via EMS from [facility] where they called out for an unwitnessed seizure. Per facility she has had a recent change in her Keppra. Hx - stroke with generalized weakness, a-fib, and dementia. Non-smoker EXAM: PORTABLE CHEST 1 VIEW COMPARISON:  10/31/2016 FINDINGS: Lung volumes are low. There is mild atelectasis above the left hemidiaphragm. Lungs are otherwise clear. Cardiac silhouette is mildly enlarged. No mediastinal or hilar masses. No convincing pleural effusion.  No pneumothorax. Skeletal structures are demineralized but grossly intact. IMPRESSION: No acute cardiopulmonary disease.  No change from the prior study. Electronically Signed   By: Lajean Manes M.D.   On: 01/13/2017 15:37    EKG:   Orders placed or performed during the hospital encounter of 01/13/17  . ED EKG  . ED EKG    ASSESSMENT AND PLAN:    #Encephalopathy metabolic/post ictal Nothing by mouth Continue IV antibiotics and follow up on the cultures Follow-up with neurology  #Urinary tract infection -Continue close monitoring. Patient is currently altered -Urine cultures and blood cultures f/u -IV Cipro  -Lactic acid at 1.8  #Chronic history of seizures from old stroke with probable breakthrough seizures -Likely due to above. Infection decreased seizures threshold -Continue IV medications -EEG -Continue Keppra and Depakote -Check ammonia level  #Hypokalemia potassium at 3.4 replete and recheck in a.m.  #Possible aspiration pneumonia as patient is lethargic and altered  Initial chest x-rays negative but patient with rhonchi and audible congestion Will repeat chest x-ray in a.m. -Duonebs ordered. Patient on IV Cipro for UTI -Nothing by mouth, swallow evaluation with patient more aware. Patient's decreased mentation most likely postictal   #chronic Sacral decubitus stage 2 present at the time of  admission  Left heel eschar present at the time of admission  Large right calf decub at ankle, smaller ulcer noted above presented at time of admission -wound care consult, continue santyl -Frequent repositioning of the patient -Patient's orthopedics last week. Family informed that patient currently not a surgical candidate due to poor nutritional status and health. Improve nutritional encouraged. Wound care consulted   chronic atrial fibrillation-chronic anticoggulation -Continue Cardizem  Elevated troponin -Chronic. 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 Management plans discussed with the patient's son Sellars over phone. He is agreeable  CODE STATUS: dnr   TOTAL TIME TAKING CARE OF THIS PATIENT: 35 minutes.   POSSIBLE D/C IN 2  DAYS, DEPENDING ON CLINICAL CONDITION.  Note: This dictation was prepared with Dragon dictation along with smaller phrase technology. Any transcriptional errors that result from this process are unintentional.   Nicholes Mango M.D on 01/14/2017 at 11:53 AM  Between 7am to 6pm - Pager - [phone] After 6pm go to [email or web address] - password [email or web address] [facility]  Office  [phone]  CC: Primary care physician; Kirk Ruths., MD

## 2017-01-14 NOTE — Progress Notes (Signed)
Initial Nutrition Assessment  DOCUMENTATION CODES:   Obesity unspecified  INTERVENTION:  1. Ensure Enlive po BID, each supplement provides 350 kcal and 20 grams of protein 2. Pro-Stat 48mL BID, each supplement provides 100kcal and 15 grams of protein  NUTRITION DIAGNOSIS:   Inadequate oral intake related to lethargy/confusion, inability to eat as evidenced by NPO status.  GOAL:   Patient will meet greater than or equal to 90% of their needs  MONITOR:   PO intake, Supplement acceptance, I & O's, Weight trends, Labs  REASON FOR ASSESSMENT:   Other (Comment) (Low BMI)    ASSESSMENT:   This is a 76 year old female resident of [facility]. 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 She's had one seizure in the past, she is maintained on Keppra. Per her sister the patient has been having small seizures the past week and a half.  Ms. Garnet was lethargic, unresponsive during visit. No family at bedside to provide history, derived from chart. Has multiple pressure ulcers, indicating negative protein status. Interventions should help once patient's diet is advanced. Weight down 25#/10% over 6 months. No documented PO intake thus far Labs and medications reviewed: K 3.4 Miralax, Senokot-S NS w/ KCL 59mEq @ 32mL/hr    Diet Order:  Diet NPO time specified  Skin:  Wound (see comment) (Stg II to sacrum, L Heel, Large R calf Decubitus)  Last BM:  01/13/2017  Height:   Ht Readings from Last 1 Encounters:  01/13/17 5' 0.5" (1.537 m)    Weight:   Wt Readings from Last 1 Encounters:  01/13/17 225 lb (102.1 kg)    Ideal Body Weight:  46.59 kg  BMI:  Body mass index is 43.22 kg/m.  Estimated Nutritional Needs:   Kcal:  1511-1800 calories  Protein:  102-122 gm  Fluid:  >/= 1.5L  EDUCATION NEEDS:   No education needs identified at this time  [name]. Jameela Michna, MS, RD LDN Inpatient Clinical Dietitian Pager [phone]

## 2017-01-15 ENCOUNTER — Inpatient Hospital Stay

## 2017-01-15 LAB — CBC WITH DIFFERENTIAL/PLATELET
Basophils Absolute: 0 10*3/uL (ref 0–0.1)
Basophils Relative: 1 %
Eosinophils Absolute: 0.1 10*3/uL (ref 0–0.7)
Eosinophils Relative: 1 %
HEMATOCRIT: 35.3 % (ref 35.0–47.0)
HEMOGLOBIN: 11.6 g/dL — AB (ref 12.0–16.0)
LYMPHS ABS: 2 10*3/uL (ref 1.0–3.6)
LYMPHS PCT: 33 %
MCH: 32.2 pg (ref 26.0–34.0)
MCHC: 32.8 g/dL (ref 32.0–36.0)
MCV: 98.2 fL (ref 80.0–100.0)
Monocytes Absolute: 0.6 10*3/uL (ref 0.2–0.9)
Monocytes Relative: 11 %
NEUTROS PCT: 54 %
Neutro Abs: 3.3 10*3/uL (ref 1.4–6.5)
Platelets: 242 10*3/uL (ref 150–440)
RBC: 3.59 MIL/uL — AB (ref 3.80–5.20)
RDW: 15.7 % — ABNORMAL HIGH (ref 11.5–14.5)
WBC: 6.1 10*3/uL (ref 3.6–11.0)

## 2017-01-15 LAB — BASIC METABOLIC PANEL
Anion gap: 7 (ref 5–15)
BUN: <5 mg/dL — ABNORMAL LOW (ref 6–20)
CHLORIDE: 114 mmol/L — AB (ref 101–111)
CO2: 18 mmol/L — ABNORMAL LOW (ref 22–32)
Calcium: 8.2 mg/dL — ABNORMAL LOW (ref 8.9–10.3)
Creatinine, Ser: 0.52 mg/dL (ref 0.44–1.00)
GFR calc non Af Amer: >60 mL/min (ref >60)
Glucose, Bld: 75 mg/dL (ref 65–99)
Potassium: 4.6 mmol/L (ref 3.5–5.1)
SODIUM: 139 mmol/L (ref 135–145)

## 2017-01-15 LAB — URINE CULTURE

## 2017-01-15 MED ORDER — LEVETIRACETAM 750 MG PO TABS
750.0000 mg | ORAL_TABLET | Freq: Two times a day (BID) | ORAL | Status: DC
  Administered 2017-01-15 – 2017-01-16 (×3): 750 mg via ORAL
  Filled 2017-01-15 (×4): qty 1

## 2017-01-15 MED ORDER — AMOXICILLIN-POT CLAVULANATE 875-125 MG PO TABS
1.0000 | ORAL_TABLET | Freq: Two times a day (BID) | ORAL | Status: DC
  Administered 2017-01-15 – 2017-01-16 (×2): 1 via ORAL
  Filled 2017-01-15 (×2): qty 1

## 2017-01-15 MED ORDER — DEXTROSE 5 % IV SOLN
2.0000 g | Freq: Every day | INTRAVENOUS | Status: DC
  Administered 2017-01-15: 2 g via INTRAVENOUS
  Filled 2017-01-15: qty 2

## 2017-01-15 NOTE — Clinical Social Work Note (Signed)
Clinical Social Work Assessment  Patient Details  Name: [name] MRN: [id number] Date of Birth: 04-05-1941  Date of referral:  01/15/17               Reason for consult:  Facility Placement                Permission sought to share information with:  Family Supports Permission granted to share information::  Yes, Verbal Permission Granted  Name::     [name] Son   [phone] or Fonnie Jarvis [id number]  [phone]   Agency::  SNF admissions  Relationship::     Contact Information:     Housing/Transportation Living arrangements for the past 2 months:  [place] of Information:  Patient, Adult Children Patient Interpreter Needed:  None Criminal Activity/Legal Involvement Pertinent to Current Situation/Hospitalization:  No - Comment as needed Significant Relationships:  Adult Children Lives with:  Facility Resident Do you feel safe going back to the place where you live?  No Need for family participation in patient care:  Yes (Comment)  Care giving concerns:  Patient's family would like her to move to a different long term care SNF, but if there are not any other options, they will agree to have her return back to [facility].   Social Worker assessment / plan:  Patient is a 76 year old female who is a long term care resident at [facility].  Patient has some early dementia, and assessment was completed by speaking to son who was at bedside.  Patient has been at [facility] for about 18 months, after she had a stroke.  Patient's son states they would like her to move to a different facility if possible.  CSW explained that not many SNFs offer long term care Medicaid beds, so their options will be limited on where she can go.  Patient's son expressed understanding.  CSW explained to patient's son what the process is for looking for a different SNF.  Patient's son gave CSW permission to begin bed search in [place].    Employment status:  Retired Advertising copywriter, Medicaid In [place] PT Recommendations:  Not assessed at this time [place] / Referral to community resources:  [place]  Patient/Family's Response to care:  Patient and son are in agreement to returning back to [facility] or a different SNF if the option is available.  Patient/Family's Understanding of and Emotional Response to Diagnosis, Current Treatment, and Prognosis:  Patient has some early dementia, and is not very aware of current treatment plan.  Emotional Assessment Appearance:  Appears stated age Attitude/Demeanor/Rapport:    Affect (typically observed):  Appropriate, Calm Orientation:  Oriented to Place, Oriented to Self Alcohol / Substance use:  Not Applicable Psych involvement (Current and /or in the community):  No (Comment)  Discharge Needs  Concerns to be addressed:  Other (Comment Required (Patient's family is interested in moving to a different facility.) Readmission within the last 30 days:  No Current discharge risk:  None Barriers to Discharge:  Continued Medical Work up   Ross Ludwig, [place] 01/15/2017, 2:10 PM

## 2017-01-15 NOTE — Progress Notes (Signed)
Visit made. Patient seen lying in bed, eyes closed. Son [name] at bedside. Patient did respond to her name and was more interactive than at visit yesterday. Todd pleased with his mother's improved condition. She ate some bites of breakfast and took her oral medications this morning. She continues on IV antibiotics for treatment of a urinary tract infection. No nonverbal signs of discomfort noted during the visit. Per chart note review she did have a dose of PRN tramadol at 10:00 am today.  [name] has expressed again his desire to have his mother discharge to another skilled nursing facility. [name] CSW is aware. Possible discharge today. Will continue to follow and update Hospice team. Thank you.  Michelle Shanks RN, BSN, [place] [facility] [place], [facility] [phone] c

## 2017-01-15 NOTE — NC FL2 (Signed)
[place] LEVEL OF CARE SCREENING TOOL     IDENTIFICATION  Patient Name: [name] Birthdate: 06-24-1941 Sex: female Admission Date (Current Location): 01/13/2017  [place] and [place] Number:  Selena Lesser [id number] [place] and Address:  [facility], [address], [place], [place] [address]      Provider Number: [id number]  Attending Physician Name and Address:  Max Sane, MD  Relative Name and Phone Number:  Rico Ala   [phone] or Fonnie Jarvis [id number]  [phone]     Current Level of Care: Hospital Recommended Level of Care: [place] Prior Approval Number:    Date Approved/Denied:   PASRR Number: [id number]  Discharge Plan: SNF    Current Diagnoses: Patient Active Problem List   Diagnosis Date Noted  . Palliative care encounter   . Goals of care, counseling/discussion   . DNR (do not resuscitate) discussion   . Sepsis ([place]) 10/31/2016  . Pressure injury of skin 10/31/2016  . Allergic state 10/05/2016  . Arthritis 10/05/2016  . Glucose intolerance (impaired glucose tolerance) 10/05/2016  . History of gout 10/05/2016  . Hyperlipidemia, unspecified 10/05/2016  . Stroke ([place]) 10/05/2016  . Delirium due to another medical condition 07/09/2016  . 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 Fracture, tibial plateau 04/01/2015  . Fall   . Knee fracture, left 03/29/2015  . UTI (urinary tract infection) 03/29/2015  . Mental status, decreased 02/14/2015  . A-fib ([place]) 02/14/2015  . Cerebral infarction ([place]) 02/14/2015  . GERD (gastroesophageal reflux disease) 02/14/2015  . Seizure ([place]) 02/14/2015  . Dementia 02/14/2015  . GERD without esophagitis 02/14/2015  . History of CVA (cerebrovascular accident)   . Long term current use of anticoagulant therapy 08/08/2011    Orientation RESPIRATION BLADDER Height & Weight     Self  Normal Incontinent Weight: 225 lb (102.1 kg) Height:  5' 0.5"  (153.7 cm)  BEHAVIORAL SYMPTOMS/MOOD NEUROLOGICAL BOWEL NUTRITION STATUS      Incontinent Diet Dysphagia 1 diet (Puree, Thin Liquids)  AMBULATORY STATUS COMMUNICATION OF NEEDS Skin   Total Care Verbally PU Stage and Appropriate Care, Surgical wounds   PU Stage 2 Dressing: Daily                   Personal Care Assistance Level of Assistance  Bathing, Feeding, Dressing Bathing Assistance: Maximum assistance Feeding assistance: Maximum assistance Dressing Assistance: Maximum assistance     Functional Limitations Info  Sight, Hearing, Speech Sight Info: Adequate Hearing Info: Adequate Speech Info: Adequate    SPECIAL CARE FACTORS FREQUENCY                       Contractures Contractures Info: Not present    Additional Factors Info  Code Status, Allergies, Psychotropic Code Status Info: DNR Allergies Info: NKA Psychotropic Info: QUEtiapine (SEROQUEL) tablet 25 mg         Current Medications (01/15/2017):  This is the current hospital active medication list Current Facility-Administered Medications  Medication Dose Route Frequency Provider Last Rate Last Dose  . 0.9 % NaCl with KCl 20 mEq/ L  infusion   Intravenous Continuous Debby Crosley, MD 50 mL/hr at 01/14/17 2323    . acetaminophen (TYLENOL) tablet 650 mg  650 mg Oral Q6H PRN Debby Crosley, MD       Or  . acetaminophen (TYLENOL) suppository 650 mg  650 mg Rectal Q6H PRN Debby Crosley, MD      . apixaban (ELIQUIS) tablet 5 mg  5  mg Oral BID Quintella Baton, MD   5 mg at 01/15/17 0958  . cefTRIAXone (ROCEPHIN) 2 g in dextrose 5 % 50 mL IVPB  2 g Intravenous Daily Nicholes Mango, MD   2 g at 01/15/17 1044  . collagenase (SANTYL) ointment   Topical Daily Nicholes Mango, MD      . diltiazem (CARDIZEM) tablet 30 mg  30 mg Oral Q6H Quintella Baton, MD   Stopped at 01/14/17 0528  . divalproex (DEPAKOTE SPRINKLE) capsule 125 mg  125 mg Oral QHS Debby Crosley, MD   125 mg at 01/13/17 2234  . feeding supplement (ENSURE ENLIVE)  (ENSURE ENLIVE) liquid 237 mL  237 mL Oral BID BM Debby Crosley, MD   237 mL at 01/15/17 0955  . feeding supplement (PRO-STAT SUGAR FREE 64) liquid 30 mL  30 mL Oral Daily Debby Crosley, MD   30 mL at 01/15/17 1007  . furosemide (LASIX) tablet 20 mg  20 mg Oral Daily Debby Crosley, MD   20 mg at 01/15/17 0958  . guaifenesin (ROBITUSSIN) 100 MG/5ML syrup 100 mg  100 mg Oral TID PRN Debby Crosley, MD      . ketotifen (ZADITOR) 0.025 % ophthalmic solution 1 drop  1 drop Both Eyes Daily PRN Debby Crosley, MD      . levETIRAcetam (KEPPRA) tablet 750 mg  750 mg Oral BID Nicholes Mango, MD      . memantine (NAMENDA) tablet 10 mg  10 mg Oral BID Quintella Baton, MD   10 mg at 01/15/17 0958  . ondansetron (ZOFRAN) tablet 4 mg  4 mg Oral Q6H PRN Debby Crosley, MD       Or  . ondansetron (ZOFRAN) injection 4 mg  4 mg Intravenous Q6H PRN Debby Crosley, MD      . pantoprazole (PROTONIX) EC tablet 40 mg  40 mg Oral Daily Debby Crosley, MD   40 mg at 01/15/17 0958  . polyethylene glycol (MIRALAX / GLYCOLAX) packet 17 g  17 g Oral Daily Debby Crosley, MD   17 g at 01/15/17 0959  . QUEtiapine (SEROQUEL) tablet 25 mg  25 mg Oral QHS Debby Crosley, MD   25 mg at 01/13/17 2234  . senna-docusate (Senokot-S) tablet 1 tablet  1 tablet Oral Daily Quintella Baton, MD   1 tablet at 01/15/17 0957  . simvastatin (ZOCOR) tablet 20 mg  20 mg Oral QHS Debby Crosley, MD   20 mg at 01/13/17 2234  . sodium chloride flush (NS) 0.9 % injection 3 mL  3 mL Intravenous Q12H Debby Crosley, MD   3 mL at 01/15/17 0959  . traMADol (ULTRAM) tablet 50 mg  50 mg Oral Q6H PRN Debby Crosley, MD   50 mg at 01/15/17 1000     Discharge Medications: Please see discharge summary for a list of discharge medications.  Relevant Imaging Results:  Relevant Lab Results:   Additional Information SSN [id number]  Ross Ludwig, [place]

## 2017-01-15 NOTE — Plan of Care (Signed)
Problem: SLP Dysphagia Goals Goal: Misc Dysphagia Goal Pt will safely tolerate po diet of least restrictive consistency w/ no overt s/s of aspiration noted by Staff/pt/family x3 sessions.    

## 2017-01-15 NOTE — Progress Notes (Signed)
[place] [facility] [place] NAME: [name]    MR#:  [id number]  DATE OF BIRTH:  [birth year]  SUBJECTIVE:  CHIEF COMPLAINT:  Patient is more alert today reporting pain, son at bed side, tolerating diet  REVIEW OF SYSTEMS:  CONSTITUTIONAL: No fever, fatigue or weakness.  EYES: No blurred or double vision.  EARS, NOSE, AND THROAT: No tinnitus or ear pain.  RESPIRATORY: No cough, shortness of breath, wheezing or hemoptysis.  CARDIOVASCULAR: No chest pain, orthopnea, edema.  GASTROINTESTINAL: No nausea, vomiting, diarrhea or abdominal pain.  GENITOURINARY: No dysuria, hematuria.  ENDOCRINE: No polyuria, nocturia,    DRUG ALLERGIES:  No Known Allergies  VITALS:  Blood pressure 119/89, pulse (!) 108, temperature 98.4 F (36.9 C), temperature source Oral, resp. rate 18, height 5' 0.5" (1.537 m), weight 102.1 kg (225 lb), SpO2 100 %.  PHYSICAL EXAMINATION:  GENERAL:  76 y.o.-year-old patient lying in the bed with no acute distress.  EYES: Pupils equal, round, reactive to light and accommodation. No scleral icterus. Extraocular muscles intact.  HEENT: Head atraumatic, normocephalic. Oropharynx and nasopharynx clear.  NECK:  Supple, no jugular venous distention. No thyroid enlargement, no tenderness.  LUNGS: Normal breath sounds bilaterally, no wheezing, rales,rhonchi or crepitation. No use of accessory muscles of respiration.  CARDIOVASCULAR: S1, S2 normal. No murmurs, rubs, or gallops.  ABDOMEN: Soft, nontender, nondistended. Bowel sounds present. No organomegaly or mass.  EXTREMITIES: No pedal edema, cyanosis NEUROLOGIC:. Chronic contractures from previous stroke Sensation intact. Gait not checked.  PSYCHIATRIC: The patient is alert and oriented x 3.  SKIN: No obvious rash, lesion, or ulcer.    LABORATORY PANEL:   CBC  Recent Labs Lab 01/15/17 0504  WBC 6.1  HGB 11.6*  HCT 35.3  PLT 242    ------------------------------------------------------------------------------------------------------------------  Chemistries   Recent Labs Lab 01/13/17 1538  01/15/17 0504  NA  --   < > 139  K  --   < > 4.6  CL  --   < > 114*  CO2  --   < > 18*  GLUCOSE  --   < > 75  BUN  --   < > <5*  CREATININE  --   < > 0.52  CALCIUM  --   < > 8.2*  AST 14*  --   --   ALT 13*  --   --   ALKPHOS 75  --   --   BILITOT 1.1  --   --   < > = values in this interval not displayed. ------------------------------------------------------------------------------------------------------------------  Cardiac Enzymes  Recent Labs Lab 01/13/17 1538  TROPONINI 0.07*   ------------------------------------------------------------------------------------------------------------------  RADIOLOGY:  Dg Chest Port 1 View  Result Date: 01/15/2017 CLINICAL DATA:  Shortness of breath. EXAM: PORTABLE CHEST 1 VIEW COMPARISON:  01/13/2017. FINDINGS: Stable cardiomegaly. Mild left base atelectasis and infiltrate with small left pleural effusion. No pneumothorax. Stable elevation left hemidiaphragm. IMPRESSION: 1. Mild left base atelectasis and infiltrate with small left pleural effusion. Stable elevation left hemidiaphragm. 2. Stable cardiomegaly.  No pulmonary venous congestion . Electronically Signed   By: Marcello Moores  Register   On: 01/15/2017 07:03   Dg Chest Port 1 View  Result Date: 01/13/2017 CLINICAL DATA:  Cough. EXAM: PORTABLE CHEST 1 VIEW COMPARISON:  01/13/2017 earlier as well as 10/31/2016 FINDINGS: Lungs are somewhat hypoinflated with stable mild elevation of the left hemidiaphragm. No focal lobar consolidation or effusion. Moderate cardiomegaly unchanged. Remainder of the exam is unchanged. IMPRESSION:  Mild hypoinflation without acute cardiopulmonary disease. Stable cardiomegaly. Electronically Signed   By: Marin Olp M.D.   On: 01/13/2017 23:25    EKG:   Orders placed or performed during the  hospital encounter of 01/13/17  . ED EKG  . ED EKG    ASSESSMENT AND PLAN:    #Encephalopathy metabolic/post ictal Significantly improved. Patient is at her baseline according to the son  Follow-up with neurology as an outpatient   #Urinary tract infection -Urine culture contaminated  Discontinue ciprofloxacin  #Chronic history of seizures from old stroke with probable breakthrough seizures -EEG negative as per my discussion with neurology  -Likely due to above. Infection decreased seizures threshold -Change IV to by mouth Keppra 750 mg twice a day -Continue Depakote at bedtime  #Hypokalemia potassium  Repleted. Potassium at 4.6  #Possible aspiration pneumonia  Initial chest x-rays negative but patient with rhonchi and audible congestion repeat chest x-ray with possible atelectasis/small infiltrate Patient was seen and evaluated by speech therapy who is recommending dysphagia type I diet. Patient is tolerating fine -Duonebs ordered. Patient on ciprofloxacin will change it to by mouth Augmentin    #chronic Sacral decubitus stage 2 present at the time of admission  Left heel eschar present at the time of admission  Large right calf decub at ankle, smaller ulcer noted above presented at time of admission -wound care consult, continue santyl -Frequent repositioning of the patient -Patient seen by orthopedics last week. Family informed that patient currently not a surgical candidate due to poor nutritional status and health. Improve nutritional encouraged. Wound care consulted   chronic atrial fibrillation-chronic anticoggulation -Continue Cardizem  Elevated troponin -Chronic. 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 Management plans  discussed with the patient's son Sellars at bedside requesting different facility because of his previous bad experiences. Social worker is following   CODE STATUS: dnr   TOTAL TIME TAKING CARE OF THIS PATIENT: 32 minutes.   POSSIBLE D/C IN  a.m.  DAYS, DEPENDING ON CLINICAL CONDITION.  Note: This dictation was prepared with Dragon dictation along with smaller phrase technology. Any transcriptional errors that result from this process are unintentional.   Nicholes Mango M.D on 01/15/2017 at 4:25 PM  Between 7am to 6pm - Pager - [phone] After 6pm go to [email or web address] - password [email or web address] [facility]  Office  [phone]  CC: Primary care physician; Kirk Ruths., MD

## 2017-01-15 NOTE — Clinical Social Work Note (Addendum)
CSW spoke to patient's son who was interested in finding out if another SNF can accept patient.  CSW sent patient's information to different SNFs, and patient's son chose to return back to [facility] with Hospice to continue to follow patient's progress.  CSW updated SNF that patient plans to return back to SNF once she is medically ready for discharge and orders have been received.  [facility] can accept patient back on Wednesday.  Jones Broom. [place], MSW, [place]  01/15/2017 4:12 PM

## 2017-01-15 NOTE — Care Management Important Message (Signed)
Important Message  Patient Details  Name: DIAMONDNIQUE TIPPEN MRN: [id number] Date of Birth: [birth year]   Medicare Important Message Given:  Yes  Initial signed IM printed from Epic and given to patient.    Katrina Stack, RN 01/15/2017, 3:53 PM

## 2017-01-16 ENCOUNTER — Ambulatory Visit: Payer: Medicare Other | Admitting: Internal Medicine

## 2017-01-16 MED ORDER — DILTIAZEM HCL ER COATED BEADS 180 MG PO CP24: 180.0000 mg | ORAL_CAPSULE | Freq: Every day | ORAL | 0 refills | Status: AC

## 2017-01-16 MED ORDER — DILTIAZEM HCL ER COATED BEADS 180 MG PO CP24
180.0000 mg | ORAL_CAPSULE | Freq: Every day | ORAL | Status: DC
  Administered 2017-01-16: 180 mg via ORAL
  Filled 2017-01-16: qty 1

## 2017-01-16 MED ORDER — AMOXICILLIN-POT CLAVULANATE 250-62.5 MG/5ML PO SUSR: 500.0000 mg | Freq: Three times a day (TID) | ORAL | 0 refills | Status: DC

## 2017-01-16 NOTE — Clinical Social Work Note (Signed)
Patient transferred to 124 from 20, handoff completed and given to unit CSW.  This CSW to sign off.  Jones Broom. [place], MSW, [place]  01/16/2017 9:58 AM

## 2017-01-16 NOTE — Progress Notes (Signed)
Visit made. Patient seen lying with eyes closed, did not open to voice. Writer washed her face with a cool wash cloth, patient awakened and asked for the wash cloth to bewarmed. Writer washed her face with a  Warm wash cloth and also washed her hands, patient resisted some, pulling her arms away. Plan is for discharge back to [facility] today. Son Sherren Mocha has been notified by CSW Darden Dates and is in agreement. Writer to return when Sherren Mocha is present to discuss discharge. Hospice team made aware. Updated notes faxed to hospice triage. Thank you. Flo Shanks RN, BSN, [facility] [place], hospital liaison [phone] c

## 2017-01-16 NOTE — Progress Notes (Signed)
Patient discharged to [facility] per MD order. Report called to Patent examiner at facility. EMS called for transport.

## 2017-01-16 NOTE — Clinical Social Work Note (Signed)
Pt is ready for discharge today and will go to [facility] for LTC. Facility is ready to admit pt as they have received discharge information. Pt's son is agreeable to returning to facility with [facility] following. RN called report, and [facility] will provide transportation. CSW is signing off as no further needs identified.   Darden Dates, MSW, LCSW  Clinical Social Worker  [phone]

## 2017-01-16 NOTE — Evaluation (Signed)
Clinical/Bedside Swallow Evaluation Patient Details  Name: [name] MRN: [id number] Date of Birth: 13-Aug-1941  Today's Date: 01/16/2017 Time: SLP Start Time (ACUTE ONLY): 42 SLP Stop Time (ACUTE ONLY): 1130 SLP Time Calculation (min) (ACUTE ONLY): 60 min  Past Medical History:  Past Medical History:  Diagnosis Date  . A-fib ([place]) 02/14/2015  . Arthritis   . Atrial fibrillation ([place])   . Colovaginal fistula   . Complication of anesthesia    DIFFICULTY WAKING   . CVA (cerebral infarction) 02/14/2015  . Dementia   . GERD without esophagitis 02/14/2015  . Glucose intolerance (impaired glucose tolerance)   . Gout   . Hyperlipidemia   . Recurrent UTI   . Seizure ([place]) 02/14/2015   Past Surgical History:  Past Surgical History:  Procedure Laterality Date  . BUNIONECTOMY Right   . CHOLECYSTECTOMY    . Right total hip arthroplasty  1998  . Right total hip revision arthroplasty for a periprosthetic fracture  2007   HPI:  Pt is a 76 year old female resident of [facility]. She has a history of Dementia and seizures since her CVA. She's had one seizure in the past, she is maintained on Keppra. Per her sister the patient has been having small seizures the past week and a half. Her seizures are manifested by small withdrawing, contractions or staring. They had informed the nursing. Her urine has developed a foul oder and the nursing staff was informed. It is unclear if antibiotics were started. Her Keppra was increased, increased dosing to start last night. The patient has had some increased confusion. Today she had seizure. Her son who visits daily came in and found her foaming at the mouth and poorly responsive.    Assessment / Plan / Recommendation Clinical Impression  Pt appears at reduced risk for aspiration w/ oral intake when following aspiration precautions and providing feeding support. Pt does have Cognitive decline/Dementia which impacted her overall awareness  for task of eating and drinking during assessment today - this can increase risk for aspiration to occur. Pt required verbal and visual cues during feeding; required 100% feeding support today. She often talked w/ the puree boluses/residue still in mouth. No overt s/s of aspiration noted during/post trials; oral phase impacted by Cognitive status but when given time and support, she accepted boluses and cleared adequately. Recommend a Dysphagia level 1 diet w/ thin liquids; strict aspiratioin precautions; Pills in Puree; feeding support at all meals. Will provide ongoing tx and provide assessment w/ upgraded textured foods when appopriate. NSG updated.  SLP Visit Diagnosis: Dysphagia, oropharyngeal phase (R13.12)    Aspiration Risk   (reduced following precautions)    Diet Recommendation  Dysphagia level 1 w/ thin liquids; aspiration precautions; feeding support at meals - must reduce distractions  Medication Administration: Crushed with puree    Other  Recommendations Recommended Consults:  (Dietician) Oral Care Recommendations: Oral care BID;Staff/trained caregiver to provide oral care   Follow up Recommendations Skilled Nursing facility (TBD)      Frequency and Duration min 3x week  2 weeks       Prognosis Prognosis for Safe Diet Advancement: Fair (-Good) Barriers to Reach Goals: Cognitive deficits;Severity of deficits      Swallow Study   General Date of Onset: 01/13/17 HPI: Pt is a 76 year old female resident of [facility]. She has a history of Dementia and seizures since her CVA. She's had one seizure in the past, she is maintained on Keppra. Per  her sister the patient has been having small seizures the past week and a half. Her seizures are manifested by small withdrawing, contractions or staring. They had informed the nursing. Her urine has developed a foul oder and the nursing staff was informed. It is unclear if antibiotics were started. Her Keppra was  increased, increased dosing to start last night. The patient has had some increased confusion. Today she had seizure. Her son who visits daily came in and found her foaming at the mouth and poorly responsive.  Type of Study: Bedside Swallow Evaluation Previous Swallow Assessment: none indicated Diet Prior to this Study: Regular;Thin liquids (per chart note) Temperature Spikes Noted: No (wbc not elevated) Respiratory Status: Room air History of Recent Intubation: No Behavior/Cognition: Alert;Cooperative;Confused;Distractible;Requires cueing Oral Cavity Assessment: Dry (sticky) Oral Care Completed by SLP: Yes Oral Cavity - Dentition: Missing dentition (few) Vision:  (n/a ) Self-Feeding Abilities: Total assist Patient Positioning: Upright in bed Baseline Vocal Quality: Normal;Low vocal intensity Volitional Cough: Cognitively unable to elicit Volitional Swallow: Unable to elicit    Oral/Motor/Sensory Function Overall Oral Motor/Sensory Function: Within functional limits (grossly wfl)   Ice Chips Ice chips: Within functional limits Presentation: Spoon (fed; 3 trials) Other Comments: min decreased oral awareness for task   Thin Liquid Thin Liquid: Within functional limits Presentation: Cup;Straw (fed/assisted; ~3-4 ozs total) Other Comments: min decreased oral awareness for task    Nectar Thick Nectar Thick Liquid: Not tested   Honey Thick Honey Thick Liquid: Not tested   Puree Puree: Impaired Presentation: Spoon (fed; 10 trials total) Oral Phase Impairments:  (decreased oral awareness for task - talked during) Oral Phase Functional Implications: Oral residue (slight d/t talking) Pharyngeal Phase Impairments:  (none) Other Comments: talked during the trials; min decreased oral awareness for task   Solid   GO   Solid: Not tested Other Comments: will continue to assess ongoing as Cogntive status improves          Orinda Kenner, MS, CCC-SLP Suzie Vandam 01/16/2017,8:12  AM

## 2017-01-16 NOTE — Progress Notes (Signed)
  Speech Language Pathology Treatment: Dysphagia  Patient Details Name: [name] MRN: [id number] DOB: 07/26/1941 Today's Date: 01/16/2017 Time: [id number] SLP Time Calculation (min) (ACUTE ONLY): 45 min  Assessment / Plan / Recommendation Clinical Impression  Pt appears at reduced risk for aspiration w/ oral intake when following aspiration precautions and providing feeding support. Pt does have Cognitive decline/Dementia which impacted her overall awareness for task of eating and drinking during assessment today - this can increase risk for aspiration to occur. Pt required verbal and visual cues during feeding; required 100% feeding support today. Pt seen with trials of puree and thin liquids with no overt s/s of aspiration noted during/post trials; oral phase impacted by Cognitive status but when given time and support, she accepted boluses and cleared adequately. Recommend a Dysphagia level 1 diet w/ thin liquids; strict aspiratioin precautions; Pills Crushed in Puree; feeding support at all meals. Pt did appear to have pocketed pill in mouth that she spit out while accepting trials of puree, NSG informed.  Will provide ongoing tx and provide assessment w/ upgraded textured foods when appopriate. NSG updated.    HPI HPI: Pt is a 76 year old female resident of [facility]. She has a history of Dementia and seizures since her CVA. She's had one seizure in the past, she is maintained on Keppra. Per her sister the patient has been having small seizures the past week and a half. Her seizures are manifested by small withdrawing, contractions or staring. They had informed the nursing. Her urine has developed a foul oder and the nursing staff was informed. It is unclear if antibiotics were started. Her Keppra was increased, increased dosing to start last night. The patient has had some increased confusion. Today she had seizure. Her son who visits daily came in and found her foaming at the  mouth and poorly responsive.       SLP Plan  Continue with current plan of care       Recommendations  Diet recommendations: Dysphagia 1 (puree);Thin liquid Liquids provided via: Cup;Straw Medication Administration: Crushed with puree Supervision: Staff to assist with self feeding;Full supervision/cueing for compensatory strategies Compensations: Minimize environmental distractions;Slow rate;Small sips/bites;Follow solids with liquid Postural Changes and/or Swallow Maneuvers: Seated upright 90 degrees;Upright 30-60 min after meal                Oral Care Recommendations: Oral care BID;Staff/trained caregiver to provide oral care Follow up Recommendations: Skilled Nursing facility SLP Visit Diagnosis: Dysphagia, oropharyngeal phase (R13.12) Plan: Continue with current plan of care       GO                Eulogio Ditch, B.S Graduate Clinician  01/16/2017, 11:09 AM  This information has been reviewed and agreed upon by this supervising clinician.  Orinda Kenner, [place], CCC-SLP

## 2017-01-16 NOTE — Discharge Summary (Signed)
[name] NAME: [name]    MR#:  [id number]  DATE OF BIRTH:  06/25/1941  DATE OF ADMISSION:  01/13/2017   ADMITTING PHYSICIAN: Quintella Baton, MD  DATE OF DISCHARGE: 01/16/2017  PRIMARY CARE PHYSICIAN: Kirk Ruths., MD   ADMISSION DIAGNOSIS:  Seizure ([place]) [R56.9] Acute cystitis without hematuria [N30.00] Sepsis, due to unspecified organism ([place]) [A41.9] DISCHARGE DIAGNOSIS:  Active Problems:   Cerebral infarction ([place])   Seizure ([place])   UTI (urinary tract infection)   Hyperlipidemia, unspecified  SECONDARY DIAGNOSIS:   Past Medical History:  Diagnosis Date  . A-fib ([place]) 02/14/2015  . Arthritis   . Atrial fibrillation ([place])   . Colovaginal fistula   . Complication of anesthesia    DIFFICULTY WAKING   . CVA (cerebral infarction) 02/14/2015  . Dementia   . GERD without esophagitis 02/14/2015  . Glucose intolerance (impaired glucose tolerance)   . Gout   . Hyperlipidemia   . Recurrent UTI   . Seizure ([place]) 02/14/2015   HOSPITAL COURSE:  76 year old female resident of [facility] for . 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   #Encephalopathy metabolic/post ictal Significantly improved. Patient is at her baseline according to the son  Follow-up with neurology as an outpatient as need  #Urinary tract infection: treated -Urine culture contaminated  Discontinue ciprofloxacin  #Chronic history of seizures from old stroke with probable breakthrough seizures -EEG negative  -Likely due to above. Infection decreased seizures threshold -Continue by mouth Keppra 750 mg twice a day -Continue Depakote at bedtime  #Hypokalemia: Repleted and resolved  # Aspiration pneumonia  Initial chest x-rays negative but patient with rhonchi and audible congestion repeat chest x-ray with possible atelectasis/small infiltrate Patient was seen and evaluated by speech therapy who is recommending  dysphagia type I diet. Patient is tolerating fine -Being discharged on oral Augmentin    #chronic Sacral decubitus stage 2 present at the time of admission  Left heel eschar present at the time of admission  Large right calf decub at ankle, smaller ulcer noted above presented at time of admission -woundcare consult, continue santyl -Frequent repositioning of the patient -Patient seen by orthopedics as an outpatient. Family informed that patient currently not a surgical candidate due to poor nutritional status and health. Improve nutritional encouraged. Wound care consulted  * chronic atrial fibrillation-on chronic anticoggulation with Eliquis -Heart rate still in high 100s-110, Cardizem changed to long-acting with increased dose of 180 mg  * Elevated troponin -Chronic.  Due to supply demand ischemia  * CVA chronic right sided weakness With contractures  * Vascular dementia -Chronic  *Chronic rectovaginal fistula -no surgical intervention planned due to patient's overall poor health  Please note patient has very poor functional status and is bed-bound with multiple chronic sacral decubitus and muscle contractures and extremities.  She is already followed by [facility] at [facility]  Overall prognosis is extremely poor.  She remains at very high risk for recurrent infection including UTI and for aspiration pneumonia.  Family is well aware of some. DISCHARGE CONDITIONS:  fair CONSULTS OBTAINED:  Treatment Team:  Catarina Hartshorn, MD DRUG ALLERGIES:  No Known Allergies DISCHARGE MEDICATIONS:   Allergies as of 01/16/2017   No Known Allergies     Medication List    STOP taking these medications   diltiazem 30 MG tablet Commonly known as:  CARDIZEM   sulfamethoxazole-trimethoprim 800-160 MG tablet Commonly known as:  BACTRIM DS,SEPTRA  DS     TAKE these medications   acetaminophen 325 MG tablet Commonly known as:  TYLENOL Take 2 tablets (650 mg total) by mouth  every 6 (six) hours as needed for mild pain (or Fever >/= 101).   amoxicillin-clavulanate 250-62.5 MG/5ML suspension Commonly known as:  AUGMENTIN Take 10 mLs (500 mg total) by mouth 3 (three) times daily.   apixaban 5 MG Tabs tablet Commonly known as:  ELIQUIS Take 1 tablet (5 mg total) by mouth 2 (two) times daily.   ascorbic acid 500 MG tablet Commonly known as:  VITAMIN C Take 500 mg by mouth daily.   collagenase ointment Commonly known as:  SANTYL Apply 1 application topically every other day. To left heel   DEPAKOTE SPRINKLES 125 MG capsule Generic drug:  divalproex Take 125 mg by mouth at bedtime.   diltiazem 180 MG 24 hr capsule Commonly known as:  CARDIZEM CD Take 1 capsule (180 mg total) by mouth daily.   donepezil 5 MG tablet Commonly known as:  ARICEPT Take 1 tablet (5 mg total) by mouth at bedtime.   esomeprazole 20 MG capsule Commonly known as:  NEXIUM Take 20 mg by mouth daily at 12 noon.   feeding supplement (ENSURE ENLIVE) Liqd Take 237 mLs by mouth 2 (two) times daily between meals.   feeding supplement (PRO-STAT 64) Liqd Take 30 mLs by mouth daily.   furosemide 20 MG tablet Commonly known as:  LASIX Take 20 mg by mouth daily.   guaifenesin 100 MG/5ML syrup Commonly known as:  ROBITUSSIN Take 100 mg by mouth 3 (three) times daily as needed for cough.   ketotifen 0.025 % ophthalmic solution Commonly known as:  ZADITOR Place 1 drop into both eyes daily as needed (for dry eyes).   levETIRAcetam 100 MG/ML solution Commonly known as:  KEPPRA Take 500 mg by mouth 2 (two) times daily.   levETIRAcetam 500 MG tablet Commonly known as:  KEPPRA Take 500 mg by mouth 2 (two) times daily.   megestrol 40 MG tablet Commonly known as:  MEGACE Take 1 tablet (40 mg total) by mouth daily.   memantine 10 MG tablet Commonly known as:  NAMENDA Take 10 mg by mouth 2 (two) times daily.   mineral oil enema Place 1 enema rectally as needed for severe  constipation.   polyethylene glycol packet Commonly known as:  MIRALAX Take 17 g by mouth daily.   potassium chloride 10 MEQ CR tablet Commonly known as:  KLOR-CON Take 10 mEq by mouth daily.   QUEtiapine 50 MG tablet Commonly known as:  SEROQUEL Take 1 tablet (50 mg total) by mouth at bedtime. What changed:  how much to take   senna-docusate 8.6-50 MG tablet Commonly known as:  SENOKOT S Take 1 tablet by mouth daily.   simvastatin 20 MG tablet Commonly known as:  ZOCOR Take 20 mg by mouth daily.   traMADol 50 MG tablet Commonly known as:  ULTRAM Take 1 tablet (50 mg total) by mouth every 6 (six) hours as needed for moderate pain or severe pain.      DISCHARGE INSTRUCTIONS:   DIET:  Dysphagia 1 diet  please give Pills in Puree- CRUSHED. Feeding assist at meals - MUST sit Upright, maybe a towel roll behind the head. Encourage po's, reduce distractions. DISCHARGE CONDITION:  Fair ACTIVITY:  Activity as tolerated OXYGEN:  Home Oxygen: No.  Oxygen Delivery: room air DISCHARGE LOCATION:  nursing home   If you experience worsening of your  admission symptoms, develop shortness of breath, life threatening emergency, suicidal or homicidal thoughts you must seek medical attention immediately by calling 911 or calling your MD immediately  if symptoms less severe.  You Must read complete instructions/literature along with all the possible adverse reactions/side effects for all the Medicines you take and that have been prescribed to you. Take any new Medicines after you have completely understood and accpet all the possible adverse reactions/side effects.   Please note  You were cared for by a hospitalist during your hospital stay. If you have any questions about your discharge medications or the care you received while you were in the hospital after you are discharged, you can call the unit and asked to speak with the hospitalist on call if the hospitalist that took care of you  is not available. Once you are discharged, your primary care physician will handle any further medical issues. Please note that NO REFILLS for any discharge medications will be authorized once you are discharged, as it is imperative that you return to your primary care physician (or establish a relationship with a primary care physician if you do not have one) for your aftercare needs so that they can reassess your need for medications and monitor your lab values.   On the day of Discharge:  VITAL SIGNS:  Blood pressure 104/60, pulse (!) 117, temperature 98.7 F (37.1 C), temperature source Oral, resp. rate 20, height 5' 0.5" (1.537 m), weight 102.1 kg (225 lb), SpO2 100 %. PHYSICAL EXAMINATION:  GENERAL:  76 y.o.-year-old patient lying in the bed with no acute distress.  EYES: Pupils equal, round, reactive to light and accommodation. No scleral icterus. Extraocular muscles intact.  HEENT: Head atraumatic, normocephalic. Oropharynx and nasopharynx clear.  NECK:  Supple, no jugular venous distention. No thyroid enlargement, no tenderness.  LUNGS: Normal breath sounds bilaterally, no wheezing, rales,rhonchi or crepitation. No use of accessory muscles of respiration.  CARDIOVASCULAR: S1, S2 normal. No murmurs, rubs, or gallops.  ABDOMEN: Soft, non-tender, non-distended. Bowel sounds present. No organomegaly or mass.  EXTREMITIES: No pedal edema, cyanosis, or clubbing.  NEUROLOGIC: bedbound female, strength 0/5 B/L LE, difficult to assess  Skin: no rash, stage 2 sacral decub, large left heel ulcer, large right calf decub ligaments seen Psych: at baseline  DATA REVIEW:   CBC  Recent Labs Lab 01/15/17 0504  WBC 6.1  HGB 11.6*  HCT 35.3  PLT 242    Chemistries   Recent Labs Lab 01/13/17 1538  01/15/17 0504  NA  --   < > 139  K  --   < > 4.6  CL  --   < > 114*  CO2  --   < > 18*  GLUCOSE  --   < > 75  BUN  --   < > <5*  CREATININE  --   < > 0.52  CALCIUM  --   < > 8.2*  AST 14*   --   --   ALT 13*  --   --   ALKPHOS 75  --   --   BILITOT 1.1  --   --   < > = values in this interval not displayed.   Follow-up Information    Kirk Ruths., MD. Schedule an appointment as soon as possible for a visit on 01/24/2017.   Specialty:  Internal Medicine Why:  at 1:00 p.m. Contact information: [address] [place] [address] [phone]  Management plans discussed with the patient, family and they are in agreement.  CODE STATUS: DNR she is followed by hospice at [facility].  Remains at very high risk for readmission  TOTAL TIME TAKING CARE OF THIS PATIENT: 45 minutes.    Max Sane M.D on 01/16/2017 at 10:15 AM  Between 7am to 6pm - Pager - [phone]  After 6pm go to [email or web address] - Proofreader  [facility]  Office  [phone]  CC: Primary care physician; Kirk Ruths., MD   Note: This dictation was prepared with Dragon dictation along with smaller phrase technology. Any transcriptional errors that result from this process are unintentional.

## 2017-01-16 NOTE — Progress Notes (Addendum)
Patient has no acute event overnight . She was repositioned Q2 and assisted overnight. She remained in asymptomatic afib with stable VS. Patient was transferred to 1C per order . Patient's son was notified of patient's transfer to 1C

## 2017-01-16 NOTE — Discharge Instructions (Signed)
Aspiration Pneumonia °Aspiration pneumonia is an infection in your lungs. It occurs when food, liquid, or stomach contents (vomit) are inhaled (aspirated) into your lungs. When these things get into your lungs, swelling (inflammation) and infection can occur. This can make it difficult for you to breathe. Aspiration pneumonia is a serious condition and can be life threatening. °What increases the risk? °Aspiration pneumonia is more likely to occur when a person's cough (gag) reflex or ability to swallow has been decreased. Some things that can do this include: °· Having a brain injury or disease, such as stroke, seizures, Parkinson's disease, dementia, or amyotrophic lateral sclerosis (ALS). °· Being given general anesthetic for procedures. °· Being in a coma (unconscious). °· Having a narrowing of the tube that carries food to the stomach (esophagus). °· Drinking too much alcohol. If a person passes out and vomits, vomit can be swallowed into the lungs. °· Taking certain medicines, such as tranquilizers or sedatives. ° °What are the signs or symptoms? °· Coughing after swallowing food or liquids. °· Breathing problems, such as wheezing or shortness of breath. °· Bluish skin. This can be caused by lack of oxygen. °· Coughing up food or mucus. The mucus might contain blood, greenish material, or yellowish-white fluid (pus). °· Fever. °· Chest pain. °· Being more tired than usual (fatigue). °· Sweating more than usual. °· Bad breath. °How is this diagnosed? °A physical exam will be done. During the exam, the health care provider will listen to your lungs with a stethoscope to check for: °· Crackling sounds in the lungs. °· Decreased breath sounds. °· A rapid heartbeat. ° °Various tests may be ordered. These may include: °· Chest X-ray. °· CT scan. °· Swallowing study. This test looks at how food is swallowed and whether it goes into your breathing tube (trachea) or food pipe (esophagus). °· Sputum culture. Saliva and  mucus (sputum) are collected from the lungs or the tubes that carry air to the lungs (bronchi). The sputum is then tested for bacteria. °· Bronchoscopy. This test uses a flexible tube (bronchoscope) to see inside the lungs. ° °How is this treated? °Treatment will usually include antibiotic medicines. Other medicines may also be used to reduce fever or pain. You may need to be treated in the hospital. In the hospital, your breathing will be carefully monitored. Depending on how well you are breathing, you may need to be given oxygen, or you may need breathing support from a breathing machine (ventilator). For people who fail a swallowing study, a feeding tube might be placed in the stomach, or they may be asked to avoid certain food textures or liquids when they eat. °Follow these instructions at home: °· Carefully follow any special eating instructions you were given, such as avoiding certain food textures or thickening liquids. This reduces the risk of developing aspiration pneumonia again. °· Only take over-the-counter or prescription medicines as directed by your health care provider. Follow the directions carefully. °· If you were prescribed antibiotics, take them as directed. Finish them even if you start to feel better. °· Rest as instructed by your health care provider. °· Keep all follow-up appointments with your health care provider. °Contact a health care provider if: °· You develop worsening shortness of breath, wheezing, or difficulty breathing. °· You develop a fever. °· You have chest pain. °This information is not intended to replace advice given to you by your health care provider. Make sure you discuss any questions you have with your health care   provider. °Document Released: 09/02/2009 Document Revised: 04/18/2016 Document Reviewed: 04/23/2013 °Elsevier Interactive Patient Education © 2017 [employer] ° °

## 2017-01-18 LAB — CULTURE, BLOOD (ROUTINE X 2)
CULTURE: NO GROWTH
Culture: NO GROWTH

## 2017-01-21 ENCOUNTER — Emergency Department

## 2017-01-21 ENCOUNTER — Inpatient Hospital Stay
Admission: EM | Admit: 2017-01-21 | Discharge: 2017-01-23 | DRG: 100 | Disposition: A | Discharge status: Skilled Nursing Facility | Attending: Internal Medicine | Admitting: Internal Medicine

## 2017-01-21 ENCOUNTER — Encounter: Payer: Self-pay | Admitting: Emergency Medicine

## 2017-01-21 DIAGNOSIS — I4891 Unspecified atrial fibrillation: Secondary | ICD-10-CM | POA: Diagnosis present

## 2017-01-21 DIAGNOSIS — Z66 Do not resuscitate: Secondary | ICD-10-CM | POA: Diagnosis present

## 2017-01-21 DIAGNOSIS — Z96641 Presence of right artificial hip joint: Secondary | ICD-10-CM | POA: Diagnosis present

## 2017-01-21 DIAGNOSIS — L89153 Pressure ulcer of sacral region, stage 3: Secondary | ICD-10-CM | POA: Diagnosis present

## 2017-01-21 DIAGNOSIS — N390 Urinary tract infection, site not specified: Secondary | ICD-10-CM | POA: Diagnosis present

## 2017-01-21 DIAGNOSIS — R4182 Altered mental status, unspecified: Secondary | ICD-10-CM | POA: Diagnosis present

## 2017-01-21 DIAGNOSIS — R569 Unspecified convulsions: Secondary | ICD-10-CM | POA: Diagnosis not present

## 2017-01-21 DIAGNOSIS — F0390 Unspecified dementia without behavioral disturbance: Secondary | ICD-10-CM | POA: Diagnosis present

## 2017-01-21 DIAGNOSIS — E785 Hyperlipidemia, unspecified: Secondary | ICD-10-CM | POA: Diagnosis present

## 2017-01-21 DIAGNOSIS — L89620 Pressure ulcer of left heel, unstageable: Secondary | ICD-10-CM | POA: Diagnosis present

## 2017-01-21 DIAGNOSIS — E876 Hypokalemia: Secondary | ICD-10-CM | POA: Diagnosis present

## 2017-01-21 DIAGNOSIS — I69351 Hemiplegia and hemiparesis following cerebral infarction affecting right dominant side: Secondary | ICD-10-CM

## 2017-01-21 DIAGNOSIS — Z7901 Long term (current) use of anticoagulants: Secondary | ICD-10-CM

## 2017-01-21 DIAGNOSIS — R40243 Glasgow coma scale score 3-8, unspecified time: Secondary | ICD-10-CM | POA: Diagnosis present

## 2017-01-21 DIAGNOSIS — R402432 Glasgow coma scale score 3-8, at arrival to emergency department: Secondary | ICD-10-CM

## 2017-01-21 DIAGNOSIS — L89894 Pressure ulcer of other site, stage 4: Secondary | ICD-10-CM | POA: Diagnosis present

## 2017-01-21 DIAGNOSIS — Z6841 Body Mass Index (BMI) 40.0 and over, adult: Secondary | ICD-10-CM | POA: Diagnosis not present

## 2017-01-21 DIAGNOSIS — Z79899 Other long term (current) drug therapy: Secondary | ICD-10-CM

## 2017-01-21 DIAGNOSIS — K219 Gastro-esophageal reflux disease without esophagitis: Secondary | ICD-10-CM | POA: Diagnosis present

## 2017-01-21 DIAGNOSIS — I248 Other forms of acute ischemic heart disease: Secondary | ICD-10-CM | POA: Diagnosis present

## 2017-01-21 DIAGNOSIS — N823 Fistula of vagina to large intestine: Secondary | ICD-10-CM | POA: Diagnosis present

## 2017-01-21 DIAGNOSIS — L89893 Pressure ulcer of other site, stage 3: Secondary | ICD-10-CM | POA: Diagnosis present

## 2017-01-21 LAB — URINALYSIS, COMPLETE (UACMP) WITH MICROSCOPIC
BILIRUBIN URINE: NEGATIVE
Bacteria, UA: NONE SEEN
GLUCOSE, UA: NEGATIVE mg/dL
KETONES UR: NEGATIVE mg/dL
NITRITE: NEGATIVE
PH: 6 (ref 5.0–8.0)
Protein, ur: NEGATIVE mg/dL
Specific Gravity, Urine: 1.012 (ref 1.005–1.030)

## 2017-01-21 LAB — CBC WITH DIFFERENTIAL/PLATELET
Basophils Absolute: 0 10*3/uL (ref 0–0.1)
Basophils Relative: 1 %
Eosinophils Absolute: 0.1 10*3/uL (ref 0–0.7)
Eosinophils Relative: 2 %
HEMATOCRIT: 37.3 % (ref 35.0–47.0)
Hemoglobin: 12.1 g/dL (ref 12.0–16.0)
LYMPHS PCT: 31 %
Lymphs Abs: 1.7 10*3/uL (ref 1.0–3.6)
MCH: 32.1 pg (ref 26.0–34.0)
MCHC: 32.4 g/dL (ref 32.0–36.0)
MCV: 99.1 fL (ref 80.0–100.0)
MONO ABS: 0.5 10*3/uL (ref 0.2–0.9)
MONOS PCT: 10 %
NEUTROS ABS: 3.1 10*3/uL (ref 1.4–6.5)
Neutrophils Relative %: 56 %
Platelets: 237 10*3/uL (ref 150–440)
RBC: 3.76 MIL/uL — ABNORMAL LOW (ref 3.80–5.20)
RDW: 15.2 % — AB (ref 11.5–14.5)
WBC: 5.4 10*3/uL (ref 3.6–11.0)

## 2017-01-21 LAB — COMPREHENSIVE METABOLIC PANEL
ALT: 11 U/L — AB (ref 14–54)
AST: 20 U/L (ref 15–41)
Albumin: 2.8 g/dL — ABNORMAL LOW (ref 3.5–5.0)
Alkaline Phosphatase: 90 U/L (ref 38–126)
Anion gap: 5 (ref 5–15)
CHLORIDE: 107 mmol/L (ref 101–111)
CO2: 26 mmol/L (ref 22–32)
CREATININE: 0.46 mg/dL (ref 0.44–1.00)
Calcium: 8.5 mg/dL — ABNORMAL LOW (ref 8.9–10.3)
GFR calc Af Amer: >60 mL/min (ref >60)
GFR calc non Af Amer: >60 mL/min (ref >60)
Glucose, Bld: 89 mg/dL (ref 65–99)
Potassium: 2.6 mmol/L — CL (ref 3.5–5.1)
Sodium: 138 mmol/L (ref 135–145)
Total Bilirubin: 1.1 mg/dL (ref 0.3–1.2)
Total Protein: 6.3 g/dL — ABNORMAL LOW (ref 6.5–8.1)

## 2017-01-21 LAB — INFLUENZA PANEL BY PCR (TYPE A & B)
Influenza A By PCR: NEGATIVE
Influenza B By PCR: NEGATIVE

## 2017-01-21 LAB — VALPROIC ACID LEVEL: Valproic Acid Lvl: <10 ug/mL — ABNORMAL LOW (ref 50.0–100.0)

## 2017-01-21 LAB — PROTIME-INR
INR: 1.57
Prothrombin Time: 18.9 seconds — ABNORMAL HIGH (ref 11.4–15.2)

## 2017-01-21 LAB — LACTIC ACID, PLASMA: LACTIC ACID, VENOUS: 1.5 mmol/L (ref 0.5–1.9)

## 2017-01-21 LAB — APTT: aPTT: 30 seconds (ref 24–36)

## 2017-01-21 LAB — LIPASE, BLOOD: LIPASE: 13 U/L (ref 11–51)

## 2017-01-21 LAB — TROPONIN I: Troponin I: 0.09 ng/mL (ref <0.03)

## 2017-01-21 MED ORDER — ONDANSETRON HCL 4 MG/2ML IJ SOLN: 4.0000 mg | Freq: Four times a day (QID) | INTRAMUSCULAR | Status: DC | PRN

## 2017-01-21 MED ORDER — MEMANTINE HCL 5 MG PO TABS: 10.0000 mg | ORAL_TABLET | Freq: Two times a day (BID) | ORAL | Status: DC

## 2017-01-21 MED ORDER — PRO-STAT SUGAR FREE PO LIQD: 30.0000 mL | Freq: Every day | ORAL | Status: DC

## 2017-01-21 MED ORDER — SENNOSIDES-DOCUSATE SODIUM 8.6-50 MG PO TABS: 1.0000 | ORAL_TABLET | Freq: Every evening | ORAL | Status: DC | PRN

## 2017-01-21 MED ORDER — BISACODYL 5 MG PO TBEC: 5.0000 mg | DELAYED_RELEASE_TABLET | Freq: Every day | ORAL | Status: DC | PRN

## 2017-01-21 MED ORDER — COLLAGENASE 250 UNIT/GM EX OINT
1.0000 "application " | TOPICAL_OINTMENT | CUTANEOUS | Status: DC
  Filled 2017-01-21: qty 30

## 2017-01-21 MED ORDER — VANCOMYCIN HCL IN DEXTROSE 1-5 GM/200ML-% IV SOLN
1000.0000 mg | Freq: Two times a day (BID) | INTRAVENOUS | Status: DC
  Administered 2017-01-22: 1000 mg via INTRAVENOUS
  Filled 2017-01-21 (×2): qty 200

## 2017-01-21 MED ORDER — MAGNESIUM CITRATE PO SOLN
1.0000 | Freq: Once | ORAL | Status: DC | PRN
  Filled 2017-01-21: qty 296

## 2017-01-21 MED ORDER — LEVETIRACETAM 100 MG/ML PO SOLN: 750.0000 mg | Freq: Two times a day (BID) | ORAL | Status: DC

## 2017-01-21 MED ORDER — SODIUM CHLORIDE 0.9 % IV SOLN
INTRAVENOUS | Status: DC
  Administered 2017-01-21: via INTRAVENOUS

## 2017-01-21 MED ORDER — SIMVASTATIN 20 MG PO TABS: 20.0000 mg | ORAL_TABLET | Freq: Every day | ORAL | Status: DC

## 2017-01-21 MED ORDER — ONDANSETRON HCL 4 MG PO TABS: 4.0000 mg | ORAL_TABLET | Freq: Four times a day (QID) | ORAL | Status: DC | PRN

## 2017-01-21 MED ORDER — PIPERACILLIN-TAZOBACTAM 3.375 G IVPB
3.3750 g | Freq: Three times a day (TID) | INTRAVENOUS | Status: DC
  Administered 2017-01-22 (×2): 3.375 g via INTRAVENOUS
  Filled 2017-01-21 (×2): qty 50

## 2017-01-21 MED ORDER — APIXABAN 5 MG PO TABS: 5.0000 mg | ORAL_TABLET | Freq: Two times a day (BID) | ORAL | Status: DC

## 2017-01-21 MED ORDER — TRAMADOL HCL 50 MG PO TABS: 50.0000 mg | ORAL_TABLET | Freq: Four times a day (QID) | ORAL | Status: DC | PRN

## 2017-01-21 MED ORDER — POLYETHYLENE GLYCOL 3350 17 G PO PACK: 17.0000 g | PACK | Freq: Every day | ORAL | Status: DC

## 2017-01-21 MED ORDER — ASPIRIN 325 MG PO TABS: 325.0000 mg | ORAL_TABLET | Freq: Every day | ORAL | Status: DC

## 2017-01-21 MED ORDER — DILTIAZEM HCL ER COATED BEADS 180 MG PO CP24: 180.0000 mg | ORAL_CAPSULE | Freq: Every day | ORAL | Status: DC

## 2017-01-21 MED ORDER — ACETAMINOPHEN 650 MG RE SUPP: 650.0000 mg | Freq: Four times a day (QID) | RECTAL | Status: DC | PRN

## 2017-01-21 MED ORDER — GUAIFENESIN 100 MG/5ML PO SYRP
100.0000 mg | ORAL_SOLUTION | Freq: Three times a day (TID) | ORAL | Status: DC | PRN
  Filled 2017-01-21: qty 5

## 2017-01-21 MED ORDER — PANTOPRAZOLE SODIUM 40 MG PO TBEC: 40.0000 mg | DELAYED_RELEASE_TABLET | Freq: Every day | ORAL | Status: DC

## 2017-01-21 MED ORDER — LEVETIRACETAM 500 MG PO TABS: 500.0000 mg | ORAL_TABLET | Freq: Two times a day (BID) | ORAL | Status: DC

## 2017-01-21 MED ORDER — ASPIRIN EC 81 MG PO TBEC: 81.0000 mg | DELAYED_RELEASE_TABLET | Freq: Every day | ORAL | Status: DC

## 2017-01-21 MED ORDER — SODIUM CHLORIDE 0.9% FLUSH: 3.0000 mL | Freq: Two times a day (BID) | INTRAVENOUS | Status: DC

## 2017-01-21 MED ORDER — OXYCODONE HCL 5 MG PO TABS
5.0000 mg | ORAL_TABLET | ORAL | Status: DC | PRN
  Administered 2017-01-22: 5 mg via ORAL
  Filled 2017-01-21: qty 1

## 2017-01-21 MED ORDER — FUROSEMIDE 20 MG PO TABS
20.0000 mg | ORAL_TABLET | Freq: Every day | ORAL | Status: DC
Start: 2017-01-22 — End: 2017-01-23

## 2017-01-21 MED ORDER — SENNOSIDES-DOCUSATE SODIUM 8.6-50 MG PO TABS: 1.0000 | ORAL_TABLET | Freq: Every day | ORAL | Status: DC

## 2017-01-21 MED ORDER — IPRATROPIUM BROMIDE 0.02 % IN SOLN
0.5000 mg | Freq: Four times a day (QID) | RESPIRATORY_TRACT | Status: DC | PRN
Start: 2017-01-21 — End: 2017-01-23

## 2017-01-21 MED ORDER — PIPERACILLIN-TAZOBACTAM 3.375 G IVPB 30 MIN
3.3750 g | Freq: Once | INTRAVENOUS | Status: AC
  Administered 2017-01-21: 3.375 g via INTRAVENOUS
  Filled 2017-01-21: qty 50

## 2017-01-21 MED ORDER — KETOTIFEN FUMARATE 0.025 % OP SOLN
1.0000 [drp] | Freq: Every day | OPHTHALMIC | Status: DC | PRN
  Filled 2017-01-21: qty 5

## 2017-01-21 MED ORDER — SODIUM CHLORIDE 0.9 % IV SOLN
30.0000 meq | Freq: Once | INTRAVENOUS | Status: AC
  Administered 2017-01-21: 30 meq via INTRAVENOUS
  Filled 2017-01-21 (×2): qty 15

## 2017-01-21 MED ORDER — VANCOMYCIN HCL IN DEXTROSE 1-5 GM/200ML-% IV SOLN
1000.0000 mg | Freq: Once | INTRAVENOUS | Status: AC
  Administered 2017-01-21: 1000 mg via INTRAVENOUS
  Filled 2017-01-21: qty 200

## 2017-01-21 MED ORDER — VITAMIN C 500 MG PO TABS: 500.0000 mg | ORAL_TABLET | Freq: Every day | ORAL | Status: DC

## 2017-01-21 MED ORDER — ALBUTEROL SULFATE (2.5 MG/3ML) 0.083% IN NEBU: 2.5000 mg | INHALATION_SOLUTION | Freq: Four times a day (QID) | RESPIRATORY_TRACT | Status: DC | PRN

## 2017-01-21 MED ORDER — ENSURE ENLIVE PO LIQD: 237.0000 mL | Freq: Two times a day (BID) | ORAL | Status: DC

## 2017-01-21 MED ORDER — SODIUM CHLORIDE 0.9 % IV BOLUS (SEPSIS)
2000.0000 mL | Freq: Once | INTRAVENOUS | Status: AC
  Administered 2017-01-21: 2000 mL via INTRAVENOUS

## 2017-01-21 MED ORDER — DIVALPROEX SODIUM 125 MG PO CSDR: 125.0000 mg | DELAYED_RELEASE_CAPSULE | Freq: Every day | ORAL | Status: DC

## 2017-01-21 MED ORDER — ACETAMINOPHEN 325 MG PO TABS: 650.0000 mg | ORAL_TABLET | Freq: Four times a day (QID) | ORAL | Status: DC | PRN

## 2017-01-21 MED ORDER — QUETIAPINE FUMARATE 25 MG PO TABS: 50.0000 mg | ORAL_TABLET | Freq: Every day | ORAL | Status: DC

## 2017-01-21 NOTE — ED Provider Notes (Signed)
[facility] Emergency Department Provider Note  ____________________________________________  Time seen: Approximately 8:27 PM  I have reviewed the triage vital signs and the nursing notes.   HISTORY  Chief Complaint Altered Mental Status and Seizures Level 5 caveat:  Portions of the history and physical were unable to be obtained due to the patient's altered mental status  Additional history obtained from son when available at the bedside  HPI [name] is a 76 y.o. female brought to the ED by EMS due to seizure and unresponsiveness at [facility].   She was recently treated in the hospital for urinary tract infection. She has a rectovaginal fistula. She was discharged on Augmentin to [facility]. However, over the last few days she has had congestion and hoarse breathing. Today she became unresponsive so EMS was called. EMS report that on their arrival the patient was having a seizure. The 4 mg IM versed with resolution of seizure activity. On arrival patient is unresponsive. Breathing spontaneously, adequately. Spontaneous circulation present.  CODE STATUS is DO NOT RESUSCITATE.  Past Medical History:  Diagnosis Date  . A-fib ([place]) 02/14/2015  . Arthritis   . Atrial fibrillation ([place])   . Colovaginal fistula   . Complication of anesthesia    DIFFICULTY WAKING   . CVA (cerebral infarction) 02/14/2015  . Dementia   . GERD without esophagitis 02/14/2015  . Glucose intolerance (impaired glucose tolerance)   . Gout   . Hyperlipidemia   . Recurrent UTI   . Seizure ([place]) 02/14/2015     Patient Active Problem List   Diagnosis Date Noted  . Palliative care encounter   . Goals of care, counseling/discussion   . DNR (do not resuscitate) discussion   . Sepsis ([place]) 10/31/2016  . Pressure injury of skin 10/31/2016  . Allergic state 10/05/2016  . Arthritis 10/05/2016  . Glucose intolerance (impaired glucose tolerance) 10/05/2016  . History of  gout 10/05/2016  . Hyperlipidemia, unspecified 10/05/2016  . Stroke ([place]) 10/05/2016  . Delirium due to another medical condition 07/09/2016  . 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 Fracture, tibial plateau 04/01/2015  . Fall   . Knee fracture, left 03/29/2015  . UTI (urinary tract infection) 03/29/2015  . Mental status, decreased 02/14/2015  . A-fib ([place]) 02/14/2015  . Cerebral infarction ([place]) 02/14/2015  . GERD (gastroesophageal reflux disease) 02/14/2015  . Seizure ([place]) 02/14/2015  . Dementia 02/14/2015  . GERD without esophagitis 02/14/2015  . History of CVA (cerebrovascular accident)   . Long term current use of anticoagulant therapy 08/08/2011     Past Surgical History:  Procedure Laterality Date  . BUNIONECTOMY Right   . CHOLECYSTECTOMY    . Right total hip arthroplasty  1998  . Right total hip revision arthroplasty for a periprosthetic fracture  2007     Prior to Admission medications   Medication Sig Start Date End Date Taking? Authorizing Provider  acetaminophen (TYLENOL) 325 MG tablet Take 2 tablets (650 mg total) by mouth every 6 (six) hours as needed for mild pain (or Fever >/= 101). 11/05/16  Yes Lytle Butte, MD  Amino Acids-Protein Hydrolys (FEEDING SUPPLEMENT, PRO-STAT 64,) LIQD Take 30 mLs by mouth daily.   Yes Historical Provider, MD  amoxicillin-clavulanate (AUGMENTIN) 250-62.5 MG/5ML suspension Take 10 mLs (500 mg total) by mouth 3 (three) times daily. 01/16/17  Yes Vipul Manuella Ghazi, MD  apixaban (ELIQUIS) 5 MG TABS tablet Take 1 tablet (5 mg total) by mouth 2 (two) times daily. 04/01/15  Yes Nayana  Abrol, MD  ascorbic acid (VITAMIN C) 500 MG tablet Take 500 mg by mouth daily.   Yes Historical Provider, MD  diltiazem (CARDIZEM CD) 180 MG 24 hr capsule Take 1 capsule (180 mg total) by mouth daily. 01/16/17  Yes Max Sane, MD  divalproex (DEPAKOTE SPRINKLES) 125 MG capsule Take 125 mg by mouth at bedtime.   Yes Historical Provider, MD  esomeprazole (NEXIUM) 20 MG  capsule Take 20 mg by mouth daily at 12 noon.   Yes Historical Provider, MD  furosemide (LASIX) 20 MG tablet Take 20 mg by mouth daily.     Yes Historical Provider, MD  guaifenesin (ROBITUSSIN) 100 MG/5ML syrup Take 100 mg by mouth 3 (three) times daily as needed for cough.   Yes Historical Provider, MD  ketotifen (ZADITOR) 0.025 % ophthalmic solution Place 1 drop into both eyes daily as needed (for dry eyes).   Yes Historical Provider, MD  levETIRAcetam (KEPPRA) 100 MG/ML solution Take 750 mg by mouth 2 (two) times daily. Take 7.29ml by mouth two times a day for seizure   Yes Historical Provider, MD  mineral oil enema Place 1 enema rectally as needed for severe constipation.   Yes Historical Provider, MD  potassium chloride (KLOR-CON) 10 MEQ CR tablet Take 10 mEq by mouth daily.     Yes Historical Provider, MD  senna-docusate (SENOKOT S) 8.6-50 MG tablet Take 1 tablet by mouth daily. 10/23/16  Yes Rudene Re, MD  simvastatin (ZOCOR) 20 MG tablet Take 20 mg by mouth daily.   Yes Historical Provider, MD  traMADol (ULTRAM) 50 MG tablet Take 1 tablet (50 mg total) by mouth every 6 (six) hours as needed for moderate pain or severe pain. 11/05/16  Yes Lytle Butte, MD  collagenase (SANTYL) ointment Apply 1 application topically every other day. To left heel    Historical Provider, MD  donepezil (ARICEPT) 5 MG tablet Take 1 tablet (5 mg total) by mouth at bedtime. Patient not taking: Reported on 01/13/2017 04/01/15   Reyne Dumas, MD  feeding supplement, ENSURE ENLIVE, (ENSURE ENLIVE) LIQD Take 237 mLs by mouth 2 (two) times daily between meals. 11/05/16   Lytle Butte, MD  levETIRAcetam (KEPPRA) 500 MG tablet Take 500 mg by mouth 2 (two) times daily.     Historical Provider, MD  megestrol (MEGACE) 40 MG tablet Take 1 tablet (40 mg total) by mouth daily. Patient not taking: Reported on 01/13/2017 11/06/16   Lytle Butte, MD  memantine (NAMENDA) 10 MG tablet Take 10 mg by mouth 2 (two) times daily.     Historical Provider, MD  polyethylene glycol (MIRALAX) packet Take 17 g by mouth daily. 04/01/15   Reyne Dumas, MD  QUEtiapine (SEROQUEL) 50 MG tablet Take 1 tablet (50 mg total) by mouth at bedtime. Patient taking differently: Take 25 mg by mouth at bedtime.  04/01/15   Reyne Dumas, MD     Allergies Patient has no known allergies.   Family History  Problem Relation Age of Onset  . Atrial fibrillation Sister     Social History Social History  Substance Use Topics  . Smoking status: Never Smoker  . Smokeless tobacco: Never Used  . Alcohol use No    Review of Systems Unable to reliably obtained due to altered mental status. Noted as provided by son at bedside Respiratory:   Congested breathing. Musculoskeletal:   Right leg swelling due to leg fracture suffered at nursing home 10-point ROS otherwise negative.  ____________________________________________   PHYSICAL  EXAM:  VITAL SIGNS: ED Triage Vitals  Enc Vitals Group     BP 01/21/17 1942 98/82     Pulse Rate 01/21/17 1942 (!) 105     Resp 01/21/17 1942 20     Temp 01/21/17 1942 99.7 F (37.6 C)     Temp Source 01/21/17 1942 Rectal     SpO2 01/21/17 1942 100 %     Weight 01/21/17 1947 232 lb 9.4 oz (105.5 kg)     Height 01/21/17 1947 5' 0.5" (1.537 m)     Head Circumference --      Peak Flow --      Pain Score --      Pain Loc --      Pain Edu? --      Excl. in [place]? --     Vital signs reviewed, nursing assessments reviewed.   Constitutional:   Comatose GCS 7. No respiratory distress Eyes:   No scleral icterus. No conjunctival pallor. PERRL at 2 mm bilaterally.  ENT   Head:   Normocephalic and atraumatic.   Nose:   No congestion/rhinnorhea. No septal hematoma   Mouth/Throat:   MMM, no pharyngeal erythema. No peritonsillar mass.    Neck:   No stridor. No SubQ emphysema.  Hematological/Lymphatic/Immunilogical:   No cervical lymphadenopathy. Cardiovascular:   Tachycardia heart rate 110. Symmetric  bilateral radial and DP pulses.  No murmurs.  Respiratory:   Normal respiratory effort without tachypnea nor retractions. Coarse breath sounds diffusely, diffusely rhonchorous Gastrointestinal:   Soft and nontender. Non distended. There is no CVA tenderness.  No rebound, rigidity, or guarding. Brown stool in diaper Genitourinary:   deferred Musculoskeletal:   Right leg swelling. Wrapped in [place]. Neurologic:   GCS 7, E2V1M4 Skin:    Skin is warm, dry with stage II sacral decubitus ulcer with clean dressing. Not inflamed or draining a purulent. No induration or undermining.. No rash noted.  No petechiae, purpura, or bullae.  ____________________________________________    LABS (pertinent positives/negatives) (all labs ordered are listed, but only abnormal results are displayed) Labs Reviewed  COMPREHENSIVE METABOLIC PANEL - Abnormal; Notable for the following:       Result Value   Potassium 2.6 (*)    BUN <5 (*)    Calcium 8.5 (*)    Total Protein 6.3 (*)    Albumin 2.8 (*)    ALT 11 (*)    All other components within normal limits  TROPONIN I - Abnormal; Notable for the following:    Troponin I 0.09 (*)    All other components within normal limits  CBC WITH DIFFERENTIAL/PLATELET - Abnormal; Notable for the following:    RBC 3.76 (*)    RDW 15.2 (*)    All other components within normal limits  PROTIME-INR - Abnormal; Notable for the following:    Prothrombin Time 18.9 (*)    All other components within normal limits  URINALYSIS, COMPLETE (UACMP) WITH MICROSCOPIC - Abnormal; Notable for the following:    Color, Urine YELLOW (*)    APPearance CLOUDY (*)    Hgb urine dipstick MODERATE (*)    Leukocytes, UA LARGE (*)    Squamous Epithelial / LPF 0-5 (*)    All other components within normal limits  CULTURE, BLOOD (ROUTINE X 2)  CULTURE, BLOOD (ROUTINE X 2)  URINE CULTURE  LACTIC ACID, PLASMA  LIPASE, BLOOD  APTT  BLOOD GAS, VENOUS  INFLUENZA PANEL BY PCR (TYPE A & B)    ____________________________________________  EKG    ____________________________________________    RADIOLOGY  Dg Chest Port 1 View  Result Date: 01/21/2017 CLINICAL DATA:  Crackles, seizure, hypoxia EXAM: PORTABLE CHEST 1 VIEW COMPARISON:  01/15/2017 FINDINGS: There is no focal parenchymal opacity. There is no pleural effusion or pneumothorax. There is stable cardiomegaly. The osseous structures are unremarkable. IMPRESSION: No active disease. Electronically Signed   By: Kathreen Devoid   On: 01/21/2017 19:47    ____________________________________________   PROCEDURES Procedures CRITICAL CARE Performed by: Carrie Mew   Total critical care time: 35 minutes  Critical care time was exclusive of separately billable procedures and treating other patients.  Critical care was necessary to treat or prevent imminent or life-threatening deterioration.  Critical care was time spent personally by me on the following activities: development of treatment plan with patient and/or surrogate as well as nursing, discussions with consultants, evaluation of patient's response to treatment, examination of patient, obtaining history from patient or surrogate, ordering and performing treatments and interventions, ordering and review of laboratory studies, ordering and review of radiographic studies, pulse oximetry and re-evaluation of patient's condition.  ____________________________________________   INITIAL IMPRESSION / ASSESSMENT AND PLAN / ED COURSE  Pertinent labs & imaging results that were available during my care of the patient were reviewed by me and considered in my medical decision making (see chart for details).  Patient presents with low GCS after his seizure. Possibly due to post ictal state versus effects of benzodiazepine given by EMS. On reassessment the ED at 8:15 PM patient's mental status is improved. Still confused, but closer to baseline of dementia.  Suspect  the patient has had ongoing urinary tract infection which is aggravating underlying seizure disorder. With her altered mental status, tachycardia, hypokalemia, we'll plan to hospitalize the patient again for broad spectrum antibiotics. IV potassium for her hypokalemia.     ____________________________________________   FINAL CLINICAL IMPRESSION(S) / ED DIAGNOSES  Final diagnoses:  Seizure ([place])  Glasgow coma scale total score 3-8, at arrival to emergency department [facility])  Hypokalemia  Lower urinary tract infectious disease      New Prescriptions   No medications on file     Portions of this note were generated with dragon dictation software. Dictation errors may occur despite best attempts at proofreading.    Carrie Mew, MD 01/21/17 2051

## 2017-01-21 NOTE — Progress Notes (Signed)
ANTIBIOTIC CONSULT NOTE - INITIAL  Pharmacy Consult for Vancomycin and Zosyn Indication: sepsis  No Known Allergies  Patient Measurements:   Adjusted Body Weight: 68.83  Vital Signs:   Intake/Output from previous day: No intake/output data recorded. Intake/Output from this shift: No intake/output data recorded.  Labs: No results for input(s): WBC, HGB, PLT, LABCREA, CREATININE in the last 72 hours. Estimated Creatinine Clearance: 64.1 mL/min (by C-G formula based on SCr of 0.52 mg/dL). No results for input(s): VANCOTROUGH, VANCOPEAK, VANCORANDOM, GENTTROUGH, GENTPEAK, GENTRANDOM, TOBRATROUGH, TOBRAPEAK, TOBRARND, AMIKACINPEAK, AMIKACINTROU, AMIKACIN in the last 72 hours.   Microbiology: Recent Results (from the past 720 hour(s))  Urine culture     Status: Abnormal   Collection Time: 01/13/17  3:38 PM  Result Value Ref Range Status   Specimen Description URINE, CATHETERIZED  Final   Special Requests NONE  Final   Culture MULTIPLE SPECIES PRESENT, SUGGEST RECOLLECTION (A)  Final   Report Status 01/15/2017 FINAL  Final  Culture, blood (Routine X 2) w Reflex to ID Panel     Status: None   Collection Time: 01/13/17  9:48 PM  Result Value Ref Range Status   Specimen Description BLOOD RIGHT HAND  Final   Special Requests BOTTLES DRAWN AEROBIC AND ANAEROBIC [place]  Final   Culture NO GROWTH 5 DAYS  Final   Report Status 01/18/2017 FINAL  Final  MRSA PCR Screening     Status: None   Collection Time: 01/13/17 10:51 PM  Result Value Ref Range Status   MRSA by PCR NEGATIVE NEGATIVE Final    Comment:        The GeneXpert MRSA Assay (FDA approved for NASAL specimens only), is one component of a comprehensive MRSA colonization surveillance program. It is not intended to diagnose MRSA infection nor to guide or monitor treatment for MRSA infections.   Culture, blood (Routine X 2) w Reflex to ID Panel     Status: None   Collection Time: 01/13/17 11:34 PM  Result Value  Ref Range Status   Specimen Description BLOOD LEFT ANTECUBITAL  Final   Special Requests   Final    BOTTLES DRAWN AEROBIC AND ANAEROBIC ANA [place] AER [place]   Culture NO GROWTH 5 DAYS  Final   Report Status 01/18/2017 FINAL  Final    Medical History: Past Medical History:  Diagnosis Date  . A-fib ([place]) 02/14/2015  . Arthritis   . Atrial fibrillation ([place])   . Colovaginal fistula   . Complication of anesthesia    DIFFICULTY WAKING   . CVA (cerebral infarction) 02/14/2015  . Dementia   . GERD without esophagitis 02/14/2015  . Glucose intolerance (impaired glucose tolerance)   . Gout   . Hyperlipidemia   . Recurrent UTI   . Seizure ([place]) 02/14/2015    Medications:   (Not in a hospital admission) Scheduled:   Assessment: Pharmacy consulted to dose and monitor Vancomycin and Zosyn therapy in this 76 year old female patient arriving from [facility] with altered mental status  Goal of Therapy:  Vancomycin trough level 15-20 mcg/ml  Plan:  Patient has order for one time doses of Vancomycin and Zosyn to be given in ED. Will start Vancomycin 1 g IV q12 hours starting @ 03:00 on 3/6 and Zosyn 3.375 q8 hours starting @ 03:00 as well. Trough level ordered to be drawn @ 1430 on 3/7.    Greyson Peavy D 01/21/2017,7:43 PM

## 2017-01-21 NOTE — ED Triage Notes (Signed)
Pt presents to ED from [facility]. EMS reports they were initiallycalled out for seizure, pt given 4mg  versed IM PTA. Per family pt was last seen normal around 1400, discovered by nursing staff around 1800. Family states pt has some dementia but is normally able to converse with visitors. Pt is minimally responsive upon arrival. Withdraws arms from  IV sticks but does not communicate verbally. Hx dementia, a-fib, currently on Augmentin for UTI.

## 2017-01-21 NOTE — H&P (Signed)
History and Physical   [facility] - [place] @ [facility] Admission History and Physical [employer], D.O.    Patient Name: [name] MR#: [id number] Date of Birth: Aug 13, 1941 Date of Admission: 01/21/2017  Referring MD/NP/PA: Dr. Joni Fears Primary Care Physician: Kirk Ruths., MD Outpatient Specialists: Dr. Marry Guan (ortho), [facility] Patient coming from: [place]  Chief Complaint: AMS/seizure Please note the entire history is obtained from the patient's emergency department chart, emergency department provider and nursing staff. Patient's personal history is limited by dementia and altered mental status.  HPI: [name] is a 76 y.o. female with a known history of afib, OA, CVA, dementia, GERD, HLD, seizure, colovaginal fistula was in a usual state of health until this evening patient was found to be unresponsive. On EMS arrival she was found to having seizure activity which resolved with 4 mg IM Versed.  ED Course: Patient rec'd Versed, Zosyn, Vanco, NS, KCL and is reportedly closer to her baseline. Nursing has been unable to give patient meds due to swallowing difficulty and unable to follow commands  She is reported to have chest congestion over the past 3 days. Of note patient was recently hospitalized from 01/13/17 to 01/16/2017 with diagnosis of sepsis secondary to urinary tract infection, breakthrough seizure, UTI, aspiration pneumonia. She was discharged to [facility] on Augmentin and with a dysphagia 1 diet secondary to aspiration  Otherwise there has been no change in status. Patient has been taking medication as prescribed and there has been no recent change in medication or diet.  No recent antibiotics.  There has been no recent illness, hospitalizations, travel or sick contacts.    Review of Systems:  Unable to obtain secondary to AMS, dementia, postictal   Past Medical History:  Diagnosis Date  . A-fib ([place]) 02/14/2015  . Arthritis    . Atrial fibrillation ([place])   . Colovaginal fistula   . Complication of anesthesia    DIFFICULTY WAKING   . CVA (cerebral infarction) 02/14/2015  . Dementia   . GERD without esophagitis 02/14/2015  . Glucose intolerance (impaired glucose tolerance)   . Gout   . Hyperlipidemia   . Recurrent UTI   . Seizure ([place]) 02/14/2015    Past Surgical History:  Procedure Laterality Date  . BUNIONECTOMY Right   . CHOLECYSTECTOMY    . Right total hip arthroplasty  1998  . Right total hip revision arthroplasty for a periprosthetic fracture  2007     reports that she has never smoked. She has never used smokeless tobacco. She reports that she does not drink alcohol or use drugs.  No Known Allergies  Family History  Problem Relation Age of Onset  . Atrial fibrillation Sister    Prior to Admission medications   Medication Sig Start Date End Date Taking? Authorizing Provider  acetaminophen (TYLENOL) 325 MG tablet Take 2 tablets (650 mg total) by mouth every 6 (six) hours as needed for mild pain (or Fever >/= 101). 11/05/16  Yes Lytle Butte, MD  Amino Acids-Protein Hydrolys (FEEDING SUPPLEMENT, PRO-STAT 64,) LIQD Take 30 mLs by mouth daily.   Yes Historical Provider, MD  amoxicillin-clavulanate (AUGMENTIN) 250-62.5 MG/5ML suspension Take 10 mLs (500 mg total) by mouth 3 (three) times daily. 01/16/17  Yes Vipul Manuella Ghazi, MD  apixaban (ELIQUIS) 5 MG TABS tablet Take 1 tablet (5 mg total) by mouth 2 (two) times daily. 04/01/15  Yes Reyne Dumas, MD  ascorbic acid (VITAMIN C) 500 MG tablet Take 500 mg by mouth daily.  Yes Historical Provider, MD  diltiazem (CARDIZEM CD) 180 MG 24 hr capsule Take 1 capsule (180 mg total) by mouth daily. 01/16/17  Yes Max Sane, MD  divalproex (DEPAKOTE SPRINKLES) 125 MG capsule Take 125 mg by mouth at bedtime.   Yes Historical Provider, MD  esomeprazole (NEXIUM) 20 MG capsule Take 20 mg by mouth daily at 12 noon.   Yes Historical Provider, MD  furosemide (LASIX) 20 MG  tablet Take 20 mg by mouth daily.     Yes Historical Provider, MD  guaifenesin (ROBITUSSIN) 100 MG/5ML syrup Take 100 mg by mouth 3 (three) times daily as needed for cough.   Yes Historical Provider, MD  ketotifen (ZADITOR) 0.025 % ophthalmic solution Place 1 drop into both eyes daily as needed (for dry eyes).   Yes Historical Provider, MD  levETIRAcetam (KEPPRA) 100 MG/ML solution Take 750 mg by mouth 2 (two) times daily. Take 7.27ml by mouth two times a day for seizure   Yes Historical Provider, MD  mineral oil enema Place 1 enema rectally as needed for severe constipation.   Yes Historical Provider, MD  potassium chloride (KLOR-CON) 10 MEQ CR tablet Take 10 mEq by mouth daily.     Yes Historical Provider, MD  senna-docusate (SENOKOT S) 8.6-50 MG tablet Take 1 tablet by mouth daily. 10/23/16  Yes Rudene Re, MD  simvastatin (ZOCOR) 20 MG tablet Take 20 mg by mouth daily.   Yes Historical Provider, MD  traMADol (ULTRAM) 50 MG tablet Take 1 tablet (50 mg total) by mouth every 6 (six) hours as needed for moderate pain or severe pain. 11/05/16  Yes Lytle Butte, MD  collagenase (SANTYL) ointment Apply 1 application topically every other day. To left heel    Historical Provider, MD  donepezil (ARICEPT) 5 MG tablet Take 1 tablet (5 mg total) by mouth at bedtime. Patient not taking: Reported on 01/13/2017 04/01/15   Reyne Dumas, MD  feeding supplement, ENSURE ENLIVE, (ENSURE ENLIVE) LIQD Take 237 mLs by mouth 2 (two) times daily between meals. 11/05/16   Lytle Butte, MD  levETIRAcetam (KEPPRA) 500 MG tablet Take 500 mg by mouth 2 (two) times daily.     Historical Provider, MD  megestrol (MEGACE) 40 MG tablet Take 1 tablet (40 mg total) by mouth daily. Patient not taking: Reported on 01/13/2017 11/06/16   Lytle Butte, MD  memantine (NAMENDA) 10 MG tablet Take 10 mg by mouth 2 (two) times daily.    Historical Provider, MD  polyethylene glycol (MIRALAX) packet Take 17 g by mouth daily. 04/01/15    Reyne Dumas, MD  QUEtiapine (SEROQUEL) 50 MG tablet Take 1 tablet (50 mg total) by mouth at bedtime. Patient taking differently: Take 25 mg by mouth at bedtime.  04/01/15   Reyne Dumas, MD    Physical Exam: Vitals:   01/21/17 1933 01/21/17 1942 01/21/17 1947 01/21/17 2045  BP: (!) 118/102 98/82  (!) 151/87  Pulse: (!) 105 (!) 105  (!) 105  Resp: (!) 30 20  15   Temp:  99.7 F (37.6 C)    TempSrc:  Rectal    SpO2: 100% 100%  100%  Weight:   105.5 kg (232 lb 9.4 oz)   Height:   5' 0.5" (1.537 m)     GENERAL: 76 y.o.-year-old female patient, chronically ill appearing, lying in the bed in no acute distress.  Awake, responds to some questions.  HEENT: Head atraumatic, normocephalic. Pupils equal, round, reactive to light and accommodation. No scleral icterus.  Extraocular muscles intact. Nares are patent. Oropharynx is clear. Mucus membranes moist. NECK: Supple, full range of motion. No JVD, no bruit heard. No thyroid enlargement, no tenderness, no cervical lymphadenopathy. CHEST: Diffuse rhonchi.  No use of accessory muscles of respiration.  No reproducible chest wall tenderness.  CARDIOVASCULAR: S1, S2 normal. No murmurs, rubs, or gallops. Cap refill <2 seconds. Pulses intact distally.  ABDOMEN: Soft, nondistended, nontender. No rebound, guarding, rigidity. Normoactive bowel sounds present in all four quadrants. No organomegaly or mass. EXTREMITIES: Severe upper and lower extremity contractures.  Right lower extremity edema No calf tenderness or Homan's sign.  NEUROLOGIC: The patient is arousable. Cannot comply with full neurologic exam. Answers some questions SKIN: Multiple wounds including stage II sacral decubitus ulcer noted by emergency department physician.    Labs on Admission:  CBC:  Recent Labs Lab 01/15/17 0504 01/21/17 1925  WBC 6.1 5.4  NEUTROABS 3.3 3.1  HGB 11.6* 12.1  HCT 35.3 37.3  MCV 98.2 99.1  PLT 242 [id number]   Basic Metabolic Panel:  Recent Labs Lab  01/15/17 0504 01/21/17 1925  NA 139 138  K 4.6 2.6*  CL 114* 107  CO2 18* 26  GLUCOSE 75 89  BUN <5* <5*  CREATININE 0.52 0.46  CALCIUM 8.2* 8.5*   GFR: Estimated Creatinine Clearance: 65.3 mL/min (by C-G formula based on SCr of 0.46 mg/dL). Liver Function Tests:  Recent Labs Lab 01/21/17 1925  AST 20  ALT 11*  ALKPHOS 90  BILITOT 1.1  PROT 6.3*  ALBUMIN 2.8*    Recent Labs Lab 01/21/17 1925  LIPASE 13   No results for input(s): AMMONIA in the last 168 hours. Coagulation Profile:  Recent Labs Lab 01/21/17 1925  INR 1.57   Cardiac Enzymes:  Recent Labs Lab 01/21/17 1925  TROPONINI 0.09*   BNP (last 3 results) No results for input(s): PROBNP in the last 8760 hours. HbA1C: No results for input(s): HGBA1C in the last 72 hours. CBG: No results for input(s): GLUCAP in the last 168 hours. Lipid Profile: No results for input(s): CHOL, HDL, LDLCALC, TRIG, CHOLHDL, LDLDIRECT in the last 72 hours. Thyroid Function Tests: No results for input(s): TSH, T4TOTAL, FREET4, T3FREE, THYROIDAB in the last 72 hours. Anemia Panel: No results for input(s): VITAMINB12, FOLATE, FERRITIN, TIBC, IRON, RETICCTPCT in the last 72 hours. Urine analysis:    Component Value Date/Time   COLORURINE YELLOW (A) 01/21/2017 1925   APPEARANCEUR CLOUDY (A) 01/21/2017 1925   APPEARANCEUR Clear 03/19/2015 0103   LABSPEC 1.012 01/21/2017 1925   LABSPEC 1.024 03/19/2015 0103   PHURINE 6.0 01/21/2017 1925   GLUCOSEU NEGATIVE 01/21/2017 1925   GLUCOSEU Negative 03/19/2015 0103   HGBUR MODERATE (A) 01/21/2017 1925   BILIRUBINUR NEGATIVE 01/21/2017 1925   BILIRUBINUR Negative 03/19/2015 0103   KETONESUR NEGATIVE 01/21/2017 1925   PROTEINUR NEGATIVE 01/21/2017 1925   UROBILINOGEN 2.0 (H) 03/29/2015 0141   NITRITE NEGATIVE 01/21/2017 1925   LEUKOCYTESUR LARGE (A) 01/21/2017 1925   LEUKOCYTESUR Negative 03/19/2015 0103   Sepsis Labs: @LABRCNTIP (procalcitonin:4,lacticidven:4) ) Recent  Results (from the past 240 hour(s))  Urine culture     Status: Abnormal   Collection Time: 01/13/17  3:38 PM  Result Value Ref Range Status   Specimen Description URINE, CATHETERIZED  Final   Special Requests NONE  Final   Culture MULTIPLE SPECIES PRESENT, SUGGEST RECOLLECTION (A)  Final   Report Status 01/15/2017 FINAL  Final  Culture, blood (Routine X 2) w Reflex to ID Panel  Status: None   Collection Time: 01/13/17  9:48 PM  Result Value Ref Range Status   Specimen Description BLOOD RIGHT HAND  Final   Special Requests BOTTLES DRAWN AEROBIC AND ANAEROBIC [place]  Final   Culture NO GROWTH 5 DAYS  Final   Report Status 01/18/2017 FINAL  Final  MRSA PCR Screening     Status: None   Collection Time: 01/13/17 10:51 PM  Result Value Ref Range Status   MRSA by PCR NEGATIVE NEGATIVE Final    Comment:        The GeneXpert MRSA Assay (FDA approved for NASAL specimens only), is one component of a comprehensive MRSA colonization surveillance program. It is not intended to diagnose MRSA infection nor to guide or monitor treatment for MRSA infections.   Culture, blood (Routine X 2) w Reflex to ID Panel     Status: None   Collection Time: 01/13/17 11:34 PM  Result Value Ref Range Status   Specimen Description BLOOD LEFT ANTECUBITAL  Final   Special Requests   Final    BOTTLES DRAWN AEROBIC AND ANAEROBIC ANA [place] AER [place]   Culture NO GROWTH 5 DAYS  Final   Report Status 01/18/2017 FINAL  Final     Radiological Exams on Admission: Dg Chest Port 1 View  Result Date: 01/21/2017 CLINICAL DATA:  Crackles, seizure, hypoxia EXAM: PORTABLE CHEST 1 VIEW COMPARISON:  01/15/2017 FINDINGS: There is no focal parenchymal opacity. There is no pleural effusion or pneumothorax. There is stable cardiomegaly. The osseous structures are unremarkable. IMPRESSION: No active disease. Electronically Signed   By: Kathreen Devoid   On: 01/21/2017 19:47   EKG: afib at 108bpm.  Assessment/Plan  This is  a 76 y.o. female with a history of afib, OA, CVA, dementia, GERD, HLD, seizure, colovaginal fistula now being admitted with:  1. AMS Likely secondary to break through seizure. Also concern for recurrent aspiration pneumonia given lung exam, tachycardia - Admit inpatient -Neurochecks -Seizure/aspiration precautions - Continue Keppra, Depakote -Follow-up blood and sputum cultures - Neurology consult.  - Consider head CT. - NPO for swallow eval  2. UTI, not meeting sepsis criteria - Zosyn Vanco - IVFs -Follow up cultures  3. Hypokalemia - Replace IV  4. Elevated troponin, chronic - Monitor on tele - Trend trops - Check lipids - Add Aspirin  5. History of afib - Continue Eliquis, cardizem  6. 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 History of GERD - Continue Nexium  8. History of HLD - Continue Zocor  9. History of OA - Continue Ultram, Tylenol  10. History of dementia - Continue Aricept, Namenda, Seroquel  11. History of chronic rectovaginal fistula -No surgical intervention planned to do patient's overall poor health and prognosis  12. Left heel wound and chronic sacral acuity stage II both present at time of admission -Wound care consult -Continue Santyl -Frequent repositioning -No surgical intervention planned secondary to patient's overall poor health and prognosis  Admission status: Inpatient IV Fluids: NS Diet/Nutrition: NPO pending speech and swallow.  When able: Heart healthy. Nutritional supplements, Dysphasia 1, crush pills in pure with feeding assistance. Consults called: PT, Speech, Neurology  DVT Px: Eliquis, SCDs. Code Status: DO NOT RESUSCITATE (comes with nonhospital DNR form) Disposition Plan: To [facility] in 2-3 days   All the records are reviewed and case discussed with ED provider. Management plans discussed with the patient and/or family who express understanding and agree with plan of  care.  Loda Bialas D.O. on 01/21/2017 at 9:18 PM Between 7am to 6pm - Pager - [phone] After 6pm go to [email or web address] - Proofreader [facility] Office [phone] CC: Primary care physician; Kirk Ruths., MD   01/21/2017, 9:18 PM

## 2017-01-22 ENCOUNTER — Ambulatory Visit: Payer: Medicare Other | Admitting: Internal Medicine

## 2017-01-22 DIAGNOSIS — R569 Unspecified convulsions: Principal | ICD-10-CM

## 2017-01-22 LAB — BASIC METABOLIC PANEL
Anion gap: 7 (ref 5–15)
BUN: <5 mg/dL — ABNORMAL LOW (ref 6–20)
CALCIUM: 8 mg/dL — AB (ref 8.9–10.3)
CHLORIDE: 112 mmol/L — AB (ref 101–111)
CO2: 22 mmol/L (ref 22–32)
CREATININE: 0.38 mg/dL — AB (ref 0.44–1.00)
GFR calc non Af Amer: >60 mL/min (ref >60)
GLUCOSE: 92 mg/dL (ref 65–99)
Potassium: 3.2 mmol/L — ABNORMAL LOW (ref 3.5–5.1)
Sodium: 141 mmol/L (ref 135–145)

## 2017-01-22 LAB — CBC
HCT: 34.3 % — ABNORMAL LOW (ref 35.0–47.0)
HEMOGLOBIN: 11 g/dL — AB (ref 12.0–16.0)
MCH: 31.3 pg (ref 26.0–34.0)
MCHC: 32 g/dL (ref 32.0–36.0)
MCV: 97.9 fL (ref 80.0–100.0)
PLATELETS: 200 10*3/uL (ref 150–440)
RBC: 3.5 MIL/uL — AB (ref 3.80–5.20)
RDW: 15.2 % — ABNORMAL HIGH (ref 11.5–14.5)
WBC: 5.2 10*3/uL (ref 3.6–11.0)

## 2017-01-22 LAB — TSH: TSH: 1.415 u[IU]/mL (ref 0.350–4.500)

## 2017-01-22 LAB — MRSA PCR SCREENING: MRSA BY PCR: NEGATIVE

## 2017-01-22 LAB — PHOSPHORUS: Phosphorus: 2.6 mg/dL (ref 2.5–4.6)

## 2017-01-22 LAB — TROPONIN I
TROPONIN I: 0.09 ng/mL — AB (ref <0.03)
Troponin I: 0.09 ng/mL (ref <0.03)
Troponin I: 0.09 ng/mL (ref <0.03)

## 2017-01-22 LAB — MAGNESIUM: MAGNESIUM: 1.4 mg/dL — AB (ref 1.7–2.4)

## 2017-01-22 MED ORDER — SODIUM CHLORIDE 0.9 % IV SOLN
30.0000 meq | Freq: Once | INTRAVENOUS | Status: AC
  Administered 2017-01-22: 30 meq via INTRAVENOUS
  Filled 2017-01-22: qty 15

## 2017-01-22 MED ORDER — ATORVASTATIN CALCIUM 10 MG PO TABS: 10.0000 mg | ORAL_TABLET | Freq: Every day | ORAL | Status: DC

## 2017-01-22 MED ORDER — SODIUM CHLORIDE 0.9 % IV SOLN
500.0000 mg | Freq: Two times a day (BID) | INTRAVENOUS | Status: DC
  Administered 2017-01-22: 500 mg via INTRAVENOUS
  Filled 2017-01-22 (×3): qty 5

## 2017-01-22 MED ORDER — LORAZEPAM BOLUS VIA INFUSION: 1.0000 mg | INTRAVENOUS | Status: DC | PRN

## 2017-01-22 MED ORDER — RISAQUAD PO CAPS: 1.0000 | ORAL_CAPSULE | Freq: Every day | ORAL | Status: DC

## 2017-01-22 MED ORDER — SODIUM CHLORIDE 0.9 % IV SOLN
1000.0000 mg | Freq: Two times a day (BID) | INTRAVENOUS | Status: DC
  Administered 2017-01-22 – 2017-01-23 (×2): 1000 mg via INTRAVENOUS
  Filled 2017-01-22 (×4): qty 10

## 2017-01-22 MED ORDER — LORAZEPAM 2 MG/ML IJ SOLN: 1.0000 mg | INTRAMUSCULAR | Status: DC | PRN

## 2017-01-22 MED ORDER — ASPIRIN 325 MG PO TABS: 325.0000 mg | ORAL_TABLET | Freq: Once | ORAL | Status: DC

## 2017-01-22 MED ORDER — VALPROATE SODIUM 500 MG/5ML IV SOLN
125.0000 mg | Freq: Two times a day (BID) | INTRAVENOUS | Status: DC
  Administered 2017-01-22 – 2017-01-23 (×3): 125 mg via INTRAVENOUS
  Filled 2017-01-22 (×4): qty 1.25

## 2017-01-22 MED ORDER — DEXTROSE 5 % IV SOLN
1.0000 g | Freq: Every day | INTRAVENOUS | Status: DC
  Administered 2017-01-22 – 2017-01-23 (×2): 1 g via INTRAVENOUS
  Filled 2017-01-22 (×2): qty 10

## 2017-01-22 MED ORDER — VALPROATE SODIUM 500 MG/5ML IV SOLN
125.0000 mg | Freq: Every day | INTRAVENOUS | Status: DC
  Administered 2017-01-22: 125 mg via INTRAVENOUS
  Filled 2017-01-22 (×2): qty 1.25

## 2017-01-22 MED ORDER — DEXTROSE 5 % IV SOLN: 1.0000 g | INTRAVENOUS | Status: DC

## 2017-01-22 NOTE — Clinical Social Work Note (Signed)
Clinical Social Work Assessment  Patient Details  Name: [name] MRN: [id number] Date of Birth: [birth year]  Date of referral:  01/22/17               Reason for consult:  Other (Comment Required) (From [facility] LTC )                Permission sought to share information with:  Chartered certified accountant granted to share information::  Yes, Verbal Permission Granted  Name::      [place]::   [place]   Relationship::     Contact Information:     Housing/Transportation Living arrangements for the past 2 months:  [place] of Information:  [place], Adult Children Patient Interpreter Needed:  None Criminal Activity/Legal Involvement Pertinent to Current Situation/Hospitalization:  No - Comment as needed Significant Relationships:  Adult Children Lives with:  Facility Resident Do you feel safe going back to the place where you live?    Need for family participation in patient care:  Yes (Comment)  Care giving concerns: Patient is a long term care resident at [facility] followed by [facility] hosp[facility].    Social Worker assessment / plan: Holiday representative (CSW) reviewed chart and noted that patient is from [employer] [facility]. Per [facility] admissions coordinator at [facility] patient is a long term care resident followed by hospice and can return when stable. CSW attempted to meet with patient however she was asleep. CSW contacted patient's son [name] to complete assessment. Per son he is HPOA and patient recently started receiving services from [facility] hospice. Son reported that patient has been at [facility] for 18 months. Son is agreeable for patient to return to [facility] however he requested CSW to send referral to [facility]. CSW explained to son that a referral can be sent however [facility] will likely not have a long term care medicaid bed. Son  verbalized his understanding. CSW will continue to follow and assist as needed.      Employment status:  Retired Forensic scientist:  Information systems manager, Medicaid In [place] PT Recommendations:  Not assessed at this time [place] / Referral to community resources:  [place]  Patient/Family's Response to care:  Son is agreeable for patient to return to [facility] with hospice.   Patient/Family's Understanding of and Emotional Response to Diagnosis, Current Treatment, and Prognosis:  Son was pleasant and thanked CSW for calling.   Emotional Assessment Appearance:  Appears stated age Attitude/Demeanor/Rapport:  Unable to Assess Affect (typically observed):  Unable to Assess Orientation:  Oriented to Self, Fluctuating Orientation (Suspected and/or reported Sundowners) Alcohol / Substance use:  Not Applicable Psych involvement (Current and /or in the community):  No (Comment)  Discharge Needs  Concerns to be addressed:  Discharge Planning Concerns Readmission within the last 30 days:  No Current discharge risk:  Dependent with Mobility, Cognitively Impaired, Chronically ill Barriers to Discharge:  Continued Medical Work up   [employer], Veronia Beets, LCSW 01/22/2017, 11:40 AM

## 2017-01-22 NOTE — Progress Notes (Signed)
PT Cancellation Note  Patient Details Name: [name] MRN: [id number] DOB: [birth year]   Cancelled Treatment:    Reason Eval/Treat Not Completed: Fatigue/lethargy limiting ability to participate.  Will try later as time allows but was not able to be given meds by RN nor could she receive other therapy this AM.   [name] 01/22/2017, 1:06 PM   [name], PT MS Acute Rehab Dept. Number: [place] and [place]

## 2017-01-22 NOTE — Progress Notes (Signed)
[place] at [place] NAME: [name]    MR#:  [id number]  DATE OF BIRTH:  01/21/1941  SUBJECTIVE:   Came in with altered mental status and breakthru seizures witnessed by EMS. No family opens eyes to sternal rub  REVIEW OF SYSTEMS:   Review of Systems  Unable to perform ROS: Patient unresponsive  Neurological: Positive for weakness.   Tolerating Diet:no Tolerating PT: no  DRUG ALLERGIES:  No Known Allergies  VITALS:  Blood pressure 98/62, pulse 88, temperature 98.3 F (36.8 C), temperature source Oral, resp. rate 16, height 5' 0.5" (1.537 m), weight 105.5 kg (232 lb 9.4 oz), SpO2 100 %.  PHYSICAL EXAMINATION:   Physical Exam  GENERAL:  76 y.o.-year-old patient lying in the bed with no acute distress. Chronically ill EYES: Pupils equal, round, reactive to light and accommodation. No scleral icterus. Extraocular muscles intact.  HEENT: Head atraumatic, normocephalic. Oropharynx and nasopharynx clear.  NECK:  Supple, no jugular venous distention. No thyroid enlargement, no tenderness.  LUNGS: Normal breath sounds bilaterally, no wheezing, rales, rhonchi. No use of accessory muscles of respiration.  CARDIOVASCULAR: S1, S2 normal. No murmurs, rubs, or gallops.  ABDOMEN: Soft, nontender, nondistended. Bowel sounds present. No organomegaly or mass.  EXTREMITIES: No cyanosis, clubbing or edema b/l.    NEUROLOGIC:non verbal, opens eyes to [facility] PSYCHIATRIC:  patient is lethargic SKIN:chronic decubitus and LE ulcers+  LABORATORY PANEL:  CBC  Recent Labs Lab 01/22/17 0400  WBC 5.2  HGB 11.0*  HCT 34.3*  PLT 200    Chemistries   Recent Labs Lab 01/21/17 1925 01/21/17 2350 01/22/17 0400  NA 138  --  141  K 2.6*  --  3.2*  CL 107  --  112*  CO2 26  --  22  GLUCOSE 89  --  92  BUN <5*  --  <5*  CREATININE 0.46  --  0.38*  CALCIUM 8.5*  --  8.0*  MG  --  1.4*  --   AST 20  --   --   ALT 11*  --   --   ALKPHOS  90  --   --   BILITOT 1.1  --   --    Cardiac Enzymes  Recent Labs Lab 01/22/17 1155  TROPONINI 0.09*   RADIOLOGY:  Dg Chest Port 1 View  Result Date: 01/21/2017 CLINICAL DATA:  Crackles, seizure, hypoxia EXAM: PORTABLE CHEST 1 VIEW COMPARISON:  01/15/2017 FINDINGS: There is no focal parenchymal opacity. There is no pleural effusion or pneumothorax. There is stable cardiomegaly. The osseous structures are unremarkable. IMPRESSION: No active disease. Electronically Signed   By: Kathreen Devoid   On: 01/21/2017 19:47   ASSESSMENT AND PLAN:   76 y.o. female with a history of afib, OA, CVA, dementia, GERD, HLD, seizure, colovaginal fistula now being admitted with:  1. AMS Likely secondary to break through seizure. -Seizure/aspiration precautions - Continue Keppra, Depakote increased dose by Neuroloy -Follow-up blood and sputum cultures - NPO for swallow eval  2. UTI, not meeting sepsis criteria - IV rocephin - IVFs -Follow up cultures  3. Hypokalemia - Replace IV  4. Elevated troponin, chronic - Monitor on tele - Trend trops - Add Aspirin  5. History of afib - Continue Eliquis, cardizem  6. 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 History of GERD - Continue Nexium  8. History of HLD - Continue Zocor  9. History of  OA - Continue Ultram, Tylenol  10. History of dementia, severe - Continue Aricept, Namenda, Seroquel  11. History of chronic rectovaginal fistula -No surgical intervention planned to do patient's overall poor health and prognosis  12. Left heel wound and chronic sacral acuity stage II both present at time of admission -Wound care consult -Continue Santyl -Frequent repositioning -No surgical intervention planned secondary to patient's overall poor health and prognosis   Pt overall has a poor prognosis. 3rd admission in 6 months Palliative care consult for goals of care  Case discussed with  Care Management/Social Worker. Management plans discussed with the patient, family and they are in agreement.  CODE STATUS: DNR  DVT Prophylaxis: eliquis TOTAL TIME TAKING CARE OF THIS PATIENT: 25 minutes.  >50% time spent on counselling and coordination of care  POSSIBLE D/C IN 2-3 DAYS, DEPENDING ON CLINICAL CONDITION.  Note: This dictation was prepared with Dragon dictation along with smaller phrase technology. Any transcriptional errors that result from this process are unintentional.  Ashana Tullo M.D on 01/22/2017 at 5:09 PM  Between 7am to 6pm - Pager - [phone]  After 6pm go to [email or web address] - password [place] [facility]  Office  [phone]  CC: Primary care physician; Kirk Ruths., MD

## 2017-01-22 NOTE — NC FL2 (Signed)
[place] LEVEL OF CARE SCREENING TOOL     IDENTIFICATION  Patient Name: [name] Birthdate: May 25, 1941 Sex: female Admission Date (Current Location): 01/21/2017  [facility] and [place] Number:  Selena Lesser  ([id number]) Facility and Address:  [facility], [address], [place], [place] [address]      Provider Number: [id number]  Attending Physician Name and Address:  Fritzi Mandes, MD  Relative Name and Phone Number:       Current Level of Care: Hospital Recommended Level of Care: [place] Prior Approval Number:    Date Approved/Denied:   PASRR Number:  ([id number] )  Discharge Plan: SNF    Current Diagnoses: Patient Active Problem List   Diagnosis Date Noted  . Altered mental status 01/21/2017  . Palliative care encounter   . Goals of care, counseling/discussion   . DNR (do not resuscitate) discussion   . Sepsis ([place]) 10/31/2016  . Pressure injury of skin 10/31/2016  . Allergic state 10/05/2016  . Arthritis 10/05/2016  . Glucose intolerance (impaired glucose tolerance) 10/05/2016  . History of gout 10/05/2016  . Hyperlipidemia, unspecified 10/05/2016  . Stroke ([place]) 10/05/2016  . Delirium due to another medical condition 07/09/2016  . 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 Fracture, tibial plateau 04/01/2015  . Fall   . Knee fracture, left 03/29/2015  . UTI (urinary tract infection) 03/29/2015  . Mental status, decreased 02/14/2015  . A-fib ([place]) 02/14/2015  . Cerebral infarction ([place]) 02/14/2015  . GERD (gastroesophageal reflux disease) 02/14/2015  . Seizure ([place]) 02/14/2015  . Dementia 02/14/2015  . GERD without esophagitis 02/14/2015  . History of CVA (cerebrovascular accident)   . Long term current use of anticoagulant therapy 08/08/2011    Orientation RESPIRATION BLADDER Height & Weight     Self  Normal Continent Weight: 232 lb 9.4 oz (105.5 kg) Height:  5' 0.5" (153.7 cm)  BEHAVIORAL  SYMPTOMS/MOOD NEUROLOGICAL BOWEL NUTRITION STATUS   (none ) Convulsions/Seizures (history of seizures ) Incontinent Diet (NPO to be advanced )  AMBULATORY STATUS COMMUNICATION OF NEEDS Skin   Total Care Verbally PU Stage and Appropriate Care (Pressure Ulcers on sacrum, legs, toes, and heels  )                       Personal Care Assistance Level of Assistance  Bathing, Feeding, Dressing, Total care Bathing Assistance: Maximum assistance Feeding assistance: Maximum assistance Dressing Assistance: Maximum assistance Total Care Assistance: Maximum assistance   Functional Limitations Info  Sight, Hearing, Speech Sight Info: Adequate Hearing Info: Adequate Speech Info: Adequate    SPECIAL CARE FACTORS FREQUENCY   (Followed by [facility]. )                    Contractures      Additional Factors Info  Code Status, Allergies Code Status Info:  (DNR ) Allergies Info:  (No Known Allergies. )           Current Medications (01/22/2017):  This is the current hospital active medication list Current Facility-Administered Medications  Medication Dose Route Frequency Provider Last Rate Last Dose  . 0.9 %  sodium chloride infusion   Intravenous Continuous Alexis Hugelmeyer, DO 75 mL/hr at 01/21/17 2341    . acetaminophen (TYLENOL) tablet 650 mg  650 mg Oral Q6H PRN Alexis Hugelmeyer, DO       Or  . acetaminophen (TYLENOL) suppository 650 mg  650 mg Rectal Q6H PRN Alexis Hugelmeyer, DO      .  acidophilus (RISAQUAD) capsule 1 capsule  1 capsule Oral Daily Alexis Hugelmeyer, DO      . albuterol (PROVENTIL) (2.5 MG/3ML) 0.083% nebulizer solution 2.5 mg  2.5 mg Nebulization Q6H PRN Alexis Hugelmeyer, DO      . apixaban (ELIQUIS) tablet 5 mg  5 mg Oral BID Alexis Hugelmeyer, DO      . [START ON 01/23/2017] aspirin EC tablet 81 mg  81 mg Oral Daily Alexis Hugelmeyer, DO      . aspirin tablet 325 mg  325 mg Oral Once [employer], DO      . atorvastatin (LIPITOR)  tablet 10 mg  10 mg Oral Daily Alexis Hugelmeyer, DO      . bisacodyl (DULCOLAX) EC tablet 5 mg  5 mg Oral Daily PRN Alexis Hugelmeyer, DO      . collagenase (SANTYL) ointment 1 application  1 application Topical QODAY Alexis Hugelmeyer, DO      . diltiazem (CARDIZEM CD) 24 hr capsule 180 mg  180 mg Oral Daily Alexis Hugelmeyer, DO      . feeding supplement (ENSURE ENLIVE) (ENSURE ENLIVE) liquid 237 mL  237 mL Oral BID BM Alexis Hugelmeyer, DO      . feeding supplement (PRO-STAT SUGAR FREE 64) liquid 30 mL  30 mL Oral Daily Alexis Hugelmeyer, DO      . furosemide (LASIX) tablet 20 mg  20 mg Oral Daily Alexis Hugelmeyer, DO      . guaifenesin (ROBITUSSIN) 100 MG/5ML syrup 100 mg  100 mg Oral TID PRN Alexis Hugelmeyer, DO      . ipratropium (ATROVENT) nebulizer solution 0.5 mg  0.5 mg Nebulization Q6H PRN Alexis Hugelmeyer, DO      . ketotifen (ZADITOR) 0.025 % ophthalmic solution 1 drop  1 drop Both Eyes Daily PRN Alexis Hugelmeyer, DO      . levETIRAcetam (KEPPRA) 500 mg in sodium chloride 0.9 % 100 mL IVPB  500 mg Intravenous Q12H Alexis Hugelmeyer, DO   500 mg at 01/22/17 0335  . LORazepam (ATIVAN) injection 1 mg  1 mg Intravenous PRN Alexis Hugelmeyer, DO      . magnesium citrate solution 1 Bottle  1 Bottle Oral Once PRN Alexis Hugelmeyer, DO      . memantine (NAMENDA) tablet 10 mg  10 mg Oral BID Alexis Hugelmeyer, DO      . ondansetron (ZOFRAN) tablet 4 mg  4 mg Oral Q6H PRN Alexis Hugelmeyer, DO       Or  . ondansetron (ZOFRAN) injection 4 mg  4 mg Intravenous Q6H PRN Alexis Hugelmeyer, DO      . oxyCODONE (Oxy IR/ROXICODONE) immediate release tablet 5 mg  5 mg Oral Q4H PRN Alexis Hugelmeyer, DO      . pantoprazole (PROTONIX) EC tablet 40 mg  40 mg Oral Daily Alexis Hugelmeyer, DO      . piperacillin-tazobactam (ZOSYN) IVPB 3.375 g  3.375 g Intravenous Q8H Carrie Mew, MD 12.5 mL/hr at 01/22/17 1121 3.375 g at 01/22/17 1121  . polyethylene glycol (MIRALAX / GLYCOLAX) packet 17 g  17 g  Oral Daily Alexis Hugelmeyer, DO      . QUEtiapine (SEROQUEL) tablet 50 mg  50 mg Oral QHS Alexis Hugelmeyer, DO      . senna-docusate (Senokot-S) tablet 1 tablet  1 tablet Oral Daily Alexis Hugelmeyer, DO      . senna-docusate (Senokot-S) tablet 1 tablet  1 tablet Oral QHS PRN Alexis Hugelmeyer, DO      . sodium chloride flush (NS)  0.9 % injection 3 mL  3 mL Intravenous Q12H Alexis Hugelmeyer, DO      . traMADol (ULTRAM) tablet 50 mg  50 mg Oral Q6H PRN Alexis Hugelmeyer, DO      . valproate (DEPACON) 125 mg in dextrose 5 % 50 mL IVPB  125 mg Intravenous QHS Alexis Hugelmeyer, DO   125 mg at 01/22/17 0443  . vancomycin (VANCOCIN) IVPB 1000 mg/200 mL premix  1,000 mg Intravenous Q12H Carrie Mew, MD   1,000 mg at 01/22/17 0335  . vitamin C (ASCORBIC ACID) tablet 500 mg  500 mg Oral Daily Alexis Hugelmeyer, DO         Discharge Medications: Please see discharge summary for a list of discharge medications.  Relevant Imaging Results:  Relevant Lab Results:   Additional Information  (SSN: [id number])  Damon Baisch, Veronia Beets, LCSW

## 2017-01-22 NOTE — Progress Notes (Signed)
Patient is responsive to voice but very lethargic. Unable to give PO meds at this time. MD notified. Mouth care provided at this time.

## 2017-01-22 NOTE — Consult Note (Signed)
Reason for Consult:Seizure Referring Physician: Posey Pronto  CC: Seizure  HPI: [name] is an 76 y.o. female with a history of seizure who is unable to provide history.  No family available.  All history obtained from the chart.  Patient with a known history of afib, OA, CVA, dementia, GERD, HLD, seizure, colovaginal fistula was in a usual state of health until this evening patient was found to be unresponsive. On EMS arrival she was found to having seizure activity which resolved with 4 mg IM Versed.  In ED patient rec'd Versed, Zosyn, Vanco, NS, KCL and is reportedly closer to her baseline. Nursing was unable to give patient meds due to swallowing difficulty and unable to follow commands  She is reported to have chest congestion over the past 3 days. Of note patient was recently hospitalized from 01/13/17 to 01/16/2017 with diagnosis of sepsis secondary to urinary tract infection, breakthrough seizure, UTI, aspiration pneumonia. She was discharged to [facility] on Augmentin and with a dysphagia 1 diet secondary to aspiration  Past Medical History:  Diagnosis Date  . A-fib ([place]) 02/14/2015  . Arthritis   . Atrial fibrillation ([place])   . Colovaginal fistula   . Complication of anesthesia    DIFFICULTY WAKING   . CVA (cerebral infarction) 02/14/2015  . Dementia   . GERD without esophagitis 02/14/2015  . Glucose intolerance (impaired glucose tolerance)   . Gout   . Hyperlipidemia   . Recurrent UTI   . Seizure ([place]) 02/14/2015    Past Surgical History:  Procedure Laterality Date  . BUNIONECTOMY Right   . CHOLECYSTECTOMY    . Right total hip arthroplasty  1998  . Right total hip revision arthroplasty for a periprosthetic fracture  2007    Family History  Problem Relation Age of Onset  . Atrial fibrillation Sister     Social History:  reports that she has never smoked. She has never used smokeless tobacco. She reports that she does not drink alcohol or use drugs.  No Known  Allergies  Medications:  I have reviewed the patient's current medications. Prior to Admission:  Prescriptions Prior to Admission  Medication Sig Dispense Refill Last Dose  . acetaminophen (TYLENOL) 325 MG tablet Take 2 tablets (650 mg total) by mouth every 6 (six) hours as needed for mild pain (or Fever >/= 101). 30 tablet 0 unknown at unknown  . Amino Acids-Protein Hydrolys (FEEDING SUPPLEMENT, PRO-STAT 64,) LIQD Take 30 mLs by mouth daily.   unknown at unknown  . amoxicillin-clavulanate (AUGMENTIN) 250-62.5 MG/5ML suspension Take 10 mLs (500 mg total) by mouth 3 (three) times daily. 150 mL 0 unknown at unknown  . apixaban (ELIQUIS) 5 MG TABS tablet Take 1 tablet (5 mg total) by mouth 2 (two) times daily. 60 tablet 5 01/21/2017 at Unknown time  . ascorbic acid (VITAMIN C) 500 MG tablet Take 500 mg by mouth daily.   unknown at unknown  . diltiazem (CARDIZEM CD) 180 MG 24 hr capsule Take 1 capsule (180 mg total) by mouth daily. 30 capsule 0 unknown at unknown  . divalproex (DEPAKOTE SPRINKLES) 125 MG capsule Take 125 mg by mouth at bedtime.   unknown at unknown  . esomeprazole (NEXIUM) 20 MG capsule Take 20 mg by mouth daily at 12 noon.   unknown at unknown  . furosemide (LASIX) 20 MG tablet Take 20 mg by mouth daily.     unknown at unknown  . guaifenesin (ROBITUSSIN) 100 MG/5ML syrup Take 100 mg by mouth 3 (  three) times daily as needed for cough.   unknown at unknown  . ketotifen (ZADITOR) 0.025 % ophthalmic solution Place 1 drop into both eyes daily as needed (for dry eyes).   unknown at unknown  . levETIRAcetam (KEPPRA) 100 MG/ML solution Take 750 mg by mouth 2 (two) times daily. Take 7.55ml by mouth two times a day for seizure   unknown at unknown  . mineral oil enema Place 1 enema rectally as needed for severe constipation.   unknown at unknown  . potassium chloride (KLOR-CON) 10 MEQ CR tablet Take 10 mEq by mouth daily.     unknown at unknown  . senna-docusate (SENOKOT S) 8.6-50 MG tablet  Take 1 tablet by mouth daily. 30 tablet 1 unknown at unknown  . simvastatin (ZOCOR) 20 MG tablet Take 20 mg by mouth daily.   unknown at unknown  . traMADol (ULTRAM) 50 MG tablet Take 1 tablet (50 mg total) by mouth every 6 (six) hours as needed for moderate pain or severe pain. 30 tablet 0 unknown at unknown  . collagenase (SANTYL) ointment Apply 1 application topically every other day. To left heel   unknown at unknown  . donepezil (ARICEPT) 5 MG tablet Take 1 tablet (5 mg total) by mouth at bedtime. (Patient not taking: Reported on 01/13/2017) 30 tablet 0 Not Taking at Unknown time  . feeding supplement, ENSURE ENLIVE, (ENSURE ENLIVE) LIQD Take 237 mLs by mouth 2 (two) times daily between meals. 237 mL 12   . levETIRAcetam (KEPPRA) 500 MG tablet Take 500 mg by mouth 2 (two) times daily.    Not Taking at Unknown time  . megestrol (MEGACE) 40 MG tablet Take 1 tablet (40 mg total) by mouth daily. (Patient not taking: Reported on 01/13/2017) 30 tablet 0 Not Taking at Unknown time  . memantine (NAMENDA) 10 MG tablet Take 10 mg by mouth 2 (two) times daily.   Not Taking at Unknown time  . polyethylene glycol (MIRALAX) packet Take 17 g by mouth daily. 14 each 0 prn at prn  . QUEtiapine (SEROQUEL) 50 MG tablet Take 1 tablet (50 mg total) by mouth at bedtime. (Patient taking differently: Take 25 mg by mouth at bedtime. ) 30 tablet 2 01/12/2017 at qhs   Scheduled: . acidophilus  1 capsule Oral Daily  . apixaban  5 mg Oral BID  . [START ON 01/23/2017] aspirin EC  81 mg Oral Daily  . aspirin  325 mg Oral Once  . atorvastatin  10 mg Oral Daily  . collagenase  1 application Topical QODAY  . diltiazem  180 mg Oral Daily  . feeding supplement (ENSURE ENLIVE)  237 mL Oral BID BM  . feeding supplement (PRO-STAT SUGAR FREE 64)  30 mL Oral Daily  . furosemide  20 mg Oral Daily  . levETIRAcetam  500 mg Intravenous Q12H  . memantine  10 mg Oral BID  . pantoprazole  40 mg Oral Daily  . piperacillin-tazobactam  (ZOSYN)  IV  3.375 g Intravenous Q8H  . polyethylene glycol  17 g Oral Daily  . QUEtiapine  50 mg Oral QHS  . senna-docusate  1 tablet Oral Daily  . sodium chloride flush  3 mL Intravenous Q12H  . valproate sodium  125 mg Intravenous QHS  . vancomycin  1,000 mg Intravenous Q12H  . ascorbic acid  500 mg Oral Daily    ROS: Patient unable to provide  Physical Examination: Blood pressure 98/62, pulse 88, temperature 98.3 F (36.8 C), temperature source  Oral, resp. rate 16, height 5' 0.5" (1.537 m), weight 105.5 kg (232 lb 9.4 oz), SpO2 100 %.  HEENT-  Normocephalic, no lesions, without obvious abnormality.  Normal external eye and conjunctiva.  Normal TM's bilaterally.  Normal auditory canals and external ears. Normal external nose, mucus membranes and septum.  Normal pharynx. Cardiovascular- S1, S2 normal, pulses palpable throughout   Lungs- chest clear, no wheezing, rales, normal symmetric air entry Abdomen- soft, non-tender; bowel sounds normal; no masses,  no organomegaly Extremities- LE edema Lymph-no adenopathy palpable Musculoskeletal-no joint tenderness, deformity or swelling Skin-warm and dry, no hyperpigmentation, vitiligo, or suspicious lesions  Neurological Examination   Mental Status: Lethargic.  Does not follow commands.  Does respond when I say good morning but no other verbalizations noted.   Cranial Nerves: II: Discs flat bilaterally; Blinks to bilateral confrontation, pupils equal, round, reactive to light and accommodation III,IV, VI: ptosis not present, oculocephalic maneuvers intact V,VII: left facial droop VIII: grossly intact IX,X: unable to test XI: unable to test XII: unable to test Motor: Increased tone throughout with some contracture noted of the LUE.  Both arms in decerebrate position.  Moves LUE spontaneously.  No movement noted of the RUE Sensory: Responds to noxious stimuli on the right but not on the left Deep Tendon Reflexes: 2+ in the upper  extremities and absent in the lower extremities Plantars: Right: upgoing                         Left: upgoing Cerebellar: Unable to test due to cooperation Gait: not tested due to safety concerns    Laboratory Studies:   Basic Metabolic Panel:  Recent Labs Lab 01/21/17 1925 01/21/17 2350 01/22/17 0400  NA 138  --  141  K 2.6*  --  3.2*  CL 107  --  112*  CO2 26  --  22  GLUCOSE 89  --  92  BUN <5*  --  <5*  CREATININE 0.46  --  0.38*  CALCIUM 8.5*  --  8.0*  MG  --  1.4*  --   PHOS  --  2.6  --     Liver Function Tests:  Recent Labs Lab 01/21/17 1925  AST 20  ALT 11*  ALKPHOS 90  BILITOT 1.1  PROT 6.3*  ALBUMIN 2.8*    Recent Labs Lab 01/21/17 1925  LIPASE 13   No results for input(s): AMMONIA in the last 168 hours.  CBC:  Recent Labs Lab 01/21/17 1925 01/22/17 0400  WBC 5.4 5.2  NEUTROABS 3.1  --   HGB 12.1 11.0*  HCT 37.3 34.3*  MCV 99.1 97.9  PLT 237 200    Cardiac Enzymes:  Recent Labs Lab 01/21/17 1925 01/21/17 2350 01/22/17 0400  TROPONINI 0.09* 0.09* 0.09*    BNP: Invalid input(s): POCBNP  CBG: No results for input(s): GLUCAP in the last 168 hours.  Microbiology: Results for orders placed or performed during the hospital encounter of 01/21/17  Blood Culture (routine x 2)     Status: None (Preliminary result)   Collection Time: 01/21/17  7:25 PM  Result Value Ref Range Status   Specimen Description BLOOD RIGHT ANTECUBITAL  Final   Special Requests   Final    BOTTLES DRAWN AEROBIC AND ANAEROBIC ANA 13CC AER 16CC   Culture NO GROWTH < 12 HOURS  Final   Report Status PENDING  Incomplete  Blood Culture (routine x 2)     Status: None (Preliminary  result)   Collection Time: 01/21/17  7:55 PM  Result Value Ref Range Status   Specimen Description BLOOD R AC  Final   Special Requests BOTTLES DRAWN AEROBIC AND ANAEROBIC  BCHG  Final   Culture NO GROWTH < 12 HOURS  Final   Report Status PENDING  Incomplete  MRSA PCR Screening      Status: None   Collection Time: 01/21/17 11:43 PM  Result Value Ref Range Status   MRSA by PCR NEGATIVE NEGATIVE Final    Comment:        The GeneXpert MRSA Assay (FDA approved for NASAL specimens only), is one component of a comprehensive MRSA colonization surveillance program. It is not intended to diagnose MRSA infection nor to guide or monitor treatment for MRSA infections.     Coagulation Studies:  Recent Labs  01/21/17 1925  LABPROT 18.9*  INR 1.57    Urinalysis:  Recent Labs Lab 01/21/17 1925  COLORURINE YELLOW*  LABSPEC 1.012  PHURINE 6.0  GLUCOSEU NEGATIVE  HGBUR MODERATE*  BILIRUBINUR NEGATIVE  KETONESUR NEGATIVE  PROTEINUR NEGATIVE  NITRITE NEGATIVE  LEUKOCYTESUR LARGE*    Lipid Panel:     Component Value Date/Time   CHOL  05/11/2009 0353    108        ATP III CLASSIFICATION:  <200     mg/dL   Desirable  200-239  mg/dL   Borderline High  >=240    mg/dL   High          TRIG 57 05/11/2009 0353   HDL 43 05/11/2009 0353   CHOLHDL 2.5 05/11/2009 0353   VLDL 11 05/11/2009 0353   LDLCALC  05/11/2009 0353    54        Total Cholesterol/HDL:CHD Risk Coronary Heart Disease Risk Table                     Men   Women  1/2 Average Risk   3.4   3.3  Average Risk       5.0   4.4  2 X Average Risk   9.6   7.1  3 X Average Risk  23.4   11.0        Use the calculated Patient Ratio above and the CHD Risk Table to determine the patient's CHD Risk.        ATP III CLASSIFICATION (LDL):  <100     mg/dL   Optimal  100-129  mg/dL   Near or Above                    Optimal  130-159  mg/dL   Borderline  160-189  mg/dL   High  >190     mg/dL   Very High    HgbA1C: No results found for: HGBA1C  Urine Drug Screen:     Component Value Date/Time   LABOPIA POSITIVE (A) 03/30/2015 1659   COCAINSCRNUR NONE DETECTED 03/30/2015 1659   LABBENZ NONE DETECTED 03/30/2015 1659   AMPHETMU NONE DETECTED 03/30/2015 1659   THCU NONE DETECTED 03/30/2015 1659    LABBARB NONE DETECTED 03/30/2015 1659    Alcohol Level: No results for input(s): ETH in the last 168 hours.  Other results: EKG: atrial fibrillation, rate 108 bpm.  Imaging: Dg Chest Port 1 View  Result Date: 01/21/2017 CLINICAL DATA:  Crackles, seizure, hypoxia EXAM: PORTABLE CHEST 1 VIEW COMPARISON:  01/15/2017 FINDINGS: There is no focal parenchymal opacity. There is no pleural effusion or pneumothorax.  There is stable cardiomegaly. The osseous structures are unremarkable. IMPRESSION: No active disease. Electronically Signed   By: Kathreen Devoid   On: 01/21/2017 19:47     Assessment/Plan: 76 year old female with a history of seizures presenting with breakthrough seizures.  Given Versed in ED.  On Depakote and Keppra as an outpatient.  Keppra increased with last hospitalization.  Will require further increases a this time.  If necessary may require Depakote adjustment a well.  Recommendations: 1.  Increase Keppra to 1000mg  q 12 hours 2.  Change Depakote to 125mg  IV q 12 hours since patient can not continue ER at this time.   3.  Once able will change anticonvulsant therapy back to po.    4.  Seizure precautions 5.  Ativan prn seizures   Alexis Goodell, MD Neurology [phone] 01/22/2017, 11:37 AM

## 2017-01-22 NOTE — Progress Notes (Signed)
Patient arrived to the floor, very drowsy but no signs of distress.  No signs of seizure at this time.  VSS.  Too drowys to take po medications

## 2017-01-22 NOTE — Progress Notes (Signed)
Initial Nutrition Assessment  DOCUMENTATION CODES:   Obesity unspecified  INTERVENTION:  1. Ensure Enlive po BID, each supplement provides 350 kcal and 20 grams of protein 2. Pro-Stat 69mL BID each supplement provides 100 kcal and 15 grams of protein  NUTRITION DIAGNOSIS:   Inadequate oral intake related to poor appetite, lethargy/confusion as evidenced by per patient/family report.  GOAL:   Patient will meet greater than or equal to 90% of their needs  MONITOR:   PO intake, I & O's, Labs, Weight trends, Supplement acceptance  REASON FOR ASSESSMENT:   Low [place]    ASSESSMENT:    [name] is a 76 y.o. female with a known history of afib, OA, CVA, dementia, GERD, HLD, seizure, colovaginal fistula was in a usual state of health until this evening patient was found to be unresponsive. On EMS arrival she was found to having seizure activity which resolved with 4 mg IM Versed.  Spoke with [name]'s Son at bedside. He reports patient was eating chicken and mashed potatoes PTA. Normal PO intake is good, patient feeds self, however, continues to demonstrate net negative protein status with multiple pressure ulcers. Per son, patient consumes "boost punch" products at facility. Weight stable Continues with dementia, lethargy, did not wake up during visit. Labs and medications reviewed: K 3.2 Miralax, Senokot-S, Vitamin C NS @ 62mL/hr  Diet Order:  Diet NPO time specified  Skin:   Stg IV to Leg x2, Stg III to Leg x2, Stg II to Sacrum x2  Last BM:  01/21/2017  Height:   Ht Readings from Last 1 Encounters:  01/21/17 5' 0.5" (1.537 m)    Weight:   Wt Readings from Last 1 Encounters:  01/21/17 232 lb 9.4 oz (105.5 kg)    Ideal Body Weight:  46.59 kg  BMI:  Body mass index is 44.68 kg/m.  Estimated Nutritional Needs:   Kcal:  1550-1850 calories  Protein:  105-127 gm  Fluid:  >= 1.5L  EDUCATION NEEDS:   No education needs identified at this time  [name].  [name], MS, RD LDN Inpatient Clinical Dietitian Pager [phone]

## 2017-01-22 NOTE — Progress Notes (Signed)
Visit made. Patient is currently followed by [facility] with a  hospice diagnosis of unspecified protein malnutrition. She is DNR code. Patient was sent to the ED at the request of her son when she was found to be unresponsive. She had a recent hospitalization from 2/25-2/28 for which she was treated for sepsis secondary to a UTI, breakthrough seizure and aspiration PNA. She is currently receiving IV antibiotics, IV fluids and IV seizure medication, she was unable to take her oral meds this am.  Patient seen lying in bed, son Sherren Mocha present. Patient did respond to voice by opening her eyes, no verbal response during visit. Sherren Mocha continues to be concerned that his mother is not receiving her anti seizure medications on a  regular basis. She will eat and take medications for him, but is sporadic with staff. Emotional support given. Patient did not have any non verbal signs or symptoms of discomfort. Todd appreciative of visit. Will continue to follow and update hospice team. Thank you. Flo Shanks RN, BSN, [facility], [facility] [phone] c

## 2017-01-22 NOTE — Progress Notes (Signed)
SLP Cancellation Note  Patient Details Name: [name] MRN: [id number] DOB: [birth year]   Cancelled treatment:       Reason Eval/Treat Not Completed: Fatigue/lethargy limiting ability to participate;Patient's level of consciousness (reviewed chart notes; consulted NSG re: pt's status) ST services will f/u tomorrow for BSE. Meds via alternative means per NSG; recommended frequent oral care d/t NPO status.    Orinda Kenner, MS, CCC-SLP [name] 01/22/2017, 11:57 AM

## 2017-01-23 LAB — URINE CULTURE

## 2017-01-23 LAB — BLOOD GAS, VENOUS
Acid-Base Excess: 1.3 mmol/L (ref 0.0–2.0)
BICARBONATE: 27.2 mmol/L (ref 20.0–28.0)
PCO2 VEN: 47 mmHg (ref 44.0–60.0)
Patient temperature: 37
pH, Ven: 7.37 (ref 7.250–7.430)

## 2017-01-23 LAB — LEVETIRACETAM LEVEL: Levetiracetam Lvl: 23.7 ug/mL (ref 10.0–40.0)

## 2017-01-23 MED ORDER — DIVALPROEX SODIUM 125 MG PO CSDR: 125.0000 mg | DELAYED_RELEASE_CAPSULE | Freq: Two times a day (BID) | ORAL | Status: DC

## 2017-01-23 MED ORDER — LEVETIRACETAM 500 MG PO TABS: 1000.0000 mg | ORAL_TABLET | Freq: Two times a day (BID) | ORAL | Status: DC

## 2017-01-23 MED ORDER — POTASSIUM CHLORIDE CRYS ER 20 MEQ PO TBCR
40.0000 meq | EXTENDED_RELEASE_TABLET | Freq: Once | ORAL | Status: AC
  Administered 2017-01-23: 40 meq via ORAL
  Filled 2017-01-23: qty 2

## 2017-01-23 MED ORDER — DIVALPROEX SODIUM 125 MG PO CSDR: 125.0000 mg | DELAYED_RELEASE_CAPSULE | Freq: Two times a day (BID) | ORAL | 0 refills | Status: DC

## 2017-01-23 MED ORDER — LEVETIRACETAM 100 MG/ML PO SOLN: 1000.0000 mg | Freq: Two times a day (BID) | ORAL | 12 refills | Status: AC

## 2017-01-23 MED ORDER — DIVALPROEX SODIUM 125 MG PO CSDR: 125.0000 mg | DELAYED_RELEASE_CAPSULE | Freq: Two times a day (BID) | ORAL | 0 refills | Status: AC

## 2017-01-23 MED ORDER — CEPHALEXIN 250 MG PO CAPS: 250.0000 mg | ORAL_CAPSULE | Freq: Two times a day (BID) | ORAL | 0 refills | Status: DC

## 2017-01-23 NOTE — Consult Note (Signed)
[place] Nurse wound consult note Patient is returning this PM To [facility] for palliative care.  Wounds, although full thickness, are shallow and will not heal.  Pink silicone border foam dressings in place and we will keep this plan of care while here.  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 Palliative care goals Wound type:pressure injury Pressure Injury POA: Yes Measurement:Sacrum and left ischium:  Stage 3 pressure injury 3 cm x 3 cm x 0.2 cm  Left heel: Unstageable  3.5 cm x 5 cm wound bed is 100% soft devitalized tissue, foul necrotic odor.  Right distal lower lateral leg  Stage 4  12cm x 4 cm x 0.5 cm with tendon visible  Slough and pink nongranulating tissue present.  Right proximal lower leg Stage 3- 3.5 cm x 2 cm x 0.2 cm pink and moist Wound bed: see above Drainage (amount, consistency, odor) Minimal serosanguinous  Periwound:intact Dressing procedure/placement/frequency:Cleanse sacral wound with soap and water and pat gently dry.  Apply silicone border foam dressing.  Change every 3 days and PRN soilage.  Cleanse left heel wound with NS and pat gently dry. Change every 3 days and PRN soilage. Prevalon boots in place  to offload pressure.  Cleanse wounds to right and left lower legs with NS and pat gently dry.  Apply silicone border foam dressing. Change every three day and PRN Soilage.  Domenic Moras RN BSN [place] Pager [phone]

## 2017-01-23 NOTE — Progress Notes (Signed)
Patient is medically stable for D/C to [facility] today and continue hospice services with [facility]. Per [facility] admissions coordinator at [facility] patient can return today to room 204. RN will call report and arrange EMS for transport. Clinical Education officer, museum (CSW) sent D/C orders to [employer] via [place]. [facility] hospice liaison is aware of above. CSW contacted patient's son [name] and made him aware of above and that [facility] has no medicaid beds. Son is agreeable for patient to return to [facility]. Please reconsult if future social work needs arise. CSW signing off.   [employer], LCSW [phone]

## 2017-01-23 NOTE — Progress Notes (Signed)
Palliative consult received.   Noted patient is currently under service of [facility]. She has discharge orders and EMS has been called for transport back to SNF. Will defer palliative needs to her current Hospice service.   Mariana Kaufman, AGNP-C Palliative Medicine  Please call Palliative Medicine team phone with any questions [phone]. For individual providers please see AMION.

## 2017-01-23 NOTE — Progress Notes (Signed)
Visit made. Patient seen lying in bed, eyes opened to name, patient more alert and interactive today. Drank some water with out any s/s of aspiration. Plan is for discharge back to [facility] today, son Sherren Mocha aware. Medication changes reflected in discharge summary. Hospice team alerted to discharge. Discharge summary faxed to triage. Discussed dose of Keppra with Dr. Doy Mince, dose s/b 1000 mg daily. Dr. Manuella Ghazi notified. Hospice RN notified. Thank you. Flo Shanks RN, BSN, [place] and [facility] [place], [facility] [phone] c

## 2017-01-23 NOTE — Discharge Instructions (Signed)
Seizure, Adult °When you have a seizure: °· Parts of your body may move. °· How aware or awake (conscious) you are may change. °· You may shake (convulse). ° °Some people have symptoms right before a seizure happens. These symptoms may include: °· Fear. °· Worry (anxiety). °· Feeling like you are going to throw up (nausea). °· Feeling like the room is spinning (vertigo). °· Feeling like you saw or heard something before (deja vu). °· Odd tastes or smells. °· Changes in vision, such as seeing flashing lights or spots. ° °Seizures usually last from 30 seconds to 2 minutes. Usually, they are not harmful unless they last a long time. °Follow these instructions at home: °Medicines °· Take over-the-counter and prescription medicines only as told by your doctor. °· Avoid anything that may keep your medicine from working, such as alcohol. °Activity °· Do not do any activities that would be dangerous if you had another seizure, like driving or swimming. Wait until your doctor approves. °· If you live in the [place] ask your local DMV (department of motor vehicles) when you can drive. °· Rest. °Teaching others °· Teach friends and family what to do when you have a seizure. They should: °? Lay you on the ground. °? Protect your head and body. °? Loosen any tight clothing around your neck. °? Turn you on your side. °? Stay with you until you are better. °? Not hold you down. °? Not put anything in your mouth. °? Know whether or not you need emergency care. °General instructions °· Contact your doctor each time you have a seizure. °· Avoid anything that gives you seizures. °· Keep a seizure diary. Write down: °? What you think caused each seizure. °? What you remember about each seizure. °· Keep all follow-up visits as told by your doctor. This is important. °Contact a doctor if: °· You have another seizure. °· You have seizures more often. °· There is any change in what happens during your seizures. °· You continue to have  seizures with treatment. °· You have symptoms of being sick or having an infection. °Get help right away if: °· You have a seizure: °? That lasts longer than 5 minutes. °? That is different than seizures you had before. °? That makes it harder to breathe. °? After you hurt your head. °· After a seizure, you cannot speak or use a part of your body. °· After a seizure, you are confused or have a bad headache. °· You have two or more seizures in a row. °· You are having seizures more often. °· You do not wake up right after a seizure. °· You get hurt during a seizure. °In an emergency: °· These symptoms may be an emergency. Do not wait to see if the symptoms will go away. Get medical help right away. Call your local emergency services (911 in the [place] Do not drive yourself to the hospital. °This information is not intended to replace advice given to you by your health care provider. Make sure you discuss any questions you have with your health care provider. °Document Released: 04/23/2008 Document Revised: 07/18/2016 Document Reviewed: 07/18/2016 °Elsevier Interactive Patient Education © 2017 Elsevier Inc. ° °

## 2017-01-23 NOTE — Discharge Summary (Addendum)
[facility] [place] NAME: [name]    MR#:  [id number]  DATE OF BIRTH:  Jul 28, 1941  DATE OF ADMISSION:  01/21/2017   ADMITTING PHYSICIAN: Harvie Bridge, DO  DATE OF DISCHARGE: 01/23/2017  PRIMARY CARE PHYSICIAN: Kirk Ruths., MD   ADMISSION DIAGNOSIS:  Hypokalemia [E87.6] Lower urinary tract infectious disease [N39.0] Seizure ([place]) [R56.9] Glasgow coma scale total score 3-8, at arrival to emergency department [facility]) [J00.9381] DISCHARGE DIAGNOSIS:  Active Problems:   Altered mental status  SECONDARY DIAGNOSIS:   Past Medical History:  Diagnosis Date  . A-fib ([place]) 02/14/2015  . Arthritis   . Atrial fibrillation ([place])   . Colovaginal fistula   . Complication of anesthesia    DIFFICULTY WAKING   . CVA (cerebral infarction) 02/14/2015  . Dementia   . GERD without esophagitis 02/14/2015  . Glucose intolerance (impaired glucose tolerance)   . Gout   . Hyperlipidemia   . Recurrent UTI   . Seizure ([place]) 02/14/2015   HOSPITAL COURSE:  76 y.o.femalewith a history of afib, OA, CVA, dementia, GERD, HLD, seizure, colovaginal fistulaadmitted with:  1.  Acute metabolic encephalopathy likely secondary to break through seizure. -Seizure/aspirationprecautions - Continue Keppra, Depakote dose increased dose per Neurology - Stopping tramadol as this could lower seizure threshold  2. UTI, not meeting sepsis criteria - changed to PO keflex on D/C  3. Hypokalemia - Replaced  4. Elevated troponin, chronic - due to supply demand ischemia  5. History of afib - Continue Eliquis, cardizem  6. History of CVAwith chronic right-sided weakness and contractures - continue aspirin - stopping statin due to drug interaction and risk for myopathy  7. History of GERD - Continue Nexium  8. History of HLD - Continue Zocor  9. History of OA - Continue Tylenol -Stopping tramadol as this could lower seizure  threshold  10. History of dementia, severe - Continue Aricept, Namenda, Seroquel  11. History of chronic rectovaginal fistula -No surgical intervention planned to do patient's overall poor health and prognosis  12. Left heel wound and chronic sacral acuity stage II both present at time of admission -Continue Santyl -Frequent repositioning -No surgical intervention planned secondary to patient's overall poor health and prognosis DISCHARGE CONDITIONS:  stable CONSULTS OBTAINED:  Treatment Team:  Alexis Goodell, MD DRUG ALLERGIES:  No Known Allergies DISCHARGE MEDICATIONS:   Allergies as of 01/23/2017   No Known Allergies     Medication List    STOP taking these medications   amoxicillin-clavulanate 250-62.5 MG/5ML suspension Commonly known as:  AUGMENTIN   megestrol 40 MG tablet Commonly known as:  MEGACE   simvastatin 20 MG tablet Commonly known as:  ZOCOR   traMADol 50 MG tablet Commonly known as:  ULTRAM     TAKE these medications   acetaminophen 325 MG tablet Commonly known as:  TYLENOL Take 2 tablets (650 mg total) by mouth every 6 (six) hours as needed for mild pain (or Fever >/= 101).   apixaban 5 MG Tabs tablet Commonly known as:  ELIQUIS Take 1 tablet (5 mg total) by mouth 2 (two) times daily.   ascorbic acid 500 MG tablet Commonly known as:  VITAMIN C Take 500 mg by mouth daily.   cephALEXin 250 MG capsule Commonly known as:  KEFLEX Take 1 capsule (250 mg total) by mouth 2 (two) times daily.   collagenase ointment Commonly known as:  SANTYL Apply 1 application topically every other day. To left heel  diltiazem 180 MG 24 hr capsule Commonly known as:  CARDIZEM CD Take 1 capsule (180 mg total) by mouth daily.   divalproex 125 MG capsule Commonly known as:  DEPAKOTE SPRINKLE Take 1 capsule (125 mg total) by mouth 2 (two) times daily. What changed:  when to take this   donepezil 5 MG tablet Commonly known as:  ARICEPT Take 1 tablet (5 mg  total) by mouth at bedtime.   esomeprazole 20 MG capsule Commonly known as:  NEXIUM Take 20 mg by mouth daily at 12 noon.   feeding supplement (ENSURE ENLIVE) Liqd Take 237 mLs by mouth 2 (two) times daily between meals.   feeding supplement (PRO-STAT 64) Liqd Take 30 mLs by mouth daily.   furosemide 20 MG tablet Commonly known as:  LASIX Take 20 mg by mouth daily.   guaifenesin 100 MG/5ML syrup Commonly known as:  ROBITUSSIN Take 100 mg by mouth 3 (three) times daily as needed for cough.   ketotifen 0.025 % ophthalmic solution Commonly known as:  ZADITOR Place 1 drop into both eyes daily as needed (for dry eyes).   levETIRAcetam 100 MG/ML solution Commonly known as:  KEPPRA Take 10 mLs (1,000 mg total) by mouth 2 (two) times daily. Take 10 ml by mouth two times a day for seizure What changed:  how much to take  additional instructions  Another medication with the same name was removed. Continue taking this medication, and follow the directions you see here.   memantine 10 MG tablet Commonly known as:  NAMENDA Take 10 mg by mouth 2 (two) times daily.   mineral oil enema Place 1 enema rectally as needed for severe constipation.   polyethylene glycol packet Commonly known as:  MIRALAX Take 17 g by mouth daily.   potassium chloride 10 MEQ CR tablet Commonly known as:  KLOR-CON Take 10 mEq by mouth daily.   QUEtiapine 50 MG tablet Commonly known as:  SEROQUEL Take 1 tablet (50 mg total) by mouth at bedtime. What changed:  how much to take   senna-docusate 8.6-50 MG tablet Commonly known as:  SENOKOT S Take 1 tablet by mouth daily.      DISCHARGE INSTRUCTIONS:   DIET:  Cardiac diet - Dysphagia level 1(puree); Thin liquids. Aspiration precautions and feeding support. Nutritional DRINK supplements for easier intake. Medications CRUSHED or in Liquid form in puree.  DISCHARGE CONDITION:  Stable ACTIVITY:  Activity as tolerated OXYGEN:  Home Oxygen: No.   Oxygen Delivery: room air DISCHARGE LOCATION:  nursing home - Hospice following  If you experience worsening of your admission symptoms, develop shortness of breath, life threatening emergency, suicidal or homicidal thoughts you must seek medical attention immediately by calling 911 or calling your MD immediately  if symptoms less severe.  You Must read complete instructions/literature along with all the possible adverse reactions/side effects for all the Medicines you take and that have been prescribed to you. Take any new Medicines after you have completely understood and accpet all the possible adverse reactions/side effects.   Please note  You were cared for by a hospitalist during your hospital stay. If you have any questions about your discharge medications or the care you received while you were in the hospital after you are discharged, you can call the unit and asked to speak with the hospitalist on call if the hospitalist that took care of you is not available. Once you are discharged, your primary care physician will handle any further medical issues. Please note  that NO REFILLS for any discharge medications will be authorized once you are discharged, as it is imperative that you return to your primary care physician (or establish a relationship with a primary care physician if you do not have one) for your aftercare needs so that they can reassess your need for medications and monitor your lab values.    On the day of Discharge:  VITAL SIGNS:  Blood pressure 97/70, pulse 97, temperature 98.8 F (37.1 C), temperature source Oral, resp. rate 12, height 5' 0.5" (1.537 m), weight 105.5 kg (232 lb 9.4 oz), SpO2 99 %. PHYSICAL EXAMINATION:  GENERAL:  76 y.o.-year-old patient lying in the bed with no acute distress.  EYES: Pupils equal, round, reactive to light and accommodation. No scleral icterus. Extraocular muscles intact.  HEENT: Head atraumatic, normocephalic. Oropharynx and  nasopharynx clear.  NECK:  Supple, no jugular venous distention. No thyroid enlargement, no tenderness.  LUNGS: Normal breath sounds bilaterally, no wheezing, rales,rhonchi or crepitation. No use of accessory muscles of respiration.  CARDIOVASCULAR: S1, S2 normal. No murmurs, rubs, or gallops.  ABDOMEN: Soft, non-tender, non-distended. Bowel sounds present. No organomegaly or mass.  EXTREMITIES: No pedal edema, cyanosis, or clubbing.  NEUROLOGIC: bedbound female, strength 0/5 B/L LE, difficult to assess  Skin: no rash, stage 2 sacral decub, large left heel ulcer, large right calf decub ligaments seen Psych: at baseline  DATA REVIEW:   CBC  Recent Labs Lab 01/22/17 0400  WBC 5.2  HGB 11.0*  HCT 34.3*  PLT 200    Chemistries   Recent Labs Lab 01/21/17 1925 01/21/17 2350 01/22/17 0400  NA 138  --  141  K 2.6*  --  3.2*  CL 107  --  112*  CO2 26  --  22  GLUCOSE 89  --  92  BUN <5*  --  <5*  CREATININE 0.46  --  0.38*  CALCIUM 8.5*  --  8.0*  MG  --  1.4*  --   AST 20  --   --   ALT 11*  --   --   ALKPHOS 90  --   --   BILITOT 1.1  --   --      Contact information for follow-up providers    Kirk Ruths., MD. Schedule an appointment as soon as possible for a visit on 01/24/2017.   Specialty:  Internal Medicine Why:  @ 1:00 pm  Contact information: [place] [address] [phone]            Contact information for after-discharge care    [facility] .   Specialty:  [place] information: [place] [phone]                  Management plans discussed with the patient, family and they are in agreement.  CODE STATUS: DNR -followed by hospice at [facility].  I have discussed the case with patient's son [name] who is in agreement.  Patient has had multiple readmissions here last 6  month.  He is concerned that patient is not getting prescribed medications at her facility.  He is also looking for different placement.  Pt overall has a poor prognosis. 3rd admission in 6 months. Very high risk for readmissions  TOTAL TIME TAKING CARE OF THIS PATIENT: 45 minutes.    Max Sane M.D on 01/23/2017 at 1:47  PM  Between 7am to 6pm - Pager - [phone]  After 6pm go to [email or web address] - Proofreader  [facility]  Office  [phone]  CC: Primary care physician; Kirk Ruths., MD   Note: This dictation was prepared with Dragon dictation along with smaller phrase technology. Any transcriptional errors that result from this process are unintentional.

## 2017-01-23 NOTE — Evaluation (Signed)
Clinical/Bedside Swallow Evaluation Patient Details  Name: [name] MRN: [id number] Date of Birth: 14-Mar-1941  Today's Date: 01/23/2017 Time: SLP Start Time (ACUTE ONLY): 0800 SLP Stop Time (ACUTE ONLY): 0900 SLP Time Calculation (min) (ACUTE ONLY): 60 min  Past Medical History:  Past Medical History:  Diagnosis Date  . A-fib ([place]) 02/14/2015  . Arthritis   . Atrial fibrillation ([place])   . Colovaginal fistula   . Complication of anesthesia    DIFFICULTY WAKING   . CVA (cerebral infarction) 02/14/2015  . Dementia   . GERD without esophagitis 02/14/2015  . Glucose intolerance (impaired glucose tolerance)   . Gout   . Hyperlipidemia   . Recurrent UTI   . Seizure ([place]) 02/14/2015   Past Surgical History:  Past Surgical History:  Procedure Laterality Date  . BUNIONECTOMY Right   . CHOLECYSTECTOMY    . Right total hip arthroplasty  1998  . Right total hip revision arthroplasty for a periprosthetic fracture  2007   HPI:   Pt is a 76 y.o. female with a known history of afib, OA, CVA, dementia, GERD, HLD, seizure, UTI, colovaginal fistula was in a usual state of health until this evening patient was found to be unresponsive. On EMS arrival she was found to having seizure activity which resolved with 4 mg IM Versed. Patient rec'd Versed, Zosyn, Vanco, NS, KCL and is reportedly closer to her baseline. Nursing has been unable to give patient meds due to lethargy. Pt has a hospice diagnosis of unspecified protein malnutrition.  Assessment / Plan / Recommendation Clinical Impression  Pt appears at reduced risk for aspiration w/ oral intake when following aspiration precautions and providing feeding support. Pt does have Cognitive decline/Dementia which impacted her overall awareness for task of eating and drinking during assessment today - this can increase risk for aspiration to occur. Pt required moderate verbal and visual cues during feeding; required 100% feeding support today. She often  presented w/ "defensive" behavior when taking po's - only opening mouth for a small amount of food, small sips of liquids. No overt s/s of aspiration noted during/post trials; oral phase impacted by Cognitive status but when given time and support, she accepted boluses and cleared adequately. Recommend a Dysphagia level 1 diet w/ thin liquids; strict aspiratioin precautions; Pills in Puree, Crushed, or in liquid form; feeding support at all meals - reduce distractions. Will provide ongoing education w/ Son on pt's presentation - as this is similar to her (just) recent admission, pt may be at a new baseline in presentation w/ oral intake, dysphagia(this could impact pt's ability to meet her nutrition/hydration needs so a Dietician consult is recommended; pt may do better w/ DRINK forms of supplements). NSG updated.  SLP Visit Diagnosis: Dysphagia, oropharyngeal phase (R13.12)    Aspiration Risk  Mild aspiration risk    Diet Recommendation  Dysphagia level 1(puree) w/ thin liquids; monitor straw use. Aspiration precautions and full feeding support encouraging oral intake; nutritional supplements as ordered.     Medication Administration: Crushed with puree (or in liquid form)    Other  Recommendations Recommended Consults:  (Dietician consult; Hospice services following) Oral Care Recommendations: Oral care BID;Staff/trained caregiver to provide oral care   Follow up Recommendations Skilled Nursing facility      Frequency and Duration min 2x/week  1 week       Prognosis Prognosis for Safe Diet Advancement: Fair Barriers to Reach Goals: Cognitive deficits;Severity of deficits      Swallow Study  General Date of Onset: 01/21/17 Previous Swallow Assessment: 01/16/17 Diet Prior to this Study: Dysphagia 1 (puree);Thin liquids Temperature Spikes Noted: No (wbc 5.2) Respiratory Status: Room air History of Recent Intubation: No Behavior/Cognition: Alert;Confused;Distractible;Requires cueing  (declined po trials after first few) Oral Cavity Assessment: Dried secretions (unable to assess d/t Cognition/lack of participation) Oral Care Completed by SLP: Recent completion by staff (pt did expectorate phlegm for SLP; cleared mouth) Oral Cavity - Dentition: Missing dentition Vision:  (n/a) Self-Feeding Abilities: Total assist Patient Positioning: Upright in bed Baseline Vocal Quality: Low vocal intensity Volitional Cough: Cognitively unable to elicit Volitional Swallow: Unable to elicit    Oral/Motor/Sensory Function Overall Oral Motor/Sensory Function:  (appeared grossly wfl w/ bolus management)   Ice Chips Ice chips: Not tested   Thin Liquid Thin Liquid: Impaired Presentation: Straw (fed; 4 sips accepted; decreased awareness of straw use) Oral Phase Impairments:  (verbal cues to receive boluses; defensive behavior) Pharyngeal  Phase Impairments:  (none) Other Comments: min decreased oral awareness for task    Nectar Thick Nectar Thick Liquid: Impaired Presentation: Straw (fed; similar presentation as w/ thin liquid trials) Pharyngeal Phase Impairments:  (none) Other Comments: min decreased oral awareness for task   Honey Thick Honey Thick Liquid: Not tested   Puree Puree: Impaired Presentation: Spoon (fed; 3 trials(small amounts)) Oral Phase Impairments:  (similar behavior as w/ thin liquid trials) Oral Phase Functional Implications: Prolonged oral transit (min) Pharyngeal Phase Impairments:  (none) Other Comments: min decreased oral awareness for task   Solid   GO   Solid: Not tested         Orinda Kenner, MS, CCC-SLP Watson,Katherine 01/23/2017,9:10 AM

## 2017-01-23 NOTE — Progress Notes (Signed)
PT Cancellation Note  Patient Details Name: [name] MRN: [id number] DOB: 19-Jan-1941   Cancelled Treatment:    Reason Eval/Treat Not Completed: Other (comment). Pt with plans for discharge this date back to [facility] with hospice. Per discussion with staff, no PT assessment needed prior to discharge. Will plan to dc current order. Please re-order if needs change.   Marti Acebo 01/23/2017, 9:12 AM Greggory Stallion, PT, DPT [phone]

## 2017-01-23 NOTE — Progress Notes (Signed)
Subjective: Patient improved today.  Re-evaluated by speech and now cleared for po.    Objective: Current vital signs: BP 97/70 (BP Location: Left Arm)   Pulse 97   Temp 98.8 F (37.1 C) (Oral)   Resp 12   Ht 5' 0.5" (1.537 m)   Wt 105.5 kg (232 lb 9.4 oz)   SpO2 99%   BMI 44.68 kg/m  Vital signs in last 24 hours: Temp:  [98.4 F (36.9 C)-99.5 F (37.5 C)] 98.8 F (37.1 C) (03/07 0730) Pulse Rate:  [89-108] 97 (03/07 0730) Resp:  [12-18] 12 (03/07 0730) BP: (97-115)/(66-70) 97/70 (03/07 0730) SpO2:  [94 %-100 %] 99 % (03/07 0730)  Intake/Output from previous day: 03/06 0701 - 03/07 0700 In: 2377.5 [I.V.:1740; IV Piggyback:637.5] Out: -  Intake/Output this shift: No intake/output data recorded. Nutritional status: Diet - low sodium heart healthy DIET - DYS 1 Room service appropriate? Yes with Assist; Fluid consistency: Thin  Neurologic Exam: Mental Status: Alert.  Follows simple commands.  Some speech in response to questioning.   Cranial Nerves: II: Discs flat bilaterally; Blinks to bilateral confrontation,pupils equal, round, reactive to light and accommodation III,IV, VI: ptosis not present, EOMI V,VII: left facial droop VIII: grossly intact IX,X: unable to test XI: unable to test XII: unable to test Motor: Increased tone throughout with some contracture noted of the BUE's.  Able to lift LUE against gravity.  RUE extends at attempt.    Lab Results: Basic Metabolic Panel:  Recent Labs Lab 01/21/17 1925 01/21/17 2350 01/22/17 0400  NA 138  --  141  K 2.6*  --  3.2*  CL 107  --  112*  CO2 26  --  22  GLUCOSE 89  --  92  BUN <5*  --  <5*  CREATININE 0.46  --  0.38*  CALCIUM 8.5*  --  8.0*  MG  --  1.4*  --   PHOS  --  2.6  --     Liver Function Tests:  Recent Labs Lab 01/21/17 1925  AST 20  ALT 11*  ALKPHOS 90  BILITOT 1.1  PROT 6.3*  ALBUMIN 2.8*    Recent Labs Lab 01/21/17 1925  LIPASE 13   No results for input(s): AMMONIA in the  last 168 hours.  CBC:  Recent Labs Lab 01/21/17 1925 01/22/17 0400  WBC 5.4 5.2  NEUTROABS 3.1  --   HGB 12.1 11.0*  HCT 37.3 34.3*  MCV 99.1 97.9  PLT 237 200    Cardiac Enzymes:  Recent Labs Lab 01/21/17 1925 01/21/17 2350 01/22/17 0400 01/22/17 1155  TROPONINI 0.09* 0.09* 0.09* 0.09*    Lipid Panel: No results for input(s): CHOL, TRIG, HDL, CHOLHDL, VLDL, LDLCALC in the last 168 hours.  CBG: No results for input(s): GLUCAP in the last 168 hours.  Microbiology: Results for orders placed or performed during the hospital encounter of 01/21/17  Blood Culture (routine x 2)     Status: None (Preliminary result)   Collection Time: 01/21/17  7:25 PM  Result Value Ref Range Status   Specimen Description BLOOD RIGHT ANTECUBITAL  Final   Special Requests   Final    BOTTLES DRAWN AEROBIC AND ANAEROBIC ANA 13CC AER [place]   Culture NO GROWTH 2 DAYS  Final   Report Status PENDING  Incomplete  Urine culture     Status: Abnormal   Collection Time: 01/21/17  7:25 PM  Result Value Ref Range Status   Specimen Description URINE, RANDOM  Final  Special Requests NONE  Final   Culture (A)  Final    <10,000 COLONIES/mL INSIGNIFICANT GROWTH Performed at [facility], [place] [address], [place], [place] [address]    Report Status 01/23/2017 FINAL  Final  Blood Culture (routine x 2)     Status: None (Preliminary result)   Collection Time: 01/21/17  7:55 PM  Result Value Ref Range Status   Specimen Description BLOOD R AC  Final   Special Requests BOTTLES DRAWN AEROBIC AND ANAEROBIC  BCHG  Final   Culture NO GROWTH 2 DAYS  Final   Report Status PENDING  Incomplete  MRSA PCR Screening     Status: None   Collection Time: 01/21/17 11:43 PM  Result Value Ref Range Status   MRSA by PCR NEGATIVE NEGATIVE Final    Comment:        The GeneXpert MRSA Assay (FDA approved for NASAL specimens only), is one component of a comprehensive MRSA colonization surveillance program. It is  not intended to diagnose MRSA infection nor to guide or monitor treatment for MRSA infections.     Coagulation Studies:  Recent Labs  01/21/17 1925  LABPROT 18.9*  INR 1.57    Imaging: Dg Chest Port 1 View  Result Date: 01/21/2017 CLINICAL DATA:  Crackles, seizure, hypoxia EXAM: PORTABLE CHEST 1 VIEW COMPARISON:  01/15/2017 FINDINGS: There is no focal parenchymal opacity. There is no pleural effusion or pneumothorax. There is stable cardiomegaly. The osseous structures are unremarkable. IMPRESSION: No active disease. Electronically Signed   By: Kathreen Devoid   On: 01/21/2017 19:47    Medications:  I have reviewed the patient's current medications. Scheduled: . acidophilus  1 capsule Oral Daily  . apixaban  5 mg Oral BID  . aspirin EC  81 mg Oral Daily  . aspirin  325 mg Oral Once  . atorvastatin  10 mg Oral Daily  . cefTRIAXone (ROCEPHIN) IVPB 1 gram/50 mL D5W  1 g Intravenous Daily  . collagenase  1 application Topical QODAY  . diltiazem  180 mg Oral Daily  . feeding supplement (ENSURE ENLIVE)  237 mL Oral BID BM  . feeding supplement (PRO-STAT SUGAR FREE 64)  30 mL Oral Daily  . furosemide  20 mg Oral Daily  . levETIRAcetam  1,000 mg Intravenous Q12H  . memantine  10 mg Oral BID  . pantoprazole  40 mg Oral Daily  . polyethylene glycol  17 g Oral Daily  . QUEtiapine  50 mg Oral QHS  . senna-docusate  1 tablet Oral Daily  . sodium chloride flush  3 mL Intravenous Q12H  . valproate sodium  125 mg Intravenous Q12H  . ascorbic acid  500 mg Oral Daily    Assessment/Plan: Patient improved.  Now that cleared for po will change anticoagulant therapy to po.    Recommendations: 1.  Continue AED therapy at current doses.     LOS: 2 days   Alexis Goodell, MD Neurology [phone] 01/23/2017  11:19 AM

## 2017-01-23 NOTE — Progress Notes (Signed)
Report called to Patent examiner at [employer]. Patient alert with confusion, vital signs stable. PIV discontinued at this time. EMS called for transport.  Bethann Punches, RN

## 2017-01-23 NOTE — Progress Notes (Signed)
EMS arrived to transport patient to [facility]. Son, [name] notified at this time.

## 2017-01-26 LAB — CULTURE, BLOOD (ROUTINE X 2)
CULTURE: NO GROWTH
Culture: NO GROWTH

## 2017-01-30 ENCOUNTER — Encounter: Attending: Internal Medicine | Admitting: Internal Medicine

## 2017-01-30 DIAGNOSIS — F0390 Unspecified dementia without behavioral disturbance: Secondary | ICD-10-CM | POA: Insufficient documentation

## 2017-01-30 DIAGNOSIS — L89620 Pressure ulcer of left heel, unstageable: Secondary | ICD-10-CM | POA: Diagnosis not present

## 2017-01-30 DIAGNOSIS — K219 Gastro-esophageal reflux disease without esophagitis: Secondary | ICD-10-CM | POA: Insufficient documentation

## 2017-01-30 DIAGNOSIS — L97213 Non-pressure chronic ulcer of right calf with necrosis of muscle: Secondary | ICD-10-CM | POA: Insufficient documentation

## 2017-01-30 DIAGNOSIS — Z8673 Personal history of transient ischemic attack (TIA), and cerebral infarction without residual deficits: Secondary | ICD-10-CM | POA: Diagnosis not present

## 2017-01-30 DIAGNOSIS — L89153 Pressure ulcer of sacral region, stage 3: Secondary | ICD-10-CM | POA: Diagnosis present

## 2017-01-30 DIAGNOSIS — I4891 Unspecified atrial fibrillation: Secondary | ICD-10-CM | POA: Insufficient documentation

## 2017-01-30 DIAGNOSIS — M109 Gout, unspecified: Secondary | ICD-10-CM | POA: Insufficient documentation

## 2017-01-30 DIAGNOSIS — G40909 Epilepsy, unspecified, not intractable, without status epilepticus: Secondary | ICD-10-CM | POA: Diagnosis not present

## 2017-01-31 NOTE — Progress Notes (Signed)
MAZELLE, HUEBERT ([id number]) Visit Report for 01/30/2017 Allergy List Details Patient Name: [name], [name] Date of Service: 01/30/2017 1:30 PM Medical Record Patient Account Number: [id number] Number: Treating RN: Montey Hora 1941/02/21 (75 y.o. Other Clinician: Date of Birth/Sex: Female) Treating ROBSON, MICHAEL Primary Care Zanae Kuehnle: Frazier Richards Tushar Enns/Extender: G Referring Nyeemah Jennette: Frazier Richards Weeks in Treatment: 0 Allergies Active Allergies No Known Allergies Allergy Notes Electronic Signature(s) Signed: 01/30/2017 5:29:48 PM By: Montey Hora Entered By: Montey Hora on 01/30/2017 13:50:19 [name], [name] ([id number]) -------------------------------------------------------------------------------- Arrival Information Details Patient Name: [name], [name] Date of Service: 01/30/2017 1:30 PM Medical Record Patient Account Number: [id number] Number: Treating RN: Montey Hora 05/22/1941 (75 y.o. Other Clinician: Date of Birth/Sex: Female) Treating ROBSON, MICHAEL Primary Care Trelyn Vanderlinde: Frazier Richards Naeema Patlan/Extender: G Referring Davidmichael Zarazua: Jenelle Mages in Treatment: 0 Visit Information Patient Arrived: Wheel Chair Arrival Time: 13:31 Accompanied By: son Transfer Assistance: Civil Service fast streamer Patient Identification Verified: Yes Secondary Verification Process Yes Completed: History Since Last Visit Added or deleted any medications: No Any new allergies or adverse reactions: No Had a fall or experienced change in activities of daily living that may affect risk of falls: No Signs or symptoms of abuse/neglect since last visito No Hospitalized since last visit: No Has Dressing in Place as Prescribed: Yes Has Compression in Place as Prescribed: Yes Electronic Signature(s) Signed: 01/30/2017 5:29:48 PM By: Montey Hora Entered By: Montey Hora on 01/30/2017 13:49:08 [name]  ([id number]) -------------------------------------------------------------------------------- Clinic Level of Care Assessment Details Patient Name: [name], [name] Date of Service: 01/30/2017 1:30 PM Medical Record Patient Account Number: [id number] Number: Treating RN: Montey Hora 01-31-1941 (75 y.o. Other Clinician: Date of Birth/Sex: Female) Treating ROBSON, MICHAEL Primary Care Nohlan Burdin: Frazier Richards Favian Kittleson/Extender: G Referring Makyiah Lie: Jenelle Mages in Treatment: 0 Clinic Level of Care Assessment Items TOOL 2 Quantity Score []  - Use when only an EandM is performed on the INITIAL visit 0 ASSESSMENTS - Nursing Assessment / Reassessment X - General Physical Exam (combine w/ comprehensive assessment (listed just 1 20 below) when performed on new pt. evals) X - Comprehensive Assessment (HX, ROS, Risk Assessments, Wounds Hx, etc.) 1 25 ASSESSMENTS - Wound and Skin Assessment / Reassessment []  - Simple Wound Assessment / Reassessment - one wound 0 X - Complex Wound Assessment / Reassessment - multiple wounds 4 5 []  - Dermatologic / Skin Assessment (not related to wound area) 0 ASSESSMENTS - Ostomy and/or Continence Assessment and Care []  - Incontinence Assessment and Management 0 []  - Ostomy Care Assessment and Management (repouching, etc.) 0 PROCESS - Coordination of Care X - Simple Patient / Family Education for ongoing care 1 15 []  - Complex (extensive) Patient / Family Education for ongoing care 0 []  - Staff obtains Programmer, systems, Records, Test Results / Process Orders 0 []  - Staff telephones HHA, Nursing Homes / Clarify orders / etc 0 []  - Routine Transfer to another Facility (non-emergent condition) 0 []  - Routine Hospital Admission (non-emergent condition) 0 X - New Admissions / Biomedical engineer / Ordering NPWT, Apligraf, etc. 1 15 []  - Emergency Hospital Admission (emergent condition) 0 [name], [name] ([id number]) X - Simple Discharge  Coordination 1 10 []  - Complex (extensive) Discharge Coordination 0 PROCESS - Special Needs []  - Pediatric / Minor Patient Management 0 []  - Isolation Patient Management 0 []  - Hearing / Language / Visual special needs 0 []  - Assessment of Community assistance (transportation, D/C planning, etc.) 0 X - Additional assistance / Altered mentation 1  15 []  - Support Surface(s) Assessment (bed, cushion, seat, etc.) 0 INTERVENTIONS - Wound Cleansing / Measurement X - Wound Imaging (photographs - any number of wounds) 1 5 []  - Wound Tracing (instead of photographs) 0 []  - Simple Wound Measurement - one wound 0 X - Complex Wound Measurement - multiple wounds 4 5 []  - Simple Wound Cleansing - one wound 0 X - Complex Wound Cleansing - multiple wounds 4 5 INTERVENTIONS - Wound Dressings X - Small Wound Dressing one or multiple wounds 2 10 X - Medium Wound Dressing one or multiple wounds 2 15 []  - Large Wound Dressing one or multiple wounds 0 []  - Application of Medications - injection 0 INTERVENTIONS - Miscellaneous []  - External ear exam 0 []  - Specimen Collection (cultures, biopsies, blood, body fluids, etc.) 0 []  - Specimen(s) / Culture(s) sent or taken to Lab for analysis 0 X - Patient Transfer (multiple staff / Civil Service fast streamer / Similar devices) 1 10 []  - Simple Staple / Suture removal (25 or less) 0 [name], [name] ([id number]) []  - Complex Staple / Suture removal (26 or more) 0 []  - Hypo / Hyperglycemic Management (close monitor of Blood Glucose) 0 X - Ankle / Brachial Index (ABI) - do not check if billed separately 1 15 Has the patient been seen at the hospital within the last three years: Yes Total Score: 240 Level Of Care: New/Established - Level 5 Electronic Signature(s) Signed: 01/30/2017 5:29:48 PM By: Montey Hora Entered By: Montey Hora on 01/30/2017 16:29:07 [name] ([id number]) -------------------------------------------------------------------------------- Encounter  Discharge Information Details Patient Name: [name], [name] Date of Service: 01/30/2017 1:30 PM Medical Record Patient Account Number: [id number] Number: Treating RN: Montey Hora 06-01-1941 (75 y.o. Other Clinician: Date of Birth/Sex: Female) Treating ROBSON, MICHAEL Primary Care Nilda Keathley: Frazier Richards Sanel Stemmer/Extender: G Referring Khaza Blansett: Jenelle Mages in Treatment: 0 Encounter Discharge Information Items Discharge Pain Level: 0 Discharge Condition: Stable Ambulatory Status: Wheelchair Discharge Destination: Nursing Home Transportation: Private Auto Accompanied By: son Schedule Follow-up Appointment: Yes Medication Reconciliation completed and provided to Patient/Care No Azora Bonzo: Provided on Clinical Summary of Care: 01/30/2017 Form Type Recipient Paper Patient EM Electronic Signature(s) Signed: 01/30/2017 3:08:37 PM By: Ruthine Dose Previous Signature: 01/30/2017 2:06:46 PM Version By: Montey Hora Entered By: Ruthine Dose on 01/30/2017 15:08:37 Koral, [name] ([id number]) -------------------------------------------------------------------------------- Lower Extremity Assessment Details Patient Name: [name], [name] Date of Service: 01/30/2017 1:30 PM Medical Record Patient Account Number: [id number] Number: Treating RN: Montey Hora May 13, 1941 (75 y.o. Other Clinician: Date of Birth/Sex: Female) Treating ROBSON, MICHAEL Primary Care Skyelar Halliday: Frazier Richards Myreon Wimer/Extender: G Referring Devontaye Ground: Jenelle Mages in Treatment: 0 Edema Assessment Assessed: [Left: No] [Right: No] Edema: [Left: No] [Right: No] Vascular Assessment Pulses: Dorsalis Pedis Palpable: [Left:Yes] [Right:Yes] Posterior Tibial Palpable: [Left:Yes] [Right:Yes] Extremity colors, hair growth, and conditions: Extremity Color: [Left:Normal] [Right:Normal] Hair Growth on Extremity: [Left:No] [Right:No] Temperature of Extremity:  [Left:Warm] [Right:Warm] Capillary Refill: [Left:< 3 seconds] [Right:< 3 seconds] Toe Nail Assessment Left: Right: Thick: Yes Yes Discolored: No No Deformed: No No Improper Length and Hygiene: No No Electronic Signature(s) Signed: 01/30/2017 5:29:48 PM By: Montey Hora Entered By: Montey Hora on 01/30/2017 14:01:01 [name], [name] ([id number]) -------------------------------------------------------------------------------- Multi Wound Chart Details Patient Name: [name], [name] Date of Service: 01/30/2017 1:30 PM Medical Record Patient Account Number: [id number] Number: Treating RN: Montey Hora [birth year] (75 y.o. Other Clinician: Date of Birth/Sex: Female) Treating ROBSON, Gervais Primary Care Wayne Brunker: Frazier Richards Atira Borello/Extender: G Referring Brigida Scotti: Frazier Richards  Weeks in Treatment: 0 Vital Signs Height(in): Pulse(bpm): 103 Weight(lbs): Blood Pressure 134/78 (mmHg): Body Mass Index(BMI): Temperature(F): 97.7 Respiratory Rate 16 (breaths/min): Photos: [5:No Photos] [6:No Photos] [7:No Photos] Wound Location: [5:Sacrum] [6:Left Calcaneus] [7:Right Lower Leg - Lateral, Proximal] Wounding Event: [5:Pressure Injury] [6:Pressure Injury] [7:Pressure Injury] Primary Etiology: [5:Pressure Ulcer] [6:Pressure Ulcer] [7:Pressure Ulcer] Comorbid History: [5:Cataracts, Arrhythmia, History of pressure wounds, Gout, Seizure Disorder] [6:Cataracts, Arrhythmia, History of pressure wounds, Gout, Seizure Disorder] [7:Cataracts, Arrhythmia, History of pressure wounds, Gout, Seizure Disorder] Date Acquired: [5:10/22/2016] [6:10/22/2016] [7:10/22/2016] Weeks of Treatment: [5:0] [6:0] [7:0] Wound Status: [5:Open] [6:Open] [7:Open] Measurements L x W x D 2.5x2.7x0.1 [6:3.4x5x0.2] [7:2x1.4x0.1] (cm) Area (cm) : [5:5.301] [6:13.352] [7:2.199] Volume (cm) : [5:0.53] [6:2.67] [7:0.22] % Reduction in Area: [5:0.00%] [6:0.00%] [7:0.00%] % Reduction in Volume:  0.00% [6:0.00%] [7:0.00%] Classification: [5:Category/Stage II] [6:Unstageable/Unclassified] [7:Category/Stage II] Exudate Amount: [5:Large] [6:Large] [7:Large] Exudate Type: [5:Serous] [6:Serous] [7:Serous] Exudate Color: [5:amber] [6:amber] [7:amber] Wound Margin: [5:Flat and Intact] [6:Flat and Intact] [7:Flat and Intact] Granulation Amount: [5:Large (67-100%)] [6:None Present (0%)] [7:Large (67-100%)] Granulation Quality: [5:Pink] [6:N/A] [7:Pink] Necrotic Amount: [5:Small (1-33%)] [6:Large (67-100%)] [7:Small (1-33%)] Necrotic Tissue: [5:Adherent Slough] [6:Eschar] [7:Adherent Slough] Exposed Structures: [name], [name] ([id number]) Fascia: No Fascia: No Fascia: No Fat Layer (Subcutaneous Fat Layer (Subcutaneous Fat Layer (Subcutaneous Tissue) Exposed: No Tissue) Exposed: No Tissue) Exposed: No Tendon: No Tendon: No Tendon: No Muscle: No Muscle: No Muscle: No Joint: No Joint: No Joint: No Bone: No Bone: No Bone: No Limited to Skin Limited to Skin Limited to Skin Breakdown Breakdown Breakdown Epithelialization: None None None Periwound Skin Texture: Excoriation: No Excoriation: No Excoriation: No Induration: No Induration: No Induration: No Callus: No Callus: No Callus: No Crepitus: No Crepitus: No Crepitus: No Rash: No Rash: No Rash: No Scarring: No Scarring: No Scarring: No Periwound Skin Maceration: No Maceration: No Maceration: No Moisture: Dry/Scaly: No Dry/Scaly: No Dry/Scaly: No Periwound Skin Color: Atrophie Blanche: No Atrophie Blanche: No Atrophie Blanche: No Cyanosis: No Cyanosis: No Cyanosis: No Ecchymosis: No Ecchymosis: No Ecchymosis: No Erythema: No Erythema: No Erythema: No Hemosiderin Staining: No Hemosiderin Staining: No Hemosiderin Staining: No Mottled: No Mottled: No Mottled: No Pallor: No Pallor: No Pallor: No Rubor: No Rubor: No Rubor: No Temperature: N/A N/A No Abnormality Tenderness on Yes Yes  Yes Palpation: Wound Preparation: Ulcer Cleansing: Ulcer Cleansing: Ulcer Cleansing: Rinsed/Irrigated with Rinsed/Irrigated with Rinsed/Irrigated with Saline Saline Saline Topical Anesthetic Topical Anesthetic Topical Anesthetic Applied: Other: lidocaine Applied: Other: lidocaine Applied: Other: lidocaine 4% 4% 4% Wound Number: 8 N/A N/A Photos: No Photos N/A N/A Wound Location: Right Lower Leg - Lateral, N/A N/A Distal Wounding Event: Pressure Injury N/A N/A Primary Etiology: Pressure Ulcer N/A N/A Comorbid History: Cataracts, Arrhythmia, N/A N/A History of pressure wounds, Gout, Seizure Disorder Date Acquired: 10/22/2016 N/A N/A Weeks of Treatment: 0 N/A N/A Wound Status: Open N/A N/A [name], [name] ([id number]) Measurements L x W x D 12.5x3.3x0.4 N/A N/A (cm) Area (cm) : 32.398 N/A N/A Volume (cm) : 12.959 N/A N/A % Reduction in Area: 0.00% N/A N/A % Reduction in Volume: 0.00% N/A N/A Classification: Category/Stage IV N/A N/A Exudate Amount: Large N/A N/A Exudate Type: Serous N/A N/A Exudate Color: amber N/A N/A Wound Margin: Flat and Intact N/A N/A Granulation Amount: Small (1-33%) N/A N/A Granulation Quality: Pink N/A N/A Necrotic Amount: Large (67-100%) N/A N/A Necrotic Tissue: Eschar N/A N/A Exposed Structures: Tendon: Yes N/A N/A Fascia: No Fat Layer (Subcutaneous Tissue) Exposed: No Muscle: No Joint: No Bone: No Epithelialization: None  N/A N/A Periwound Skin Texture: Excoriation: No N/A N/A Induration: No Callus: No Crepitus: No Rash: No Scarring: No Periwound Skin Maceration: No N/A N/A Moisture: Dry/Scaly: No Periwound Skin Color: Atrophie Blanche: No N/A N/A Cyanosis: No Ecchymosis: No Erythema: No Hemosiderin Staining: No Mottled: No Pallor: No Rubor: No Temperature: No Abnormality N/A N/A Tenderness on Yes N/A N/A Palpation: Wound Preparation: Ulcer Cleansing: N/A N/A Rinsed/Irrigated with Saline Topical Anesthetic Applied:  Other: lidocaine 4% [name], [name] ([id number]) Treatment Notes Electronic Signature(s) Signed: 01/30/2017 4:24:49 PM By: Linton Ham MD Previous Signature: 01/30/2017 2:06:28 PM Version By: Montey Hora Entered By: Linton Ham on 01/30/2017 14:48:53 Bolz, [name] ([id number]) -------------------------------------------------------------------------------- [place] Details Patient Name: Donahey, Lorenzo B. Date of Service: 01/30/2017 1:30 PM Medical Record Patient Account Number: [id number] Number: Treating RN: Montey Hora Nov 09, 1941 (75 y.o. Other Clinician: Date of Birth/Sex: Female) Treating ROBSON, [place] Primary Care Elice Crigger: Frazier Richards Diamonique Ruedas/Extender: G Referring Govind Furey: Jenelle Mages in Treatment: 0 Active Inactive ` Nutrition Nursing Diagnoses: Potential for alteratiion in Nutrition/Potential for imbalanced nutrition Goals: Patient/caregiver agrees to and verbalizes understanding of need to use nutritional supplements and/or vitamins as prescribed Date Initiated: 01/30/2017 Target Resolution Date: 04/25/2017 Goal Status: Active Interventions: Assess patient nutrition upon admission and as needed per policy Notes: ` Orientation to the Wound Care Program Nursing Diagnoses: Knowledge deficit related to the wound healing center program Goals: Patient/caregiver will verbalize understanding of the [place] Date Initiated: 01/30/2017 Target Resolution Date: 02/22/2017 Goal Status: Active Interventions: Provide education on orientation to the wound center Notes: ` Pressure Alberg, Chrissa B. ([id number]) Nursing Diagnoses: Potential for impaired tissue integrity related to pressure, friction, moisture, and shear Goals: Patient will remain free from development of additional pressure ulcers Date Initiated: 01/30/2017 Target Resolution Date: 04/26/2017 Goal Status:  Active Interventions: Assess: immobility, friction, shearing, incontinence upon admission and as needed Notes: ` Wound/Skin Impairment Nursing Diagnoses: Impaired tissue integrity Goals: Patient/caregiver will verbalize understanding of skin care regimen Date Initiated: 01/30/2017 Target Resolution Date: 04/26/2017 Goal Status: Active Ulcer/skin breakdown will have a volume reduction of 30% by week 4 Date Initiated: 01/30/2017 Target Resolution Date: 04/26/2017 Goal Status: Active Ulcer/skin breakdown will have a volume reduction of 50% by week 8 Date Initiated: 01/30/2017 Target Resolution Date: 04/26/2017 Goal Status: Active Ulcer/skin breakdown will have a volume reduction of 80% by week 12 Date Initiated: 01/30/2017 Target Resolution Date: 04/26/2017 Goal Status: Active Ulcer/skin breakdown will heal within 14 weeks Date Initiated: 01/30/2017 Target Resolution Date: 04/26/2017 Goal Status: Active Interventions: Assess patient/caregiver ability to obtain necessary supplies Assess patient/caregiver ability to perform ulcer/skin care regimen upon admission and as needed Assess ulceration(s) every visit Notes: Electronic Signature(s) Signed: 01/30/2017 5:29:48 PM By: Tacy Learn, [name] ([id number]) Entered By: Montey Hora on 01/30/2017 14:27:39 Fambro, [name] ([id number]) -------------------------------------------------------------------------------- Pain Assessment Details Patient Name: Patalano, Keonia B. Date of Service: 01/30/2017 1:30 PM Medical Record Patient Account Number: [id number] Number: Treating RN: Montey Hora 16-Mar-1941 (75 y.o. Other Clinician: Date of Birth/Sex: Female) Treating ROBSON, MICHAEL Primary Care Debborah Alonge: Frazier Richards Anadalay Macdonell/Extender: G Referring Aasir Daigler: Jenelle Mages in Treatment: 0 Active Problems Location of Pain Severity and Description of Pain Patient Has Paino No Site Locations Pain Management and  Medication Current Pain Management: Electronic Signature(s) Signed: 01/30/2017 5:29:48 PM By: Montey Hora Entered By: Montey Hora on 01/30/2017 13:49:14 Stmarie, [name] ([id number]) -------------------------------------------------------------------------------- Patient/Caregiver Education Details Patient Name: Garretson, Hyacinth B. Date of Service: 01/30/2017 1:30 PM Medical Record  Patient Account Number: [id number] Number: Treating RN: Montey Hora Mar 28, 1941 (75 y.o. Other Clinician: Date of Birth/Gender: Female) Treating ROBSON, [place] Primary Care Physician: Frazier Richards Physician/Extender: G Referring Physician: Jenelle Mages in Treatment: 0 Education Assessment Education Provided To: Caregiver Education Topics Provided Wound/Skin Impairment: Handouts: Other: wound care as ordered Methods: Explain/Verbal Responses: State content correctly Electronic Signature(s) Signed: 01/30/2017 5:29:48 PM By: Montey Hora Entered By: Montey Hora on 01/30/2017 14:07:01 Buenger, [name] ([id number]) -------------------------------------------------------------------------------- Wound Assessment Details Patient Name: Frommelt, Anayelli B. Date of Service: 01/30/2017 1:30 PM Medical Record Patient Account Number: [id number] Number: Treating RN: Montey Hora 03/19/1941 (75 y.o. Other Clinician: Date of Birth/Sex: Female) Treating ROBSON, MICHAEL Primary Care Luellen Howson: Frazier Richards Lavergne Hiltunen/Extender: G Referring Veron Senner: Jenelle Mages in Treatment: 0 Wound Status Wound Number: 5 Primary Pressure Ulcer Etiology: Wound Location: Sacrum Wound Open Wounding Event: Pressure Injury Status: Date Acquired: 10/22/2016 Comorbid Cataracts, Arrhythmia, History of Weeks Of Treatment: 0 History: pressure wounds, Gout, Seizure Clustered Wound: No Disorder Photos Photo Uploaded By: Montey Hora on 01/30/2017 17:06:25 Wound  Measurements Length: (cm) 2.5 Width: (cm) 2.7 Depth: (cm) 0.1 Area: (cm) 5.301 Volume: (cm) 0.53 % Reduction in Area: 0% % Reduction in Volume: 0% Epithelialization: None Tunneling: No Undermining: No Wound Description Classification: Category/Stage II Foul Odor After Wound Margin: Flat and Intact Slough/Fibrino Exudate Amount: Large Exudate Type: Serous Exudate Color: amber Cleansing: No Yes Wound Bed Granulation Amount: Large (67-100%) Exposed Structure Granulation Quality: Pink Fascia Exposed: No Gulick, Genna B. ([id number]) Necrotic Amount: Small (1-33%) Fat Layer (Subcutaneous Tissue) Exposed: No Necrotic Quality: Adherent Slough Tendon Exposed: No Muscle Exposed: No Joint Exposed: No Bone Exposed: No Limited to Skin Breakdown Periwound Skin Texture Texture Color No Abnormalities Noted: No No Abnormalities Noted: No Callus: No Atrophie Blanche: No Crepitus: No Cyanosis: No Excoriation: No Ecchymosis: No Induration: No Erythema: No Rash: No Hemosiderin Staining: No Scarring: No Mottled: No Pallor: No Moisture Rubor: No No Abnormalities Noted: No Dry / Scaly: No Temperature / Pain Maceration: No Tenderness on Palpation: Yes Wound Preparation Ulcer Cleansing: Rinsed/Irrigated with Saline Topical Anesthetic Applied: Other: lidocaine 4%, Treatment Notes Wound #5 (Sacrum) 1. Cleansed with: Clean wound with Normal Saline 2. Anesthetic Topical Lidocaine 4% cream to wound bed prior to debridement 4. Dressing Applied: Prisma Ag 5. Secondary Dressing Applied Bordered Foam Dressing Dry Gauze Electronic Signature(s) Signed: 01/30/2017 2:03:46 PM By: Montey Hora Entered By: Montey Hora on 01/30/2017 14:03:46 Hollern, [name] ([id number]) -------------------------------------------------------------------------------- Wound Assessment Details Patient Name: [name], [name] Date of Service: 01/30/2017 1:30 PM Medical Record Patient Account  Number: [id number] Number: Treating RN: Montey Hora 1941/01/18 (75 y.o. Other Clinician: Date of Birth/Sex: Female) Treating ROBSON, [place] Primary Care Roddy Bellamy: Frazier Richards Molley Houser/Extender: G Referring Keijuan Schellhase: Jenelle Mages in Treatment: 0 Wound Status Wound Number: 6 Primary Pressure Ulcer Etiology: Wound Location: Left Calcaneus Wound Open Wounding Event: Pressure Injury Status: Date Acquired: 10/22/2016 Comorbid Cataracts, Arrhythmia, History of Weeks Of Treatment: 0 History: pressure wounds, Gout, Seizure Clustered Wound: No Disorder Photos Photo Uploaded By: Montey Hora on 01/30/2017 17:07:09 Wound Measurements Length: (cm) 3.4 Width: (cm) 5 Depth: (cm) 0.2 Area: (cm) 13.352 Volume: (cm) 2.67 % Reduction in Area: 0% % Reduction in Volume: 0% Epithelialization: None Tunneling: No Undermining: No Wound Description Classification: Unstageable/Unclassified Foul Odor Afte Wound Margin: Flat and Intact Slough/Fibrino Exudate Amount: Large Exudate Type: Serous Exudate Color: amber r Cleansing: No No Wound Bed Granulation Amount: None Present (0%) Exposed Structure Necrotic Amount: Large (  67-100%) Fascia Exposed: No Ostrum, Edith B. ([id number]) Necrotic Quality: Eschar Fat Layer (Subcutaneous Tissue) Exposed: No Tendon Exposed: No Muscle Exposed: No Joint Exposed: No Bone Exposed: No Limited to Skin Breakdown Periwound Skin Texture Texture Color No Abnormalities Noted: No No Abnormalities Noted: No Callus: No Atrophie Blanche: No Crepitus: No Cyanosis: No Excoriation: No Ecchymosis: No Induration: No Erythema: No Rash: No Hemosiderin Staining: No Scarring: No Mottled: No Pallor: No Moisture Rubor: No No Abnormalities Noted: No Dry / Scaly: No Temperature / Pain Maceration: No Tenderness on Palpation: Yes Wound Preparation Ulcer Cleansing: Rinsed/Irrigated with Saline Topical Anesthetic  Applied: Other: lidocaine 4%, Treatment Notes Wound #6 (Left Calcaneus) 1. Cleansed with: Clean wound with Normal Saline 2. Anesthetic Topical Lidocaine 4% cream to wound bed prior to debridement 4. Dressing Applied: Santyl Ointment 5. Secondary Dressing Applied Guaze, ABD and kerlix/Conform 7. Secured with Recruitment consultant) Signed: 01/30/2017 2:04:49 PM By: Montey Hora Entered By: Montey Hora on 01/30/2017 14:04:48 Depasquale, [name] ([id number]) -------------------------------------------------------------------------------- Wound Assessment Details Patient Name: Burdin, Johnasia B. Date of Service: 01/30/2017 1:30 PM Medical Record Patient Account Number: [id number] Number: Treating RN: Montey Hora Mar 30, 1941 (75 y.o. Other Clinician: Date of Birth/Sex: Female) Treating ROBSON, MICHAEL Primary Care Shaquira Moroz: Frazier Richards Adylin Hankey/Extender: G Referring Hiawatha Merriott: Jenelle Mages in Treatment: 0 Wound Status Wound Number: 7 Primary Pressure Ulcer Etiology: Wound Location: Right Lower Leg - Lateral, Proximal Wound Open Status: Wounding Event: Pressure Injury Comorbid Cataracts, Arrhythmia, History of Date Acquired: 10/22/2016 History: pressure wounds, Gout, Seizure Weeks Of Treatment: 0 Disorder Clustered Wound: No Photos Photo Uploaded By: Montey Hora on 01/30/2017 17:07:09 Wound Measurements Length: (cm) 2 Width: (cm) 1.4 Depth: (cm) 0.1 Area: (cm) 2.199 Volume: (cm) 0.22 % Reduction in Area: 0% % Reduction in Volume: 0% Epithelialization: None Tunneling: No Undermining: No Wound Description Classification: Category/Stage II Foul Odor After Wound Margin: Flat and Intact Slough/Fibrino Exudate Amount: Large Exudate Type: Serous Exudate Color: amber Cleansing: No Yes Wound Bed Granulation Amount: Large (67-100%) Exposed Structure Granulation Quality: Pink Fascia Exposed: No Koenen, Alexina B.  ([id number]) Necrotic Amount: Small (1-33%) Fat Layer (Subcutaneous Tissue) Exposed: No Necrotic Quality: Adherent Slough Tendon Exposed: No Muscle Exposed: No Joint Exposed: No Bone Exposed: No Limited to Skin Breakdown Periwound Skin Texture Texture Color No Abnormalities Noted: No No Abnormalities Noted: No Callus: No Atrophie Blanche: No Crepitus: No Cyanosis: No Excoriation: No Ecchymosis: No Induration: No Erythema: No Rash: No Hemosiderin Staining: No Scarring: No Mottled: No Pallor: No Moisture Rubor: No No Abnormalities Noted: No Dry / Scaly: No Temperature / Pain Maceration: No Temperature: No Abnormality Tenderness on Palpation: Yes Wound Preparation Ulcer Cleansing: Rinsed/Irrigated with Saline Topical Anesthetic Applied: Other: lidocaine 4%, Treatment Notes Wound #7 (Right, Proximal, Lateral Lower Leg) 1. Cleansed with: Clean wound with Normal Saline 2. Anesthetic Topical Lidocaine 4% cream to wound bed prior to debridement 4. Dressing Applied: Hydrogel Prisma Ag 5. Secondary Dressing Applied Guaze, ABD and kerlix/Conform 7. Secured with Tape Other (specify in notes) Notes kerlix and coban wrap from toes to 3cm below the knee Electronic Signature(s) Signed: 01/30/2017 2:05:28 PM By: Montey Hora Entered By: Montey Hora on 01/30/2017 14:05:28 Hayner, [name] ([id number]) Andujar, [name] ([id number]) -------------------------------------------------------------------------------- Wound Assessment Details Patient Name: Funari, Marbella B. Date of Service: 01/30/2017 1:30 PM Medical Record Patient Account Number: [id number] Number: Treating RN: Montey Hora 1941-07-10 (75 y.o. Other Clinician: Date of Birth/Sex: Female) Treating ROBSON, Fleetwood Primary Care Braylan Faul: Frazier Richards Yitta Gongaware/Extender: G Referring Anjel Perfetti: Ouida Sills,  Ruthann Cancer Weeks in Treatment: 0 Wound Status Wound Number: 8 Primary Pressure  Ulcer Etiology: Wound Location: Right Lower Leg - Lateral, Distal Wound Open Status: Wounding Event: Pressure Injury Comorbid Cataracts, Arrhythmia, History of Date Acquired: 10/22/2016 History: pressure wounds, Gout, Seizure Weeks Of Treatment: 0 Disorder Clustered Wound: No Photos Photo Uploaded By: Montey Hora on 01/30/2017 17:07:39 Wound Measurements Length: (cm) 12.5 Width: (cm) 3.3 Depth: (cm) 0.4 Area: (cm) 32.398 Volume: (cm) 12.959 % Reduction in Area: 0% % Reduction in Volume: 0% Epithelialization: None Tunneling: No Undermining: No Wound Description Classification: Category/Stage IV Foul Odor After Wound Margin: Flat and Intact Slough/Fibrino Exudate Amount: Large Exudate Type: Serous Exudate Color: amber Cleansing: No Yes Wound Bed Granulation Amount: Small (1-33%) Exposed Structure Granulation Quality: Pink Fascia Exposed: No Ament, Taylar B. ([id number]) Necrotic Amount: Large (67-100%) Fat Layer (Subcutaneous Tissue) Exposed: No Necrotic Quality: Eschar Tendon Exposed: Yes Muscle Exposed: No Joint Exposed: No Bone Exposed: No Periwound Skin Texture Texture Color No Abnormalities Noted: No No Abnormalities Noted: No Callus: No Atrophie Blanche: No Crepitus: No Cyanosis: No Excoriation: No Ecchymosis: No Induration: No Erythema: No Rash: No Hemosiderin Staining: No Scarring: No Mottled: No Pallor: No Moisture Rubor: No No Abnormalities Noted: No Dry / Scaly: No Temperature / Pain Maceration: No Temperature: No Abnormality Tenderness on Palpation: Yes Wound Preparation Ulcer Cleansing: Rinsed/Irrigated with Saline Topical Anesthetic Applied: Other: lidocaine 4%, Treatment Notes Wound #8 (Right, Distal, Lateral Lower Leg) 1. Cleansed with: Clean wound with Normal Saline 2. Anesthetic Topical Lidocaine 4% cream to wound bed prior to debridement 4. Dressing Applied: Hydrogel Prisma Ag 5. Secondary Dressing Applied Guaze,  ABD and kerlix/Conform 7. Secured with Tape Other (specify in notes) Notes kerlix and coban wrap from toes to 3cm below the knee Electronic Signature(s) Signed: 01/30/2017 2:06:06 PM By: Montey Hora Entered By: Montey Hora on 01/30/2017 14:06:05 Pautz, [name] ([id number]) Myren, [name] ([id number]) -------------------------------------------------------------------------------- [place] Details Patient Name: Bergren, Camrin B. Date of Service: 01/30/2017 1:30 PM Medical Record Patient Account Number: [id number] Number: Treating RN: Montey Hora [birth year] (75 y.o. Other Clinician: Date of Birth/Sex: Female) Treating ROBSON, MICHAEL Primary Care Miaisabella Bacorn: Frazier Richards Erdine Hulen/Extender: G Referring Latham Kinzler: Jenelle Mages in Treatment: 0 Vital Signs Time Taken: 13:35 Temperature (F): 97.7 Pulse (bpm): 103 Respiratory Rate (breaths/min): 16 Blood Pressure (mmHg): 134/78 Reference Range: 80 - 120 mg / dl Electronic Signature(s) Signed: 01/30/2017 5:29:48 PM By: Montey Hora Entered By: Montey Hora on 01/30/2017 13:50:11

## 2017-01-31 NOTE — Progress Notes (Signed)
CAYCE, PASCHAL ([id number]) Visit Report for 01/30/2017 Abuse/Suicide Risk Screen Details Patient Name: [name], [name] Date of Service: 01/30/2017 1:30 PM Medical Record Patient Account Number: [id number] Number: Treating RN: Montey Hora [birth year] (75 y.o. Other Clinician: Date of Birth/Sex: Female) Treating ROBSON, MICHAEL Primary Care Nasiah Lehenbauer: Frazier Richards Enez Monahan/Extender: G Referring Saramarie Stinger: Jenelle Mages in Treatment: 0 Abuse/Suicide Risk Screen Items Answer ABUSE/SUICIDE RISK SCREEN: Has anyone close to you tried to hurt or harm you recentlyo No Do you feel uncomfortable with anyone in your familyo No Has anyone forced you do things that you didnot want to doo No Do you have any thoughts of harming yourselfo No Patient displays signs or symptoms of abuse and/or neglect. No Electronic Signature(s) Signed: 01/30/2017 5:29:48 PM By: Montey Hora Entered By: Montey Hora on 01/30/2017 13:56:49 Pellerito, Raul Del ([id number]) -------------------------------------------------------------------------------- Activities of Daily Living Details Patient Name: [name], [name] Date of Service: 01/30/2017 1:30 PM Medical Record Patient Account Number: [id number] Number: Treating RN: Montey Hora [birth year] (75 y.o. Other Clinician: Date of Birth/Sex: Female) Treating ROBSON, MICHAEL Primary Care Deneen Slager: Frazier Richards Naila Elizondo/Extender: G Referring Delynda Sepulveda: Jenelle Mages in Treatment: 0 Activities of Daily Living Items Answer Activities of Daily Living (Please select one for each item) Drive Automobile Not Able Take Medications Need Assistance Use Telephone Not Able Care for Appearance Need Assistance Use Toilet Need Assistance Bath / Shower Need Assistance Dress Self Need Assistance Feed Self Need Assistance Walk Need Assistance Get In / Out Bed Need Assistance Housework Need Assistance Prepare Meals Not  Able Handle Money Not Able Shop for Self Not Able Electronic Signature(s) Signed: 01/30/2017 5:29:48 PM By: Montey Hora Entered By: Montey Hora on 01/30/2017 13:57:17 Ouzts, Raul Del ([id number]) -------------------------------------------------------------------------------- Education Assessment Details Patient Name: [name], [name] Date of Service: 01/30/2017 1:30 PM Medical Record Patient Account Number: [id number] Number: Treating RN: Montey Hora 01/20/1941 (75 y.o. Other Clinician: Date of Birth/Sex: Female) Treating ROBSON, MICHAEL Primary Care Shae Hinnenkamp: Frazier Richards Avory Rahimi/Extender: G Referring Verlie Liotta: Jenelle Mages in Treatment: 0 Primary Learner Assessed: Caregiver Reason Patient is not Primary Learner: nurses at facility Learning Preferences/Education Level/Primary Language Learning Preference: Printed Material Highest Education Level: College or Above Preferred Language: English Cognitive Barrier Assessment/Beliefs Language Barrier: No Translator Needed: No Memory Deficit: No Emotional Barrier: No Cultural/Religious Beliefs Affecting Medical No Care: Physical Barrier Assessment Impaired Vision: No Impaired Hearing: No Decreased Hand dexterity: No Knowledge/Comprehension Assessment Knowledge Level: Medium Comprehension Level: Medium Ability to understand written Medium instructions: Ability to understand verbal Medium instructions: Motivation Assessment Anxiety Level: Calm Cooperation: Cooperative Education Importance: Acknowledges Need Interest in Health Problems: Asks Questions Perception: Coherent Willingness to Engage in Self- Medium Management Activities: Medium [name], [name] ([id number]) Readiness to Engage in Self- Management Activities: Electronic Signature(s) Signed: 01/30/2017 5:29:48 PM By: Montey Hora Entered By: Montey Hora on 01/30/2017 13:57:50 Zoss, Raul Del  ([id number]) -------------------------------------------------------------------------------- Fall Risk Assessment Details Patient Name: [name], [name] Date of Service: 01/30/2017 1:30 PM Medical Record Patient Account Number: [id number] Number: Treating RN: Montey Hora May 28, 1941 (75 y.o. Other Clinician: Date of Birth/Sex: Female) Treating ROBSON, MICHAEL Primary Care Armanie Ullmer: Frazier Richards Linzy Laury/Extender: G Referring Nixie Laube: Jenelle Mages in Treatment: 0 Fall Risk Assessment Items Have you had 2 or more falls in the last 12 monthso 0 No Have you had any fall that resulted in injury in the last 12 monthso 0 Yes FALL RISK ASSESSMENT: History of falling - immediate or within 3 months 0 No Secondary  diagnosis 0 No Ambulatory aid None/bed rest/wheelchair/nurse 0 Yes Crutches/cane/walker 0 No Furniture 0 No IV Access/Saline Lock 0 No Gait/Training Normal/bed rest/immobile 0 Yes Weak 0 No Impaired 0 No Mental Status Oriented to own ability 0 Yes Electronic Signature(s) Signed: 01/30/2017 5:29:48 PM By: Montey Hora Entered By: Montey Hora on 01/30/2017 13:58:04 Hussein, Raul Del ([id number]) -------------------------------------------------------------------------------- Nutrition Risk Assessment Details Patient Name: [name], [name] Date of Service: 01/30/2017 1:30 PM Medical Record Patient Account Number: [id number] Number: Treating RN: Montey Hora 1941-05-01 (75 y.o. Other Clinician: Date of Birth/Sex: Female) Treating ROBSON, MICHAEL Primary Care Kia Varnadore: Frazier Richards Khrystian Schauf/Extender: G Referring Milt Coye: Jenelle Mages in Treatment: 0 Height (in): Weight (lbs): Body Mass Index (BMI): Nutrition Risk Assessment Items NUTRITION RISK SCREEN: I have an illness or condition that made me change the kind and/or 0 No amount of food I eat I eat fewer than two meals per day 0 No I eat few fruits and  vegetables, or milk products 0 No I have three or more drinks of beer, liquor or wine almost every day 0 No I have tooth or mouth problems that make it hard for me to eat 0 No I don't always have enough money to buy the food I need 0 No I eat alone most of the time 0 No I take three or more different prescribed or over-the-counter drugs a 1 Yes day Without wanting to, I have lost or gained 10 pounds in the last six 0 No months I am not always physically able to shop, cook and/or feed myself 0 No Nutrition Protocols Good Risk Protocol 0 No interventions needed Moderate Risk Protocol Electronic Signature(s) Signed: 01/30/2017 5:29:48 PM By: Montey Hora Entered By: Montey Hora on 01/30/2017 13:58:11

## 2017-01-31 NOTE — Progress Notes (Signed)
GENIYAH, EISCHEID ([id number]) Visit Report for 01/30/2017 Chief Complaint Document Details Patient Name: [name], [name] Date of Service: 01/30/2017 1:30 PM Medical Record Patient Account Number: [id number] Number: Treating RN: Montey Hora [birth year] (76 y.o. Other Clinician: Date of Birth/Sex: Female) Treating Daiwik Buffalo Primary Care Provider: Frazier Richards Provider/Extender: G Referring Provider: Jenelle Mages in Treatment: 0 Information Obtained from: Patient Chief Complaint 01/05/17; this is a 76 year old disabled woman from [facility] who is here for review of multiple wounds. She is accompanied by her son Engineer, maintenance) Signed: 01/30/2017 4:24:49 PM By: Linton Ham MD Entered By: Linton Ham on 01/30/2017 14:49:00 Nerio, Raul Del ([id number]) -------------------------------------------------------------------------------- HPI Details Patient Name: [name], [name] Date of Service: 01/30/2017 1:30 PM Medical Record Patient Account Number: [id number] Number: Treating RN: Montey Hora 1941/04/19 (76 y.o. Other Clinician: Date of Birth/Sex: Female) Treating Shaquanna Lycan Primary Care Provider: Frazier Richards Provider/Extender: G Referring Provider: Jenelle Mages in Treatment: 0 History of Present Illness HPI Description: 12/26/16; this is a 76 year old woman who is from [facility] skilled facility. She is accompanied by her son who provides most of the history. He tells me he was called in early December to report a fall in the shower room at the facility. An x-ray showed a right supracondylar distal femur fracture. She was put in a brace and treated nonoperatively. She was also admitted to hospital in mid December and discharged on 12/18. During this admission she had sepsis secondary to an acute UTI. In reviewing [facility] she is also quoted during that admission as having a left ischial  tuberosity wound coccyx wound and a deep tissue injury over the left heel. Her son states that sometime after this and when she returned to see her orthopedic surgeon she developed wound areas under the brace on the right lateral leg have both distally and a smaller wound superiorly on the lateral right leg. I am not completely certain what the facility is placing on these wounds. The patient has a history of atrial fibrillation. Her son tells me that years ago she had a fairly significant stroke but maintained a good quality of life up until 2 years ago when she developed dementia then things have really been going downhill since. She is been at a nursing facility for the last 18 months [[facility]] barely her nutritional intake is marginal but improving. Her son states that he is there and a lot of meals and she eats 75%. She is receiving nutritional supplements as well as protein supplements she has a level II pressure-relief surface and bunny boots bilaterally on both feet 01/02/17; patient returns this week. Not much change. I crosshatched the left heel last week they have been applying Santyl. The extensive areas on the right lateral leg especially distally with a large area of exposed tendon we have been applying silver collagen. To her coccyx wound also silver collagen. According to her son who visits her daily and participates in her care her intake is fairly good she drinks well takes her nutritional supplements. She is at [facility] skilled facility. 01/30/17; this is a patient I had seen 2 times last month. She was admitted to hospital from 2/25 through 2/28 with urosepsis and a seizure. She is at [facility]. The son has hospice services there but he uses them more just to add care to his mother rather than for any end-of-life mentality. We therefore of maintained a fairly aggressive posture to order wounds. This is  in contradistinction to the last part of her discharge  summary in February in which the discharging doctor as a note in bold font expressing that this patient is under hospice care and should be treated as such. I've never found that to be what her son is actually saying in any case she has an unstageable eschar over the left heel, a large wound on the right calf with extensive exposed tendon and surrounding granulation, a smaller wound on the upper right calf which appears improved and a stage III sacral pressure ulcer which is also improved since the last time I saw her DALISHA, SHIVELY ([id number]) Electronic Signature(s) Signed: 01/30/2017 4:24:49 PM By: Linton Ham MD Entered By: Linton Ham on 01/30/2017 14:51:15 Rayle, Raul Del ([id number]) -------------------------------------------------------------------------------- Physical Exam Details Patient Name: [name], [name] Date of Service: 01/30/2017 1:30 PM Medical Record Patient Account Number: [id number] Number: Treating RN: Montey Hora 01-28-1941 (76 y.o. Other Clinician: Date of Birth/Sex: Female) Treating Dhriti Fales Primary Care Provider: Frazier Richards Provider/Extender: G Referring Provider: Jenelle Mages in Treatment: 0 Constitutional Sitting or standing Blood Pressure is within target range for patient.. Pulse regular and within target range for patient.Marland Kitchen Respirations regular, non-labored and within target range.. Temperature is normal and within the target range for the patient.. Patient's appearance is neat and clean. Appears in no acute distress. Well nourished and well developed.. Cardiovascular Her dorsalis pedis pulses are palpable bilaterally but not robust. Gastrointestinal (GI) Abdomen is soft and non-distended without masses or tenderness. Bowel sounds active in all quadrants.. No liver or spleen enlargement or tenderness.. Lymphatic None palpable in the popliteal or inguinal area. Psychiatric No major  changes. Notes Wound exam; oThe patient still has a thick black eschar over her left heel however this is a lot softer than the last time I saw her. oRight lateral leg wounds o2 the lower one has exposed tendon and the upper one is actually healthy oStage III pressure ulcer over her sacrum seems to have healthy granulation no debridement required. Electronic Signature(s) Signed: 01/30/2017 4:24:49 PM By: Linton Ham MD Entered By: Linton Ham on 01/30/2017 14:56:32 Bonnin, Raul Del ([id number]) -------------------------------------------------------------------------------- Physician Orders Details Patient Name: Baum, Lashayla B. Date of Service: 01/30/2017 1:30 PM Medical Record Patient Account Number: [id number] Number: Treating RN: Montey Hora May 09, 1941 (75 y.o. Other Clinician: Date of Birth/Sex: Female) Treating Genifer Lazenby Primary Care Provider: Frazier Richards Provider/Extender: G Referring Provider: Jenelle Mages in Treatment: 0 Verbal / Phone Orders: No Diagnosis Coding Wound Cleansing Wound #5 Sacrum o May shower with protection. - may cleanse all wounds in the shower except wound with tendon showing. Please protect that wound in the shower Wound #6 Left Calcaneus o May shower with protection. - may cleanse all wounds in the shower except wound with tendon showing. Please protect that wound in the shower Wound #7 Right,Proximal,Lateral Lower Leg o May shower with protection. - may cleanse all wounds in the shower except wound with tendon showing. Please protect that wound in the shower Wound #8 Right,Distal,Lateral Lower Leg o May shower with protection. - may cleanse all wounds in the shower except wound with tendon showing. Please protect that wound in the shower Anesthetic Wound #5 Sacrum o Topical Lidocaine 4% cream applied to wound bed prior to debridement Wound #6 Left Calcaneus o Topical Lidocaine 4% cream  applied to wound bed prior to debridement Wound #7 Right,Proximal,Lateral Lower Leg o Topical Lidocaine 4% cream applied to wound bed prior to debridement Wound #  8 Right,Distal,Lateral Lower Leg o Topical Lidocaine 4% cream applied to wound bed prior to debridement Primary Wound Dressing Wound #5 Sacrum o Prisma Ag Wound #7 Right,Proximal,Lateral Lower Leg Lorincz, Neola B. ([id number]) o Prisma Ag Wound #8 Right,Distal,Lateral Lower Leg o Prisma Ag Wound #6 Left Calcaneus o Santyl Ointment Secondary Dressing Wound #5 Sacrum o Dry Gauze o Boardered Foam Dressing Wound #6 Left Calcaneus o ABD and Kerlix/Conform Wound #7 Right,Proximal,Lateral Lower Leg o ABD and Kerlix/Conform Wound #8 Right,Distal,Lateral Lower Leg o ABD and Kerlix/Conform Dressing Change Frequency Wound #5 Sacrum o Change dressing every other day. Wound #6 Left Calcaneus o Change dressing every other day. Wound #7 Right,Proximal,Lateral Lower Leg o Change dressing every day. Wound #8 Right,Distal,Lateral Lower Leg o Change dressing every day. Follow-up Appointments Wound #5 Sacrum o Return Appointment in 2 weeks. Wound #6 Left Calcaneus o Return Appointment in 2 weeks. Wound #7 Right,Proximal,Lateral Lower Leg o Return Appointment in 2 weeks. Wound #8 Right,Distal,Lateral Lower Leg o Return Appointment in 2 weeks. Frohlich, Larin B. ([id number]) Edema Control Wound #7 Right,Proximal,Lateral Lower Leg o Kerlix and Coban - Right Lower Extremity - wrap from toes to 3cm below the knee Wound #8 Right,Distal,Lateral Lower Leg o Kerlix and Coban - Right Lower Extremity - wrap from toes to 3cm below the knee Additional Orders / Instructions Wound #5 Sacrum o Increase protein intake. Wound #6 Left Calcaneus o Increase protein intake. Wound #7 Right,Proximal,Lateral Lower Leg o Increase protein intake. Wound #8 Right,Distal,Lateral Lower Leg o Increase protein  intake. Electronic Signature(s) Signed: 01/30/2017 4:24:49 PM By: Linton Ham MD Signed: 01/30/2017 5:29:48 PM By: Montey Hora Entered By: Montey Hora on 01/30/2017 14:34:22 Albea, Raul Del ([id number]) -------------------------------------------------------------------------------- Problem List Details Patient Name: [name], [name] Date of Service: 01/30/2017 1:30 PM Medical Record Patient Account Number: [id number] Number: Treating RN: Montey Hora 1941/09/04 (75 y.o. 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 Date of Service: 01/30/2017 1:30 PM Medical Record Patient Account Number: [id number] Number: Treating RN: Montey Hora [birth year] (75 y.o. Other Clinician: Date of Birth/Sex: Female) Treating Averey Koning Primary Care Provider: Frazier Richards Provider/Extender: G Referring Provider: Jenelle Mages in Treatment: 0 Subjective Chief Complaint Information obtained from Patient 01/05/17; this is a 75 year old disabled woman from [facility] who is here for review of multiple wounds. She is accompanied by her son History of Present Illness (HPI) 12/26/16; this is a  76 year old woman who is from [facility] skilled facility. She is accompanied by her son who provides most of the history. He tells me he was called in early December to report a fall in the shower room at the facility. An x-ray showed a right supracondylar distal femur fracture. She was put in a brace and treated nonoperatively. She was also admitted to hospital in mid December and discharged on 12/18. During this admission she had sepsis secondary to an acute UTI. In reviewing [facility] she is also quoted during that admission  as having a left ischial tuberosity wound coccyx wound and a deep tissue injury over the left heel. Her son states that sometime after this and when she returned to see her orthopedic surgeon she developed wound areas under the brace on the right lateral leg have both distally and a smaller wound superiorly on the lateral right leg. I am not completely certain what the facility is placing on these wounds. The patient has a history of atrial fibrillation. Her son tells me that years ago she had a fairly significant stroke but maintained a good quality of life up until 2 years ago when she developed dementia then things have really been going downhill since. She is been at a nursing facility for the last 18 months [[facility]] barely her nutritional intake is marginal but improving. Her son states that he is there and a lot of meals and she eats 75%. She is receiving nutritional supplements as well as protein supplements she has a level II pressure-relief surface and bunny boots bilaterally on both feet 01/02/17; patient returns this week. Not much change. I crosshatched the left heel last week they have been applying Santyl. The extensive areas on the right lateral leg especially distally with a large area of exposed tendon we have been applying silver collagen. To her coccyx wound also silver collagen. According to her son who visits her daily and  participates in her care her intake is fairly good she drinks well takes her nutritional supplements. She is at [facility] skilled facility. 01/30/17; this is a patient I had seen 2 times last month. She was admitted to hospital from 2/25 through 2/28 with urosepsis and a seizure. She is at [facility]. The son has hospice services there but he uses them more just to add care to his mother rather than for any end-of-life mentality. We therefore of maintained a fairly aggressive posture to order wounds. This is in contradistinction to the last part of her discharge summary in February in which the discharging doctor as a note in bold font expressing that this Eisman, Glendine B. ([id number]) patient is under hospice care and should be treated as such. I've never found that to be what her son is actually saying in any case she has an unstageable eschar over the left heel, a large wound on the right calf with extensive exposed tendon and surrounding granulation, a smaller wound on the upper right calf which appears improved and a stage III sacral pressure ulcer which is also improved since the last time I saw her Wound History Patient presents with 4 open wounds that have been present for approximately December 2017. Patient has been treating wounds in the following manner: prisma ag. Laboratory tests have been performed in the last month. Patient reportedly has not tested positive for an antibiotic resistant organism. Patient reportedly has not tested positive for osteomyelitis. Patient reportedly has not had testing performed to evaluate circulation in the legs. Patient History Information obtained from Patient. Allergies No Known Allergies Social History Never smoker, Marital Status - Widowed, Alcohol Use - Never, Drug Use - No History, Caffeine Use - Never. Medical History Hospitalization/Surgery History - 10/31/2016, [facility], sepsis UTI. Medical And Surgical History  Notes Cardiovascular hyperlipidemia, CVA 216 Gastrointestinal GERD Genitourinary recurrent uti, colovaginal fistula Objective Constitutional Sitting or standing Blood Pressure is within target range for patient.. Pulse regular and within target range for patient.Marland Kitchen Respirations regular, non-labored and within target range.. Temperature is normal and within Probus, Aryonna B. ([id number])  the target range for the patient.. Patient's appearance is neat and clean. Appears in no acute distress. Well nourished and well developed.. Vitals Time Taken: 1:35 PM, Temperature: 97.7 F, Pulse: 103 bpm, Respiratory Rate: 16 breaths/min, Blood Pressure: 134/78 mmHg. Cardiovascular Her dorsalis pedis pulses are palpable bilaterally but not robust. Gastrointestinal (GI) Abdomen is soft and non-distended without masses or tenderness. Bowel sounds active in all quadrants.. No liver or spleen enlargement or tenderness.. Lymphatic None palpable in the popliteal or inguinal area. Psychiatric No major changes. General Notes: Wound exam; The patient still has a thick black eschar over her left heel however this is a lot softer than the last time I saw her. Right lateral leg wounds o2 the lower one has exposed tendon and the upper one is actually healthy Stage III pressure ulcer over her sacrum seems to have healthy granulation no debridement required. Integumentary (Hair, Skin) Wound #5 status is Open. Original cause of wound was Pressure Injury. The wound is located on the Sacrum. The wound measures 2.5cm length x 2.7cm width x 0.1cm depth; 5.301cm^2 area and 0.53cm^3 volume. The wound is limited to skin breakdown. There is no tunneling or undermining noted. There is a large amount of serous drainage noted. The wound margin is flat and intact. There is large (67-100%) pink granulation within the wound bed. There is a small (1-33%) amount of necrotic tissue within the wound bed including Adherent Slough.  The periwound skin appearance did not exhibit: Callus, Crepitus, Excoriation, Induration, Rash, Scarring, Dry/Scaly, Maceration, Atrophie Blanche, Cyanosis, Ecchymosis, Hemosiderin Staining, Mottled, Pallor, Rubor, Erythema. The periwound has tenderness on palpation. Wound #6 status is Open. Original cause of wound was Pressure Injury. The wound is located on the Left Calcaneus. The wound measures 3.4cm length x 5cm width x 0.2cm depth; 13.352cm^2 area and 2.67cm^3 volume. The wound is limited to skin breakdown. There is no tunneling or undermining noted. There is a large amount of serous drainage noted. The wound margin is flat and intact. There is no granulation within the wound bed. There is a large (67-100%) amount of necrotic tissue within the wound bed including Eschar. The periwound skin appearance did not exhibit: Callus, Crepitus, Excoriation, Induration, Rash, Scarring, Dry/Scaly, Maceration, Atrophie Blanche, Cyanosis, Ecchymosis, Hemosiderin Staining, Mottled, Pallor, Rubor, Erythema. The periwound has tenderness on palpation. Wound #7 status is Open. Original cause of wound was Pressure Injury. The wound is located on the Right,Proximal,Lateral Lower Leg. The wound measures 2cm length x 1.4cm width x 0.1cm depth; 2.199cm^2 area and 0.22cm^3 volume. The wound is limited to skin breakdown. There is no tunneling or undermining noted. There is a large amount of serous drainage noted. The wound margin is flat and intact. There is large (67-100%) pink granulation within the wound bed. There is a small (1-33%) amount of necrotic tissue within the wound bed including Adherent Slough. The periwound skin appearance did not Nou, Nyonna B. ([id number]) exhibit: Callus, Crepitus, Excoriation, Induration, Rash, Scarring, Dry/Scaly, Maceration, Atrophie Blanche, Cyanosis, Ecchymosis, Hemosiderin Staining, Mottled, Pallor, Rubor, Erythema. Periwound temperature was noted as No Abnormality. The  periwound has tenderness on palpation. Wound #8 status is Open. Original cause of wound was Pressure Injury. The wound is located on the Right,Distal,Lateral Lower Leg. The wound measures 12.5cm length x 3.3cm width x 0.4cm depth; 32.398cm^2 area and 12.959cm^3 volume. There is tendon exposed. There is no tunneling or undermining noted. There is a large amount of serous drainage noted. The wound margin is flat and intact. There is small (  1-33%) pink granulation within the wound bed. There is a large (67-100%) amount of necrotic tissue within the wound bed including Eschar. The periwound skin appearance did not exhibit: Callus, Crepitus, Excoriation, Induration, Rash, Scarring, Dry/Scaly, Maceration, Atrophie Blanche, Cyanosis, Ecchymosis, Hemosiderin Staining, Mottled, Pallor, Rubor, Erythema. Periwound temperature was noted as No Abnormality. The periwound has tenderness on palpation. 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 - may cleanse all wounds in the shower except wound with tendon showing. Please protect that wound in the shower Wound #6 Left Calcaneus: May shower with protection. - may cleanse all wounds in the shower except wound with tendon showing. Please protect that wound in the shower Wound #7 Right,Proximal,Lateral Lower Leg: May shower with protection. - may cleanse all wounds in the shower except wound with tendon showing. Please protect that wound in the shower Wound #8 Right,Distal,Lateral Lower Leg: May shower with protection. - may cleanse all wounds in the shower except wound with tendon showing. Please protect that wound in the shower Anesthetic: Wound #5 Sacrum: Topical Lidocaine 4% cream applied to wound bed prior to  debridement Wound #6 Left Calcaneus: Desilets, Beatryce B. ([id number]) Topical Lidocaine 4% cream applied to wound bed prior to debridement Wound #7 Right,Proximal,Lateral Lower Leg: Topical Lidocaine 4% cream applied to wound bed prior to debridement Wound #8 Right,Distal,Lateral Lower Leg: Topical Lidocaine 4% cream applied to wound bed prior to debridement Primary Wound Dressing: Wound #5 Sacrum: Prisma Ag Wound #7 Right,Proximal,Lateral Lower Leg: Prisma Ag Wound #8 Right,Distal,Lateral Lower Leg: Prisma Ag Wound #6 Left Calcaneus: Santyl Ointment Secondary Dressing: Wound #5 Sacrum: Dry Gauze Boardered Foam Dressing Wound #6 Left Calcaneus: ABD and Kerlix/Conform Wound #7 Right,Proximal,Lateral Lower Leg: ABD and Kerlix/Conform Wound #8 Right,Distal,Lateral Lower Leg: ABD and Kerlix/Conform Dressing Change Frequency: Wound #5 Sacrum: Change dressing every other day. Wound #6 Left Calcaneus: Change dressing every other day. Wound #7 Right,Proximal,Lateral Lower Leg: Change dressing every day. Wound #8 Right,Distal,Lateral Lower Leg: Change dressing every day. Follow-up Appointments: Wound #5 Sacrum: Return Appointment in 2 weeks. Wound #6 Left Calcaneus: Return Appointment in 2 weeks. Wound #7 Right,Proximal,Lateral Lower Leg: Return Appointment in 2 weeks. Wound #8 Right,Distal,Lateral Lower Leg: Return Appointment in 2 weeks. Edema Control: Wound #7 Right,Proximal,Lateral Lower Leg: Kerlix and Coban - Right Lower Extremity - wrap from toes to 3cm below the knee Wound #8 Right,Distal,Lateral Lower Leg: Kerlix and Coban - Right Lower Extremity - wrap from toes to 3cm below the knee Additional Orders / Instructions: Wound #5 Sacrum: Increase protein intake. Wound #6 Left Calcaneus: Badami, Tahlor B. ([id number]) Increase protein intake. Wound #7 Right,Proximal,Lateral Lower Leg: Increase protein intake. Wound #8 Right,Distal,Lateral Lower Leg: Increase protein  intake. #1 we continue Santyl to the left heel, at some point I will debrided this to remove the eschar #2 right lateral leg wound will continue with Prisma. It might be possible to stimulate enough granulation over the tendon if we used Apligraf for instance. #3 area on the upper right lateral leg looks healthy we will continue to use Prisma here #4 sacral wound looks healthy still Prisma I spent some time talking to the son about his interpretation of what hospice means in this situation. For him this is nothing more than another pair of trained eyes to watch his mother in  the nursing home to make sure that her care is being done properly. I don't believe this has any of the traditional end-of-life meaning. Is fairly clear from reading her hospital records that the doctors look after her there don't understand this. Electronic Signature(s) Signed: 01/30/2017 4:24:49 PM By: Linton Ham MD Entered By: Linton Ham on 01/30/2017 14:58:44 Mcquiston, Raul Del ([id number]) -------------------------------------------------------------------------------- ROS/PFSH Details Patient Name: [name], [name] Date of Service: 01/30/2017 1:30 PM Medical Record Patient Account Number: [id number] Number: Treating RN: Montey Hora 1941/10/26 (75 y.o. Other Clinician: Date of Birth/Sex: Female) Treating Selyna Klahn, [place] Primary Care Provider: Frazier Richards Provider/Extender: G Referring Provider: Jenelle Mages in Treatment: 0 Information Obtained From Patient Wound History Do you currently have one or more open woundso Yes How many open wounds do you currently haveo 4 Approximately how long have you had your woundso December 2017 How have you been treating your wound(s) until nowo prisma ag Has your wound(s) ever healed and then re-openedo No Have you had any lab work done in the past montho Yes Who ordered the lab work [facility] Have you tested positive for an antibiotic  resistant organism (MRSA, VRE)o No Have you tested positive for osteomyelitis (bone infection)o No Have you had any tests for circulation on your legso No Eyes Medical History: Positive for: Cataracts Cardiovascular Medical History: Positive for: Arrhythmia - a fib Past Medical History Notes: hyperlipidemia, CVA 216 Gastrointestinal Medical History: Past Medical History Notes: GERD Genitourinary Medical History: Past Medical History Notes: recurrent uti, colovaginal fistula Integumentary (Skin) Bryden, Marinna B. ([id number]) Medical History: Positive for: History of pressure wounds Musculoskeletal Medical History: Positive for: Gout Neurologic Medical History: Positive for: Seizure Disorder - hx seizure Oncologic Medical History: Negative for: Received Chemotherapy; Received Radiation HBO Extended History Items Eyes: Cataracts Immunizations Pneumococcal Vaccine: Received Pneumococcal Vaccination: Yes Immunization Notes: up to date Hospitalization / Surgery History Name of Hospital Purpose of Hospitalization/Surgery Date [facility] sepsis UTI 10/31/2016 Family and Social History Never smoker; Marital Status - Widowed; Alcohol Use: Never; Drug Use: No History; Caffeine Use: Never; Financial Concerns: No; Food, Clothing or Shelter Needs: No; Support System Lacking: No; Transportation Concerns: No; Medical Power of Attorney: Yes - Gala Lewandowsky (Not Provided) Electronic Signature(s) Signed: 01/30/2017 4:24:49 PM By: Linton Ham MD Signed: 01/30/2017 5:29:48 PM By: Montey Hora Entered By: Montey Hora on 01/30/2017 13:59:02 Osterberg, Raul Del ([id number]) -------------------------------------------------------------------------------- [facility] Details Patient Name: [name], [name] Date of Service: 01/30/2017 Medical Record Patient Account Number: [id number] Number: Treating RN: Montey Hora [birth year] (75 y.o. Other Clinician: Date of Birth/Sex: Female)  Treating Saydi Kobel, [place] Primary Care Provider: Frazier Richards Provider/Extender: G Referring Provider: Frazier Richards Service Line: Outpatient Weeks in Treatment: 0 Diagnosis Coding ICD-10 Codes Code Description L89.153 Pressure ulcer of sacral region, stage 3 L89.620 Pressure ulcer of left heel, unstageable L97.213 Non-pressure chronic ulcer of right calf with necrosis of muscle Facility Procedures CPT4 Code: [id number] Description: 38466 - WOUND CARE VISIT-LEV 5 EST PT Modifier: Quantity: 1 Physician Procedures CPT4 Code Description: [id number] [id number] - WC PHYS LEVEL 3 - EST PT ICD-10 Description Diagnosis 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

## 2017-02-05 ENCOUNTER — Ambulatory Visit: Payer: Medicare Other | Admitting: Internal Medicine

## 2017-02-13 ENCOUNTER — Encounter: Admitting: Internal Medicine

## 2017-02-13 DIAGNOSIS — L89153 Pressure ulcer of sacral region, stage 3: Secondary | ICD-10-CM | POA: Diagnosis not present

## 2017-02-15 NOTE — Progress Notes (Signed)
[name], [name] ([id number]) Visit Report for 02/13/2017 Arrival Information Details Patient Name: [name], [name] Date of Service: 02/13/2017 1:30 PM Medical Record Patient Account Number: [id number] Number: Treating RN: Montey Hora 1941/03/30 (75 y.o. Other Clinician: Date of Birth/Sex: Female) Treating ROBSON, MICHAEL Primary Care Wylma Tatem: Frazier Richards Kassiah Mccrory/Extender: G Referring Syriah Delisi: Jenelle Mages in Treatment: 2 Visit Information History Since Last Visit Added or deleted any medications: No Patient Arrived: Wheel Chair Any new allergies or adverse reactions: No Arrival Time: 13:18 Had a fall or experienced change in No activities of daily living that may affect Accompanied By: son risk of falls: Transfer Assistance: [employer] Signs or symptoms of abuse/neglect since last No Patient Identification Verified: Yes visito Secondary Verification Process Yes Hospitalized since last visit: No Completed: Has Dressing in Place as Prescribed: Yes Pain Present Now: No Electronic Signature(s) Signed: 02/13/2017 5:35:11 PM By: Montey Hora Entered By: Montey Hora on 02/13/2017 13:19:08 [name], [name] ([id number]) -------------------------------------------------------------------------------- Encounter Discharge Information Details Patient Name: [name], [name] Date of Service: 02/13/2017 1:30 PM Medical Record Patient Account Number: [id number] Number: Treating RN: Montey Hora [birth year] (75 y.o. Other Clinician: Date of Birth/Sex: Female) Treating ROBSON, MICHAEL Primary Care Ayuub Penley: Frazier Richards Kymber Kosar/Extender: G Referring Samiya Mervin: Jenelle Mages in Treatment: 2 Encounter Discharge Information Items Discharge Pain Level: 0 Discharge Condition: Stable Ambulatory Status: Wheelchair Discharge Destination: Nursing Home Transportation: Private Auto Accompanied By: son Schedule Follow-up Appointment:  Yes Medication Reconciliation completed and provided to Patient/Care No Lynnleigh Soden: Provided on Clinical Summary of Care: 02/13/2017 Form Type Recipient Paper Patient EM Electronic Signature(s) Signed: 02/13/2017 2:39:59 PM By: Ruthine Dose Entered By: Ruthine Dose on 02/13/2017 14:39:59 [name] ([id number]) -------------------------------------------------------------------------------- Lower Extremity Assessment Details Patient Name: [name], [name] Date of Service: 02/13/2017 1:30 PM Medical Record Patient Account Number: [id number] Number: Treating RN: Montey Hora Dec 01, 1940 (75 y.o. Other Clinician: Date of Birth/Sex: Female) Treating ROBSON, MICHAEL Primary Care Kobe Ofallon: Frazier Richards Kristjan Derner/Extender: G Referring Ayrabella Labombard: Jenelle Mages in Treatment: 2 Vascular Assessment Pulses: Dorsalis Pedis Palpable: [Left:Yes] [Right:Yes] Posterior Tibial Extremity colors, hair growth, and conditions: Extremity Color: [Left:Hyperpigmented] [Right:Hyperpigmented] Hair Growth on Extremity: [Left:No] [Right:No] Temperature of Extremity: [Left:Warm] [Right:Warm] Capillary Refill: [Left:< 3 seconds] [Right:< 3 seconds] Electronic Signature(s) Signed: 02/13/2017 5:35:11 PM By: Montey Hora Entered By: Montey Hora on 02/13/2017 13:52:21 [name] ([id number]) -------------------------------------------------------------------------------- Multi Wound Chart Details Patient Name: [name], [name] Date of Service: 02/13/2017 1:30 PM Medical Record Patient Account Number: [id number] Number: Treating RN: Montey Hora 10-31-1941 (75 y.o. Other Clinician: Date of Birth/Sex: Female) Treating ROBSON, MICHAEL Primary Care Desmon Hitchner: Frazier Richards Mozella Rexrode/Extender: G Referring Grethel Zenk: Jenelle Mages in Treatment: 2 Vital Signs Height(in): Pulse(bpm): 85 Weight(lbs): Blood Pressure 106/66 (mmHg): Body Mass  Index(BMI): Temperature(F): 98.4 Respiratory Rate 16 (breaths/min): Photos: [5:No Photos] [6:No Photos] [7:No Photos] Wound Location: [5:Sacrum] [6:Left Calcaneus] [7:Right Lower Leg - Lateral, Proximal] Wounding Event: [5:Pressure Injury] [6:Pressure Injury] [7:Pressure Injury] Primary Etiology: [5:Pressure Ulcer] [6:Pressure Ulcer] [7:Pressure Ulcer] Comorbid History: [5:Cataracts, Arrhythmia, History of pressure wounds, Gout, Seizure Disorder] [6:Cataracts, Arrhythmia, History of pressure wounds, Gout, Seizure Disorder] [7:Cataracts, Arrhythmia, History of pressure wounds, Gout, Seizure Disorder] Date Acquired: [5:10/22/2016] [6:10/22/2016] [7:10/22/2016] Weeks of Treatment: [5:2] [6:2] [7:2] Wound Status: [5:Open] [6:Open] [7:Open] Measurements L x W x D 1.7x2.3x0.1 [6:3.5x5.2x0.2] [7:0.3x0.3x0.1] (cm) Area (cm) : [5:3.071] [6:14.294] [7:0.071] Volume (cm) : [5:0.307] [6:2.859] [7:0.007] % Reduction in Area: [5:42.10%] [6:-7.10%] [7:96.80%] % Reduction in Volume: 42.10% [6:-7.10%] [7:96.80%] Classification: [5:Category/Stage II] [6:Unstageable/Unclassified] [  7:Category/Stage II] Exudate Amount: [5:Large] [6:Large] [7:Large] Exudate Type: [5:Serous] [6:Serous] [7:Serous] Exudate Color: [5:amber] [6:amber] [7:amber] Wound Margin: [5:Flat and Intact] [6:Flat and Intact] [7:Flat and Intact] Granulation Amount: [5:Large (67-100%)] [6:None Present (0%)] [7:Large (67-100%)] Granulation Quality: [5:Pink] [6:N/A] [7:Pink] Necrotic Amount: [5:None Present (0%)] [6:Large (67-100%)] [7:Small (1-33%)] Necrotic Tissue: [5:N/A] [6:Eschar] [7:Adherent Slough] Exposed Structures: [name], [name] ([id number]) Fascia: No Fascia: No Fascia: No Fat Layer (Subcutaneous Fat Layer (Subcutaneous Fat Layer (Subcutaneous Tissue) Exposed: No Tissue) Exposed: No Tissue) Exposed: No Tendon: No Tendon: No Tendon: No Muscle: No Muscle: No Muscle: No Joint: No Joint: No Joint: No Bone: No Bone:  No Bone: No Limited to Skin Limited to Skin Limited to Skin Breakdown Breakdown Breakdown Epithelialization: None None None Debridement: N/A Debridement ([id number]- N/A 11047) Pre-procedure N/A 13:58 N/A Verification/Time Out Taken: Pain Control: N/A Lidocaine 4% Topical N/A Solution Tissue Debrided: N/A Necrotic/Eschar, N/A Fibrin/Slough, Muscle, Subcutaneous Level: N/A Skin/Subcutaneous N/A Tissue/Muscle Debridement Area (sq N/A 18.2 N/A cm): Instrument: N/A Blade, Curette, Forceps N/A Bleeding: N/A Moderate N/A Hemostasis Achieved: N/A Pressure N/A Procedural Pain: N/A 0 N/A Post Procedural Pain: N/A 0 N/A Debridement Treatment N/A Procedure was tolerated N/A Response: well Post Debridement N/A 3.5x5.2x0.4 N/A Measurements L x W x D (cm) Post Debridement N/A 5.718 N/A Volume: (cm) Post Debridement N/A Unstageable/Unclassified N/A Stage: Periwound Skin Texture: Excoriation: No Excoriation: No Excoriation: No Induration: No Induration: No Induration: No Callus: No Callus: No Callus: No Crepitus: No Crepitus: No Crepitus: No Rash: No Rash: No Rash: No Scarring: No Scarring: No Scarring: No Periwound Skin Maceration: No Maceration: No Maceration: No Moisture: Dry/Scaly: No Dry/Scaly: No Dry/Scaly: No Periwound Skin Color: Atrophie Blanche: No Atrophie Blanche: No Atrophie Blanche: No Cyanosis: No Cyanosis: No Cyanosis: No Ecchymosis: No Ecchymosis: No Ecchymosis: No Erythema: No Erythema: No Erythema: No Stare, Suhani B. ([id number]) Hemosiderin Staining: No Hemosiderin Staining: No Hemosiderin Staining: No Mottled: No Mottled: No Mottled: No Pallor: No Pallor: No Pallor: No Rubor: No Rubor: No Rubor: No Temperature: N/A N/A No Abnormality Tenderness on Yes Yes Yes Palpation: Wound Preparation: Ulcer Cleansing: Ulcer Cleansing: Ulcer Cleansing: Rinsed/Irrigated with Rinsed/Irrigated with Rinsed/Irrigated with Saline Saline  Saline Topical Anesthetic Topical Anesthetic Topical Anesthetic Applied: Other: lidocaine Applied: Other: lidocaine Applied: Other: lidocaine 4% 4% 4% Procedures Performed: N/A Debridement N/A Wound Number: 8 N/A N/A Photos: No Photos N/A N/A Wound Location: Right Lower Leg - Lateral, N/A N/A Distal Wounding Event: Pressure Injury N/A N/A Primary Etiology: Pressure Ulcer N/A N/A Comorbid History: Cataracts, Arrhythmia, N/A N/A History of pressure wounds, Gout, Seizure Disorder Date Acquired: 10/22/2016 N/A N/A Weeks of Treatment: 2 N/A N/A Wound Status: Open N/A N/A Measurements L x W x D 12.3x2.4x0.4 N/A N/A (cm) Area (cm) : 23.185 N/A N/A Volume (cm) : 9.274 N/A N/A % Reduction in Area: 28.40% N/A N/A % Reduction in Volume: 28.40% N/A N/A Classification: Category/Stage IV N/A N/A Exudate Amount: Large N/A N/A Exudate Type: Serous N/A N/A Exudate Color: amber N/A N/A Wound Margin: Flat and Intact N/A N/A Granulation Amount: Small (1-33%) N/A N/A Granulation Quality: Pink N/A N/A Necrotic Amount: Large (67-100%) N/A N/A Necrotic Tissue: Eschar N/A N/A Exposed Structures: Tendon: Yes N/A N/A Fascia: No Fat Layer (Subcutaneous Tissue) Exposed: No Muscle: No Forrest, Anastassia B. ([id number]) Joint: No Bone: No Epithelialization: None N/A N/A Debridement: Debridement ([id number]- N/A N/A 11047) Pre-procedure 13:55 N/A N/A Verification/Time Out Taken: Pain Control: Lidocaine 4% Topical N/A N/A Solution Tissue Debrided: Necrotic/Eschar, Tendon, N/A N/A Subcutaneous Level: Skin/Subcutaneous  N/A N/A Tissue Debridement Area (sq 29.52 N/A N/A cm): Instrument: Curette, Scissors N/A N/A Bleeding: Moderate N/A N/A Hemostasis Achieved: Pressure N/A N/A Procedural Pain: 0 N/A N/A Post Procedural Pain: 0 N/A N/A Debridement Treatment Procedure was tolerated N/A N/A Response: well Post Debridement 12.3x2.4x0.4 N/A N/A Measurements L x W x D (cm) Post Debridement 9.274 N/A  N/A Volume: (cm) Post Debridement Category/Stage IV N/A N/A Stage: Periwound Skin Texture: Excoriation: No N/A N/A Induration: No Callus: No Crepitus: No Rash: No Scarring: No Periwound Skin Maceration: No N/A N/A Moisture: Dry/Scaly: No Periwound Skin Color: Atrophie Blanche: No N/A N/A Cyanosis: No Ecchymosis: No Erythema: No Hemosiderin Staining: No Mottled: No Pallor: No Rubor: No Temperature: No Abnormality N/A N/A Tenderness on Yes N/A N/A Palpation: Wound Preparation: N/A N/A Hardeman, Artesia B. ([id number]) Ulcer Cleansing: Rinsed/Irrigated with Saline Topical Anesthetic Applied: Other: lidocaine 4% Procedures Performed: Debridement N/A N/A Treatment Notes Electronic Signature(s) Signed: 02/14/2017 5:50:35 AM By: Linton Ham MD Entered By: Linton Ham on 02/13/2017 Derby, [name] ([id number]) -------------------------------------------------------------------------------- [place] Details Patient Name: Melikian, Rondell B. Date of Service: 02/13/2017 1:30 PM Medical Record Patient Account Number: [id number] Number: Treating RN: Montey Hora Jun 04, 1941 (75 y.o. Other Clinician: Date of Birth/Sex: Female) Treating ROBSON, MICHAEL Primary Care Morayo Leven: Frazier Richards Bionca Mckey/Extender: G Referring Hydie Langan: Jenelle Mages in Treatment: 2 Active Inactive ` Nutrition Nursing Diagnoses: Potential for alteratiion in Nutrition/Potential for imbalanced nutrition Goals: Patient/caregiver agrees to and verbalizes understanding of need to use nutritional supplements and/or vitamins as prescribed Date Initiated: 01/30/2017 Target Resolution Date: 04/25/2017 Goal Status: Active Interventions: Assess patient nutrition upon admission and as needed per policy Notes: ` Orientation to the Wound Care Program Nursing Diagnoses: Knowledge deficit related to the wound healing center  program Goals: Patient/caregiver will verbalize understanding of the [facility] Date Initiated: 01/30/2017 Target Resolution Date: 02/22/2017 Goal Status: Active Interventions: Provide education on orientation to the wound center Notes: ` Pressure Manna, Betania B. ([id number]) Nursing Diagnoses: Potential for impaired tissue integrity related to pressure, friction, moisture, and shear Goals: Patient will remain free from development of additional pressure ulcers Date Initiated: 01/30/2017 Target Resolution Date: 04/26/2017 Goal Status: Active Interventions: Assess: immobility, friction, shearing, incontinence upon admission and as needed Notes: ` Wound/Skin Impairment Nursing Diagnoses: Impaired tissue integrity Goals: Patient/caregiver will verbalize understanding of skin care regimen Date Initiated: 01/30/2017 Target Resolution Date: 04/26/2017 Goal Status: Active Ulcer/skin breakdown will have a volume reduction of 30% by week 4 Date Initiated: 01/30/2017 Target Resolution Date: 04/26/2017 Goal Status: Active Ulcer/skin breakdown will have a volume reduction of 50% by week 8 Date Initiated: 01/30/2017 Target Resolution Date: 04/26/2017 Goal Status: Active Ulcer/skin breakdown will have a volume reduction of 80% by week 12 Date Initiated: 01/30/2017 Target Resolution Date: 04/26/2017 Goal Status: Active Ulcer/skin breakdown will heal within 14 weeks Date Initiated: 01/30/2017 Target Resolution Date: 04/26/2017 Goal Status: Active Interventions: Assess patient/caregiver ability to obtain necessary supplies Assess patient/caregiver ability to perform ulcer/skin care regimen upon admission and as needed Assess ulceration(s) every visit Notes: Electronic Signature(s) Signed: 02/13/2017 5:35:11 PM By: Tacy Learn, [name] ([id number]) Entered By: Montey Hora on 02/13/2017 13:52:28 Waldorf, [name]  ([id number]) -------------------------------------------------------------------------------- Pain Assessment Details Patient Name: Ransier, Jillian B. Date of Service: 02/13/2017 1:30 PM Medical Record Patient Account Number: [id number] Number: Treating RN: Montey Hora Mar 27, 1941 (75 y.o. Other Clinician: Date of Birth/Sex: Female) Treating ROBSON, Taylor Primary Care Maryann Mccall: Frazier Richards Levorn Oleski/Extender: G Referring Elayjah Chaney: Frazier Richards  Weeks in Treatment: 2 Active Problems Location of Pain Severity and Description of Pain Patient Has Paino No Site Locations Pain Management and Medication Current Pain Management: Electronic Signature(s) Signed: 02/13/2017 5:35:11 PM By: Montey Hora Entered By: Montey Hora on 02/13/2017 13:19:17 Serratore, [name] ([id number]) -------------------------------------------------------------------------------- Patient/Caregiver Education Details Patient Name: Severa, Natsumi B. Date of Service: 02/13/2017 1:30 PM Medical Record Patient Account Number: [id number] Number: Treating RN: Montey Hora 1941-06-20 (75 y.o. Other Clinician: Date of Birth/Gender: Female) Treating ROBSON, [place] Primary Care Physician: Frazier Richards Physician/Extender: G Referring Physician: Jenelle Mages in Treatment: 2 Education Assessment Education Provided To: Caregiver Education Topics Provided Wound/Skin Impairment: Handouts: Other: wound care as ordered Methods: Explain/Verbal Responses: State content correctly Electronic Signature(s) Signed: 02/13/2017 5:35:11 PM By: Montey Hora Entered By: Montey Hora on 02/13/2017 13:53:42 Barber, [name] ([id number]) -------------------------------------------------------------------------------- Wound Assessment Details Patient Name: Barocio, Esteen B. Date of Service: 02/13/2017 1:30 PM Medical Record Patient Account Number:  [id number] Number: Treating RN: Montey Hora 05-13-1941 (75 y.o. Other Clinician: Date of Birth/Sex: Female) Treating ROBSON, MICHAEL Primary Care Davi Rotan: Frazier Richards Maverick Patman/Extender: G Referring Macayla Ekdahl: Jenelle Mages in Treatment: 2 Wound Status Wound Number: 5 Primary Pressure Ulcer Etiology: Wound Location: Sacrum Wound Open Wounding Event: Pressure Injury Status: Date Acquired: 10/22/2016 Comorbid Cataracts, Arrhythmia, History of Weeks Of Treatment: 2 History: pressure wounds, Gout, Seizure Clustered Wound: No Disorder Photos Photo Uploaded By: Montey Hora on 02/13/2017 16:10:47 Wound Measurements Length: (cm) 1.7 % Reduction in A Width: (cm) 2.3 % Reduction in V Depth: (cm) 0.1 Epithelializatio Area: (cm) 3.071 Tunneling: Volume: (cm) 0.307 Undermining: rea: 42.1% olume: 42.1% n: None No No Wound Description Classification: Category/Stage II Foul Odor After Wound Margin: Flat and Intact Slough/Fibrino Exudate Amount: Large Exudate Type: Serous Exudate Color: amber Cleansing: No Yes Wound Bed Granulation Amount: Large (67-100%) Exposed Structure Granulation Quality: Pink Fascia Exposed: No Schaus, Felissa B. ([id number]) Necrotic Amount: None Present (0%) Fat Layer (Subcutaneous Tissue) Exposed: No Tendon Exposed: No Muscle Exposed: No Joint Exposed: No Bone Exposed: No Limited to Skin Breakdown Periwound Skin Texture Texture Color No Abnormalities Noted: No No Abnormalities Noted: No Callus: No Atrophie Blanche: No Crepitus: No Cyanosis: No Excoriation: No Ecchymosis: No Induration: No Erythema: No Rash: No Hemosiderin Staining: No Scarring: No Mottled: No Pallor: No Moisture Rubor: No No Abnormalities Noted: No Dry / Scaly: No Temperature / Pain Maceration: No Tenderness on Palpation: Yes Wound Preparation Ulcer Cleansing: Rinsed/Irrigated with Saline Topical Anesthetic Applied: Other:  lidocaine 4%, Treatment Notes Wound #5 (Sacrum) 1. Cleansed with: Clean wound with Normal Saline 2. Anesthetic Topical Lidocaine 4% cream to wound bed prior to debridement 4. Dressing Applied: Prisma Ag 5. Secondary Dressing Applied Bordered Foam Dressing Electronic Signature(s) Signed: 02/13/2017 5:35:11 PM By: Montey Hora Entered By: Montey Hora on 02/13/2017 13:51:14 Cragin, [name] ([id number]) -------------------------------------------------------------------------------- Wound Assessment Details Patient Name: Mould, Wanita B. Date of Service: 02/13/2017 1:30 PM Medical Record Patient Account Number: [id number] Number: Treating RN: Montey Hora 1941-05-21 (75 y.o. Other Clinician: Date of Birth/Sex: Female) Treating ROBSON, MICHAEL Primary Care Miria Cappelli: Frazier Richards Toran Murch/Extender: G Referring Jermane Brayboy: Frazier Richards Weeks in Treatment: 2 Wound Status Wound Number: 6 Primary Pressure Ulcer Etiology: Wound Location: Left Calcaneus Wound Open Wounding Event: Pressure Injury Status: Date Acquired: 10/22/2016 Comorbid Cataracts, Arrhythmia, History of Weeks Of Treatment: 2 History: pressure wounds, Gout, Seizure Clustered Wound: No Disorder Photos Photo Uploaded By: Montey Hora on 02/13/2017 16:10:48 Wound Measurements Length: (cm) 3.5 Width: (cm) 5.2 Depth: (  cm) 0.2 Area: (cm) 14.294 Volume: (cm) 2.859 % Reduction in Area: -7.1% % Reduction in Volume: -7.1% Epithelialization: None Tunneling: No Undermining: No Wound Description Classification: Unstageable/Unclassified Foul Odor Afte Wound Margin: Flat and Intact Slough/Fibrino Exudate Amount: Large Exudate Type: Serous Exudate Color: amber r Cleansing: No No Wound Bed Granulation Amount: None Present (0%) Exposed Structure Necrotic Amount: Large (67-100%) Fascia Exposed: No Henningsen, Kyisha B. ([id number]) Necrotic Quality: Eschar Fat Layer (Subcutaneous Tissue)  Exposed: No Tendon Exposed: No Muscle Exposed: No Joint Exposed: No Bone Exposed: No Limited to Skin Breakdown Periwound Skin Texture Texture Color No Abnormalities Noted: No No Abnormalities Noted: No Callus: No Atrophie Blanche: No Crepitus: No Cyanosis: No Excoriation: No Ecchymosis: No Induration: No Erythema: No Rash: No Hemosiderin Staining: No Scarring: No Mottled: No Pallor: No Moisture Rubor: No No Abnormalities Noted: No Dry / Scaly: No Temperature / Pain Maceration: No Tenderness on Palpation: Yes Wound Preparation Ulcer Cleansing: Rinsed/Irrigated with Saline Topical Anesthetic Applied: Other: lidocaine 4%, Treatment Notes Wound #6 (Left Calcaneus) 1. Cleansed with: Clean wound with Normal Saline 2. Anesthetic Topical Lidocaine 4% cream to wound bed prior to debridement 4. Dressing Applied: Santyl Ointment 5. Secondary Dressing Applied Guaze, ABD and kerlix/Conform 7. Secured with Tape Other (specify in notes) Notes lightly with coban Electronic Signature(s) Signed: 02/13/2017 5:35:11 PM By: Montey Hora Entered By: Montey Hora on 02/13/2017 13:51:29 Vold, [name] ([id number]) -------------------------------------------------------------------------------- Wound Assessment Details Patient Name: Thon, Yuritzi B. Date of Service: 02/13/2017 1:30 PM Medical Record Patient Account Number: [id number] Number: Treating RN: Montey Hora 1941/03/29 (75 y.o. Other Clinician: Date of Birth/Sex: Female) Treating ROBSON, MICHAEL Primary Care Cayetano Mikita: Frazier Richards Rajveer Handler/Extender: G Referring Essence Merle: Jenelle Mages in Treatment: 2 Wound Status Wound Number: 7 Primary Pressure Ulcer Etiology: Wound Location: Right Lower Leg - Lateral, Proximal Wound Open Status: Wounding Event: Pressure Injury Comorbid Cataracts, Arrhythmia, History of Date Acquired: 10/22/2016 History: pressure wounds, Gout, Seizure Weeks Of  Treatment: 2 Disorder Clustered Wound: No Photos Photo Uploaded By: Montey Hora on 02/13/2017 16:11:19 Wound Measurements Length: (cm) 0.3 % Reduction in A Width: (cm) 0.3 % Reduction in V Depth: (cm) 0.1 Epithelializatio Area: (cm) 0.071 Tunneling: Volume: (cm) 0.007 Undermining: rea: 96.8% olume: 96.8% n: None No No Wound Description Classification: Category/Stage II Foul Odor After Wound Margin: Flat and Intact Slough/Fibrino Exudate Amount: Large Exudate Type: Serous Exudate Color: amber Cleansing: No Yes Wound Bed Granulation Amount: Large (67-100%) Exposed Structure Granulation Quality: Pink Fascia Exposed: No Serano, Daveigh B. ([id number]) Necrotic Amount: Small (1-33%) Fat Layer (Subcutaneous Tissue) Exposed: No Necrotic Quality: Adherent Slough Tendon Exposed: No Muscle Exposed: No Joint Exposed: No Bone Exposed: No Limited to Skin Breakdown Periwound Skin Texture Texture Color No Abnormalities Noted: No No Abnormalities Noted: No Callus: No Atrophie Blanche: No Crepitus: No Cyanosis: No Excoriation: No Ecchymosis: No Induration: No Erythema: No Rash: No Hemosiderin Staining: No Scarring: No Mottled: No Pallor: No Moisture Rubor: No No Abnormalities Noted: No Dry / Scaly: No Temperature / Pain Maceration: No Temperature: No Abnormality Tenderness on Palpation: Yes Wound Preparation Ulcer Cleansing: Rinsed/Irrigated with Saline Topical Anesthetic Applied: Other: lidocaine 4%, Treatment Notes Wound #7 (Right, Proximal, Lateral Lower Leg) 1. Cleansed with: Clean wound with Normal Saline 2. Anesthetic Topical Lidocaine 4% cream to wound bed prior to debridement 4. Dressing Applied: Prisma Ag 5. Secondary Dressing Applied Bordered Foam Dressing Electronic Signature(s) Signed: 02/13/2017 5:35:11 PM By: Montey Hora Entered By: Montey Hora on 02/13/2017 13:51:46 Cullipher, Simaya B.  ([id number]) --------------------------------------------------------------------------------  Wound Assessment Details Patient Name: Uptain, Mikalah B. Date of Service: 02/13/2017 1:30 PM Medical Record Patient Account Number: [id number] Number: Treating RN: Montey Hora July 15, 1941 (75 y.o. Other Clinician: Date of Birth/Sex: Female) Treating ROBSON, MICHAEL Primary Care Amour Trigg: Frazier Richards Gila Lauf/Extender: G Referring Karlos Scadden: Jenelle Mages in Treatment: 2 Wound Status Wound Number: 8 Primary Pressure Ulcer Etiology: Wound Location: Right Lower Leg - Lateral, Distal Wound Open Status: Wounding Event: Pressure Injury Comorbid Cataracts, Arrhythmia, History of Date Acquired: 10/22/2016 History: pressure wounds, Gout, Seizure Weeks Of Treatment: 2 Disorder Clustered Wound: No Photos Photo Uploaded By: Montey Hora on 02/13/2017 16:11:20 Wound Measurements Length: (cm) 12.3 Width: (cm) 2.4 Depth: (cm) 0.4 Area: (cm) 23.185 Volume: (cm) 9.274 % Reduction in Area: 28.4% % Reduction in Volume: 28.4% Epithelialization: None Wound Description Classification: Category/Stage IV Foul Odor After Wound Margin: Flat and Intact Slough/Fibrino Exudate Amount: Large Exudate Type: Serous Exudate Color: amber Cleansing: No Yes Wound Bed Granulation Amount: Small (1-33%) Exposed Structure Granulation Quality: Pink Fascia Exposed: No Hoos, Girl B. ([id number]) Necrotic Amount: Large (67-100%) Fat Layer (Subcutaneous Tissue) Exposed: No Necrotic Quality: Eschar Tendon Exposed: Yes Muscle Exposed: No Joint Exposed: No Bone Exposed: No Periwound Skin Texture Texture Color No Abnormalities Noted: No No Abnormalities Noted: No Callus: No Atrophie Blanche: No Crepitus: No Cyanosis: No Excoriation: No Ecchymosis: No Induration: No Erythema: No Rash: No Hemosiderin Staining: No Scarring: No Mottled: No Pallor: No Moisture Rubor:  No No Abnormalities Noted: No Dry / Scaly: No Temperature / Pain Maceration: No Temperature: No Abnormality Tenderness on Palpation: Yes Wound Preparation Ulcer Cleansing: Rinsed/Irrigated with Saline Topical Anesthetic Applied: Other: lidocaine 4%, Treatment Notes Wound #8 (Right, Distal, Lateral Lower Leg) 1. Cleansed with: Clean wound with Normal Saline 2. Anesthetic Topical Lidocaine 4% cream to wound bed prior to debridement 4. Dressing Applied: Hydrogel Prisma Ag 5. Secondary Dressing Applied Guaze, ABD and kerlix/Conform 7. Secured with Tape Other (specify in notes) Notes kerlix and coban wrap from toes to 3cm below the knee Electronic Signature(s) Signed: 02/13/2017 5:35:11 PM By: Montey Hora Entered By: Montey Hora on 02/13/2017 13:52:00 Ovitt, [name] ([id number]) Rodden, [name] ([id number]) -------------------------------------------------------------------------------- Vitals Details Patient Name: Aikens, Viviane B. Date of Service: 02/13/2017 1:30 PM Medical Record Patient Account Number: [id number] Number: Treating RN: Montey Hora 04-20-1941 (75 y.o. Other Clinician: Date of Birth/Sex: Female) Treating ROBSON, MICHAEL Primary Care Jamyrah Saur: Frazier Richards Mychele Seyller/Extender: G Referring Jaquavian Firkus: Jenelle Mages in Treatment: 2 Vital Signs Time Taken: 13:19 Temperature (F): 98.4 Pulse (bpm): 85 Respiratory Rate (breaths/min): 16 Blood Pressure (mmHg): 106/66 Reference Range: 80 - 120 mg / dl Electronic Signature(s) Signed: 02/13/2017 5:35:11 PM By: Montey Hora Entered By: Montey Hora on 02/13/2017 13:20:04

## 2017-02-15 NOTE — Progress Notes (Addendum)
[name] ([id number]) Visit Report for 02/13/2017 Chief Complaint Document Details Patient Name: [name], [name] Date of Service: 02/13/2017 1:30 PM Medical Record Patient Account Number: [id number] Number: Treating RN: Montey Hora 03-20-1941 (76 y.o. Other Clinician: Date of Birth/Sex: Female) Treating Dickie Cloe Primary Care Provider: Frazier Richards Provider/Extender: G Referring Provider: Jenelle Mages in Treatment: 2 Information Obtained from: Patient Chief Complaint 01/05/17; this is a 76 year old disabled woman from [facility] who is here for review of multiple wounds. She is accompanied by her son Engineer, maintenance) Signed: 02/14/2017 5:50:35 AM By: Linton Ham MD Entered By: Linton Ham on 02/13/2017 14:16:47 [name] ([id number]) -------------------------------------------------------------------------------- Debridement Details Patient Name: [name], [name] Date of Service: 02/13/2017 1:30 PM Medical Record Patient Account Number: [id number] Number: Treating RN: Montey Hora [birth year] (76 y.o. Other Clinician: Date of Birth/Sex: Female) Treating Shakyia Bosso Primary Care Provider: Frazier Richards Provider/Extender: G Referring Provider: Jenelle Mages in Treatment: 2 Debridement Performed for Wound #6 Left Calcaneus Assessment: Performed By: Physician Ricard Dillon, MD Debridement: Debridement Pre-procedure Yes - 13:58 Verification/Time Out Taken: Start Time: 13:58 Pain Control: Lidocaine 4% Topical Solution Level: Skin/Subcutaneous Tissue/Muscle Total Area Debrided (L x 3.5 (cm) x 5.2 (cm) = 18.2 (cm) W): Tissue and other Viable, Non-Viable, Eschar, Fibrin/Slough, Muscle, Subcutaneous material debrided: Instrument: Blade, Curette, Forceps Bleeding: Moderate Hemostasis Achieved: Pressure End Time: 14:02 Procedural Pain: 0 Post Procedural Pain: 0 Response to  Treatment: Procedure was tolerated well Post Debridement Measurements of Total Wound Length: (cm) 3.5 Stage: Unstageable/Unclassified Width: (cm) 5.2 Depth: (cm) 0.4 Volume: (cm) 5.718 Character of Wound/Ulcer Post Improved Debridement: Severity of Tissue Post Necrosis of muscle Debridement: Post Procedure Diagnosis Same as Pre-procedure Electronic Signature(s) Signed: 02/13/2017 5:35:11 PM By: Sunday Corn ([id number]) Signed: 02/14/2017 5:50:35 AM By: Linton Ham MD Entered By: Linton Ham on 02/13/2017 14:16:21 [name] ([id number]) -------------------------------------------------------------------------------- Debridement Details Patient Name: [name], [name] Date of Service: 02/13/2017 1:30 PM Medical Record Patient Account Number: [id number] Number: Treating RN: Cornell Barman [birth year] (76 y.o. Other Clinician: Date of Birth/Sex: Female) Treating Elizabeth Paulsen Primary Care Provider: Frazier Richards Provider/Extender: G Referring Provider: Jenelle Mages in Treatment: 2 Debridement Performed for Wound #8 Right,Distal,Lateral Lower Leg Assessment: Performed By: Physician Ricard Dillon, MD Debridement: Debridement Pre-procedure Yes - 13:55 Verification/Time Out Taken: Start Time: 13:55 Pain Control: Lidocaine 4% Topical Solution Level: Skin/Subcutaneous Tissue Total Area Debrided (L x 12.3 (cm) x 2.4 (cm) = 29.52 (cm) W): Tissue and other Viable, Non-Viable, Eschar, Subcutaneous, Tendon material debrided: Instrument: Curette, Scissors Bleeding: Moderate Hemostasis Achieved: Pressure End Time: 13:58 Procedural Pain: 0 Post Procedural Pain: 0 Response to Treatment: Procedure was tolerated well Post Debridement Measurements of Total Wound Length: (cm) 12.3 Stage: Category/Stage IV Width: (cm) 2.4 Depth: (cm) 0.4 Volume: (cm) 9.274 Character of Wound/Ulcer Post Improved Debridement: Severity  of Tissue Post Fat layer exposed Debridement: Post Procedure Diagnosis Same as Pre-procedure Electronic Signature(s) Signed: 03/07/2017 12:00:22 PM By: Gretta Cool, RN, BSN, Kim RN, BSN [name] ([id number]) Signed: 03/13/2017 7:40:19 AM By: Linton Ham MD Previous Signature: 02/13/2017 5:35:11 PM Version By: Montey Hora Previous Signature: 02/14/2017 5:50:35 AM Version By: Linton Ham MD Entered By: Gretta Cool RN, BSN, Kim on 03/07/2017 12:00:22 [name] ([id number]) -------------------------------------------------------------------------------- HPI Details Patient Name: [name], [name] Date of Service: 02/13/2017 1:30 PM Medical Record Patient Account Number: [id number] Number: Treating RN: Montey Hora 12-Mar-1941 (76 y.o. Other Clinician: Date of Birth/Sex: Female) Treating  Linton Ham Primary Care Provider: Frazier Richards Provider/Extender: G Referring Provider: Jenelle Mages in Treatment: 2 History of Present Illness HPI Description: 12/26/16; this is a 76 year old woman who is from [facility] skilled facility. She is accompanied by her son who provides most of the history. He tells me he was called in early December to report a fall in the shower room at the facility. An x-ray showed a right supracondylar distal femur fracture. She was put in a brace and treated nonoperatively. She was also admitted to hospital in mid December and discharged on 12/18. During this admission she had sepsis secondary to an acute UTI. In reviewing [facility] she is also quoted during that admission as having a left ischial tuberosity wound coccyx wound and a deep tissue injury over the left heel. Her son states that sometime after this and when she returned to see her orthopedic surgeon she developed wound areas under the brace on the right lateral leg have both distally and a smaller wound superiorly on the lateral right leg. I am not completely  certain what the facility is placing on these wounds. The patient has a history of atrial fibrillation. Her son tells me that years ago she had a fairly significant stroke but maintained a good quality of life up until 2 years ago when she developed dementia then things have really been going downhill since. She is been at a nursing facility for the last 18 months [[facility]] barely her nutritional intake is marginal but improving. Her son states that he is there and a lot of meals and she eats 75%. She is receiving nutritional supplements as well as protein supplements she has a level II pressure-relief surface and bunny boots bilaterally on both feet 01/02/17; patient returns this week. Not much change. I crosshatched the left heel last week they have been applying Santyl. The extensive areas on the right lateral leg especially distally with a large area of exposed tendon we have been applying silver collagen. To her coccyx wound also silver collagen. According to her son who visits her daily and participates in her care her intake is fairly good she drinks well takes her nutritional supplements. She is at [facility] skilled facility. 01/30/17; this is a patient I had seen 2 times last month. She was admitted to hospital from 2/25 through 2/28 with urosepsis and a seizure. She is at [facility]. The son has hospice services there but he uses them more just to add care to his mother rather than for any end-of-life mentality. We therefore of maintained a fairly aggressive posture to order wounds. This is in contradistinction to the last part of her discharge summary in February in which the discharging doctor as a note in bold font expressing that this patient is under hospice care and should be treated as such. I've never found that to be what her son is actually saying in any case she has an unstageable eschar over the left heel, a large wound on the right calf with  extensive exposed tendon and surrounding granulation, a smaller wound on the upper right calf which appears improved and a stage III sacral pressure ulcer which is also improved since the last time I saw her Dellarocco, [name] ([id number]) 02/13/17; I spoke to hospice and palliative care of [facility] last week and talked about my perception wound care that'll be necessary for Mrs. this is based on relatively extensive discussions have had with her son who has a  strong desire to get these wounds the best chance to heal. We have been using silver collagen on the sacral wound, right lateral lateral leg in 2 spots and Santyl to the left heel. Her son says she is eating better and they are being religious about turning her at the facility with constant supervision of family Electronic Signature(s) Signed: 02/14/2017 5:50:35 AM By: Linton Ham MD Entered By: Linton Ham on 02/13/2017 14:19:25 Howze, Raul [name] ([id number]) -------------------------------------------------------------------------------- Physical Exam Details Patient Name: [name], [name] Date of Service: 02/13/2017 1:30 PM Medical Record Patient Account Number: [id number] Number: Treating RN: Montey Hora 12/09/1940 (76 y.o. Other Clinician: Date of Birth/Sex: Female) Treating Kanaan Kagawa Primary Care Provider: Frazier Richards Provider/Extender: G Referring Provider: Jenelle Mages in Treatment: 2 Constitutional Sitting or standing Blood Pressure is within target range for patient.. Pulse regular and within target range for patient.Marland Kitchen Respirations regular, non-labored and within target range.. Temperature is normal and within the target range for the patient.. Patient's appearance is neat and clean. Appears in no acute distress. Well nourished and well developed.. Cardiovascular Pedal pulses are palpable bilaterally but not robust. Psychiatric Advanced dementia. Notes Wound exam; the  patient has thick black eschar over her left heel which I crosshatched 2-3 weeks ago. Today using pickups and scalpel this was removed. The tissue underneath does not look terrible although it is precariously close to bone. No overt infection oStage III pressure ulcer over her sacrum is making excellent progress with good granulation no debridement oRight lateral leg wounds o2. Lower wound is now separated into 2 areas. Exposed tendon did not look good today dry and discolored. This wound was debrided of necrotic surface tissue including nonviable areas of tendon Electronic Signature(s) Signed: 02/14/2017 5:50:35 AM By: Linton Ham MD Entered By: Linton Ham on 02/13/2017 14:21:31 Nipp, Raul [name] ([id number]) -------------------------------------------------------------------------------- Physician Orders Details Patient Name: [name], [name] Date of Service: 02/13/2017 1:30 PM Medical Record Patient Account Number: [id number] Number: Treating RN: Montey Hora Sep 06, 1941 (76 y.o. Other Clinician: Date of Birth/Sex: Female) Treating Raine Elsass Primary Care Provider: Frazier Richards Provider/Extender: G Referring Provider: Jenelle Mages in Treatment: 2 Verbal / Phone Orders: No Diagnosis Coding Wound Cleansing Wound #5 Sacrum o May shower with protection. - may cleanse all wounds in the shower except wound with tendon showing. Please protect that wound in the shower Wound #6 Left Calcaneus o May shower with protection. - may cleanse all wounds in the shower except wound with tendon showing. Please protect that wound in the shower Wound #7 Right,Proximal,Lateral Lower Leg o May shower with protection. - may cleanse all wounds in the shower except wound with tendon showing. Please protect that wound in the shower Wound #8 Right,Distal,Lateral Lower Leg o May shower with protection. - may cleanse all wounds in the shower except wound with  tendon showing. Please protect that wound in the shower Anesthetic Wound #5 Sacrum o Topical Lidocaine 4% cream applied to wound bed prior to debridement Wound #6 Left Calcaneus o Topical Lidocaine 4% cream applied to wound bed prior to debridement Wound #7 Right,Proximal,Lateral Lower Leg o Topical Lidocaine 4% cream applied to wound bed prior to debridement Wound #8 Right,Distal,Lateral Lower Leg o Topical Lidocaine 4% cream applied to wound bed prior to debridement Primary Wound Dressing Wound #5 Sacrum o Prisma Ag Wound #6 Left Calcaneus [name], [name] ([id number]) o Santyl Ointment Wound #7 Right,Proximal,Lateral Lower Leg o Prisma Ag - hydrogel to protect tendon Wound #8 Right,Distal,Lateral Lower  Leg o Prisma Ag Secondary Dressing Wound #5 Sacrum o Dry Gauze o Boardered Foam Dressing Wound #6 Left Calcaneus o ABD and Kerlix/Conform Wound #7 Right,Proximal,Lateral Lower Leg o ABD and Kerlix/Conform Wound #8 Right,Distal,Lateral Lower Leg o ABD and Kerlix/Conform Dressing Change Frequency Wound #5 Sacrum o Change dressing every day. Wound #6 Left Calcaneus o Change dressing every other day. Wound #7 Right,Proximal,Lateral Lower Leg o Change dressing every day. Wound #8 Right,Distal,Lateral Lower Leg o Change dressing every day. Follow-up Appointments Wound #5 Sacrum o Return Appointment in 2 weeks. Wound #6 Left Calcaneus o Return Appointment in 2 weeks. Wound #7 Right,Proximal,Lateral Lower Leg o Return Appointment in 2 weeks. Wound #8 Right,Distal,Lateral Lower Leg o Return Appointment in 2 weeks. [name], [name] ([id number]) Edema Control Wound #7 Right,Proximal,Lateral Lower Leg o Kerlix and Coban - Right Lower Extremity - wrap from toes to 3cm below the knee Wound #8 Right,Distal,Lateral Lower Leg o Kerlix and Coban - Right Lower Extremity - wrap from toes to 3cm below the knee Additional Orders /  Instructions Wound #5 Sacrum o Increase protein intake. Wound #6 Left Calcaneus o Increase protein intake. Wound #7 Right,Proximal,Lateral Lower Leg o Increase protein intake. Wound #8 Right,Distal,Lateral Lower Leg o Increase protein intake. Electronic Signature(s) Signed: 02/13/2017 5:35:11 PM By: Montey Hora Signed: 02/14/2017 5:50:35 AM By: Linton Ham MD Entered By: Montey Hora on 02/13/2017 14:03:18 Mosco, Raul [name] ([id number]) -------------------------------------------------------------------------------- Problem List Details Patient Name: [name], [name] Date of Service: 02/13/2017 1:30 PM Medical Record Patient Account Number: [id number] Number: Treating RN: Montey Hora 1941-09-16 (76 y.o. 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 Date of Service: 02/13/2017 1:30 PM Medical Record Patient Account Number: [id number] Number: Treating RN: Montey Hora 1941-07-20 (76 y.o. Other Clinician: Date of Birth/Sex: Female) Treating Masaki Rothbauer Primary Care Provider: Frazier Richards Provider/Extender: G Referring Provider: Jenelle Mages in Treatment:  2 Subjective Chief Complaint Information obtained from Patient 01/05/17; this is a 76 year old disabled woman from [facility] who is here for review of multiple wounds. She is accompanied by her son History of Present Illness (HPI) 12/26/16; this is a 76 year old woman who is from [facility] skilled facility. She is accompanied by her son who provides most of the history. He tells me he was called in early December to report a fall in the shower room at the facility. An x-ray showed a right supracondylar distal femur fracture. She was put in a brace and treated nonoperatively. She was also admitted to hospital in mid December and discharged on 12/18. During this admission she had sepsis secondary to an acute UTI. In reviewing [facility] she is also quoted during that admission as having a left ischial tuberosity wound coccyx wound and a deep tissue injury over the left heel. Her son states that sometime after this and when she returned to see her orthopedic surgeon she developed wound areas under the brace on the right lateral leg have both distally  and a smaller wound superiorly on the lateral right leg. I am not completely certain what the facility is placing on these wounds. The patient has a history of atrial fibrillation. Her son tells me that years ago she had a fairly significant stroke but maintained a good quality of life up until 2 years ago when she developed dementia then things have really been going downhill since. She is been at a nursing facility for the last 18 months [[facility]] barely her nutritional intake is marginal but improving. Her son states that he is there and a lot of meals and she eats 75%. She is receiving nutritional supplements as well as protein supplements she has a level II pressure-relief surface and bunny boots bilaterally on both feet 01/02/17; patient returns this week. Not much change. I crosshatched the left heel last week they have  been applying Santyl. The extensive areas on the right lateral leg especially distally with a large area of exposed tendon we have been applying silver collagen. To her coccyx wound also silver collagen. According to her son who visits her daily and participates in her care her intake is fairly good she drinks well takes her nutritional supplements. She is at [facility] skilled facility. 01/30/17; this is a patient I had seen 2 times last month. She was admitted to hospital from 2/25 through 2/28 with urosepsis and a seizure. She is at [facility]. The son has hospice services there but he uses them more just to add care to his mother rather than for any end-of-life mentality. We therefore of maintained a fairly aggressive posture to order wounds. This is in contradistinction to the last part of her discharge summary in February in which the discharging doctor as a note in bold font expressing that this [name], Qiara B. ([id number]) patient is under hospice care and should be treated as such. I've never found that to be what her son is actually saying in any case she has an unstageable eschar over the left heel, a large wound on the right calf with extensive exposed tendon and surrounding granulation, a smaller wound on the upper right calf which appears improved and a stage III sacral pressure ulcer which is also improved since the last time I saw her 02/13/17; I spoke to hospice and palliative care of [facility] last week and talked about my perception wound care that'll be necessary for Mrs. this is based on relatively extensive discussions have had with her son who has a strong desire to get these wounds the best chance to heal. We have been using silver collagen on the sacral wound, right lateral lateral leg in 2 spots and Santyl to the left heel. Her son says she is eating better and they are being religious about turning her at the facility with constant supervision of  family Objective Constitutional Sitting or standing Blood Pressure is within target range for patient.. Pulse regular and within target range for patient.Marland Kitchen Respirations regular, non-labored and within target range.. Temperature is normal and within the target range for the patient.. Patient's appearance is neat and clean. Appears in no acute distress. Well nourished and well developed.. Vitals Time Taken: 1:19 PM, Temperature: 98.4 F, Pulse: 85 bpm, Respiratory Rate: 16 breaths/min, Blood Pressure: 106/66 mmHg. Cardiovascular Pedal pulses are palpable bilaterally but not robust. Psychiatric Advanced dementia. General Notes: Wound exam; the patient has thick black eschar over her left heel which I crosshatched 2-3 weeks ago. Today using pickups and scalpel this was  removed. The tissue underneath does not look terrible although it is precariously close to bone. No overt infection Stage III pressure ulcer over her sacrum is making excellent progress with good granulation no debridement Right lateral leg wounds o2. Lower wound is now separated into 2 areas. Exposed tendon did not look good today dry and discolored. This wound was debrided of necrotic surface tissue including nonviable areas of tendon Integumentary (Hair, Skin) Wound #5 status is Open. Original cause of wound was Pressure Injury. The wound is located on the Sacrum. The wound measures 1.7cm length x 2.3cm width x 0.1cm depth; 3.071cm^2 area and 0.307cm^3 volume. The wound is limited to skin breakdown. There is no tunneling or undermining noted. There is a large amount of serous drainage noted. The wound margin is flat and intact. There is large (67-100%) pink Toor, Lashay B. ([id number]) granulation within the wound bed. There is no necrotic tissue within the wound bed. The periwound skin appearance did not exhibit: Callus, Crepitus, Excoriation, Induration, Rash, Scarring, Dry/Scaly, Maceration, Atrophie Blanche, Cyanosis,  Ecchymosis, Hemosiderin Staining, Mottled, Pallor, Rubor, Erythema. The periwound has tenderness on palpation. Wound #6 status is Open. Original cause of wound was Pressure Injury. The wound is located on the Left Calcaneus. The wound measures 3.5cm length x 5.2cm width x 0.2cm depth; 14.294cm^2 area and 2.859cm^3 volume. The wound is limited to skin breakdown. There is no tunneling or undermining noted. There is a large amount of serous drainage noted. The wound margin is flat and intact. There is no granulation within the wound bed. There is a large (67-100%) amount of necrotic tissue within the wound bed including Eschar. The periwound skin appearance did not exhibit: Callus, Crepitus, Excoriation, Induration, Rash, Scarring, Dry/Scaly, Maceration, Atrophie Blanche, Cyanosis, Ecchymosis, Hemosiderin Staining, Mottled, Pallor, Rubor, Erythema. The periwound has tenderness on palpation. Wound #7 status is Open. Original cause of wound was Pressure Injury. The wound is located on the Right,Proximal,Lateral Lower Leg. The wound measures 0.3cm length x 0.3cm width x 0.1cm depth; 0.071cm^2 area and 0.007cm^3 volume. The wound is limited to skin breakdown. There is no tunneling or undermining noted. There is a large amount of serous drainage noted. The wound margin is flat and intact. There is large (67-100%) pink granulation within the wound bed. There is a small (1-33%) amount of necrotic tissue within the wound bed including Adherent Slough. The periwound skin appearance did not exhibit: Callus, Crepitus, Excoriation, Induration, Rash, Scarring, Dry/Scaly, Maceration, Atrophie Blanche, Cyanosis, Ecchymosis, Hemosiderin Staining, Mottled, Pallor, Rubor, Erythema. Periwound temperature was noted as No Abnormality. The periwound has tenderness on palpation. Wound #8 status is Open. Original cause of wound was Pressure Injury. The wound is located on the Right,Distal,Lateral Lower Leg. The wound  measures 12.3cm length x 2.4cm width x 0.4cm depth; 23.185cm^2 area and 9.274cm^3 volume. There is tendon exposed. There is a large amount of serous drainage noted. The wound margin is flat and intact. There is small (1-33%) pink granulation within the wound bed. There is a large (67-100%) amount of necrotic tissue within the wound bed including Eschar. The periwound skin appearance did not exhibit: Callus, Crepitus, Excoriation, Induration, Rash, Scarring, Dry/Scaly, Maceration, Atrophie Blanche, Cyanosis, Ecchymosis, Hemosiderin Staining, Mottled, Pallor, Rubor, Erythema. Periwound temperature was noted as No Abnormality. The periwound has tenderness on palpation. 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 ([id number]) Procedures Wound #6 Wound #6 is a Pressure Ulcer located on the Left Calcaneus . There was a Skin/Subcutaneous Tissue/Muscle Debridement ([id number]) debridement with total area of 18.2 sq cm performed by Ricard Dillon, MD. with the following instrument(s): Blade, Curette, and Forceps to remove Viable and Non-Viable tissue/material including Fibrin/Slough, Muscle, Eschar, and Subcutaneous after achieving pain control using Lidocaine 4% Topical Solution. A time out was conducted at 13:58, prior to the start of the procedure. A Moderate amount of bleeding was controlled with Pressure. The procedure was tolerated well with a pain level of 0 throughout and a pain level of 0 following the procedure. Post Debridement Measurements: 3.5cm length x 5.2cm width x 0.4cm depth; 5.718cm^3 volume. Post debridement Stage noted as Unstageable/Unclassified. Character of Wound/Ulcer Post Debridement is improved. Severity of Tissue Post Debridement is: Necrosis of muscle. Post procedure Diagnosis Wound #6:  Same as Pre-Procedure Wound #8 Wound #8 is a Pressure Ulcer located on the Right,Distal,Lateral Lower Leg . There was a Skin/Subcutaneous Tissue Debridement ([id number]) debridement with total area of 29.52 sq cm performed by Ricard Dillon, MD. with the following instrument(s): Curette and Scissors to remove Viable and Non-Viable tissue/material including Eschar, Tendon, and Subcutaneous after achieving pain control using Lidocaine 4% Topical Solution. A time out was conducted at 13:55, prior to the start of the procedure. A Moderate amount of bleeding was controlled with Pressure. The procedure was tolerated well with a pain level of 0 throughout and a pain level of 0 following the procedure. Post Debridement Measurements: 12.3cm length x 2.4cm width x 0.4cm depth; 9.274cm^3 volume. Post debridement Stage noted as Category/Stage IV. Character of Wound/Ulcer Post Debridement is improved. Severity of Tissue Post Debridement is: Fat layer exposed. Post procedure Diagnosis Wound #8: Same as Pre-Procedure #1 using pickups and a scalpel I remove necrotic eschar from the surface left heel and then necrotic tissue including muscle from the base of the wound. There is no exposed bone hemostasis with direct pressure she tolerated this well #2 the area on her right calf I remove necrotic tissue from the surface of the wound nonviable tendon. I used a #3 curet for this hemostasis with direct pressure. She tolerated this well Plan Wound Cleansing: Wound #5 Sacrum: May shower with protection. - may cleanse all wounds in the shower except wound with tendon showing. [name], [name] ([id number]) Please protect that wound in the shower Wound #6 Left Calcaneus: May shower with protection. - may cleanse all wounds in the shower except wound with tendon showing. Please protect that wound in the shower Wound #7 Right,Proximal,Lateral Lower Leg: May shower with protection. - may cleanse all wounds in the  shower except wound with tendon showing. Please protect that wound in the shower Wound #8 Right,Distal,Lateral Lower Leg: May shower with protection. - may cleanse all wounds in the shower except wound with tendon showing. Please protect that wound in the shower Anesthetic: Wound #5 Sacrum: Topical Lidocaine 4% cream applied to wound bed prior to debridement Wound #6 Left Calcaneus: Topical Lidocaine 4% cream applied to wound bed prior to debridement Wound #7 Right,Proximal,Lateral Lower Leg: Topical Lidocaine 4% cream applied to wound bed prior to debridement Wound #8 Right,Distal,Lateral Lower Leg: Topical Lidocaine 4% cream applied to wound bed prior to debridement Primary Wound Dressing: Wound #5 Sacrum: Prisma Ag Wound #6 Left Calcaneus: Santyl Ointment Wound #7 Right,Proximal,Lateral Lower Leg: Prisma Ag - hydrogel to protect tendon Wound #8 Right,Distal,Lateral Lower Leg: Prisma Ag Secondary Dressing: Wound #5  Sacrum: Dry Gauze Boardered Foam Dressing Wound #6 Left Calcaneus: ABD and Kerlix/Conform Wound #7 Right,Proximal,Lateral Lower Leg: ABD and Kerlix/Conform Wound #8 Right,Distal,Lateral Lower Leg: ABD and Kerlix/Conform Dressing Change Frequency: Wound #5 Sacrum: Change dressing every day. Wound #6 Left Calcaneus: Change dressing every other day. Wound #7 Right,Proximal,Lateral Lower Leg: Change dressing every day. Wound #8 Right,Distal,Lateral Lower Leg: Change dressing every day. Follow-up Appointments: Wound #5 Sacrum: Return Appointment in 2 weeks. Wound #6 Left Calcaneus: Christian, [name] ([id number]) Return Appointment in 2 weeks. Wound #7 Right,Proximal,Lateral Lower Leg: Return Appointment in 2 weeks. Wound #8 Right,Distal,Lateral Lower Leg: Return Appointment in 2 weeks. Edema Control: Wound #7 Right,Proximal,Lateral Lower Leg: Kerlix and Coban - Right Lower Extremity - wrap from toes to 3cm below the knee Wound #8 Right,Distal,Lateral Lower  Leg: Kerlix and Coban - Right Lower Extremity - wrap from toes to 3cm below the knee Additional Orders / Instructions: Wound #5 Sacrum: Increase protein intake. Wound #6 Left Calcaneus: Increase protein intake. Wound #7 Right,Proximal,Lateral Lower Leg: Increase protein intake. Wound #8 Right,Distal,Lateral Lower Leg: Increase protein intake. #1 extensive debridement today done on the left heel and right lateral leg wound #2 I'm going to put Apligraf through her insurance but before we can do that he will have to move from hospice to palliative care. I have talked to patient's son and POA about this extensively and I spoke to the hospice coordinator last week. This is not something I am pushing or necessarily agree with however it is what the patient's representative wants i.e. essentially a chance to heal these wounds #3 for now we are going to continue with Santyl to the left heel, [employer] and to the other wounds #4 follow-up in 2 weeks Electronic Signature(s) Signed: 03/07/2017 12:00:36 PM By: Gretta Cool RN, BSN, Kim RN, BSN Signed: 03/13/2017 7:40:19 AM By: Linton Ham MD Previous Signature: 02/14/2017 5:50:35 AM Version By: Linton Ham MD Entered By: Gretta Cool, RN, BSN, Kim on 03/07/2017 12:00:36 Keesey, Raul [name] ([id number]) -------------------------------------------------------------------------------- [facility] Patient Name: Maciver, Kalima B. Date of Service: 02/13/2017 Medical Record Patient Account Number: [id number] Number: Treating RN: Montey Hora August 29, 1941 (76 y.o. 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 Facility Procedures CPT4 Code: [id number] Description: 18563 - DEB MUSC/FASCIA 20 SQ CM/< ICD-10 Description Diagnosis L89.620 Pressure ulcer of left heel, unstageable Modifier: Quantity: 1 CPT4 Code: [id number] Description: 11046 - DEB MUSC/FASCIA EA ADDL 20 CM ICD-10 Description Diagnosis L89.620 Pressure ulcer of left heel, unstageable Modifier: Quantity: 2 Physician Procedures CPT4 Code: [id number] Description: 11043 - WC PHYS DEBR MUSCLE/FASCIA 20 SQ CM ICD-10 Description Diagnosis L89.620 Pressure ulcer of left heel, unstageable Modifier: Quantity: 1 CPT4 Code: [id number] Description: 11046 - WC PHYS DEB MUSC/FASC EA ADDL 20 CM ICD-10 Description Diagnosis L89.620 Pressure ulcer of left heel, unstageable Modifier: Quantity: 2 Electronic Signature(s) Signed: 02/28/2017 10:06:09 AM By: Gretta Cool, RN, BSN, Kim RN, BSN Berzins, Raul [name] ([id number]) Signed: 03/06/2017 8:40:59 AM By: Linton Ham MD Previous Signature: 02/14/2017 5:50:35 AM Version By: Linton Ham MD Entered By: Gretta Cool, RN, BSN, Kim on 02/28/2017 10:06:09

## 2017-02-27 ENCOUNTER — Encounter: Payer: Medicare Other | Attending: Internal Medicine | Admitting: Internal Medicine

## 2017-02-27 DIAGNOSIS — L89153 Pressure ulcer of sacral region, stage 3: Secondary | ICD-10-CM | POA: Insufficient documentation

## 2017-02-27 DIAGNOSIS — L89620 Pressure ulcer of left heel, unstageable: Secondary | ICD-10-CM | POA: Diagnosis not present

## 2017-02-27 DIAGNOSIS — I4891 Unspecified atrial fibrillation: Secondary | ICD-10-CM | POA: Insufficient documentation

## 2017-02-27 DIAGNOSIS — M109 Gout, unspecified: Secondary | ICD-10-CM | POA: Diagnosis not present

## 2017-02-27 DIAGNOSIS — F0390 Unspecified dementia without behavioral disturbance: Secondary | ICD-10-CM | POA: Insufficient documentation

## 2017-02-27 DIAGNOSIS — G40909 Epilepsy, unspecified, not intractable, without status epilepticus: Secondary | ICD-10-CM | POA: Insufficient documentation

## 2017-02-27 DIAGNOSIS — K219 Gastro-esophageal reflux disease without esophagitis: Secondary | ICD-10-CM | POA: Diagnosis not present

## 2017-02-27 DIAGNOSIS — Z8673 Personal history of transient ischemic attack (TIA), and cerebral infarction without residual deficits: Secondary | ICD-10-CM | POA: Insufficient documentation

## 2017-02-27 DIAGNOSIS — L97213 Non-pressure chronic ulcer of right calf with necrosis of muscle: Secondary | ICD-10-CM | POA: Insufficient documentation

## 2017-03-01 NOTE — Progress Notes (Addendum)
[name] ([id number]) Visit Report for 02/27/2017 Chief Complaint Document Details Patient Name: [name], [name] Date of Service: 02/27/2017 1:30 PM Medical Record Patient Account Number: [id number] Number: Treating RN: [name] Aug 11, 1941 (76 y.o. Other Clinician: Date of Birth/Sex: Female) Treating [name] Primary Care Provider: Frazier Richards Provider/Extender: G Referring Provider: Jenelle Mages in Treatment: 4 Information Obtained from: Patient Chief Complaint 01/05/17; this is a 76 year old disabled woman from [facility] who is here for review of multiple wounds. She is accompanied by her son Electronic Signature(s) Signed: 02/27/2017 5:13:56 PM By: Michelle Ham [name] Entered By: [name] on 02/27/2017 15:11:22 Older, [name] ([id number]) -------------------------------------------------------------------------------- Debridement Details Patient Name: [name], [name] Date of Service: 02/27/2017 1:30 PM Medical Record Patient Account Number: [id number] Number: Treating RN: [name] [birth year] (76 y.o. Other Clinician: Date of Birth/Sex: Female) Treating [name] Primary Care Provider: Frazier Richards Provider/Extender: G Referring Provider: Jenelle Mages in Treatment: 4 Debridement Performed for Wound #6 Left Calcaneus Assessment: Performed By: Physician [name] Debridement: Debridement Pre-procedure Yes - 14:21 Verification/Time Out Taken: Start Time: 14:21 Pain Control: Lidocaine 4% Topical Solution Level: Skin/Subcutaneous Tissue Total Area Debrided (L x 3.2 (cm) x 5 (cm) = 16 (cm) W): Tissue and other Viable, Non-Viable, Eschar, Fibrin/Slough, Subcutaneous material debrided: Instrument: Blade, Forceps Bleeding: Minimum Hemostasis Achieved: Pressure End Time: 14:24 Procedural Pain: 0 Post Procedural Pain: 0 Response to Treatment: Procedure was  tolerated well Post Debridement Measurements of Total Wound Length: (cm) 3.2 Stage: Unstageable/Unclassified Width: (cm) 5 Depth: (cm) 0.6 Volume: (cm) 7.54 Character of Wound/Ulcer Post Improved Debridement: Severity of Tissue Post Muscle involvement without Debridement: necrosis Post Procedure Diagnosis Same as Pre-procedure Electronic Signature(s) Signed: 02/27/2017 5:10:19 PM By: [name] ([id number]) Signed: 02/27/2017 5:13:56 PM By: Michelle Ham [name] Entered By: [name] on 02/27/2017 15:11:06 Rodd, [name] ([id number]) -------------------------------------------------------------------------------- Debridement Details Patient Name: [name], [name] Date of Service: 02/27/2017 1:30 PM Medical Record Patient Account Number: [id number] Number: Treating RN: [name] 04/18/1941 (76 y.o. Other Clinician: Date of Birth/Sex: Female) Treating [name] Primary Care Provider: Frazier Richards Provider/Extender: G Referring Provider: Jenelle Mages in Treatment: 4 Debridement Performed for Wound #8 Right,Distal,Lateral Lower Leg Assessment: Performed By: Physician [name] Debridement: Debridement Pre-procedure Yes - 14:18 Verification/Time Out Taken: Start Time: 14:18 Pain Control: Lidocaine 4% Topical Solution Level: Skin/Subcutaneous Tissue/Muscle Total Area Debrided (L x 12.1 (cm) x 2.6 (cm) = 31.46 (cm) W): Tissue and other Viable, Non-Viable, Eschar, Fibrin/Slough, Subcutaneous, Tendon material debrided: Instrument: Blade, Forceps Bleeding: Minimum Hemostasis Achieved: Pressure End Time: 14:21 Procedural Pain: 0 Post Procedural Pain: 0 Response to Treatment: Procedure was tolerated well Post Debridement Measurements of Total Wound Length: (cm) 12.1 Stage: Category/Stage IV Width: (cm) 2.6 Depth: (cm) 0.6 Volume: (cm) 14.825 Character of Wound/Ulcer  Post Improved Debridement: Severity of Tissue Post Debridement: Other severity specified Post Procedure Diagnosis Same as Pre-procedure Electronic Signature(s) Signed: 03/07/2017 12:38:50 PM By: Michelle Cool, RN, BSN, Kim RN, BSN Signed: 03/13/2017 7:39:42 AM By: Michelle Ham [name] [name] ([id number]) Previous Signature: 02/27/2017 5:10:19 PM Version By: [name] Previous Signature: 02/27/2017 5:13:56 PM Version By: Michelle Ham [name] Entered By: Michelle Cool RN, BSN, [name] on 03/07/2017 12:38:50 [name] ([id number]) -------------------------------------------------------------------------------- HPI Details Patient Name: [name], [name] Date of Service: 02/27/2017 1:30 PM Medical Record Patient Account Number: [id number] Number: Treating RN: [name] [birth year] (76 y.o. Other Clinician: Date of Birth/Sex: Female) Treating  [name] Primary Care Provider: Frazier Richards Provider/Extender: G Referring Provider: Jenelle Mages in Treatment: 4 History of Present Illness HPI Description: 12/26/16; this is a 76 year old woman who is from [facility] skilled facility. She is accompanied by her son who provides most of the history. He tells me he was called in early December to report a fall in the shower room at the facility. An x-ray showed a right supracondylar distal femur fracture. She was put in a brace and treated nonoperatively. She was also admitted to hospital in mid December and discharged on 12/18. During this admission she had sepsis secondary to an acute UTI. In reviewing [facility] she is also quoted during that admission as having a left ischial tuberosity wound coccyx wound and a deep tissue injury over the left heel. Her son states that sometime after this and when she returned to see her orthopedic surgeon she developed wound areas under the brace on the right lateral leg have both distally and a smaller wound superiorly on  the lateral right leg. I am not completely certain what the facility is placing on these wounds. The patient has a history of atrial fibrillation. Her son tells me that years ago she had a fairly significant stroke but maintained a good quality of life up until 2 years ago when she developed dementia then things have really been going downhill since. She is been at a nursing facility for the last 18 months [[facility]] barely her nutritional intake is marginal but improving. Her son states that he is there and a lot of meals and she eats 75%. She is receiving nutritional supplements as well as protein supplements she has a level II pressure-relief surface and bunny boots bilaterally on both feet 01/02/17; patient returns this week. Not much change. I crosshatched the left heel last week they have been applying Santyl. The extensive areas on the right lateral leg especially distally with a large area of exposed tendon we have been applying silver collagen. To her coccyx wound also silver collagen. According to her son who visits her daily and participates in her care her intake is fairly good she drinks well takes her nutritional supplements. She is at [facility] skilled facility. 01/30/17; this is a patient I had seen 2 times last month. She was admitted to hospital from 2/25 through 2/28 with urosepsis and a seizure. She is at [facility]. The son has hospice services there but he uses them more just to add care to his mother rather than for any end-of-life mentality. We therefore of maintained a fairly aggressive posture to order wounds. This is in contradistinction to the last part of her discharge summary in February in which the discharging doctor as a note in bold font expressing that this patient is under hospice care and should be treated as such. I've never found that to be what her son is actually saying in any case she has an unstageable eschar over the left heel, a large  wound on the right calf with extensive exposed tendon and surrounding granulation, a smaller wound on the upper right calf which appears improved and a stage III sacral pressure ulcer which is also improved since the last time I saw her Negron, Janee B. ([id number]) 02/13/17; I spoke to hospice and palliative care of [facility] last week and talked about my perception wound care that'll be necessary for Mrs. this is based on relatively extensive discussions have had with her son who has a  strong desire to get these wounds the best chance to heal. We have been using silver collagen on the sacral wound, right lateral lateral leg in 2 spots and Santyl to the left heel. Her son says she is eating better and they are being religious about turning her at the facility with constant supervision of family 02/27/17; since our last visit the patient's son has disenrolled her from hospice. This increases our flexibility about what we can do to attempt to heal her pressure areas. Currently we are treating a left heel wound, right lateral leg wound and a lower sacral wound. The area on her sacrum and heel are pressure ulcers while the area on the right lateral leg was a pressure injury from an orthopedic brace. Electronic Signature(s) Signed: 02/27/2017 5:13:56 PM By: Michelle Ham [name] Entered By: [name] on 02/27/2017 15:12:49 Ivanov, [name] ([id number]) -------------------------------------------------------------------------------- Physical Exam Details Patient Name: [name], [name] Date of Service: 02/27/2017 1:30 PM Medical Record Patient Account Number: [id number] Number: Treating RN: [name] 1941-04-08 (75 y.o. Other Clinician: Date of Birth/Sex: Female) Treating [name] Primary Care Provider: Frazier Richards Provider/Extender: G Referring Provider: Jenelle Mages in Treatment: 4 Constitutional Patient is hypotensive. She appears stable however.  Pulse regular and within target range for patient.Marland Kitchen Respirations regular, non-labored and within target range.. Temperature is normal and within the target range for the patient.. Cardiovascular Pedal pulses palpable and strong bilaterally.. Notes Wound exam; the patient has necrotic material over the left heel. Using pickups and a scalpel I remove necrotic surface of this. This is a deep wound which approaches bone although I think there may be a viable surface that we can work with post debridement. oThe area on the right leg laterally looks improved although there is still some visible tendon the area again was debrided with pickups and a scalpel oThe area over the lower sacral looks better Electronic Signature(s) Signed: 02/27/2017 5:13:56 PM By: Michelle Ham [name] Entered By: [name] on 02/27/2017 15:16:00 Werk, [name] ([id number]) -------------------------------------------------------------------------------- Physician Orders Details Patient Name: [name], [name] Date of Service: 02/27/2017 1:30 PM Medical Record Patient Account Number: [id number] Number: Treating RN: [name] [birth year] (75 y.o. Other Clinician: Date of Birth/Sex: Female) Treating [name] Primary Care Provider: Frazier Richards Provider/Extender: G Referring Provider: Jenelle Mages in Treatment: 4 Verbal / Phone Orders: No Diagnosis Coding Wound Cleansing Wound #5 Sacrum o May shower with protection. - may cleanse all wounds in the shower except wound with tendon showing. Please protect that wound in the shower Wound #6 Left Calcaneus o May shower with protection. - may cleanse all wounds in the shower except wound with tendon showing. Please protect that wound in the shower Wound #7 Right,Proximal,Lateral Lower Leg o May shower with protection. - may cleanse all wounds in the shower except wound with tendon showing. Please protect that wound in the  shower Wound #8 Right,Distal,Lateral Lower Leg o May shower with protection. - may cleanse all wounds in the shower except wound with tendon showing. Please protect that wound in the shower Anesthetic Wound #5 Sacrum o Topical Lidocaine 4% cream applied to wound bed prior to debridement Wound #6 Left Calcaneus o Topical Lidocaine 4% cream applied to wound bed prior to debridement Wound #7 Right,Proximal,Lateral Lower Leg o Topical Lidocaine 4% cream applied to wound bed prior to debridement Wound #8 Right,Distal,Lateral Lower Leg o Topical Lidocaine 4% cream applied to wound bed prior to debridement Primary Wound Dressing Wound #5  Sacrum o Prisma Ag Wound #6 Left Calcaneus [name], [name] ([id number]) o Santyl Ointment Wound #7 Right,Proximal,Lateral Lower Leg o Prisma Ag - hydrogel to protect tendon Wound #8 Right,Distal,Lateral Lower Leg o Prisma Ag Secondary Dressing Wound #5 Sacrum o Dry Gauze o Boardered Foam Dressing Wound #6 Left Calcaneus o ABD and Kerlix/Conform Wound #7 Right,Proximal,Lateral Lower Leg o ABD and Kerlix/Conform Wound #8 Right,Distal,Lateral Lower Leg o ABD and Kerlix/Conform Dressing Change Frequency Wound #5 Sacrum o Change dressing every day. Wound #6 Left Calcaneus o Change dressing every other day. Wound #7 Right,Proximal,Lateral Lower Leg o Change dressing every day. Wound #8 Right,Distal,Lateral Lower Leg o Change dressing every day. Follow-up Appointments Wound #5 Sacrum o Return Appointment in 2 weeks. Wound #6 Left Calcaneus o Return Appointment in 2 weeks. Wound #7 Right,Proximal,Lateral Lower Leg o Return Appointment in 2 weeks. Wound #8 Right,Distal,Lateral Lower Leg o Return Appointment in 2 weeks. Moscoso, [name] ([id number]) Edema Control Wound #7 Right,Proximal,Lateral Lower Leg o Kerlix and Coban - Right Lower Extremity - wrap from toes to 3cm below the knee Wound #8  Right,Distal,Lateral Lower Leg o Kerlix and Coban - Right Lower Extremity - wrap from toes to 3cm below the knee Additional Orders / Instructions Wound #5 Sacrum o Increase protein intake. Wound #6 Left Calcaneus o Increase protein intake. Wound #7 Right,Proximal,Lateral Lower Leg o Increase protein intake. Wound #8 Right,Distal,Lateral Lower Leg o Increase protein intake. Electronic Signature(s) Signed: 02/27/2017 5:10:19 PM By: [name] Signed: 02/27/2017 5:13:56 PM By: Michelle Ham [name] Entered By: [name] on 02/27/2017 14:26:19 Moisan, [name] ([id number]) -------------------------------------------------------------------------------- Problem List Details Patient Name: [name], [name] Date of Service: 02/27/2017 1:30 PM Medical Record Patient Account Number: [id number] Number: Treating RN: [name] 11-14-1941 (75 y.o. 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 Date of Service: 02/27/2017 1:30 PM Medical Record Patient Account Number: [id number] Number: Treating RN: [name] [birth year] (75 y.o. Other Clinician: Date of Birth/Sex: Female) Treating ROBSON,  [name] Primary Care Provider: Frazier Richards Provider/Extender: G Referring Provider: Jenelle Mages in Treatment: 4 Subjective Chief Complaint Information obtained from Patient 01/05/17; this is a 76 year old disabled woman from [facility] who is here for review of multiple wounds. She is accompanied by her son History of Present Illness (HPI) 12/26/16; this is a 76 year old woman who is from [facility] skilled facility. She is accompanied by her son who provides most of the history. He tells me he was called in early December to report a fall in the shower room at the facility. An x-ray showed a right supracondylar distal femur fracture. She was put in a brace and treated nonoperatively. She was also admitted to hospital in mid December and discharged on 12/18. During this admission she had sepsis secondary to an acute UTI. In reviewing [facility] she is also quoted during that admission as having a left ischial tuberosity wound coccyx wound and a deep tissue injury over the left  heel. Her son states that sometime after this and when she returned to see her orthopedic surgeon she developed wound areas under the brace on the right lateral leg have both distally and a smaller wound superiorly on the lateral right leg. I am not completely certain what the facility is placing on these wounds. The patient has a history of atrial fibrillation. Her son tells me that years ago she had a fairly significant stroke but maintained a good quality of life up until 2 years ago when she developed dementia then things have really been going downhill since. She is been at a nursing facility for the last 18 months [[facility]] barely her nutritional intake is marginal but improving. Her son states that he is there and a lot of meals and she eats 75%. She is receiving nutritional supplements as well as protein supplements she has a level II pressure-relief surface and bunny  boots bilaterally on both feet 01/02/17; patient returns this week. Not much change. I crosshatched the left heel last week they have been applying Santyl. The extensive areas on the right lateral leg especially distally with a large area of exposed tendon we have been applying silver collagen. To her coccyx wound also silver collagen. According to her son who visits her daily and participates in her care her intake is fairly good she drinks well takes her nutritional supplements. She is at [facility] skilled facility. 01/30/17; this is a patient I had seen 2 times last month. She was admitted to hospital from 2/25 through 2/28 with urosepsis and a seizure. She is at [facility]. The son has hospice services there but he uses them more just to add care to his mother rather than for any end-of-life mentality. We therefore of maintained a fairly aggressive posture to order wounds. This is in contradistinction to the last part of her discharge summary in February in which the discharging doctor as a note in bold font expressing that this [place]arico, Makila B. ([id number]) patient is under hospice care and should be treated as such. I've never found that to be what her son is actually saying in any case she has an unstageable eschar over the left heel, a large wound on the right calf with extensive exposed tendon and surrounding granulation, a smaller wound on the upper right calf which appears improved and a stage III sacral pressure ulcer which is also improved since the last time I saw her 02/13/17; I spoke to hospice and palliative care of [facility] last week and talked about my perception wound care that'll be necessary for Mrs. this is based on relatively extensive discussions have had with her son who has a strong desire to get these wounds the best chance to heal. We have been using silver collagen on the sacral wound, right lateral lateral leg in 2 spots and Santyl to the left heel.  Her son says she is eating better and they are being religious about turning her at the facility with constant supervision of family 02/27/17; since our last visit the patient's son has disenrolled her from hospice. This increases our flexibility about what we can do to attempt to heal her pressure areas. Currently we are treating a left heel wound, right lateral leg wound and a lower sacral wound. The area on her sacrum and heel are pressure ulcers while the area on the right lateral leg was a pressure injury from an orthopedic brace. Objective Constitutional Patient is hypotensive. She appears stable however.  Pulse regular and within target range for patient.Marland Kitchen Respirations regular, non-labored and within target range.. Temperature is normal and within the target range for the patient.. Vitals Time Taken: 1:41 PM, Temperature: 97.9 F, Pulse: 91 bpm, Respiratory Rate: 16 breaths/min, Blood Pressure: 90/69 mmHg. Cardiovascular Pedal pulses palpable and strong bilaterally.. General Notes: Wound exam; the patient has necrotic material over the left heel. Using pickups and a scalpel I remove necrotic surface of this. This is a deep wound which approaches bone although I think there may be a viable surface that we can work with post debridement. The area on the right leg laterally looks improved although there is still some visible tendon the area again was debrided with pickups and a scalpel The area over the lower sacral looks better Integumentary (Hair, Skin) Wound #5 status is Open. Original cause of wound was Pressure Injury. The wound is located on the Sacrum. The wound measures 1cm length x 2.2cm width x 0.1cm depth; 1.728cm^2 area and 0.173cm^3 volume. The wound is limited to skin breakdown. There is no tunneling or undermining noted. There is a large amount of serous drainage noted. The wound margin is flat and intact. There is large (67-100%) pink granulation within the wound bed.  There is no necrotic tissue within the wound bed. The periwound skin [name], [name] ([id number]) appearance did not exhibit: Callus, Crepitus, Excoriation, Induration, Rash, Scarring, Dry/Scaly, Maceration, Atrophie Blanche, Cyanosis, Ecchymosis, Hemosiderin Staining, Mottled, Pallor, Rubor, Erythema. The periwound has tenderness on palpation. Wound #6 status is Open. Original cause of wound was Pressure Injury. The wound is located on the Left Calcaneus. The wound measures 3.2cm length x 5cm width x 0.4cm depth; 12.566cm^2 area and 5.027cm^3 volume. The wound is limited to skin breakdown. There is a large amount of serous drainage noted. The wound margin is flat and intact. There is no granulation within the wound bed. There is a large (67-100%) amount of necrotic tissue within the wound bed including Eschar. The periwound skin appearance did not exhibit: Callus, Crepitus, Excoriation, Induration, Rash, Scarring, Dry/Scaly, Maceration, Atrophie Blanche, Cyanosis, Ecchymosis, Hemosiderin Staining, Mottled, Pallor, Rubor, Erythema. The periwound has tenderness on palpation. Wound #7 status is Open. Original cause of wound was Pressure Injury. The wound is located on the Right,Proximal,Lateral Lower Leg. The wound measures 1.5cm length x 0.9cm width x 0.1cm depth; 1.06cm^2 area and 0.106cm^3 volume. The wound is limited to skin breakdown. There is no tunneling or undermining noted. There is a large amount of serous drainage noted. The wound margin is flat and intact. There is large (67-100%) pink granulation within the wound bed. There is a small (1-33%) amount of necrotic tissue within the wound bed including Adherent Slough. The periwound skin appearance did not exhibit: Callus, Crepitus, Excoriation, Induration, Rash, Scarring, Dry/Scaly, Maceration, Atrophie Blanche, Cyanosis, Ecchymosis, Hemosiderin Staining, Mottled, Pallor, Rubor, Erythema. Periwound temperature was noted as No Abnormality.  The periwound has tenderness on palpation. Wound #8 status is Open. Original cause of wound was Pressure Injury. The wound is located on the Right,Distal,Lateral Lower Leg. The wound measures 12.1cm length x 2.6cm width x 0.6cm depth; 24.709cm^2 area and 14.825cm^3 volume. There is tendon exposed. There is no tunneling or undermining noted. There is a large amount of serous drainage noted. The wound margin is flat and intact. There is medium (34-66%) pink granulation within the wound bed. There is a small (1-33%) amount of necrotic tissue within the wound bed including Eschar. The periwound skin appearance did not exhibit: Callus, Crepitus,  Excoriation, Induration, Rash, Scarring, Dry/Scaly, Maceration, Atrophie Blanche, Cyanosis, Ecchymosis, Hemosiderin Staining, Mottled, Pallor, Rubor, Erythema. Periwound temperature was noted as No Abnormality. The periwound has tenderness on palpation. 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 ([id number]) Procedures Wound #6 Wound #6 is a Pressure Ulcer located on the Left Calcaneus . There was a Skin/Subcutaneous Tissue Debridement ([id number]) debridement with total area of 16 sq cm performed by [name]. with the following instrument(s): Blade and Forceps to remove Viable and Non-Viable tissue/material including Fibrin/Slough, Eschar, and Subcutaneous after achieving pain control using Lidocaine 4% Topical Solution. A time out was conducted at 14:21, prior to the start of the procedure. A Minimum amount of bleeding was controlled with Pressure. The procedure was tolerated well with a pain level of 0 throughout and a pain level of 0 following the procedure. Post Debridement Measurements: 3.2cm length x 5cm width x 0.6cm depth; 7.54cm^3 volume. Post  debridement Stage noted as Unstageable/Unclassified. Character of Wound/Ulcer Post Debridement is improved. Severity of Tissue Post Debridement is: Muscle involvement without necrosis. Post procedure Diagnosis Wound #6: Same as Pre-Procedure Wound #8 Wound #8 is a Pressure Ulcer located on the Right,Distal,Lateral Lower Leg . There was a Skin/Subcutaneous Tissue/Muscle Debridement ([id number]) debridement with total area of 31.46 sq cm performed by [name]. with the following instrument(s): Blade and Forceps to remove Viable and Non-Viable tissue/material including Fibrin/Slough, Eschar, Tendon, and Subcutaneous after achieving pain control using Lidocaine 4% Topical Solution. A time out was conducted at 14:18, prior to the start of the procedure. A Minimum amount of bleeding was controlled with Pressure. The procedure was tolerated well with a pain level of 0 throughout and a pain level of 0 following the procedure. Post Debridement Measurements: 12.1cm length x 2.6cm width x 0.6cm depth; 14.825cm^3 volume. Post debridement Stage noted as Category/Stage IV. Character of Wound/Ulcer Post Debridement is improved. Severity of Tissue Post Debridement is: Other severity specified. Post procedure Diagnosis Wound #8: Same as Pre-Procedure Plan Wound Cleansing: Wound #5 Sacrum: May shower with protection. - may cleanse all wounds in the shower except wound with tendon showing. Please protect that wound in the shower Wound #6 Left Calcaneus: May shower with protection. - may cleanse all wounds in the shower except wound with tendon showing. Please protect that wound in the shower Wound #7 Right,Proximal,Lateral Lower Leg: May shower with protection. - may cleanse all wounds in the shower except wound with tendon showing. Please protect that wound in the shower Wound #8 Right,Distal,Lateral Lower Leg: May shower with protection. - may cleanse all wounds in the shower except wound  with tendon showing. Rodarte, Denice B. ([id number]) Please protect that wound in the shower Anesthetic: Wound #5 Sacrum: Topical Lidocaine 4% cream applied to wound bed prior to debridement Wound #6 Left Calcaneus: Topical Lidocaine 4% cream applied to wound bed prior to debridement Wound #7 Right,Proximal,Lateral Lower Leg: Topical Lidocaine 4% cream applied to wound bed prior to debridement Wound #8 Right,Distal,Lateral Lower Leg: Topical Lidocaine 4% cream applied to wound bed prior to debridement Primary Wound Dressing: Wound #5 Sacrum: Prisma Ag Wound #6 Left Calcaneus: Santyl Ointment Wound #7 Right,Proximal,Lateral Lower Leg: Prisma Ag - hydrogel to protect tendon Wound #8 Right,Distal,Lateral Lower Leg: Prisma Ag Secondary Dressing: Wound #5 Sacrum: Dry Gauze Boardered Foam Dressing Wound #6 Left Calcaneus:  ABD and Kerlix/Conform Wound #7 Right,Proximal,Lateral Lower Leg: ABD and Kerlix/Conform Wound #8 Right,Distal,Lateral Lower Leg: ABD and Kerlix/Conform Dressing Change Frequency: Wound #5 Sacrum: Change dressing every day. Wound #6 Left Calcaneus: Change dressing every other day. Wound #7 Right,Proximal,Lateral Lower Leg: Change dressing every day. Wound #8 Right,Distal,Lateral Lower Leg: Change dressing every day. Follow-up Appointments: Wound #5 Sacrum: Return Appointment in 2 weeks. Wound #6 Left Calcaneus: Return Appointment in 2 weeks. Wound #7 Right,Proximal,Lateral Lower Leg: Return Appointment in 2 weeks. Wound #8 Right,Distal,Lateral Lower Leg: Return Appointment in 2 weeks. Edema Control: Wound #7 Right,Proximal,Lateral Lower Leg: Kerlix and Coban - Right Lower Extremity - wrap from toes to 3cm below the knee Wound #8 Right,Distal,Lateral Lower Leg: Quackenbush, Eli B. ([id number]) Kerlix and Coban - Right Lower Extremity - wrap from toes to 3cm below the knee Additional Orders / Instructions: Wound #5 Sacrum: Increase protein intake. Wound #6  Left Calcaneus: Increase protein intake. Wound #7 Right,Proximal,Lateral Lower Leg: Increase protein intake. Wound #8 Right,Distal,Lateral Lower Leg: Increase protein intake. #1 we'll continue with Santyl based dressings to the left heel #2 continue with Prisma to the right lateral leg #3 continue with Prisma to the lower sacral wound. #4 Apligraf through the insurance predominantly for the right leg wound. The alternative here would be TheraSkin #5 I've been following her on a biweekly basis Electronic Signature(s) Signed: 03/07/2017 12:39:03 PM By: Michelle Cool RN, BSN, Kim RN, BSN Signed: 03/13/2017 7:39:42 AM By: Michelle Ham [name] Previous Signature: 02/27/2017 5:13:56 PM Version By: Michelle Ham [name] Entered By: Michelle Cool RN, BSN, [name] on 03/07/2017 12:39:03 Hauk, [name] ([id number]) -------------------------------------------------------------------------------- [facility] Details Patient Name: Sachdev, Angellina B. Date of Service: 02/27/2017 Medical Record Patient Account Number: [id number] Number: Treating RN: [name] 1941-02-17 (75 y.o. 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 Facility Procedures CPT4 Code Description: 49449675 11043 - DEB MUSC/FASCIA 20 SQ CM/< ICD-10 Description Diagnosis L89.620 Pressure ulcer of left heel, unstageable L97.213 Non-pressure chronic ulcer of right calf with necro Modifier: sis of muscl Quantity: 1 e CPT4 Code Description: [id number] 11045 - DEB SUBQ TISS EA ADDL 20CM ICD-10 Description Diagnosis L97.213 Non-pressure chronic ulcer of right calf with necro L89.620 Pressure  ulcer of left heel, unstageable Modifier: sis of muscl Quantity: 2 e Physician Procedures CPT4 Code Description: [id number] 11043 - WC PHYS DEBR MUSCLE/FASCIA 20 SQ CM ICD-10 Description Diagnosis L89.620 Pressure ulcer of left heel, unstageable L97.213 Non-pressure chronic ulcer of right calf with necros Modifier: is of muscle Quantity: 1 CPT4 Code Description: [id number] 30092 - WC PHYS SUBQ TISS EA ADDL 20 CM ICD-10 Description Diagnosis L97.213 Non-pressure chronic ulcer of right calf with necros L89.620 Pressure ulcer of left heel, unstageable Rensch, Hadli B. ([id number]) Modifier: is of muscle Quantity: 2 Electronic Signature(s) Signed: 03/07/2017 12:39:36 PM By: Michelle Cool RN, BSN, Kim RN, BSN Signed: 03/13/2017 7:39:42 AM By: Michelle Ham [name] Previous Signature: 02/27/2017 5:13:56 PM Version By: Michelle Ham [name] Entered By: Michelle Cool RN, BSN, [name] on 03/07/2017 12:39:36

## 2017-03-01 NOTE — Progress Notes (Signed)
DAIJAH, SCRIVENS ([id number]) Visit Report for 02/27/2017 Arrival Information Details Patient Name: Mulhearn, Anaisabel B. Date of Service: 02/27/2017 1:30 PM Medical Record Patient Account Number: [id number] Number: Treating RN: Montey Hora [birth year] (76 y.o. Other Clinician: Date of Birth/Sex: Female) Treating ROBSON, MICHAEL Primary Care Jrue Jarriel: Frazier Richards Coe Angelos/Extender: G Referring Sonu Kruckenberg: Jenelle Mages in Treatment: 4 Visit Information History Since Last Visit Added or deleted any medications: No Patient Arrived: Wheel Chair Any new allergies or adverse reactions: No Arrival Time: 13:39 Had a fall or experienced change in No activities of daily living that may affect Accompanied By: son risk of falls: Transfer Assistance: [employer] streamer Signs or symptoms of abuse/neglect since last No Patient Identification Verified: Yes visito Secondary Verification Process Yes Hospitalized since last visit: No Completed: Has Dressing in Place as Prescribed: Yes Pain Present Now: No Electronic Signature(s) Signed: 02/27/2017 5:10:19 PM By: Montey Hora Entered By: Montey Hora on 02/27/2017 13:40:14 Vanderberg, Raul Del ([id number]) -------------------------------------------------------------------------------- Encounter Discharge Information Details Patient Name: Garate, Shanele B. Date of Service: 02/27/2017 1:30 PM Medical Record Patient Account Number: [id number] Number: Treating RN: Montey Hora [birth year] (76 y.o. Other Clinician: Date of Birth/Sex: Female) Treating ROBSON, MICHAEL Primary Care Jenin Birdsall: Frazier Richards Rishabh Rinkenberger/Extender: G Referring Janelle Spellman: Jenelle Mages in Treatment: 4 Encounter Discharge Information Items Discharge Pain Level: 0 Discharge Condition: Stable Ambulatory Status: Wheelchair Discharge Destination: Nursing Home Transportation: Private Auto Accompanied By: son Schedule Follow-up Appointment:  Yes Medication Reconciliation completed and provided to Patient/Care No Renaldo Gornick: Provided on Clinical Summary of Care: 02/27/2017 Form Type Recipient Paper Patient EM Electronic Signature(s) Signed: 02/27/2017 3:28:57 PM By: Montey Hora Previous Signature: 02/27/2017 2:58:36 PM Version By: Ruthine Dose Entered By: Montey Hora on 02/27/2017 15:28:57 Pelto, Raul Del ([id number]) -------------------------------------------------------------------------------- Multi Wound Chart Details Patient Name: Braziel, Hatice B. Date of Service: 02/27/2017 1:30 PM Medical Record Patient Account Number: [id number] Number: Treating RN: Montey Hora 03-05-1941 (76 y.o. Other Clinician: Date of Birth/Sex: Female) Treating ROBSON, MICHAEL Primary Care Jarika Robben: Frazier Richards Haron Beilke/Extender: G Referring Jacqualin Shirkey: Jenelle Mages in Treatment: 4 Vital Signs Height(in): Pulse(bpm): 91 Weight(lbs): Blood Pressure 90/69 (mmHg): Body Mass Index(BMI): Temperature(F): 97.9 Respiratory Rate 16 (breaths/min): Photos: [5:No Photos] [6:No Photos] [7:No Photos] Wound Location: [5:Sacrum] [6:Left Calcaneus] [7:Right Lower Leg - Lateral, Proximal] Wounding Event: [5:Pressure Injury] [6:Pressure Injury] [7:Pressure Injury] Primary Etiology: [5:Pressure Ulcer] [6:Pressure Ulcer] [7:Pressure Ulcer] Comorbid History: [5:Cataracts, Arrhythmia, History of pressure wounds, Gout, Seizure Disorder] [6:Cataracts, Arrhythmia, History of pressure wounds, Gout, Seizure Disorder] [7:Cataracts, Arrhythmia, History of pressure wounds, Gout, Seizure Disorder] Date Acquired: [5:10/22/2016] [6:10/22/2016] [7:10/22/2016] Weeks of Treatment: [5:4] [6:4] [7:4] Wound Status: [5:Open] [6:Open] [7:Open] Measurements L x W x D 1x2.2x0.1 [6:3.2x5x0.4] [7:1.5x0.9x0.1] (cm) Area (cm) : [5:1.728] [6:12.566] [7:1.06] Volume (cm) : [5:0.173] [6:5.027] [7:0.106] % Reduction in Area: [5:67.40%]  [6:5.90%] [7:51.80%] % Reduction in Volume: 67.40% [6:-88.30%] [7:51.80%] Classification: [5:Category/Stage II] [6:Unstageable/Unclassified] [7:Category/Stage II] Exudate Amount: [5:Large] [6:Large] [7:Large] Exudate Type: [5:Serous] [6:Serous] [7:Serous] Exudate Color: [5:amber] [6:amber] [7:amber] Wound Margin: [5:Flat and Intact] [6:Flat and Intact] [7:Flat and Intact] Granulation Amount: [5:Large (67-100%)] [6:None Present (0%)] [7:Large (67-100%)] Granulation Quality: [5:Pink] [6:N/A] [7:Pink] Necrotic Amount: [5:None Present (0%)] [6:Large (67-100%)] [7:Small (1-33%)] Necrotic Tissue: [5:N/A] [6:Eschar] [7:Adherent Slough] Exposed Structures: Bagot, Caterina B. ([id number]) Fascia: No Fascia: No Fascia: No Fat Layer (Subcutaneous Fat Layer (Subcutaneous Fat Layer (Subcutaneous Tissue) Exposed: No Tissue) Exposed: No Tissue) Exposed: No Tendon: No Tendon: No Tendon: No Muscle: No Muscle: No Muscle: No Joint: No Joint: No Joint: No  Bone: No Bone: No Bone: No Limited to Skin Limited to Skin Limited to Skin Breakdown Breakdown Breakdown Epithelialization: None None None Debridement: N/A Debridement ([id number] N/A 11047) Pre-procedure N/A 14:21 N/A Verification/Time Out Taken: Pain Control: N/A Lidocaine 4% Topical N/A Solution Tissue Debrided: N/A Necrotic/Eschar, N/A Fibrin/Slough, Subcutaneous Level: N/A Skin/Subcutaneous N/A Tissue Debridement Area (sq N/A 16 N/A cm): Instrument: N/A Blade, Forceps N/A Bleeding: N/A Minimum N/A Hemostasis Achieved: N/A Pressure N/A Procedural Pain: N/A 0 N/A Post Procedural Pain: N/A 0 N/A Debridement Treatment N/A Procedure was tolerated N/A Response: well Post Debridement N/A 3.2x5x0.6 N/A Measurements L x W x D (cm) Post Debridement N/A 7.54 N/A Volume: (cm) Post Debridement N/A Unstageable/Unclassified N/A Stage: Periwound Skin Texture: Excoriation: No Excoriation: No Excoriation: No Induration: No Induration:  No Induration: No Callus: No Callus: No Callus: No Crepitus: No Crepitus: No Crepitus: No Rash: No Rash: No Rash: No Scarring: No Scarring: No Scarring: No Periwound Skin Maceration: No Maceration: No Maceration: No Moisture: Dry/Scaly: No Dry/Scaly: No Dry/Scaly: No Periwound Skin Color: Atrophie Blanche: No Atrophie Blanche: No Atrophie Blanche: No Cyanosis: No Cyanosis: No Cyanosis: No Ecchymosis: No Ecchymosis: No Ecchymosis: No Erythema: No Erythema: No Erythema: No Trupiano, Naiyah B. ([id number]) Hemosiderin Staining: No Hemosiderin Staining: No Hemosiderin Staining: No Mottled: No Mottled: No Mottled: No Pallor: No Pallor: No Pallor: No Rubor: No Rubor: No Rubor: No Temperature: N/A N/A No Abnormality Tenderness on Yes Yes Yes Palpation: Wound Preparation: Ulcer Cleansing: Ulcer Cleansing: Ulcer Cleansing: Rinsed/Irrigated with Rinsed/Irrigated with Rinsed/Irrigated with Saline Saline Saline Topical Anesthetic Topical Anesthetic Topical Anesthetic Applied: Other: lidocaine Applied: Other: lidocaine Applied: Other: lidocaine 4% 4% 4% Procedures Performed: N/A Debridement N/A Wound Number: 8 N/A N/A Photos: No Photos N/A N/A Wound Location: Right Lower Leg - Lateral, N/A N/A Distal Wounding Event: Pressure Injury N/A N/A Primary Etiology: Pressure Ulcer N/A N/A Comorbid History: Cataracts, Arrhythmia, N/A N/A History of pressure wounds, Gout, Seizure Disorder Date Acquired: 10/22/2016 N/A N/A Weeks of Treatment: 4 N/A N/A Wound Status: Open N/A N/A Measurements L x W x D 12.1x2.6x0.6 N/A N/A (cm) Area (cm) : 24.709 N/A N/A Volume (cm) : 14.825 N/A N/A % Reduction in Area: 23.70% N/A N/A % Reduction in Volume: -14.40% N/A N/A Classification: Category/Stage IV N/A N/A Exudate Amount: Large N/A N/A Exudate Type: Serous N/A N/A Exudate Color: amber N/A N/A Wound Margin: Flat and Intact N/A N/A Granulation Amount: Medium (34-66%) N/A  N/A Granulation Quality: Pink N/A N/A Necrotic Amount: Small (1-33%) N/A N/A Necrotic Tissue: Eschar N/A N/A Exposed Structures: Tendon: Yes N/A N/A Fascia: No Fat Layer (Subcutaneous Tissue) Exposed: No Muscle: No Dorce, Javonne B. ([id number]) Joint: No Bone: No Epithelialization: None N/A N/A Debridement: Debridement ([id number]- N/A N/A 11047) Pre-procedure 14:18 N/A N/A Verification/Time Out Taken: Pain Control: Lidocaine 4% Topical N/A N/A Solution Tissue Debrided: Necrotic/Eschar, N/A N/A Fibrin/Slough, Tendon, Subcutaneous Level: Skin/Subcutaneous N/A N/A Tissue Debridement Area (sq 31.46 N/A N/A cm): Instrument: Blade, Forceps N/A N/A Bleeding: Minimum N/A N/A Hemostasis Achieved: Pressure N/A N/A Procedural Pain: 0 N/A N/A Post Procedural Pain: 0 N/A N/A Debridement Treatment Procedure was tolerated N/A N/A Response: well Post Debridement 12.1x2.6x0.6 N/A N/A Measurements L x W x D (cm) Post Debridement 14.825 N/A N/A Volume: (cm) Post Debridement Category/Stage IV N/A N/A Stage: Periwound Skin Texture: Excoriation: No N/A N/A Induration: No Callus: No Crepitus: No Rash: No Scarring: No Periwound Skin Maceration: No N/A N/A Moisture: Dry/Scaly: No Periwound Skin Color: Atrophie Blanche: No N/A N/A Cyanosis: No  Ecchymosis: No Erythema: No Hemosiderin Staining: No Mottled: No Pallor: No Rubor: No Temperature: No Abnormality N/A N/A Tenderness on Yes N/A N/A Palpation: Koontz, Chereese B. ([id number]) Wound Preparation: Ulcer Cleansing: N/A N/A Rinsed/Irrigated with Saline Topical Anesthetic Applied: Other: lidocaine 4% Procedures Performed: Debridement N/A N/A Treatment Notes Electronic Signature(s) Signed: 02/27/2017 5:13:56 PM By: Linton Ham MD Entered By: Linton Ham on 02/27/2017 15:10:58 Baxley, Raul Del ([id number]) -------------------------------------------------------------------------------- [place]  Details Patient Name: Enamorado, Thanya B. Date of Service: 02/27/2017 1:30 PM Medical Record Patient Account Number: [id number] Number: Treating RN: Montey Hora 1941/10/31 (75 y.o. Other Clinician: Date of Birth/Sex: Female) Treating ROBSON, MICHAEL Primary Care Teller Wakefield: Frazier Richards Carleta Woodrow/Extender: G Referring Fifi Schindler: Jenelle Mages in Treatment: 4 Active Inactive ` Nutrition Nursing Diagnoses: Potential for alteratiion in Nutrition/Potential for imbalanced nutrition Goals: Patient/caregiver agrees to and verbalizes understanding of need to use nutritional supplements and/or vitamins as prescribed Date Initiated: 01/30/2017 Target Resolution Date: 04/25/2017 Goal Status: Active Interventions: Assess patient nutrition upon admission and as needed per policy Notes: ` Orientation to the Wound Care Program Nursing Diagnoses: Knowledge deficit related to the wound healing center program Goals: Patient/caregiver will verbalize understanding of the [place] Date Initiated: 01/30/2017 Target Resolution Date: 02/22/2017 Goal Status: Active Interventions: Provide education on orientation to the wound center Notes: ` Pressure Dresner, Junette B. ([id number]) Nursing Diagnoses: Potential for impaired tissue integrity related to pressure, friction, moisture, and shear Goals: Patient will remain free from development of additional pressure ulcers Date Initiated: 01/30/2017 Target Resolution Date: 04/26/2017 Goal Status: Active Interventions: Assess: immobility, friction, shearing, incontinence upon admission and as needed Notes: ` Wound/Skin Impairment Nursing Diagnoses: Impaired tissue integrity Goals: Patient/caregiver will verbalize understanding of skin care regimen Date Initiated: 01/30/2017 Target Resolution Date: 04/26/2017 Goal Status: Active Ulcer/skin breakdown will have a volume reduction of 30% by week 4 Date Initiated:  01/30/2017 Target Resolution Date: 04/26/2017 Goal Status: Active Ulcer/skin breakdown will have a volume reduction of 50% by week 8 Date Initiated: 01/30/2017 Target Resolution Date: 04/26/2017 Goal Status: Active Ulcer/skin breakdown will have a volume reduction of 80% by week 12 Date Initiated: 01/30/2017 Target Resolution Date: 04/26/2017 Goal Status: Active Ulcer/skin breakdown will heal within 14 weeks Date Initiated: 01/30/2017 Target Resolution Date: 04/26/2017 Goal Status: Active Interventions: Assess patient/caregiver ability to obtain necessary supplies Assess patient/caregiver ability to perform ulcer/skin care regimen upon admission and as needed Assess ulceration(s) every visit Notes: Electronic Signature(s) Signed: 02/27/2017 5:10:19 PM By: Tacy Learn, Raul Del ([id number]) Entered By: Montey Hora on 02/27/2017 14:20:14 Amsden, Raul Del ([id number]) -------------------------------------------------------------------------------- Pain Assessment Details Patient Name: Phagan, Verla B. Date of Service: 02/27/2017 1:30 PM Medical Record Patient Account Number: [id number] Number: Treating RN: Montey Hora 1941/08/30 (75 y.o. Other Clinician: Date of Birth/Sex: Female) Treating ROBSON, MICHAEL Primary Care Lashaundra Lehrmann: Frazier Richards Mahlik Lenn/Extender: G Referring Xiadani Damman: Jenelle Mages in Treatment: 4 Active Problems Location of Pain Severity and Description of Pain Patient Has Paino Patient Unable to Respond Site Locations Pain Management and Medication Current Pain Management: Electronic Signature(s) Signed: 02/27/2017 5:10:19 PM By: Montey Hora Entered By: Montey Hora on 02/27/2017 13:40:52 Diprima, Raul Del ([id number]) -------------------------------------------------------------------------------- Patient/Caregiver Education Details Patient Name: Canady, Ailyne B. Date of Service: 02/27/2017 1:30 PM Medical Record Patient Account  Number: [id number] Number: Treating RN: Montey Hora 02-Jan-1941 (75 y.o. Other Clinician: Date of Birth/Gender: Female) Treating ROBSON, MICHAEL Primary Care Physician: Frazier Richards Physician/Extender: G Referring Physician: Jenelle Mages in Treatment: 4 Education Assessment  Education Provided To: Caregiver Education Topics Provided Wound/Skin Impairment: Handouts: Other: wound care as ordered Methods: Demonstration, Explain/Verbal, Printed Responses: State content correctly Electronic Signature(s) Signed: 02/27/2017 5:10:19 PM By: Montey Hora Entered By: Montey Hora on 02/27/2017 15:29:58 Farve, Raul Del ([id number]) -------------------------------------------------------------------------------- Wound Assessment Details Patient Name: Helf, Tyrone B. Date of Service: 02/27/2017 1:30 PM Medical Record Patient Account Number: [id number] Number: Treating RN: Montey Hora Mar 27, 1941 (75 y.o. Other Clinician: Date of Birth/Sex: Female) Treating ROBSON, MICHAEL Primary Care Kyelle Urbas: Frazier Richards Andra Heslin/Extender: G Referring Mikaya Bunner: Jenelle Mages in Treatment: 4 Wound Status Wound Number: 5 Primary Pressure Ulcer Etiology: Wound Location: Sacrum Wound Open Wounding Event: Pressure Injury Status: Date Acquired: 10/22/2016 Comorbid Cataracts, Arrhythmia, History of Weeks Of Treatment: 4 History: pressure wounds, Gout, Seizure Clustered Wound: No Disorder Photos Photo Uploaded By: Montey Hora on 02/27/2017 16:59:57 Wound Measurements Length: (cm) 1 % Reduction in A Width: (cm) 2.2 % Reduction in V Depth: (cm) 0.1 Epithelializatio Area: (cm) 1.728 Tunneling: Volume: (cm) 0.173 Undermining: rea: 67.4% olume: 67.4% n: None No No Wound Description Classification: Category/Stage II Foul Odor After Wound Margin: Flat and Intact Slough/Fibrino Exudate Amount: Large Exudate Type: Serous Exudate  Color: amber Cleansing: No Yes Wound Bed Granulation Amount: Large (67-100%) Exposed Structure Granulation Quality: Pink Fascia Exposed: No Kelch, Darlina B. ([id number]) Necrotic Amount: None Present (0%) Fat Layer (Subcutaneous Tissue) Exposed: No Tendon Exposed: No Muscle Exposed: No Joint Exposed: No Bone Exposed: No Limited to Skin Breakdown Periwound Skin Texture Texture Color No Abnormalities Noted: No No Abnormalities Noted: No Callus: No Atrophie Blanche: No Crepitus: No Cyanosis: No Excoriation: No Ecchymosis: No Induration: No Erythema: No Rash: No Hemosiderin Staining: No Scarring: No Mottled: No Pallor: No Moisture Rubor: No No Abnormalities Noted: No Dry / Scaly: No Temperature / Pain Maceration: No Tenderness on Palpation: Yes Wound Preparation Ulcer Cleansing: Rinsed/Irrigated with Saline Topical Anesthetic Applied: Other: lidocaine 4%, Treatment Notes Wound #5 (Sacrum) 1. Cleansed with: Clean wound with Normal Saline 2. Anesthetic Topical Lidocaine 4% cream to wound bed prior to debridement 4. Dressing Applied: Prisma Ag 5. Secondary Dressing Applied Bordered Foam Dressing Electronic Signature(s) Signed: 02/27/2017 5:10:19 PM By: Montey Hora Entered By: Montey Hora on 02/27/2017 14:05:29 Riege, Raul Del ([id number]) -------------------------------------------------------------------------------- Wound Assessment Details Patient Name: Samford, Aleia B. Date of Service: 02/27/2017 1:30 PM Medical Record Patient Account Number: [id number] Number: Treating RN: Montey Hora 1941/08/03 (75 y.o. Other Clinician: Date of Birth/Sex: Female) Treating ROBSON, Fords Primary Care Hazim Treadway: Frazier Richards Willia Lampert/Extender: G Referring Ladell Bey: Jenelle Mages in Treatment: 4 Wound Status Wound Number: 6 Primary Pressure Ulcer Etiology: Wound Location: Left Calcaneus Wound Open Wounding Event: Pressure  Injury Status: Date Acquired: 10/22/2016 Comorbid Cataracts, Arrhythmia, History of Weeks Of Treatment: 4 History: pressure wounds, Gout, Seizure Clustered Wound: No Disorder Photos Photo Uploaded By: Montey Hora on 02/27/2017 17:00:30 Wound Measurements Length: (cm) 3.2 % Reduction in A Width: (cm) 5 % Reduction in V Depth: (cm) 0.4 Epithelializatio Area: (cm) 12.566 Volume: (cm) 5.027 rea: 5.9% olume: -88.3% n: None Wound Description Classification: Unstageable/Unclassified Foul Odor Afte Wound Margin: Flat and Intact Slough/Fibrino Exudate Amount: Large Exudate Type: Serous Exudate Color: amber r Cleansing: No No Wound Bed Granulation Amount: None Present (0%) Exposed Structure Necrotic Amount: Large (67-100%) Fascia Exposed: No Lento, Kyliah B. ([id number]) Necrotic Quality: Eschar Fat Layer (Subcutaneous Tissue) Exposed: No Tendon Exposed: No Muscle Exposed: No Joint Exposed: No Bone Exposed: No Limited to Skin Breakdown Periwound Skin Texture Texture Color No Abnormalities  Noted: No No Abnormalities Noted: No Callus: No Atrophie Blanche: No Crepitus: No Cyanosis: No Excoriation: No Ecchymosis: No Induration: No Erythema: No Rash: No Hemosiderin Staining: No Scarring: No Mottled: No Pallor: No Moisture Rubor: No No Abnormalities Noted: No Dry / Scaly: No Temperature / Pain Maceration: No Tenderness on Palpation: Yes Wound Preparation Ulcer Cleansing: Rinsed/Irrigated with Saline Topical Anesthetic Applied: Other: lidocaine 4%, Treatment Notes Wound #6 (Left Calcaneus) 1. Cleansed with: Clean wound with Normal Saline 2. Anesthetic Topical Lidocaine 4% cream to wound bed prior to debridement 4. Dressing Applied: Santyl Ointment 5. Secondary Dressing Applied Guaze, ABD and kerlix/Conform 7. Secured with Tape Other (specify in notes) Notes lightly with coban Electronic Signature(s) Signed: 02/27/2017 5:10:19 PM By: Montey Hora Entered By: Montey Hora on 02/27/2017 13:59:33 Fifer, Raul Del ([id number]) -------------------------------------------------------------------------------- Wound Assessment Details Patient Name: Beyersdorf, Dalisa B. Date of Service: 02/27/2017 1:30 PM Medical Record Patient Account Number: [id number] Number: Treating RN: Montey Hora Mar 24, 1941 (75 y.o. Other Clinician: Date of Birth/Sex: Female) Treating ROBSON, MICHAEL Primary Care Britne Borelli: Frazier Richards Deshayla Empson/Extender: G Referring Dezirea Mccollister: Jenelle Mages in Treatment: 4 Wound Status Wound Number: 7 Primary Pressure Ulcer Etiology: Wound Location: Right Lower Leg - Lateral, Proximal Wound Open Status: Wounding Event: Pressure Injury Comorbid Cataracts, Arrhythmia, History of Date Acquired: 10/22/2016 History: pressure wounds, Gout, Seizure Weeks Of Treatment: 4 Disorder Clustered Wound: No Photos Photo Uploaded By: Montey Hora on 02/27/2017 17:00:31 Wound Measurements Length: (cm) 1.5 % Reduction in A Width: (cm) 0.9 % Reduction in V Depth: (cm) 0.1 Epithelializatio Area: (cm) 1.06 Tunneling: Volume: (cm) 0.106 Undermining: rea: 51.8% olume: 51.8% n: None No No Wound Description Classification: Category/Stage II Foul Odor After Wound Margin: Flat and Intact Slough/Fibrino Exudate Amount: Large Exudate Type: Serous Exudate Color: amber Cleansing: No Yes Wound Bed Granulation Amount: Large (67-100%) Exposed Structure Granulation Quality: Pink Fascia Exposed: No Camino, Laureen B. ([id number]) Necrotic Amount: Small (1-33%) Fat Layer (Subcutaneous Tissue) Exposed: No Necrotic Quality: Adherent Slough Tendon Exposed: No Muscle Exposed: No Joint Exposed: No Bone Exposed: No Limited to Skin Breakdown Periwound Skin Texture Texture Color No Abnormalities Noted: No No Abnormalities Noted: No Callus: No Atrophie Blanche: No Crepitus: No Cyanosis: No Excoriation:  No Ecchymosis: No Induration: No Erythema: No Rash: No Hemosiderin Staining: No Scarring: No Mottled: No Pallor: No Moisture Rubor: No No Abnormalities Noted: No Dry / Scaly: No Temperature / Pain Maceration: No Temperature: No Abnormality Tenderness on Palpation: Yes Wound Preparation Ulcer Cleansing: Rinsed/Irrigated with Saline Topical Anesthetic Applied: Other: lidocaine 4%, Treatment Notes Wound #7 (Right, Proximal, Lateral Lower Leg) 1. Cleansed with: Clean wound with Normal Saline 2. Anesthetic Topical Lidocaine 4% cream to wound bed prior to debridement 4. Dressing Applied: Hydrogel Prisma Ag 5. Secondary Dressing Applied ABD and Kerlix/Conform 7. Secured with Tape Other (specify in notes) Notes coban Electronic Signature(s) Signed: 02/27/2017 5:10:19 PM By: Montey Hora Entered By: Montey Hora on 02/27/2017 14:06:09 Garnett, Raul Del ([id number]) Saravia, Raul Del ([id number]) -------------------------------------------------------------------------------- Wound Assessment Details Patient Name: Picardi, Nadja B. Date of Service: 02/27/2017 1:30 PM Medical Record Patient Account Number: [id number] Number: Treating RN: Montey Hora 1941-09-26 (75 y.o. Other Clinician: Date of Birth/Sex: Female) Treating ROBSON, MICHAEL Primary Care Winter Jocelyn: Frazier Richards Keeven Matty/Extender: G Referring Cloy Cozzens: Jenelle Mages in Treatment: 4 Wound Status Wound Number: 8 Primary Pressure Ulcer Etiology: Wound Location: Right Lower Leg - Lateral, Distal Wound Open Status: Wounding Event: Pressure Injury Comorbid Cataracts, Arrhythmia, History of Date Acquired: 10/22/2016 History: pressure wounds,  Gout, Seizure Weeks Of Treatment: 4 Disorder Clustered Wound: No Photos Photo Uploaded By: Montey Hora on 02/27/2017 17:00:54 Wound Measurements Length: (cm) 12.1 % Reduction in A Width: (cm) 2.6 % Reduction in V Depth: (cm) 0.6  Epithelializatio Area: (cm) 24.709 Tunneling: Volume: (cm) 14.825 Undermining: rea: 23.7% olume: -14.4% n: None No No Wound Description Classification: Category/Stage IV Foul Odor After Wound Margin: Flat and Intact Slough/Fibrino Exudate Amount: Large Exudate Type: Serous Exudate Color: amber Cleansing: No Yes Wound Bed Granulation Amount: Medium (34-66%) Exposed Structure Granulation Quality: Pink Fascia Exposed: No Corcoran, Lafaye B. ([id number]) Necrotic Amount: Small (1-33%) Fat Layer (Subcutaneous Tissue) Exposed: No Necrotic Quality: Eschar Tendon Exposed: Yes Muscle Exposed: No Joint Exposed: No Bone Exposed: No Periwound Skin Texture Texture Color No Abnormalities Noted: No No Abnormalities Noted: No Callus: No Atrophie Blanche: No Crepitus: No Cyanosis: No Excoriation: No Ecchymosis: No Induration: No Erythema: No Rash: No Hemosiderin Staining: No Scarring: No Mottled: No Pallor: No Moisture Rubor: No No Abnormalities Noted: No Dry / Scaly: No Temperature / Pain Maceration: No Temperature: No Abnormality Tenderness on Palpation: Yes Wound Preparation Ulcer Cleansing: Rinsed/Irrigated with Saline Topical Anesthetic Applied: Other: lidocaine 4%, Treatment Notes Wound #8 (Right, Distal, Lateral Lower Leg) 1. Cleansed with: Clean wound with Normal Saline 2. Anesthetic Topical Lidocaine 4% cream to wound bed prior to debridement 4. Dressing Applied: Hydrogel Prisma Ag 5. Secondary Dressing Applied ABD and Kerlix/Conform 7. Secured with Tape Other (specify in notes) Notes coban Electronic Signature(s) Signed: 02/27/2017 5:10:19 PM By: Montey Hora Entered By: Montey Hora on 02/27/2017 14:05:12 Brackin, Raul Del ([id number]) Tokarski, Raul Del ([id number]) -------------------------------------------------------------------------------- Vitals Details Patient Name: Eakins, Electa B. Date of Service: 02/27/2017 1:30 PM Medical Record  Patient Account Number: [id number] Number: Treating RN: Montey Hora 06-07-1941 (75 y.o. Other Clinician: Date of Birth/Sex: Female) Treating ROBSON, MICHAEL Primary Care Zanita Millman: Frazier Richards Amenda Duclos/Extender: G Referring Sydell Prowell: Jenelle Mages in Treatment: 4 Vital Signs Time Taken: 13:41 Temperature (F): 97.9 Pulse (bpm): 91 Respiratory Rate (breaths/min): 16 Blood Pressure (mmHg): 90/69 Reference Range: 80 - 120 mg / dl Electronic Signature(s) Signed: 02/27/2017 5:10:19 PM By: Montey Hora Entered By: Montey Hora on 02/27/2017 13:44:12

## 2017-03-13 ENCOUNTER — Encounter: Payer: Medicare Other | Admitting: Internal Medicine

## 2017-03-13 DIAGNOSIS — L89153 Pressure ulcer of sacral region, stage 3: Secondary | ICD-10-CM | POA: Diagnosis not present

## 2017-03-14 NOTE — Progress Notes (Signed)
CHRISTIONNA, POLAND ([id number]) Visit Report for 03/13/2017 Arrival Information Details Patient Name: Lechtenberg, Zoiee B. Date of Service: 03/13/2017 1:30 PM Medical Record Patient Account Number: [id number] Number: Treating RN: Montey Hora September 20, 1941 (75 y.o. Other Clinician: Date of Birth/Sex: Female) Treating ROBSON, MICHAEL Primary Care Emma Birchler: Frazier Richards Aseel Uhde/Extender: G Referring Kathlene Yano: Jenelle Mages in Treatment: 6 Visit Information History Since Last Visit Added or deleted any medications: No Patient Arrived: Wheel Chair Any new allergies or adverse reactions: No Arrival Time: 13:35 Had a fall or experienced change in No activities of daily living that may affect Accompanied By: son risk of falls: Transfer Assistance: [employer] Signs or symptoms of abuse/neglect since last No Patient Identification Verified: Yes visito Secondary Verification Process Yes Hospitalized since last visit: No Completed: Has Dressing in Place as Prescribed: Yes Pain Present Now: No Electronic Signature(s) Signed: 03/13/2017 5:41:54 PM By: Montey Hora Entered By: Montey Hora on 03/13/2017 13:38:16 Demps, Raul Del ([id number]) -------------------------------------------------------------------------------- Encounter Discharge Information Details Patient Name: Brookshire, Landrey B. Date of Service: 03/13/2017 1:30 PM Medical Record Patient Account Number: [id number] Number: Treating RN: Montey Hora February 12, 1941 (75 y.o. Other Clinician: Date of Birth/Sex: Female) Treating ROBSON, MICHAEL Primary Care Minaal Struckman: Frazier Richards Jalene Lacko/Extender: G Referring Dallyn Bergland: Jenelle Mages in Treatment: 6 Encounter Discharge Information Items Discharge Pain Level: 0 Discharge Condition: Stable Ambulatory Status: Wheelchair Discharge Destination: Nursing Home Transportation: Private Auto Accompanied By: self Schedule Follow-up  Appointment: Yes Medication Reconciliation completed and provided to Patient/Care No Shelsey Rieth: Provided on Clinical Summary of Care: 03/13/2017 Form Type Recipient Paper Patient EM Electronic Signature(s) Signed: 03/13/2017 2:43:08 PM By: Ruthine Dose Entered By: Ruthine Dose on 03/13/2017 14:43:08 [place]cherside, Raul Del ([id number]) -------------------------------------------------------------------------------- Lower Extremity Assessment Details Patient Name: Barsch, Ailee B. Date of Service: 03/13/2017 1:30 PM Medical Record Patient Account Number: [id number] Number: Treating RN: Montey Hora 07-24-1941 (75 y.o. Other Clinician: Date of Birth/Sex: Female) Treating ROBSON, MICHAEL Primary Care Cliffie Gingras: Frazier Richards Lanaysia Fritchman/Extender: G Referring Nasiya Pascual: Jenelle Mages in Treatment: 6 Vascular Assessment Pulses: Dorsalis Pedis Palpable: [Left:Yes] [Right:Yes] Posterior Tibial Extremity colors, hair growth, and conditions: Extremity Color: [Left:Hyperpigmented] [Right:Hyperpigmented] Hair Growth on Extremity: [Left:No] [Right:No] Temperature of Extremity: [Left:Warm] [Right:Warm] Capillary Refill: [Left:< 3 seconds] [Right:< 3 seconds] Electronic Signature(s) Signed: 03/13/2017 5:41:54 PM By: Montey Hora Entered By: Montey Hora on 03/13/2017 14:02:43 Beahm, Raul Del ([id number]) -------------------------------------------------------------------------------- Multi Wound Chart Details Patient Name: Vogan, Delois B. Date of Service: 03/13/2017 1:30 PM Medical Record Patient Account Number: [id number] Number: Treating RN: Montey Hora 04-Jan-1941 (75 y.o. Other Clinician: Date of Birth/Sex: Female) Treating ROBSON, MICHAEL Primary Care Chania Kochanski: Frazier Richards Joreen Swearingin/Extender: G Referring Alfonza Toft: Jenelle Mages in Treatment: 6 Vital Signs Height(in): Pulse(bpm): 71 Weight(lbs): Blood  Pressure 156/90 (mmHg): Body Mass Index(BMI): Temperature(F): 97.4 Respiratory Rate 18 (breaths/min): Photos: [5:No Photos] [6:No Photos] [7:No Photos] Wound Location: [5:Sacrum] [6:Left Calcaneus] [7:Right Lower Leg - Lateral, Proximal] Wounding Event: [5:Pressure Injury] [6:Pressure Injury] [7:Pressure Injury] Primary Etiology: [5:Pressure Ulcer] [6:Pressure Ulcer] [7:Pressure Ulcer] Comorbid History: [5:Cataracts, Arrhythmia, History of pressure wounds, Gout, Seizure Disorder] [6:Cataracts, Arrhythmia, History of pressure wounds, Gout, Seizure Disorder] [7:Cataracts, Arrhythmia, History of pressure wounds, Gout, Seizure Disorder] Date Acquired: [5:10/22/2016] [6:10/22/2016] [7:10/22/2016] Weeks of Treatment: [5:6] [6:6] [7:6] Wound Status: [5:Not Healed] [6:Not Healed] [7:Not Healed] Measurements L x W x D 0.9x1.9x0.1 [6:3x4.4x0.5] [7:0.4x0.5x0.1] (cm) Area (cm) : [5:1.343] [6:10.367] [7:0.157] Volume (cm) : [5:0.134] [6:5.184] [7:0.016] % Reduction in Area: [5:74.70%] [6:22.40%] [7:92.90%] % Reduction in Volume: 74.70% [6:-94.20%] [7:92.70%] Classification: [  5:Category/Stage II] [6:Unstageable/Unclassified] [7:Category/Stage II] Exudate Amount: [5:Large] [6:Large] [7:Large] Exudate Type: [5:Serous] [6:Serous] [7:Serous] Exudate Color: [5:amber] [6:amber] [7:amber] Wound Margin: [5:Flat and Intact] [6:Flat and Intact] [7:Flat and Intact] Granulation Amount: [5:Large (67-100%)] [6:None Present (0%)] [7:Large (67-100%)] Granulation Quality: [5:Pink] [6:N/A] [7:Pink] Necrotic Amount: [5:None Present (0%)] [6:Large (67-100%)] [7:Small (1-33%)] Necrotic Tissue: [5:N/A] [6:Eschar] [7:Adherent Slough] Exposed Structures: Bouldin, Korey B. ([id number]) Fascia: No Fascia: No Fascia: No Fat Layer (Subcutaneous Fat Layer (Subcutaneous Fat Layer (Subcutaneous Tissue) Exposed: No Tissue) Exposed: No Tissue) Exposed: No Tendon: No Tendon: No Tendon: No Muscle: No Muscle: No Muscle:  No Joint: No Joint: No Joint: No Bone: No Bone: No Bone: No Limited to Skin Limited to Skin Limited to Skin Breakdown Breakdown Breakdown Epithelialization: None None None Debridement: N/A Debridement ([id number]) Pre-procedure N/A 14:07 N/A Verification/Time Out Taken: Pain Control: N/A Lidocaine 4% Topical N/A Solution Tissue Debrided: N/A Necrotic/Eschar, N/A Fibrin/Slough, Muscle, Subcutaneous Level: N/A Skin/Subcutaneous N/A Tissue/Muscle Debridement Area (sq N/A 13.2 N/A cm): Instrument: N/A Curette N/A Bleeding: N/A Minimum N/A Hemostasis Achieved: N/A Pressure N/A Procedural Pain: N/A 0 N/A Post Procedural Pain: N/A 0 N/A Debridement Treatment N/A Procedure was tolerated N/A Response: well Post Debridement N/A 3x4.4x0.6 N/A Measurements L x W x D (cm) Post Debridement N/A 6.22 N/A Volume: (cm) Post Debridement N/A Unstageable/Unclassified N/A Stage: Periwound Skin Texture: Excoriation: No Excoriation: No Excoriation: No Induration: No Induration: No Induration: No Callus: No Callus: No Callus: No Crepitus: No Crepitus: No Crepitus: No Rash: No Rash: No Rash: No Scarring: No Scarring: No Scarring: No Periwound Skin Maceration: No Maceration: No Maceration: No Moisture: Dry/Scaly: No Dry/Scaly: No Dry/Scaly: No Periwound Skin Color: Atrophie Blanche: No Atrophie Blanche: No Atrophie Blanche: No Cyanosis: No Cyanosis: No Cyanosis: No Ecchymosis: No Ecchymosis: No Ecchymosis: No Erythema: No Erythema: No Erythema: No Lesch, Ziara B. ([id number]) Hemosiderin Staining: No Hemosiderin Staining: No Hemosiderin Staining: No Mottled: No Mottled: No Mottled: No Pallor: No Pallor: No Pallor: No Rubor: No Rubor: No Rubor: No Temperature: N/A N/A No Abnormality Tenderness on Yes Yes Yes Palpation: Wound Preparation: Ulcer Cleansing: Ulcer Cleansing: Ulcer Cleansing: Rinsed/Irrigated with Rinsed/Irrigated with Rinsed/Irrigated  with Saline Saline Saline Topical Anesthetic Topical Anesthetic Topical Anesthetic Applied: None Applied: Other: lidocaine Applied: None 4% Procedures Performed: N/A Debridement N/A Wound Number: 8 N/A N/A Photos: No Photos N/A N/A Wound Location: Right Lower Leg - Lateral, N/A N/A Distal Wounding Event: Pressure Injury N/A N/A Primary Etiology: Pressure Ulcer N/A N/A Comorbid History: Cataracts, Arrhythmia, N/A N/A History of pressure wounds, Gout, Seizure Disorder Date Acquired: 10/22/2016 N/A N/A Weeks of Treatment: 6 N/A N/A Wound Status: Not Healed N/A N/A Measurements L x W x D 7x1.8x0.4 N/A N/A (cm) Area (cm) : 9.896 N/A N/A Volume (cm) : 3.958 N/A N/A % Reduction in Area: 69.50% N/A N/A % Reduction in Volume: 69.50% N/A N/A Classification: Category/Stage IV N/A N/A Exudate Amount: Large N/A N/A Exudate Type: Serous N/A N/A Exudate Color: amber N/A N/A Wound Margin: Flat and Intact N/A N/A Granulation Amount: Medium (34-66%) N/A N/A Granulation Quality: Pink N/A N/A Necrotic Amount: Small (1-33%) N/A N/A Necrotic Tissue: Eschar N/A N/A Exposed Structures: Tendon: Yes N/A N/A Fascia: No Fat Layer (Subcutaneous Tissue) Exposed: No Muscle: No Ozdemir, Kimela B. ([id number]) Joint: No Bone: No Epithelialization: None N/A N/A Debridement: N/A N/A N/A Pain Control: N/A N/A N/A Tissue Debrided: N/A N/A N/A Level: N/A N/A N/A Debridement Area (sq N/A N/A N/A cm): Instrument: N/A N/A N/A Bleeding: N/A N/A  N/A Hemostasis Achieved: N/A N/A N/A Procedural Pain: N/A N/A N/A Post Procedural Pain: N/A N/A N/A Debridement Treatment N/A N/A N/A Response: Post Debridement N/A N/A N/A Measurements L x W x D (cm) Post Debridement N/A N/A N/A Volume: (cm) Post Debridement N/A N/A N/A Stage: Periwound Skin Texture: Excoriation: No N/A N/A Induration: No Callus: No Crepitus: No Rash: No Scarring: No Periwound Skin Maceration: No N/A N/A Moisture: Dry/Scaly:  No Periwound Skin Color: Atrophie Blanche: No N/A N/A Cyanosis: No Ecchymosis: No Erythema: No Hemosiderin Staining: No Mottled: No Pallor: No Rubor: No Temperature: No Abnormality N/A N/A Tenderness on Yes N/A N/A Palpation: Wound Preparation: Ulcer Cleansing: N/A N/A Rinsed/Irrigated with Saline Topical Anesthetic Applied: Other: lidocaine 4% Procedures Performed: N/A N/A N/A LAYCEE, FITZSIMMONS ([id number]) Treatment Notes Electronic Signature(s) Signed: 03/13/2017 4:33:23 PM By: Linton Ham MD Entered By: Linton Ham on 03/13/2017 14:31:48 Prows, Raul Del ([id number]) -------------------------------------------------------------------------------- [place] Details Patient Name: Fishburn, Coretta B. Date of Service: 03/13/2017 1:30 PM Medical Record Patient Account Number: [id number] Number: Treating RN: Montey Hora [birth year] (75 y.o. Other Clinician: Date of Birth/Sex: Female) Treating ROBSON, MICHAEL Primary Care Eytan Carrigan: Frazier Richards Isabella Roemmich/Extender: G Referring Doral Ventrella: Jenelle Mages in Treatment: 6 Active Inactive ` Nutrition Nursing Diagnoses: Potential for alteratiion in Nutrition/Potential for imbalanced nutrition Goals: Patient/caregiver agrees to and verbalizes understanding of need to use nutritional supplements and/or vitamins as prescribed Date Initiated: 01/30/2017 Target Resolution Date: 04/25/2017 Goal Status: Active Interventions: Assess patient nutrition upon admission and as needed per policy Notes: ` Orientation to the Wound Care Program Nursing Diagnoses: Knowledge deficit related to the wound healing center program Goals: Patient/caregiver will verbalize understanding of the [place] Date Initiated: 01/30/2017 Target Resolution Date: 02/22/2017 Goal Status: Active Interventions: Provide education on orientation to the wound center Notes: ` Pressure Bram, Demisha  B. ([id number]) Nursing Diagnoses: Potential for impaired tissue integrity related to pressure, friction, moisture, and shear Goals: Patient will remain free from development of additional pressure ulcers Date Initiated: 01/30/2017 Target Resolution Date: 04/26/2017 Goal Status: Active Interventions: Assess: immobility, friction, shearing, incontinence upon admission and as needed Notes: ` Wound/Skin Impairment Nursing Diagnoses: Impaired tissue integrity Goals: Patient/caregiver will verbalize understanding of skin care regimen Date Initiated: 01/30/2017 Target Resolution Date: 04/26/2017 Goal Status: Active Ulcer/skin breakdown will have a volume reduction of 30% by week 4 Date Initiated: 01/30/2017 Target Resolution Date: 04/26/2017 Goal Status: Active Ulcer/skin breakdown will have a volume reduction of 50% by week 8 Date Initiated: 01/30/2017 Target Resolution Date: 04/26/2017 Goal Status: Active Ulcer/skin breakdown will have a volume reduction of 80% by week 12 Date Initiated: 01/30/2017 Target Resolution Date: 04/26/2017 Goal Status: Active Ulcer/skin breakdown will heal within 14 weeks Date Initiated: 01/30/2017 Target Resolution Date: 04/26/2017 Goal Status: Active Interventions: Assess patient/caregiver ability to obtain necessary supplies Assess patient/caregiver ability to perform ulcer/skin care regimen upon admission and as needed Assess ulceration(s) every visit Notes: Electronic Signature(s) Signed: 03/13/2017 5:41:54 PM By: Tacy Learn, Raul Del ([id number]) Entered By: Montey Hora on 03/13/2017 14:02:50 Eden, Raul Del ([id number]) -------------------------------------------------------------------------------- Pain Assessment Details Patient Name: Mccubbin, Luna B. Date of Service: 03/13/2017 1:30 PM Medical Record Patient Account Number: [id number] Number: Treating RN: Montey Hora 1941-09-01 (75 y.o. Other Clinician: Date of  Birth/Sex: Female) Treating ROBSON, MICHAEL Primary Care Tyjae Shvartsman: Frazier Richards Monica Codd/Extender: G Referring Delisha Peaden: Jenelle Mages in Treatment: 6 Active Problems Location of Pain Severity and Description of Pain Patient Has Paino No Site Locations Pain Management  and Medication Current Pain Management: Electronic Signature(s) Signed: 03/13/2017 5:41:54 PM By: Montey Hora Entered By: Montey Hora on 03/13/2017 13:51:05 Thor, Raul Del ([id number]) -------------------------------------------------------------------------------- Patient/Caregiver Education Details Patient Name: Kocsis, Gatha B. Date of Service: 03/13/2017 1:30 PM Medical Record Patient Account Number: [id number] Number: Treating RN: Montey Hora January 05, 1941 (75 y.o. Other Clinician: Date of Birth/Gender: Female) Treating ROBSON, MICHAEL Primary Care Physician: Frazier Richards Physician/Extender: G Referring Physician: Jenelle Mages in Treatment: 6 Education Assessment Education Provided To: Caregiver Education Topics Provided Wound/Skin Impairment: Handouts: Other: wound care and theraskin for next week Methods: Demonstration, Explain/Verbal Responses: State content correctly Electronic Signature(s) Signed: 03/13/2017 5:41:54 PM By: Montey Hora Entered By: Montey Hora on 03/13/2017 14:08:17 Bugaj, Raul Del ([id number]) -------------------------------------------------------------------------------- Wound Assessment Details Patient Name: Morken, Kenzleigh B. Date of Service: 03/13/2017 1:30 PM Medical Record Patient Account Number: [id number] Number: Treating RN: Montey Hora Aug 19, 1941 (75 y.o. Other Clinician: Date of Birth/Sex: Female) Treating ROBSON, MICHAEL Primary Care Nysha Koplin: Frazier Richards Lynnelle Mesmer/Extender: G Referring Nakhi Choi: Jenelle Mages in Treatment: 6 Wound Status Wound Number: 5 Primary Pressure  Ulcer Etiology: Wound Location: Sacrum Wound Not Healed Wounding Event: Pressure Injury Status: Date Acquired: 10/22/2016 Comorbid Cataracts, Arrhythmia, History of Weeks Of Treatment: 6 History: pressure wounds, Gout, Seizure Clustered Wound: No Disorder Photos Photo Uploaded By: Montey Hora on 03/13/2017 16:17:31 Wound Measurements Length: (cm) 0.9 % Reduction in A Width: (cm) 1.9 % Reduction in V Depth: (cm) 0.1 Epithelializatio Area: (cm) 1.343 Tunneling: Volume: (cm) 0.134 Undermining: rea: 74.7% olume: 74.7% n: None No No Wound Description Classification: Category/Stage II Foul Odor After Wound Margin: Flat and Intact Slough/Fibrino Exudate Amount: Large Exudate Type: Serous Exudate Color: amber Cleansing: No Yes Wound Bed Granulation Amount: Large (67-100%) Exposed Structure Granulation Quality: Pink Fascia Exposed: No Nading, Lysbeth B. ([id number]) Necrotic Amount: None Present (0%) Fat Layer (Subcutaneous Tissue) Exposed: No Tendon Exposed: No Muscle Exposed: No Joint Exposed: No Bone Exposed: No Limited to Skin Breakdown Periwound Skin Texture Texture Color No Abnormalities Noted: No No Abnormalities Noted: No Callus: No Atrophie Blanche: No Crepitus: No Cyanosis: No Excoriation: No Ecchymosis: No Induration: No Erythema: No Rash: No Hemosiderin Staining: No Scarring: No Mottled: No Pallor: No Moisture Rubor: No No Abnormalities Noted: No Dry / Scaly: No Temperature / Pain Maceration: No Tenderness on Palpation: Yes Wound Preparation Ulcer Cleansing: Rinsed/Irrigated with Saline Topical Anesthetic Applied: None Electronic Signature(s) Signed: 03/13/2017 5:41:54 PM By: Montey Hora Entered By: Montey Hora on 03/13/2017 13:53:12 Hush, Raul Del ([id number]) -------------------------------------------------------------------------------- Wound Assessment Details Patient Name: Buntyn, Chrisanne B. Date of Service: 03/13/2017 1:30  PM Medical Record Patient Account Number: [id number] Number: Treating RN: Montey Hora [birth year] (75 y.o. Other Clinician: Date of Birth/Sex: Female) Treating ROBSON, [place] Primary Care Garan Frappier: Frazier Richards Anzlee Hinesley/Extender: G Referring Citlaly Camplin: Jenelle Mages in Treatment: 6 Wound Status Wound Number: 6 Primary Pressure Ulcer Etiology: Wound Location: Left Calcaneus Wound Not Healed Wounding Event: Pressure Injury Status: Date Acquired: 10/22/2016 Comorbid Cataracts, Arrhythmia, History of Weeks Of Treatment: 6 History: pressure wounds, Gout, Seizure Clustered Wound: No Disorder Photos Photo Uploaded By: Montey Hora on 03/13/2017 16:17:32 Wound Measurements Length: (cm) 3 % Reduction in A Width: (cm) 4.4 % Reduction in V Depth: (cm) 0.5 Epithelializatio Area: (cm) 10.367 Volume: (cm) 5.184 rea: 22.4% olume: -94.2% n: None Wound Description Classification: Unstageable/Unclassified Foul Odor Afte Wound Margin: Flat and Intact Slough/Fibrino Exudate Amount: Large Exudate Type: Serous Exudate Color: amber r Cleansing: No No Wound Bed Granulation Amount: None Present (  0%) Exposed Structure Necrotic Amount: Large (67-100%) Fascia Exposed: No Tuff, Joda B. ([id number]) Necrotic Quality: Eschar Fat Layer (Subcutaneous Tissue) Exposed: No Tendon Exposed: No Muscle Exposed: No Joint Exposed: No Bone Exposed: No Limited to Skin Breakdown Periwound Skin Texture Texture Color No Abnormalities Noted: No No Abnormalities Noted: No Callus: No Atrophie Blanche: No Crepitus: No Cyanosis: No Excoriation: No Ecchymosis: No Induration: No Erythema: No Rash: No Hemosiderin Staining: No Scarring: No Mottled: No Pallor: No Moisture Rubor: No No Abnormalities Noted: No Dry / Scaly: No Temperature / Pain Maceration: No Tenderness on Palpation: Yes Wound Preparation Ulcer Cleansing: Rinsed/Irrigated with Saline Topical  Anesthetic Applied: Other: lidocaine 4%, Electronic Signature(s) Signed: 03/13/2017 5:41:54 PM By: Montey Hora Entered By: Montey Hora on 03/13/2017 13:55:59 Celmer, Raul Del ([id number]) -------------------------------------------------------------------------------- Wound Assessment Details Patient Name: Parkin, Perina B. Date of Service: 03/13/2017 1:30 PM Medical Record Patient Account Number: [id number] Number: Treating RN: Montey Hora [birth year] (75 y.o. Other Clinician: Date of Birth/Sex: Female) Treating ROBSON, MICHAEL Primary Care Griffen Frayne: Frazier Richards Avalin Briley/Extender: G Referring Rees Santistevan: Jenelle Mages in Treatment: 6 Wound Status Wound Number: 7 Primary Pressure Ulcer Etiology: Wound Location: Right Lower Leg - Lateral, Proximal Wound Not Healed Status: Wounding Event: Pressure Injury Comorbid Cataracts, Arrhythmia, History of Date Acquired: 10/22/2016 History: pressure wounds, Gout, Seizure Weeks Of Treatment: 6 Disorder Clustered Wound: No Photos Photo Uploaded By: Montey Hora on 03/13/2017 16:16:54 Wound Measurements Length: (cm) 0.4 % Reduction in A Width: (cm) 0.5 % Reduction in V Depth: (cm) 0.1 Epithelializatio Area: (cm) 0.157 Volume: (cm) 0.016 rea: 92.9% olume: 92.7% n: None Wound Description Classification: Category/Stage II Foul Odor After Wound Margin: Flat and Intact Slough/Fibrino Exudate Amount: Large Exudate Type: Serous Exudate Color: amber Cleansing: No Yes Wound Bed Granulation Amount: Large (67-100%) Exposed Structure Granulation Quality: Pink Fascia Exposed: No Bellew, Lynell B. ([id number]) Necrotic Amount: Small (1-33%) Fat Layer (Subcutaneous Tissue) Exposed: No Necrotic Quality: Adherent Slough Tendon Exposed: No Muscle Exposed: No Joint Exposed: No Bone Exposed: No Limited to Skin Breakdown Periwound Skin Texture Texture Color No Abnormalities Noted: No No Abnormalities  Noted: No Callus: No Atrophie Blanche: No Crepitus: No Cyanosis: No Excoriation: No Ecchymosis: No Induration: No Erythema: No Rash: No Hemosiderin Staining: No Scarring: No Mottled: No Pallor: No Moisture Rubor: No No Abnormalities Noted: No Dry / Scaly: No Temperature / Pain Maceration: No Temperature: No Abnormality Tenderness on Palpation: Yes Wound Preparation Ulcer Cleansing: Rinsed/Irrigated with Saline Topical Anesthetic Applied: None Electronic Signature(s) Signed: 03/13/2017 5:41:54 PM By: Montey Hora Entered By: Montey Hora on 03/13/2017 13:57:10 Ferryman, Raul Del ([id number]) -------------------------------------------------------------------------------- Wound Assessment Details Patient Name: Woodcox, Caelynn B. Date of Service: 03/13/2017 1:30 PM Medical Record Patient Account Number: [id number] Number: Treating RN: Montey Hora Mar 24, 1941 (75 y.o. Other Clinician: Date of Birth/Sex: Female) Treating ROBSON, MICHAEL Primary Care Raesean Bartoletti: Frazier Richards Atiyana Welte/Extender: G Referring Kaine Mcquillen: Jenelle Mages in Treatment: 6 Wound Status Wound Number: 8 Primary Pressure Ulcer Etiology: Wound Location: Right Lower Leg - Lateral, Distal Wound Not Healed Status: Wounding Event: Pressure Injury Comorbid Cataracts, Arrhythmia, History of Date Acquired: 10/22/2016 History: pressure wounds, Gout, Seizure Weeks Of Treatment: 6 Disorder Clustered Wound: No Photos Photo Uploaded By: Montey Hora on 03/13/2017 16:16:55 Wound Measurements Length: (cm) 7 % Reduction in A Width: (cm) 1.8 % Reduction in V Depth: (cm) 0.4 Epithelializatio Area: (cm) 9.896 Volume: (cm) 3.958 rea: 69.5% olume: 69.5% n: None Wound Description Classification: Category/Stage IV Foul Odor After Wound Margin: Flat and Intact Slough/Fibrino  Exudate Amount: Large Exudate Type: Serous Exudate Color: amber Cleansing: No Yes Wound Bed Granulation  Amount: Medium (34-66%) Exposed Structure Granulation Quality: Pink Fascia Exposed: No Bugge, Yecenia B. ([id number]) Necrotic Amount: Small (1-33%) Fat Layer (Subcutaneous Tissue) Exposed: No Necrotic Quality: Eschar Tendon Exposed: Yes Muscle Exposed: No Joint Exposed: No Bone Exposed: No Periwound Skin Texture Texture Color No Abnormalities Noted: No No Abnormalities Noted: No Callus: No Atrophie Blanche: No Crepitus: No Cyanosis: No Excoriation: No Ecchymosis: No Induration: No Erythema: No Rash: No Hemosiderin Staining: No Scarring: No Mottled: No Pallor: No Moisture Rubor: No No Abnormalities Noted: No Dry / Scaly: No Temperature / Pain Maceration: No Temperature: No Abnormality Tenderness on Palpation: Yes Wound Preparation Ulcer Cleansing: Rinsed/Irrigated with Saline Topical Anesthetic Applied: Other: lidocaine 4%, Electronic Signature(s) Signed: 03/13/2017 5:41:54 PM By: Montey Hora Entered By: Montey Hora on 03/13/2017 13:59:08 Level, Raul Del ([id number]) -------------------------------------------------------------------------------- [place] Details Patient Name: Boatman, Salisa B. Date of Service: 03/13/2017 1:30 PM Medical Record Patient Account Number: [id number] Number: Treating RN: Montey Hora 02-08-1941 (75 y.o. Other Clinician: Date of Birth/Sex: Female) Treating ROBSON, MICHAEL Primary Care Jarone Ostergaard: Frazier Richards Anadia Helmes/Extender: G Referring Niema Carrara: Jenelle Mages in Treatment: 6 Vital Signs Time Taken: 13:38 Temperature (F): 97.4 Pulse (bpm): 71 Respiratory Rate (breaths/min): 18 Blood Pressure (mmHg): 156/90 Reference Range: 80 - 120 mg / dl Electronic Signature(s) Signed: 03/13/2017 5:41:54 PM By: Montey Hora Entered By: Montey Hora on 03/13/2017 13:39:21

## 2017-03-14 NOTE — Progress Notes (Signed)
KANIAH, RIZZOLO ([id number]) Visit Report for 03/13/2017 Chief Complaint Document Details Patient Name: [name], [name] Date of Service: 03/13/2017 1:30 PM Medical Record Patient Account Number: [id number] Number: Treating RN: Montey Hora 1941/02/14 (75 y.o. Other Clinician: Date of Birth/Sex: Female) Treating ROBSON, MICHAEL Primary Care Provider: Frazier Richards Provider/Extender: G Referring Provider: Jenelle Mages in Treatment: 6 Information Obtained from: Patient Chief Complaint 01/05/17; this is a 76 year old disabled woman from [facility] who is here for review of multiple wounds. She is accompanied by her son Electronic Signature(s) Signed: 03/13/2017 4:33:23 PM By: Linton Ham MD Entered By: Linton Ham on 03/13/2017 14:32:09 Bouyer, [name] ([id number]) -------------------------------------------------------------------------------- Debridement Details Patient Name: [name], [name] Date of Service: 03/13/2017 1:30 PM Medical Record Patient Account Number: [id number] Number: Treating RN: Montey Hora Jan 30, 1941 (75 y.o. Other Clinician: Date of Birth/Sex: Female) Treating ROBSON, MICHAEL Primary Care Provider: Frazier Richards Provider/Extender: G Referring Provider: Jenelle Mages in Treatment: 6 Debridement Performed for Wound #6 Left Calcaneus Assessment: Performed By: Physician Ricard Dillon, MD Debridement: Debridement Pre-procedure Yes - 14:07 Verification/Time Out Taken: Start Time: 14:07 Pain Control: Lidocaine 4% Topical Solution Level: Skin/Subcutaneous Tissue/Muscle Total Area Debrided (L x 3 (cm) x 4.4 (cm) = 13.2 (cm) W): Tissue and other Viable, Non-Viable, Eschar, Fibrin/Slough, Muscle, Subcutaneous material debrided: Instrument: Curette Bleeding: Minimum Hemostasis Achieved: Pressure End Time: 14:10 Procedural Pain: 0 Post Procedural Pain: 0 Response to Treatment: Procedure  was tolerated well Post Debridement Measurements of Total Wound Length: (cm) 3 Stage: Unstageable/Unclassified Width: (cm) 4.4 Depth: (cm) 0.6 Volume: (cm) 6.22 Character of Wound/Ulcer Post Improved Debridement: Severity of Tissue Post Necrosis of muscle Debridement: Post Procedure Diagnosis Same as Pre-procedure Electronic Signature(s) Signed: 03/13/2017 4:33:23 PM By: Linton Ham MD [name], [name] ([id number]) Signed: 03/13/2017 5:41:54 PM By: Montey Hora Entered By: Linton Ham on 03/13/2017 14:31:57 [name], [name] ([id number]) -------------------------------------------------------------------------------- HPI Details Patient Name: [name], [name] Date of Service: 03/13/2017 1:30 PM Medical Record Patient Account Number: [id number] Number: Treating RN: Montey Hora Dec 12, 1940 (75 y.o. Other Clinician: Date of Birth/Sex: Female) Treating ROBSON, MICHAEL Primary Care Provider: Frazier Richards Provider/Extender: G Referring Provider: Jenelle Mages in Treatment: 6 History of Present Illness HPI Description: 12/26/16; this is a 76 year old woman who is from [facility] skilled facility. She is accompanied by her son who provides most of the history. He tells me he was called in early December to report a fall in the shower room at the facility. An x-ray showed a right supracondylar distal femur fracture. She was put in a brace and treated nonoperatively. She was also admitted to hospital in mid December and discharged on 12/18. During this admission she had sepsis secondary to an acute UTI. In [facility] she is also quoted during that admission as having a left ischial tuberosity wound coccyx wound and a deep tissue injury over the left heel. Her son states that sometime after this and when she returned to see her orthopedic surgeon she developed wound areas under the brace on the right lateral leg have both distally and  a smaller wound superiorly on the lateral right leg. I am not completely certain what the facility is placing on these wounds. The patient has a history of atrial fibrillation. Her son tells me that years ago she had a fairly significant stroke but maintained a good quality of life up until 2 years ago when she developed dementia then things have really been going downhill since. She  is been at a nursing facility for the last 18 months [[facility]] barely her nutritional intake is marginal but improving. Her son states that he is there and a lot of meals and she eats 75%. She is receiving nutritional supplements as well as protein supplements she has a level II pressure-relief surface and bunny boots bilaterally on both feet 01/02/17; patient returns this week. Not much change. I crosshatched the left heel last week they have been applying Santyl. The extensive areas on the right lateral leg especially distally with a large area of exposed tendon we have been applying silver collagen. To her coccyx wound also silver collagen. According to her son who visits her daily and participates in her care her intake is fairly good she drinks well takes her nutritional supplements. She is at [facility] skilled facility. 01/30/17; this is a patient I had seen 2 times last month. She was admitted to hospital from 2/25 through 2/28 with urosepsis and a seizure. She is at [facility]. The son has hospice services there but he uses them more just to add care to his mother rather than for any end-of-life mentality. We therefore of maintained a fairly aggressive posture to order wounds. This is in contradistinction to the last part of her discharge summary in February in which the discharging doctor as a note in bold font expressing that this patient is under hospice care and should be treated as such. I've never found that to be what her son is actually saying in any case she has an unstageable  eschar over the left heel, a large wound on the right calf with extensive exposed tendon and surrounding granulation, a smaller wound on the upper right calf which appears improved and a stage III sacral pressure ulcer which is also improved since the last time I saw her [name], [name] ([id number]) 02/13/17; I spoke to hospice and palliative care of [facility] last week and talked about my perception wound care that'll be necessary for Mrs. this is based on relatively extensive discussions have had with her son who has a strong desire to get these wounds the best chance to heal. We have been using silver collagen on the sacral wound, right lateral lateral leg in 2 spots and Santyl to the left heel. Her son says she is eating better and they are being religious about turning her at the facility with constant supervision of family 02/27/17; since our last visit the patient's son has disenrolled her from hospice. This increases our flexibility about what we can do to attempt to heal her pressure areas. Currently we are treating a left heel wound, right lateral leg wound and a lower sacral wound. The area on her sacrum and heel are pressure ulcers while the area on the right lateral leg was a pressure injury from an orthopedic brace. 03/13/17; using prisma to all wounds left heel,right lateral calf with tendon exposure. We have TheraSkin approved for hext week Electronic Signature(s) Signed: 03/13/2017 4:33:23 PM By: Linton Ham MD Entered By: Linton Ham on 03/13/2017 14:34:07 Buhl, [name] ([id number]) -------------------------------------------------------------------------------- Physical Exam Details Patient Name: Lumbra, Sherrel B. Date of Service: 03/13/2017 1:30 PM Medical Record Patient Account Number: [id number] Number: Treating RN: Montey Hora 31-Mar-1941 (75 y.o. Other Clinician: Date of Birth/Sex: Female) Treating ROBSON, MICHAEL Primary Care Provider:  Frazier Richards Provider/Extender: G Referring Provider: Jenelle Mages in Treatment: 6 Constitutional Patient is hypertensive.. Pulse regular and within target range for patient.Marland Kitchen Respirations regular, non-labored  and within target range.. Temperature is normal and within the target range for the patient.. Patient's appearance is neat and clean. Appears in no acute distress. Well nourished and well developed.. Cardiovascular Pedal pulses absent bilaterally.Marland Kitchen Psychiatric no major change. Notes Wound Exam. -left heel aggressively debrided with a #5 curette. Removed necrotic muscle and subcutaneous tissue Hemostasis with direct pressure. Tolerated well. thin layer of muscle over bone -right lateral calf healthier granulation. Still exposed tendon but improved -lower sacrum much better Electronic Signature(s) Signed: 03/13/2017 4:33:23 PM By: Linton Ham MD Entered By: Linton Ham on 03/13/2017 14:37:12 Colucci, [name] ([id number]) -------------------------------------------------------------------------------- Physician Orders Details Patient Name: [name], [name] Date of Service: 03/13/2017 1:30 PM Medical Record Patient Account Number: [id number] Number: Treating RN: Montey Hora [birth year] (75 y.o. Other Clinician: Date of Birth/Sex: Female) Treating ROBSON, MICHAEL Primary Care Provider: Frazier Richards Provider/Extender: G Referring Provider: Jenelle Mages in Treatment: 6 Verbal / Phone Orders: No Diagnosis Coding Wound Cleansing Wound #5 Sacrum o May shower with protection. - may cleanse all wounds in the shower except wound with tendon showing. Please protect that wound in the shower Wound #6 Left Calcaneus o May shower with protection. - may cleanse all wounds in the shower except wound with tendon showing. Please protect that wound in the shower Wound #7 Right,Proximal,Lateral Lower Leg o May shower with protection.  - may cleanse all wounds in the shower except wound with tendon showing. Please protect that wound in the shower Wound #8 Right,Distal,Lateral Lower Leg o May shower with protection. - may cleanse all wounds in the shower except wound with tendon showing. Please protect that wound in the shower Anesthetic Wound #5 Sacrum o Topical Lidocaine 4% cream applied to wound bed prior to debridement Wound #6 Left Calcaneus o Topical Lidocaine 4% cream applied to wound bed prior to debridement Wound #7 Right,Proximal,Lateral Lower Leg o Topical Lidocaine 4% cream applied to wound bed prior to debridement Wound #8 Right,Distal,Lateral Lower Leg o Topical Lidocaine 4% cream applied to wound bed prior to debridement Primary Wound Dressing Wound #5 Sacrum o Prisma Ag Wound #6 Left Calcaneus Cragun, Sheenah B. ([id number]) o Santyl Ointment Wound #7 Right,Proximal,Lateral Lower Leg o Prisma Ag - hydrogel to protect tendon Wound #8 Right,Distal,Lateral Lower Leg o Prisma Ag Secondary Dressing Wound #5 Sacrum o Dry Gauze o Boardered Foam Dressing Wound #6 Left Calcaneus o ABD and Kerlix/Conform Wound #7 Right,Proximal,Lateral Lower Leg o ABD and Kerlix/Conform Wound #8 Right,Distal,Lateral Lower Leg o ABD and Kerlix/Conform Dressing Change Frequency Wound #5 Sacrum o Change dressing every day. Wound #6 Left Calcaneus o Change dressing every other day. Wound #7 Right,Proximal,Lateral Lower Leg o Change dressing every day. Wound #8 Right,Distal,Lateral Lower Leg o Change dressing every day. Follow-up Appointments Wound #5 Sacrum o Return Appointment in 2 weeks. Wound #6 Left Calcaneus o Return Appointment in 2 weeks. Wound #7 Right,Proximal,Lateral Lower Leg o Return Appointment in 2 weeks. Wound #8 Right,Distal,Lateral Lower Leg o Return Appointment in 2 weeks. [name], [name] ([id number]) Edema Control Wound #7 Right,Proximal,Lateral Lower  Leg o Kerlix and Coban - Right Lower Extremity - wrap from toes to 3cm below the knee Wound #8 Right,Distal,Lateral Lower Leg o Kerlix and Coban - Right Lower Extremity - wrap from toes to 3cm below the knee Additional Orders / Instructions Wound #5 Sacrum o Increase protein intake. Wound #6 Left Calcaneus o Increase protein intake. Wound #7 Right,Proximal,Lateral Lower Leg o Increase protein intake. Wound #8 Right,Distal,Lateral Lower Leg o Increase  protein intake. Electronic Signature(s) Signed: 03/13/2017 4:33:23 PM By: Linton Ham MD Signed: 03/13/2017 5:41:54 PM By: Montey Hora Entered By: Montey Hora on 03/13/2017 14:12:25 Leaf, [name] ([id number]) -------------------------------------------------------------------------------- Problem List Details Patient Name: Dygert, Adabelle B. Date of Service: 03/13/2017 1:30 PM Medical Record Patient Account Number: [id number] Number: Treating RN: Montey Hora [birth year] (75 y.o. 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 Date of Service: 03/13/2017 1:30 PM Medical Record Patient Account Number: [id number] Number: Treating RN: Montey Hora February 26, 1941 (75 y.o. Other Clinician: Date of Birth/Sex: Female) Treating ROBSON, MICHAEL Primary Care Provider: Frazier Richards Provider/Extender: G Referring Provider: Jenelle Mages in Treatment: 6 Subjective Chief Complaint Information obtained from Patient 01/05/17; this is a 76 year old disabled woman from [facility] who is here for review of multiple wounds. She is accompanied by her son History of Present Illness (HPI) 12/26/16; this is a 76 year old woman who is from [facility] skilled facility. She is accompanied by her son who provides most of the history. He tells me he was called in early December to report a fall in the shower room at the facility. An x-ray showed a right supracondylar distal femur fracture. She was put in a brace and treated nonoperatively. She was also admitted to hospital in mid December and discharged on 12/18. During this admission she had sepsis secondary to an acute UTI. In reviewing [facility] she is also quoted during that admission as having a left ischial tuberosity wound coccyx wound and a deep tissue injury over the left heel. Her son states that sometime after this and when she returned to see her orthopedic surgeon she developed wound areas under the brace on the right lateral leg have both distally and a smaller wound superiorly on the lateral right leg. I am not completely certain what the facility is placing on these wounds. The patient has a history of atrial fibrillation. Her son tells me that years ago she had a fairly significant stroke but maintained a good quality of life up until 2 years ago when she developed dementia then things have really been going downhill since. She is been at a nursing facility for the last 18 months [[facility]] barely her nutritional intake is marginal but improving. Her son states that he is there and a lot of meals and she eats 75%. She is receiving nutritional  supplements as well as protein supplements she has a level II pressure-relief surface and bunny boots bilaterally on both feet 01/02/17; patient returns this week. Not much change. I crosshatched the left heel last week they have been applying Santyl. The extensive areas on the right lateral leg especially distally with a large area of exposed tendon we have been applying silver collagen. To her coccyx wound also silver collagen. According to her son who visits her daily and participates in her care her intake is fairly good she drinks well takes her nutritional supplements. She is at [facility] skilled  facility. 01/30/17; this is a patient I had seen 2 times last month. She was admitted to hospital from 2/25 through 2/28 with urosepsis and a seizure. She is at [facility]. The son has hospice services there but he uses them more just to add care to his mother rather than for any end-of-life mentality. We therefore of maintained a fairly aggressive posture to order wounds. This is in contradistinction to the last part of her discharge summary in February in which the discharging doctor as a note in bold font expressing that this [name], [name] ([id number]) patient is under hospice care and should be treated as such. I've never found that to be what her son is actually saying in any case she has an unstageable eschar over the left heel, a large wound on the right calf with extensive exposed tendon and surrounding granulation, a smaller wound on the upper right calf which appears improved and a stage III sacral pressure ulcer which is also improved since the last time I saw her 02/13/17; I spoke to hospice and palliative care of [facility] last week and talked about my perception wound care that'll be necessary for Mrs. this is based on relatively extensive discussions have had with her son who has a strong desire to get these wounds the best chance to heal. We have been using silver  collagen on the sacral wound, right lateral lateral leg in 2 spots and Santyl to the left heel. Her son says she is eating better and they are being religious about turning her at the facility with constant supervision of family 02/27/17; since our last visit the patient's son has disenrolled her from hospice. This increases our flexibility about what we can do to attempt to heal her pressure areas. Currently we are treating a left heel wound, right lateral leg wound and a lower sacral wound. The area on her sacrum and heel are pressure ulcers while the area on the right lateral leg was a pressure injury from an orthopedic brace. 03/13/17; using prisma to all wounds left heel,right lateral calf with tendon exposure. We have TheraSkin approved for hext week Objective Constitutional Patient is hypertensive.. Pulse regular and within target range for patient.Marland Kitchen Respirations regular, non-labored and within target range.. Temperature is normal and within the target range for the patient.. Patient's appearance is neat and clean. Appears in no acute distress. Well nourished and well developed.. Vitals Time Taken: 1:38 PM, Temperature: 97.4 F, Pulse: 71 bpm, Respiratory Rate: 18 breaths/min, Blood Pressure: 156/90 mmHg. Cardiovascular Pedal pulses absent bilaterally.Marland Kitchen Psychiatric no major change. General Notes: Wound Exam. -left heel aggressively debrided with a #5 curette. Removed necrotic muscle and subcutaneous tissue Hemostasis with direct pressure. Tolerated well. thin layer of muscle over bone -right lateral calf healthier granulation. Still exposed tendon but improved -lower sacrum much better Integumentary (Hair, Skin) Wound #5 status is Not Healed. Original cause of wound was Pressure Injury. The wound is located on the Sacrum. The wound measures 0.9cm length x 1.9cm width x 0.1cm depth; 1.343cm^2 area and 0.134cm^3 [name], [name] ([id number]) volume. The wound is limited to skin  breakdown. There is no tunneling or undermining noted. There is a large amount of serous drainage noted. The wound margin is flat and intact. There is large (67-100%) pink granulation within the wound bed. There is no necrotic tissue within the wound bed. The periwound skin appearance did not exhibit: Callus, Crepitus, Excoriation, Induration, Rash, Scarring, Dry/Scaly, Maceration, Atrophie Blanche, Cyanosis, Ecchymosis, Hemosiderin Staining,  Mottled, Pallor, Rubor, Erythema. The periwound has tenderness on palpation. Wound #6 status is Not Healed. Original cause of wound was Pressure Injury. The wound is located on the Left Calcaneus. The wound measures 3cm length x 4.4cm width x 0.5cm depth; 10.367cm^2 area and 5.184cm^3 volume. The wound is limited to skin breakdown. There is a large amount of serous drainage noted. The wound margin is flat and intact. There is no granulation within the wound bed. There is a large (67-100%) amount of necrotic tissue within the wound bed including Eschar. The periwound skin appearance did not exhibit: Callus, Crepitus, Excoriation, Induration, Rash, Scarring, Dry/Scaly, Maceration, Atrophie Blanche, Cyanosis, Ecchymosis, Hemosiderin Staining, Mottled, Pallor, Rubor, Erythema. The periwound has tenderness on palpation. Wound #7 status is Not Healed. Original cause of wound was Pressure Injury. The wound is located on the Right,Proximal,Lateral Lower Leg. The wound measures 0.4cm length x 0.5cm width x 0.1cm depth; 0.157cm^2 area and 0.016cm^3 volume. The wound is limited to skin breakdown. There is a large amount of serous drainage noted. The wound margin is flat and intact. There is large (67-100%) pink granulation within the wound bed. There is a small (1-33%) amount of necrotic tissue within the wound bed including Adherent Slough. The periwound skin appearance did not exhibit: Callus, Crepitus, Excoriation, Induration, Rash, Scarring, Dry/Scaly, Maceration,  Atrophie Blanche, Cyanosis, Ecchymosis, Hemosiderin Staining, Mottled, Pallor, Rubor, Erythema. Periwound temperature was noted as No Abnormality. The periwound has tenderness on palpation. Wound #8 status is Not Healed. Original cause of wound was Pressure Injury. The wound is located on the Right,Distal,Lateral Lower Leg. The wound measures 7cm length x 1.8cm width x 0.4cm depth; 9.896cm^2 area and 3.958cm^3 volume. There is tendon exposed. There is a large amount of serous drainage noted. The wound margin is flat and intact. There is medium (34-66%) pink granulation within the wound bed. There is a small (1-33%) amount of necrotic tissue within the wound bed including Eschar. The periwound skin appearance did not exhibit: Callus, Crepitus, Excoriation, Induration, Rash, Scarring, Dry/Scaly, Maceration, Atrophie Blanche, Cyanosis, Ecchymosis, Hemosiderin Staining, Mottled, Pallor, Rubor, Erythema. Periwound temperature was noted as No Abnormality. The periwound has tenderness on palpation. 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 ([id number]) Procedures Wound #6 Wound #6 is a Pressure Ulcer located on the Left Calcaneus . There was a Skin/Subcutaneous Tissue/Muscle Debridement (32440-10272) debridement with total area of 13.2 sq cm performed by Ricard Dillon, MD. with the following instrument(s): Curette to remove Viable and Non-Viable tissue/material including Fibrin/Slough, Muscle, Eschar, and Subcutaneous after achieving pain control using Lidocaine 4% Topical Solution. A time out was conducted at 14:07, prior to the start of the procedure. A Minimum amount of bleeding was controlled with Pressure. The procedure was tolerated well with a pain level of 0 throughout and a pain level of 0 following the  procedure. Post Debridement Measurements: 3cm length x 4.4cm width x 0.6cm depth; 6.22cm^3 volume. Post debridement Stage noted as Unstageable/Unclassified. Character of Wound/Ulcer Post Debridement is improved. Severity of Tissue Post Debridement is: Necrosis of muscle. Post procedure Diagnosis Wound #6: Same as Pre-Procedure Plan Wound Cleansing: Wound #5 Sacrum: May shower with protection. - may cleanse all wounds in the shower except wound with tendon showing. Please protect that wound in the shower Wound #6 Left Calcaneus: May shower with protection. - may cleanse all  wounds in the shower except wound with tendon showing. Please protect that wound in the shower Wound #7 Right,Proximal,Lateral Lower Leg: May shower with protection. - may cleanse all wounds in the shower except wound with tendon showing. Please protect that wound in the shower Wound #8 Right,Distal,Lateral Lower Leg: May shower with protection. - may cleanse all wounds in the shower except wound with tendon showing. Please protect that wound in the shower Anesthetic: Wound #5 Sacrum: Topical Lidocaine 4% cream applied to wound bed prior to debridement Wound #6 Left Calcaneus: Topical Lidocaine 4% cream applied to wound bed prior to debridement Wound #7 Right,Proximal,Lateral Lower Leg: Topical Lidocaine 4% cream applied to wound bed prior to debridement Wound #8 Right,Distal,Lateral Lower Leg: Topical Lidocaine 4% cream applied to wound bed prior to debridement Primary Wound Dressing: Wound #5 Sacrum: Bogart, Myrtha B. ([id number]) Prisma Ag Wound #6 Left Calcaneus: Santyl Ointment Wound #7 Right,Proximal,Lateral Lower Leg: Prisma Ag - hydrogel to protect tendon Wound #8 Right,Distal,Lateral Lower Leg: Prisma Ag Secondary Dressing: Wound #5 Sacrum: Dry Gauze Boardered Foam Dressing Wound #6 Left Calcaneus: ABD and Kerlix/Conform Wound #7 Right,Proximal,Lateral Lower Leg: ABD and Kerlix/Conform Wound #8  Right,Distal,Lateral Lower Leg: ABD and Kerlix/Conform Dressing Change Frequency: Wound #5 Sacrum: Change dressing every day. Wound #6 Left Calcaneus: Change dressing every other day. Wound #7 Right,Proximal,Lateral Lower Leg: Change dressing every day. Wound #8 Right,Distal,Lateral Lower Leg: Change dressing every day. Follow-up Appointments: Wound #5 Sacrum: Return Appointment in 2 weeks. Wound #6 Left Calcaneus: Return Appointment in 2 weeks. Wound #7 Right,Proximal,Lateral Lower Leg: Return Appointment in 2 weeks. Wound #8 Right,Distal,Lateral Lower Leg: Return Appointment in 2 weeks. Edema Control: Wound #7 Right,Proximal,Lateral Lower Leg: Kerlix and Coban - Right Lower Extremity - wrap from toes to 3cm below the knee Wound #8 Right,Distal,Lateral Lower Leg: Kerlix and Coban - Right Lower Extremity - wrap from toes to 3cm below the knee Additional Orders / Instructions: Wound #5 Sacrum: Increase protein intake. Wound #6 Left Calcaneus: Increase protein intake. Wound #7 Right,Proximal,Lateral Lower Leg: Increase protein intake. Wound #8 Right,Distal,Lateral Lower Leg: Increase protein intake. Cabiness, Bryna B. ([id number]) 1 prisma right calf and sacrum 2 santyl to left heel change daily. I would like to have enough theraskin next week to cover this 3 theraskin hopefull to close over the tendon on the right calf next week Electronic Signature(s) Signed: 03/13/2017 4:33:23 PM By: Linton Ham MD Entered By: Linton Ham on 03/13/2017 14:39:04 Thackston, [name] ([id number]) -------------------------------------------------------------------------------- [facility] Details Patient Name: Roca, Nkenge B. Date of Service: 03/13/2017 Medical Record Patient Account Number: [id number] Number: Treating RN: Montey Hora June 23, 1941 (75 y.o. 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

## 2017-03-20 ENCOUNTER — Encounter: Payer: Medicare Other | Attending: Internal Medicine | Admitting: Internal Medicine

## 2017-03-20 DIAGNOSIS — K219 Gastro-esophageal reflux disease without esophagitis: Secondary | ICD-10-CM | POA: Diagnosis not present

## 2017-03-20 DIAGNOSIS — Z8673 Personal history of transient ischemic attack (TIA), and cerebral infarction without residual deficits: Secondary | ICD-10-CM | POA: Insufficient documentation

## 2017-03-20 DIAGNOSIS — L97213 Non-pressure chronic ulcer of right calf with necrosis of muscle: Secondary | ICD-10-CM | POA: Insufficient documentation

## 2017-03-20 DIAGNOSIS — M109 Gout, unspecified: Secondary | ICD-10-CM | POA: Insufficient documentation

## 2017-03-20 DIAGNOSIS — L89153 Pressure ulcer of sacral region, stage 3: Secondary | ICD-10-CM | POA: Diagnosis not present

## 2017-03-20 DIAGNOSIS — L89620 Pressure ulcer of left heel, unstageable: Secondary | ICD-10-CM | POA: Diagnosis not present

## 2017-03-20 DIAGNOSIS — F0390 Unspecified dementia without behavioral disturbance: Secondary | ICD-10-CM | POA: Insufficient documentation

## 2017-03-20 DIAGNOSIS — G40909 Epilepsy, unspecified, not intractable, without status epilepticus: Secondary | ICD-10-CM | POA: Insufficient documentation

## 2017-03-20 DIAGNOSIS — I4891 Unspecified atrial fibrillation: Secondary | ICD-10-CM | POA: Insufficient documentation

## 2017-03-22 NOTE — Progress Notes (Addendum)
[name], [name] ([id number]) Visit Report for 03/20/2017 Arrival Information Details Patient Name: [name], [name] Date of Service: 03/20/2017 12:30 PM Medical Record Patient Account Number: [id number] Number: Treating RN: Cornell Barman 1940/11/30 (75 y.o. Other Clinician: Date of Birth/Sex: Female) Treating ROBSON, MICHAEL Primary Care Roshanna Cimino: Frazier Richards Dwanna Goshert/Extender: G Referring Grethel Zenk: Jenelle Mages in Treatment: 7 Visit Information History Since Last Visit Added or deleted any medications: No Patient Arrived: Wheel Chair Any new allergies or adverse No reactions: Arrival Time: 12:44 Had a fall or experienced change in No Accompanied By: son activities of daily living that may Transfer Assistance: [employer] fast streamer affect Patient Identification Verified: Yes risk of falls: Secondary Verification Process Yes Signs or symptoms of abuse/neglect No Completed: since last visito Hospitalized since last visit: No Has Dressing in Place as Yes Prescribed: Pain Present Now: Unable to [employer]) Signed: 03/21/2017 8:43:34 AM By: Gretta Cool, RN, BSN, Kim RN, BSN Entered By: Gretta Cool, RN, BSN, Kim on 03/20/2017 12:45:02 [name] ([id number]) -------------------------------------------------------------------------------- Encounter Discharge Information Details Patient Name: [name], [name] Date of Service: 03/20/2017 12:30 PM Medical Record Patient Account Number: [id number] Number: Treating RN: Cornell Barman 05/11/1941 (75 y.o. Other Clinician: Date of Birth/Sex: Female) Treating ROBSON, MICHAEL Primary Care Arnoldo Hildreth: Frazier Richards Maybel Dambrosio/Extender: G Referring Tanis Burnley: Jenelle Mages in Treatment: 7 Encounter Discharge Information Items Discharge Pain Level: 0 Discharge Condition: Stable Ambulatory Status: Wheelchair Discharge Destination: Nursing Home Transportation: Private Auto Accompanied By:  son Schedule Follow-up Appointment: Yes Medication Reconciliation completed Yes and provided to Patient/Care Zelta Enfield: Provided on Clinical Summary of Care: 03/20/2017 Form Type Recipient Paper Patient EM Electronic Signature(s) Signed: 03/21/2017 8:43:34 AM By: Gretta Cool RN, BSN, Kim RN, BSN Previous Signature: 03/20/2017 1:55:00 PM Version By: Ruthine Dose Entered By: Gretta Cool RN, BSN, Kim on 03/20/2017 13:55:45 [name] ([id number]) -------------------------------------------------------------------------------- Lower Extremity Assessment Details Patient Name: [name], [name] Date of Service: 03/20/2017 12:30 PM Medical Record Patient Account Number: [id number] Number: Treating RN: Cornell Barman [birth year] (75 y.o. Other Clinician: Date of Birth/Sex: Female) Treating ROBSON, MICHAEL Primary Care Chalisa Kobler: Frazier Richards Mirabella Hilario/Extender: G Referring Cherise Fedder: Jenelle Mages in Treatment: 7 Vascular Assessment Pulses: Dorsalis Pedis Palpable: [Left:Yes] [Right:Yes] Posterior Tibial Extremity colors, hair growth, and conditions: Temperature of Extremity: [Left:Cool] [Right:Cool] Toe Nail Assessment Left: Right: Thick: No No Discolored: No No Deformed: No No Improper Length and Hygiene: No No Electronic Signature(s) Signed: 03/21/2017 8:43:34 AM By: Gretta Cool, RN, BSN, Kim RN, BSN Entered By: Gretta Cool, RN, BSN, Kim on 03/20/2017 13:11:53 [name] ([id number]) -------------------------------------------------------------------------------- Multi Wound Chart Details Patient Name: [name], [name] Date of Service: 03/20/2017 12:30 PM Medical Record Patient Account Number: [id number] Number: Treating RN: Cornell Barman 03-18-1941 (75 y.o. Other Clinician: Date of Birth/Sex: Female) Treating ROBSON, MICHAEL Primary Care Shya Kovatch: Frazier Richards Rehana Uncapher/Extender: G Referring Geffrey Michaelsen: Jenelle Mages in Treatment: 7 Vital  Signs Height(in): Pulse(bpm): 87 Weight(lbs): Blood Pressure 89/61 (mmHg): Body Mass Index(BMI): Temperature(F): Respiratory Rate 16 (breaths/min): Photos: [5:No Photos] [6:No Photos] [7:No Photos] Wound Location: [5:Sacrum] [6:Left Calcaneus] [7:Right, Proximal, Lateral Lower Leg] Wounding Event: [5:Pressure Injury] [6:Pressure Injury] [7:Pressure Injury] Primary Etiology: [5:Pressure Ulcer] [6:Pressure Ulcer] [7:Pressure Ulcer] Comorbid History: [5:N/A] [6:Cataracts, Arrhythmia, History of pressure wounds, Gout, Seizure Disorder] [7:N/A] Date Acquired: [5:10/22/2016] [6:10/22/2016] [7:10/22/2016] Weeks of Treatment: [5:7] [6:7] [7:7] Wound Status: [5:Open] [6:Open] [7:Healed - Epithelialized] Measurements L x W x D 0.6x1.8x0.1 [6:2.1x3.5x0.1] [7:0x0x0] (cm) Area (cm) : [5:0.848] [6:5.773] [7:0] Volume (cm) : [5:0.085] [6:0.577] [7:0] % Reduction in  Area: [5:84.00%] [6:56.80%] [7:100.00%] % Reduction in Volume: 84.00% [6:78.40%] [7:100.00%] Starting Position 1 [6:9] (o'clock): Ending Position 1 [6:1] (o'clock): Maximum Distance 1 [6:0.5] (cm): Undermining: [5:N/A] [6:Yes] [7:N/A] Classification: [5:Category/Stage II] [6:Unstageable/Unclassified] [7:Category/Stage II] Exudate Amount: [5:N/A] [6:Large] [7:N/A] Exudate Type: [5:N/A] [6:Serous] [7:N/A] Exudate Color: N/A amber N/A Wound Margin: N/A Epibole N/A Granulation Amount: N/A None Present (0%) N/A Granulation Quality: N/A N/A N/A Necrotic Amount: N/A Large (67-100%) N/A Necrotic Tissue: N/A Eschar N/A Epithelialization: N/A Small (1-33%) N/A Periwound Skin Texture: No Abnormalities Noted Excoriation: No No Abnormalities Noted Induration: No Callus: No Crepitus: No Rash: No Scarring: No Periwound Skin No Abnormalities Noted Maceration: Yes No Abnormalities Noted Moisture: Dry/Scaly: No Periwound Skin Color: No Abnormalities Noted Ecchymosis: Yes No Abnormalities Noted Atrophie Blanche: No Cyanosis:  No Erythema: No Hemosiderin Staining: No Mottled: No Pallor: No Rubor: No Temperature: N/A N/A N/A Tenderness on No Yes No Palpation: Wound Preparation: N/A Ulcer Cleansing: N/A Rinsed/Irrigated with Saline Topical Anesthetic Applied: Other: lidocaine 4% Procedures Performed: N/A N/A N/A Wound Number: 8 N/A N/A Photos: No Photos N/A N/A Wound Location: Right Lower Leg - Lateral, N/A N/A Distal Wounding Event: Pressure Injury N/A N/A Primary Etiology: Pressure Ulcer N/A N/A Comorbid History: Cataracts, Arrhythmia, N/A N/A History of pressure wounds, Gout, Seizure Disorder Date Acquired: 10/22/2016 N/A N/A Weeks of Treatment: 7 N/A N/A Wound Status: Open N/A N/A Measurements L x W x D 6.8x1.8x0.3 N/A N/A (cm) Agrawal, Lenzie B. ([id number]) Area (cm) : 9.613 N/A N/A Volume (cm) : 2.884 N/A N/A % Reduction in Area: 70.30% N/A N/A % Reduction in Volume: 77.70% N/A N/A Starting Position 1 12 (o'clock): Ending Position 1 1 (o'clock): Maximum Distance 1 0.7 (cm): Undermining: Yes N/A N/A Classification: Category/Stage IV N/A N/A Exudate Amount: Large N/A N/A Exudate Type: Serous N/A N/A Exudate Color: amber N/A N/A Wound Margin: Epibole N/A N/A Granulation Amount: Medium (34-66%) N/A N/A Granulation Quality: Pink N/A N/A Necrotic Amount: None Present (0%) N/A N/A Necrotic Tissue: N/A N/A N/A Exposed Structures: Tendon: Yes N/A N/A Fascia: No Fat Layer (Subcutaneous Tissue) Exposed: No Muscle: No Joint: No Bone: No Epithelialization: None N/A N/A Periwound Skin Texture: Excoriation: No N/A N/A Induration: No Callus: No Crepitus: No Rash: No Scarring: No Periwound Skin Maceration: No N/A N/A Moisture: Dry/Scaly: No Periwound Skin Color: Ecchymosis: Yes N/A N/A Hemosiderin Staining: Yes Atrophie Blanche: No Cyanosis: No Erythema: No Mottled: No Pallor: No Rubor: No Temperature: No Abnormality N/A N/A Tenderness on Yes N/A N/A Palpation: Wound  Preparation: Ulcer Cleansing: N/A N/A Rinsed/Irrigated with Saline Gelb, [name] ([id number]) Topical Anesthetic Applied: Other: lidocaine 4% Procedures Performed: Cellular or Tissue Based N/A N/A Product Treatment Notes Wound #5 (Sacrum) 1. Cleansed with: Clean wound with Normal Saline 2. Anesthetic Topical Lidocaine 4% cream to wound bed prior to debridement 4. Dressing Applied: Prisma Ag 5. Secondary Dressing Applied Bordered Foam Dressing Wound #6 (Left Calcaneus) 1. Cleansed with: Clean wound with Normal Saline 2. Anesthetic Topical Lidocaine 4% cream to wound bed prior to debridement 5. Secondary Dressing Applied Gauze and Kerlix/Conform Notes coban to secure Wound #8 (Right, Distal, Lateral Lower Leg) 1. Cleansed with: Clean wound with Normal Saline 2. Anesthetic Topical Lidocaine 4% cream to wound bed prior to debridement 5. Secondary Dressing Applied Gauze and Kerlix/Conform Notes coban to secure Electronic Signature(s) Signed: 03/20/2017 5:52:53 PM By: Linton Ham MD Entered By: Linton Ham on 03/20/2017 14:38:09 Gram, Raul [name] ([id number]) -------------------------------------------------------------------------------- [place] Details Patient Name: Bowlby, [name] Date of  Service: 03/20/2017 12:30 PM Medical Record Patient Account Number: [id number] Number: Treating RN: Cornell Barman 04-11-1941 (75 y.o. Other Clinician: Date of Birth/Sex: Female) Treating ROBSON, MICHAEL Primary Care Deandrew Hoecker: Frazier Richards Tyonna Talerico/Extender: G Referring Vincent Streater: Jenelle Mages in Treatment: 7 Active Inactive Electronic Signature(s) Signed: 03/27/2017 12:00:55 PM By: Gretta Cool RN, BSN, Kim RN, BSN Previous Signature: 03/21/2017 8:43:34 AM Version By: Gretta Cool RN, BSN, Kim RN, BSN Entered By: Gretta Cool, RN, BSN, Kim on 03/27/2017 12:00:53 Callander, Raul [name]  ([id number]) -------------------------------------------------------------------------------- Pain Assessment Details Patient Name: Mansfield, [name] Date of Service: 03/20/2017 12:30 PM Medical Record Patient Account Number: [id number] Number: Treating RN: Cornell Barman [birth year] (75 y.o. Other Clinician: Date of Birth/Sex: Female) Treating ROBSON, MICHAEL Primary Care Jarid Sasso: Frazier Richards Kennedee Kitzmiller/Extender: G Referring Trinidy Masterson: Jenelle Mages in Treatment: 7 Active Problems Location of Pain Severity and Description of Pain Patient Has Paino Patient Unable to Respond Site Locations Pain Management and Medication Current Pain Management: Electronic Signature(s) Signed: 03/21/2017 8:43:34 AM By: Gretta Cool, RN, BSN, Kim RN, BSN Entered By: Gretta Cool, RN, BSN, Kim on 03/20/2017 12:45:08 Coplen, Raul [name] ([id number]) -------------------------------------------------------------------------------- Patient/Caregiver Education Details Patient Name: Weirauch, Journii B. Date of Service: 03/20/2017 12:30 PM Medical Record Patient Account Number: [id number] Number: Treating RN: Cornell Barman November 20, 1940 (75 y.o. Other Clinician: Date of Birth/Gender: Female) Treating ROBSON, [place] Primary Care Physician: Frazier Richards Physician/Extender: G Referring Physician: Jenelle Mages in Treatment: 7 Education Assessment Education Provided To: Patient Education Topics Provided Wound/Skin Impairment: Handouts: Caring for Your Ulcer, Other: Do not remove dressing under steri-strips Methods: Demonstration, Explain/Verbal Responses: State content correctly Electronic Signature(s) Signed: 03/21/2017 8:43:34 AM By: Gretta Cool, RN, BSN, Kim RN, BSN Entered By: Gretta Cool, RN, BSN, Kim on 03/20/2017 15:12:15 Sheppard, Raul [name] ([id number]) -------------------------------------------------------------------------------- Wound Assessment Details Patient Name: Withington, [name] Date of  Service: 03/20/2017 12:30 PM Medical Record Patient Account Number: [id number] Number: Treating RN: Cornell Barman [birth year] (75 y.o. Other Clinician: Date of Birth/Sex: Female) Treating ROBSON, MICHAEL Primary Care Tailor Westfall: Frazier Richards Albert Hersch/Extender: G Referring Legaci Tarman: Jenelle Mages in Treatment: 7 Wound Status Wound Number: 5 Primary Pressure Ulcer Etiology: Wound Location: Sacrum Wound Open Wounding Event: Pressure Injury Status: Date Acquired: 10/22/2016 Comorbid Cataracts, Arrhythmia, History of Weeks Of Treatment: 7 History: pressure wounds, Gout, Seizure Clustered Wound: No Disorder Photos Photo Uploaded By: Gretta Cool, RN, BSN, Kim on 03/20/2017 14:59:26 Wound Measurements Length: (cm) 0.6 % Reduction in Ar Width: (cm) 1.8 % Reduction in Vo Depth: (cm) 0.1 Epithelialization Area: (cm) 0.848 Tunneling: Volume: (cm) 0.085 Undermining: ea: 84% lume: 84% : None No No Wound Description Classification: Category/Stage II Foul Odor After Wound Margin: Flat and Intact Slough/Fibrino Exudate Amount: Large Exudate Type: Serous Exudate Color: amber Cleansing: No Yes Wound Bed Granulation Amount: Large (67-100%) Exposed Structure Granulation Quality: Pink Fascia Exposed: No Necrotic Amount: None Present (0%) Fat Layer (Subcutaneous Tissue) Exposed: No Tendon Exposed: No Bree, [name] ([id number]) Muscle Exposed: No Joint Exposed: No Bone Exposed: No Limited to Skin Breakdown Periwound Skin Texture Texture Color No Abnormalities Noted: No No Abnormalities Noted: No Callus: No Atrophie Blanche: No Crepitus: No Cyanosis: No Excoriation: No Ecchymosis: No Induration: No Erythema: No Rash: No Hemosiderin Staining: No Scarring: No Mottled: No Pallor: No Moisture Rubor: No No Abnormalities Noted: No Dry / Scaly: No Temperature / Pain Maceration: No Tenderness on Palpation: Yes Wound Preparation Ulcer Cleansing:  Rinsed/Irrigated with Saline Topical Anesthetic Applied: None Electronic Signature(s) Signed: 03/20/2017 2:58:25 PM By: Gretta Cool, RN, BSN, Kim RN, BSN  Entered By: Gretta Cool, RN, BSN, Kim on 03/20/2017 14:58:24 Taitt, Raul [name] ([id number]) -------------------------------------------------------------------------------- Wound Assessment Details Patient Name: Paddock, [name] Date of Service: 03/20/2017 12:30 PM Medical Record Patient Account Number: [id number] Number: Treating RN: Cornell Barman 04-30-1941 (75 y.o. Other Clinician: Date of Birth/Sex: Female) Treating ROBSON, [place] Primary Care Mathea Frieling: Frazier Richards Delmas Faucett/Extender: G Referring Kalise Fickett: Jenelle Mages in Treatment: 7 Wound Status Wound Number: 6 Primary Pressure Ulcer Etiology: Wound Location: Left Calcaneus Wound Open Wounding Event: Pressure Injury Status: Date Acquired: 10/22/2016 Comorbid Cataracts, Arrhythmia, History of Weeks Of Treatment: 7 History: pressure wounds, Gout, Seizure Clustered Wound: No Disorder Photos Photo Uploaded By: Gretta Cool, RN, BSN, Kim on 03/20/2017 14:59:26 Wound Measurements Length: (cm) 2.1 % Reduction in A Width: (cm) 3.5 % Reduction in V Depth: (cm) 0.1 Epithelializatio Area: (cm) 5.773 Undermining: Volume: (cm) 0.577 Starting Pos Ending Positi Maximum Dista rea: 56.8% olume: 78.4% n: Small (1-33%) Yes ition (o'clock): 9 on (o'clock): 1 nce: (cm) 0.5 Wound Description Classification: Unstageable/Unclassified Foul Odor After Wound Margin: Epibole Slough/Fibrino Exudate Amount: Large Exudate Type: Serous Exudate Color: amber Cleansing: No No Wound Bed Granulation Amount: None Present (0%) Exposed Structure Colarusso, [name] ([id number]) Necrotic Amount: Large (67-100%) Fascia Exposed: No Necrotic Quality: Eschar Fat Layer (Subcutaneous Tissue) Exposed: Yes Tendon Exposed: No Muscle Exposed: No Joint Exposed: No Bone Exposed: No Periwound Skin  Texture Texture Color No Abnormalities Noted: No No Abnormalities Noted: No Callus: No Atrophie Blanche: No Crepitus: No Cyanosis: No Excoriation: No Ecchymosis: Yes Induration: No Erythema: No Rash: No Hemosiderin Staining: No Scarring: No Mottled: No Pallor: No Moisture Rubor: No No Abnormalities Noted: No Dry / Scaly: No Temperature / Pain Maceration: Yes Tenderness on Palpation: Yes Wound Preparation Ulcer Cleansing: Rinsed/Irrigated with Saline Topical Anesthetic Applied: Other: lidocaine 4%, Electronic Signature(s) Signed: 03/21/2017 8:43:34 AM By: Gretta Cool, RN, BSN, Kim RN, BSN Entered By: Gretta Cool, RN, BSN, Kim on 03/20/2017 13:06:32 [place], Raul [name] ([id number]) -------------------------------------------------------------------------------- Wound Assessment Details Patient Name: Schulenburg, [name] Date of Service: 03/20/2017 12:30 PM Medical Record Patient Account Number: [id number] Number: Treating RN: Cornell Barman [birth year] (75 y.o. Other Clinician: Date of Birth/Sex: Female) Treating ROBSON, MICHAEL Primary Care Berneda Piccininni: Frazier Richards Nailah Luepke/Extender: G Referring Anas Reister: Jenelle Mages in Treatment: 7 Wound Status Wound Number: 7 Primary Etiology: Pressure Ulcer Wound Location: Right, Proximal, Lateral Lower Wound Status: Healed - Epithelialized Leg Wounding Event: Pressure Injury Date Acquired: 10/22/2016 Weeks Of Treatment: 7 Clustered Wound: No Photos Photo Uploaded By: Gretta Cool, RN, BSN, Kim on 03/20/2017 15:02:27 Wound Measurements Length: (cm) 0 Width: (cm) 0 Depth: (cm) 0 Area: (cm) 0 Volume: (cm) 0 % Reduction in Area: 100% % Reduction in Volume: 100% Wound Description Classification: Category/Stage II Periwound Skin Texture Texture Color No Abnormalities Noted: No No Abnormalities Noted: No Moisture No Abnormalities Noted: No Electronic Signature(s) Signed: 03/21/2017 8:43:34 AM By: Gretta Cool, RN, BSN, Kim RN,  BSN Jobin, Raul [name] ([id number]) Entered By: Gretta Cool, RN, BSN, Kim on 03/20/2017 13:51:32 [place], Raul [name] ([id number]) -------------------------------------------------------------------------------- Wound Assessment Details Patient Name: Gaby, [name] Date of Service: 03/20/2017 12:30 PM Medical Record Patient Account Number: [id number] Number: Treating RN: Cornell Barman 1941-05-21 (75 y.o. Other Clinician: Date of Birth/Sex: Female) Treating ROBSON, MICHAEL Primary Care Rianna Lukes: Frazier Richards Boaz Berisha/Extender: G Referring Zymir Napoli: Jenelle Mages in Treatment: 7 Wound Status Wound Number: 8 Primary Pressure Ulcer Etiology: Wound Location: Right, Distal, Lateral Lower Leg Wound Open Wounding Event: Pressure Injury Status: Date Acquired: 10/22/2016 Comorbid Cataracts, Arrhythmia, History of  Weeks Of Treatment: 7 History: pressure wounds, Gout, Seizure Clustered Wound: No Disorder Photos Photo Uploaded By: Gretta Cool, RN, BSN, Kim on 03/20/2017 15:02:27 Wound Measurements Length: (cm) 7 Width: (cm) 1.8 Depth: (cm) 0.3 Area: (cm) 9.896 Volume: (cm) 2.969 % Reduction in Area: 69.5% % Reduction in Volume: 77.1% Epithelialization: None Undermining: Yes Starting Position (o'clock): 12 Ending Position (o'clock): 1 Maximum Distance: (cm) 0.7 Wound Description Classification: Category/Stage IV Wound Margin: Epibole Exudate Amount: Large Exudate Type: Serous Exudate Color: amber Foul Odor After Cleansing: No Slough/Fibrino Yes Wound Bed Granulation Amount: Medium (34-66%) Exposed Structure Yorio, [name] ([id number]) Granulation Quality: Pink Fascia Exposed: No Necrotic Amount: None Present (0%) Fat Layer (Subcutaneous Tissue) Exposed: No Tendon Exposed: Yes Muscle Exposed: No Joint Exposed: No Bone Exposed: No Periwound Skin Texture Texture Color No Abnormalities Noted: No No Abnormalities Noted: No Callus: No Atrophie Blanche:  No Crepitus: No Cyanosis: No Excoriation: No Ecchymosis: Yes Induration: No Erythema: No Rash: No Hemosiderin Staining: Yes Scarring: No Mottled: No Pallor: No Moisture Rubor: No No Abnormalities Noted: No Dry / Scaly: No Temperature / Pain Maceration: No Temperature: No Abnormality Tenderness on Palpation: Yes Wound Preparation Ulcer Cleansing: Rinsed/Irrigated with Saline Topical Anesthetic Applied: Other: lidocaine 4%, Electronic Signature(s) Signed: 03/21/2017 8:43:34 AM By: Gretta Cool, RN, BSN, Kim RN, BSN Entered By: Gretta Cool, RN, BSN, Kim on 03/20/2017 15:07:36 Cornette, Raul [name] ([id number]) -------------------------------------------------------------------------------- [place] Details Patient Name: Golob, [name] Date of Service: 03/20/2017 12:30 PM Medical Record Patient Account Number: [id number] Number: Treating RN: Cornell Barman 13-Feb-1941 (75 y.o. Other Clinician: Date of Birth/Sex: Female) Treating ROBSON, MICHAEL Primary Care Marlana Mckowen: Frazier Richards Matt Delpizzo/Extender: G Referring Charity Tessier: Jenelle Mages in Treatment: 7 Vital Signs Time Taken: 12:45 Pulse (bpm): 87 Respiratory Rate (breaths/min): 16 Blood Pressure (mmHg): 89/61 Reference Range: 80 - 120 mg / dl Notes MD notified of BP. Electronic Signature(s) Signed: 03/20/2017 2:57:53 PM By: Gretta Cool, RN, BSN, Kim RN, BSN Entered By: Gretta Cool, RN, BSN, Kim on 03/20/2017 14:57:52

## 2017-03-22 NOTE — Progress Notes (Addendum)
ERICHA, WHITTINGHAM ([id number]) Visit Report for 03/20/2017 Chief Complaint Document Details Patient Name: [name] Date of Service: 03/20/2017 12:30 PM Medical Record Patient Account Number: [id number] Number: Treating RN: Cornell Barman 02/15/1941 (75 y.o. Other Clinician: Date of Birth/Sex: Female) Treating Tallulah Hosman Primary Care Provider: Frazier Richards Provider/Extender: G Referring Provider: Jenelle Mages in Treatment: 7 Information Obtained from: Patient Chief Complaint 01/05/17; this is a 76 year old disabled woman from [facility] who is here for review of multiple wounds. She is accompanied by her son Engineer, maintenance) Signed: 03/20/2017 5:52:53 PM By: Linton Ham MD Entered By: Linton Ham on 03/20/2017 14:38:55 Dorce, Raul Del ([id number]) -------------------------------------------------------------------------------- Cellular or Tissue Based Product Details Patient Name: [name] Date of Service: 03/20/2017 12:30 PM Medical Record Patient Account Number: [id number] Number: Treating RN: Cornell Barman [birth year] (75 y.o. Other Clinician: Date of Birth/Sex: Female) Treating Kayan Blissett Primary Care Provider: Frazier Richards Provider/Extender: G Referring Provider: Jenelle Mages in Treatment: 7 Cellular or Tissue Based Wound #8 Right,Distal,Lateral Lower Leg Product Type Applied to: Performed By: Physician Ricard Dillon, MD Cellular or Tissue Based Theraskin Product Type: Pre-procedure Yes - 13:24 Verification/Time Out Taken: Location: trunk / arms / legs Wound Size (sq cm): 12.6 Product Size (sq cm): 39 Waste Size (sq cm): 0 Amount of Product Applied (sq cm): 39 Lot #: [id number] Order #: [id number] Expiration Date: 11/29/2020 Reconstituted: Yes Solution Type: nacl Solution Amount: 49ml Lot #: [id number] Solution Expiration 11/19/2018 Date: Secured: Yes Secured With:  Steri-Strips Dressing Applied: Yes Primary Dressing: mepitel Procedural Pain: 0 Post Procedural Pain: 0 Response to Treatment: Procedure was tolerated well Post Procedure Diagnosis Same as Pre-procedure Electronic Signature(s) Signed: 03/20/2017 3:08:43 PM By: Gretta Cool, RN, BSN, Kim RN, BSN Myint, Raul Del ([id number]) Entered By: Gretta Cool, RN, BSN, Kim on 03/20/2017 15:08:42 Herard, Raul Del ([id number]) -------------------------------------------------------------------------------- Cellular or Tissue Based Product Details Patient Name: [name] Date of Service: 03/20/2017 12:30 PM Medical Record Patient Account Number: [id number] Number: Treating RN: Cornell Barman [birth year] (75 y.o. Other Clinician: Date of Birth/Sex: Female) Treating Momen Ham Primary Care Provider: Frazier Richards Provider/Extender: G Referring Provider: Jenelle Mages in Treatment: 7 Cellular or Tissue Based Wound #6 Left Calcaneus Product Type Applied to: Performed By: Physician Ricard Dillon, MD Cellular or Tissue Based Theraskin Product Type: Pre-procedure Yes - 13:24 Verification/Time Out Taken: Location: trunk / arms / legs Wound Size (sq cm): 7.35 Product Size (sq cm): 39 Waste Size (sq cm): 0 Amount of Product Applied (sq cm): 39 Lot #: [id number] Order #: [id number] Expiration Date: 11/29/2020 Reconstituted: Yes Solution Type: nacl Solution Amount: 37ml Lot #: [id number] Solution Expiration 11/19/2018 Date: Secured: Yes Secured With: Steri-Strips Dressing Applied: Yes Primary Dressing: mepitel Procedural Pain: 0 Post Procedural Pain: 0 Response to Treatment: Procedure was tolerated well Post Procedure Diagnosis Same as Pre-procedure Electronic Signature(s) Signed: 03/20/2017 3:11:11 PM By: Gretta Cool, RN, BSN, Kim RN, BSN Oh, Raul Del ([id number]) Entered By: Gretta Cool, RN, BSN, Kim on 03/20/2017 15:11:10 Gundrum, Raul Del  ([id number]) -------------------------------------------------------------------------------- HPI Details Patient Name: [name] Date of Service: 03/20/2017 12:30 PM Medical Record Patient Account Number: [id number] Number: Treating RN: Cornell Barman 1941/07/03 (75 y.o. Other Clinician: Date of Birth/Sex: Female) Treating Sieara Bremer Primary Care Provider: Frazier Richards Provider/Extender: G Referring Provider: Jenelle Mages in Treatment: 7 History of Present Illness HPI Description: 12/26/16; this is a 76 year old woman who is from [facility] skilled facility. She is accompanied by her son  who provides most of the history. He tells me he was called in early December to report a fall in the shower room at the facility. An x-ray showed a right supracondylar distal femur fracture. She was put in a brace and treated nonoperatively. She was also admitted to hospital in mid December and discharged on 12/18. During this admission she had sepsis secondary to an acute UTI. In reviewing [facility] she is also quoted during that admission as having a left ischial tuberosity wound coccyx wound and a deep tissue injury over the left heel. Her son states that sometime after this and when she returned to see her orthopedic surgeon she developed wound areas under the brace on the right lateral leg have both distally and a smaller wound superiorly on the lateral right leg. I am not completely certain what the facility is placing on these wounds. The patient has a history of atrial fibrillation. Her son tells me that years ago she had a fairly significant stroke but maintained a good quality of life up until 2 years ago when she developed dementia then things have really been going downhill since. She is been at a nursing facility for the last 18 months [[facility]] barely her nutritional intake is marginal but improving. Her son states that he is there and a  lot of meals and she eats 75%. She is receiving nutritional supplements as well as protein supplements she has a level II pressure-relief surface and bunny boots bilaterally on both feet 01/02/17; patient returns this week. Not much change. I crosshatched the left heel last week they have been applying Santyl. The extensive areas on the right lateral leg especially distally with a large area of exposed tendon we have been applying silver collagen. To her coccyx wound also silver collagen. According to her son who visits her daily and participates in her care her intake is fairly good she drinks well takes her nutritional supplements. She is at [facility] skilled facility. 01/30/17; this is a patient I had seen 2 times last month. She was admitted to hospital from 2/25 through 2/28 with urosepsis and a seizure. She is at [facility]. The son has hospice services there but he uses them more just to add care to his mother rather than for any end-of-life mentality. We therefore of maintained a fairly aggressive posture to order wounds. This is in contradistinction to the last part of her discharge summary in February in which the discharging doctor as a note in bold font expressing that this patient is under hospice care and should be treated as such. I've never found that to be what her son is actually saying in any case she has an unstageable eschar over the left heel, a large wound on the right calf with extensive exposed tendon and surrounding granulation, a smaller wound on the upper right calf which appears improved and a stage III sacral pressure ulcer which is also improved since the last time I saw her Flaim, Mauria B. ([id number]) 02/13/17; I spoke to hospice and palliative care of [facility] last week and talked about my perception wound care that'll be necessary for Mrs. this is based on relatively extensive discussions have had with her son who has a strong desire to get these  wounds the best chance to heal. We have been using silver collagen on the sacral wound, right lateral lateral leg in 2 spots and Santyl to the left heel. Her son says she is eating better and  they are being religious about turning her at the facility with constant supervision of family 02/27/17; since our last visit the patient's son has disenrolled her from hospice. This increases our flexibility about what we can do to attempt to heal her pressure areas. Currently we are treating a left heel wound, right lateral leg wound and a lower sacral wound. The area on her sacrum and heel are pressure ulcers while the area on the right lateral leg was a pressure injury from an orthopedic brace. 03/13/17; using prisma to all wounds left heel,right lateral calf with tendon exposure. We have TheraSkin approved for hext week 03/20/17; two wounds on the left heel and right lateral calf. with tendon exposure. TheraSkin #1. Small wound on the lower sacrum. Electronic Signature(s) Signed: 03/20/2017 5:52:53 PM By: Linton Ham MD Entered By: Linton Ham on 03/20/2017 14:40:06 Burrous, Raul Del ([id number]) -------------------------------------------------------------------------------- Physical Exam Details Patient Name: Pagett, [name] Date of Service: 03/20/2017 12:30 PM Medical Record Patient Account Number: [id number] Number: Treating RN: Cornell Barman 11-18-1941 (75 y.o. Other Clinician: Date of Birth/Sex: Female) Treating Shawna Kiener Primary Care Provider: Frazier Richards Provider/Extender: G Referring Provider: Jenelle Mages in Treatment: 7 Constitutional Patient is hypotensive but does not look acutely ill. Pulse regular and within target range for patient.Marland Kitchen Respirations regular, non-labored and within target range.. Temperature is normal and within the target range for the patient.. Patient's appearance is neat and clean. Appears in no acute distress. Well nourished and  well developed.. Notes wound exam; Left heel requires some removal of surface slough. No infection wound improved with granulated albeit deep base -right calf wound still with exposed tendon. -small superficial healthy looking wound over the sacrum Electronic Signature(s) Signed: 03/20/2017 5:52:53 PM By: Linton Ham MD Entered By: Linton Ham on 03/20/2017 14:43:32 Stockinger, Raul Del ([id number]) -------------------------------------------------------------------------------- Physician Orders Details Patient Name: Tufo, Tiyonna B. Date of Service: 03/20/2017 12:30 PM Medical Record Patient Account Number: [id number] Number: Treating RN: Cornell Barman [birth year] (75 y.o. Other Clinician: Date of Birth/Sex: Female) Treating Ladarrian Asencio Primary Care Provider: Frazier Richards Provider/Extender: G Referring Provider: Jenelle Mages in Treatment: 7 Verbal / Phone Orders: No Diagnosis Coding Wound Cleansing Wound #5 Sacrum o Clean wound with Normal Saline. Wound #6 Left Calcaneus o Clean wound with Normal Saline. Wound #8 Right,Distal,Lateral Lower Leg o Clean wound with Normal Saline. Anesthetic Wound #5 Sacrum o Topical Lidocaine 4% cream applied to wound bed prior to debridement Wound #6 Left Calcaneus o Topical Lidocaine 4% cream applied to wound bed prior to debridement Wound #8 Right,Distal,Lateral Lower Leg o Topical Lidocaine 4% cream applied to wound bed prior to debridement Skin Barriers/Peri-Wound Care Wound #5 Sacrum o Skin Prep Wound #6 Left Calcaneus o Skin Prep Wound #8 Right,Distal,Lateral Lower Leg o Skin Prep Primary Wound Dressing Wound #5 Sacrum o Prisma Ag Headlee, Lilianna B. ([id number]) Secondary Dressing Wound #5 Sacrum o Boardered Foam Dressing Wound #6 Left Calcaneus o Gauze and Kerlix/Conform - Coban to secure Wound #8 Right,Distal,Lateral Lower Leg o Gauze and Kerlix/Conform - Coban to  secure Dressing Change Frequency Wound #6 Left Calcaneus o Change dressing every week - DO NOT TOUCH. SKIN SUBSTITUTE applied Wound #8 Right,Distal,Lateral Lower Leg o Change dressing every week - DO NOT TOUCH. SKIN SUBSTITUTE applied Wound #5 Sacrum o Change dressing every other day. Follow-up Appointments Wound #5 Sacrum o Return Appointment in 1 week. Wound #6 Left Calcaneus o Return Appointment in 1 week. Wound #8 Right,Distal,Lateral Lower Leg o Return  Appointment in 1 week. Off-Loading Wound #5 Sacrum o Turn and reposition every 2 hours Wound #6 Left Calcaneus o Turn and reposition every 2 hours Wound #8 Right,Distal,Lateral Lower Leg o Turn and reposition every 2 hours Additional Orders / Instructions Wound #5 Sacrum o Increase protein intake. Wound #6 Left Calcaneus o Increase protein intake. Wound #8 Right,Distal,Lateral Lower Leg Faria, Bridget B. ([id number]) o Increase protein intake. Advanced Therapies Wound #6 Left Calcaneus o Theraskin application in clinic; including contact layer, fixation with steri strips, dry gauze and cover dressing. Wound #8 Right,Distal,Lateral Lower Leg o Theraskin application in clinic; including contact layer, fixation with steri strips, dry gauze and cover dressing. Electronic Signature(s) Signed: 03/20/2017 5:52:53 PM By: Linton Ham MD Signed: 03/21/2017 8:43:34 AM By: Gretta Cool RN, BSN, Kim RN, BSN Entered By: Gretta Cool, RN, BSN, Kim on 03/20/2017 13:54:29 Kildow, Raul Del ([id number]) -------------------------------------------------------------------------------- Problem List Details Patient Name: Hidalgo, Susette B. Date of Service: 03/20/2017 12:30 PM Medical Record Patient Account Number: [id number] Number: Treating RN: Cornell Barman 03/06/1941 (75 y.o. 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 Date of Service: 03/20/2017 12:30 PM Medical Record Patient Account Number: [id number] Number: Treating RN: Cornell Barman 16-May-1941 (75 y.o. Other Clinician: Date of Birth/Sex: Female) Treating Serria Sloma Primary Care Provider: Frazier Richards Provider/Extender: G Referring Provider: Jenelle Mages in Treatment: 7 Subjective Chief Complaint Information obtained from Patient 01/05/17; this is a 76 year old disabled woman from [facility] who is here for review of multiple wounds. She is accompanied by her son History of Present Illness (HPI) 12/26/16; this is a 76 year old woman who is from [facility] skilled facility. She is accompanied by her son who provides most of the history. He tells me he was called in early December to report a fall in the shower room at the facility. An x-ray showed a right supracondylar distal femur fracture. She was put in a brace and treated nonoperatively. She was also admitted to hospital in mid December and discharged on 12/18. During this admission she had sepsis secondary to an acute UTI. In reviewing [facility] she is also quoted during that admission as having a left ischial tuberosity wound coccyx wound and a deep tissue injury over the left heel. Her son  states that sometime after this and when she returned to see her orthopedic surgeon she developed wound areas under the brace on the right lateral leg have both distally and a smaller wound superiorly on the lateral right leg. I am not completely certain what the facility is placing on these wounds. The patient has a history of atrial fibrillation. Her son tells me that years ago she had a fairly significant stroke but maintained a good quality of life up until 2 years ago when she developed dementia then things have really been going downhill since. She is been at a nursing facility for the last 18 months [[facility]] barely her nutritional intake is marginal but improving. Her son  states that he is there and a lot of meals and she eats 75%. She is receiving nutritional supplements as well as protein supplements she has a level II pressure-relief surface and bunny boots bilaterally on both feet 01/02/17; patient returns this week. Not much change. I crosshatched the left heel last week they have been applying Santyl. The extensive areas on the right lateral leg especially distally with a large area of exposed tendon we have been applying silver collagen. To her coccyx wound also silver collagen. According to her son who visits her daily and participates in her care her intake is fairly good she drinks well takes her nutritional supplements. She is at [facility] skilled facility. 01/30/17; this is a patient I had seen 2 times last month. She was admitted to hospital from 2/25 through 2/28 with urosepsis and a seizure. She is at [facility]. The son has hospice services there but he uses them more just to add care to his mother rather than for any end-of-life mentality. We therefore of maintained a fairly aggressive posture to order wounds. This is in contradistinction to the last part of her discharge summary in February in which the discharging doctor as a note in bold font  expressing that this Basic, Mirel B. ([id number]) patient is under hospice care and should be treated as such. I've never found that to be what her son is actually saying in any case she has an unstageable eschar over the left heel, a large wound on the right calf with extensive exposed tendon and surrounding granulation, a smaller wound on the upper right calf which appears improved and a stage III sacral pressure ulcer which is also improved since the last time I saw her 02/13/17; I spoke to hospice and palliative care of [facility] last week and talked about my perception wound care that'll be necessary for Mrs. this is based on relatively extensive discussions have had with her son who has a strong desire to get these wounds the best chance to heal. We have been using silver collagen on the sacral wound, right lateral lateral leg in 2 spots and Santyl to the left heel. Her son says she is eating better and they are being religious about turning her at the facility with constant supervision of family 02/27/17; since our last visit the patient's son has disenrolled her from hospice. This increases our flexibility about what we can do to attempt to heal her pressure areas. Currently we are treating a left heel wound, right lateral leg wound and a lower sacral wound. The area on her sacrum and heel are pressure ulcers while the area on the right lateral leg was a pressure injury from an orthopedic brace. 03/13/17; using prisma to all wounds left heel,right lateral calf with tendon exposure. We have TheraSkin approved for hext week 03/20/17; two wounds on the left heel and right lateral calf. with tendon exposure. TheraSkin #1. Small wound on the lower sacrum. Objective Constitutional Patient is hypotensive but does not look acutely ill. Pulse regular and within target range for patient.Marland Kitchen Respirations regular, non-labored and within target range.. Temperature is normal and within the  target range for the patient.. Patient's appearance is neat and clean. Appears in no acute distress. Well nourished and well developed.. Vitals Time Taken: 12:45 PM, Pulse: 87 bpm, Respiratory Rate: 16 breaths/min, Blood Pressure: 89/61 mmHg. General Notes: MD notified of BP. General Notes: wound exam; Left heel requires some removal of surface slough. No infection wound improved  with granulated albeit deep base -right calf wound still with exposed tendon. -small superficial healthy looking wound over the sacrum Integumentary (Hair, Skin) Wound #5 status is Open. Original cause of wound was Pressure Injury. The wound is located on the Sacrum. The wound measures 0.6cm length x 1.8cm width x 0.1cm depth; 0.848cm^2 area and 0.085cm^3 volume. The wound is limited to skin breakdown. There is no tunneling or undermining noted. There is a large amount of serous drainage noted. The wound margin is flat and intact. There is large (67-100%) pink Pourciau, Jaeley B. ([id number]) granulation within the wound bed. There is no necrotic tissue within the wound bed. The periwound skin appearance did not exhibit: Callus, Crepitus, Excoriation, Induration, Rash, Scarring, Dry/Scaly, Maceration, Atrophie Blanche, Cyanosis, Ecchymosis, Hemosiderin Staining, Mottled, Pallor, Rubor, Erythema. The periwound has tenderness on palpation. Wound #6 status is Open. Original cause of wound was Pressure Injury. The wound is located on the Left Calcaneus. The wound measures 2.1cm length x 3.5cm width x 0.1cm depth; 5.773cm^2 area and 0.577cm^3 volume. There is Fat Layer (Subcutaneous Tissue) Exposed exposed. There is undermining starting at 9:00 and ending at 1:00 with a maximum distance of 0.5cm. There is a large amount of serous drainage noted. The wound margin is epibole. There is no granulation within the wound bed. There is a large (67-100%) amount of necrotic tissue within the wound bed including Eschar. The periwound  skin appearance exhibited: Maceration, Ecchymosis. The periwound skin appearance did not exhibit: Callus, Crepitus, Excoriation, Induration, Rash, Scarring, Dry/Scaly, Atrophie Blanche, Cyanosis, Hemosiderin Staining, Mottled, Pallor, Rubor, Erythema. The periwound has tenderness on palpation. Wound #7 status is Healed - Epithelialized. Original cause of wound was Pressure Injury. The wound is located on the Right,Proximal,Lateral Lower Leg. The wound measures 0cm length x 0cm width x 0cm depth; 0cm^2 area and 0cm^3 volume. Wound #8 status is Open. Original cause of wound was Pressure Injury. The wound is located on the Right,Distal,Lateral Lower Leg. The wound measures 7cm length x 1.8cm width x 0.3cm depth; 9.896cm^2 area and 2.969cm^3 volume. There is tendon exposed. There is undermining starting at 12:00 and ending at 1:00 with a maximum distance of 0.7cm. There is a large amount of serous drainage noted. The wound margin is epibole. There is medium (34-66%) pink granulation within the wound bed. There is no necrotic tissue within the wound bed. The periwound skin appearance exhibited: Ecchymosis, Hemosiderin Staining. The periwound skin appearance did not exhibit: Callus, Crepitus, Excoriation, Induration, Rash, Scarring, Dry/Scaly, Maceration, Atrophie Blanche, Cyanosis, Mottled, Pallor, Rubor, Erythema. Periwound temperature was noted as No Abnormality. The periwound has tenderness on palpation. 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 A skin graft procedure using a bioengineered Bonelli, Oceanna B. ([id number]) skin substitute/cellular or tissue based product was performed by Ricard Dillon, MD. Jannifer Hick was applied and secured with  Steri-Strips. 39 sq cm of product was utilized and 0 sq cm was wasted. Post Application, mepitel was applied. A Time Out was conducted at 13:24, prior to the start of the procedure. The procedure was tolerated well with a pain level of 0 throughout and a pain level of 0 following the procedure. Post procedure Diagnosis Wound #6: Same as Pre-Procedure . Wound #8 Wound #8 is a Pressure Ulcer located on the  Right,Distal,Lateral Lower Leg. A skin graft procedure using a bioengineered skin substitute/cellular or tissue based product was performed by Ricard Dillon, MD. Jannifer Hick was applied and secured with Steri-Strips. 39 sq cm of product was utilized and 0 sq cm was wasted. Post Application, mepitel was applied. A Time Out was conducted at 13:24, prior to the start of the procedure. The procedure was tolerated well with a pain level of 0 throughout and a pain level of 0 following the procedure. Post procedure Diagnosis Wound #8: Same as Pre-Procedure . Plan Wound Cleansing: Wound #5 Sacrum: Clean wound with Normal Saline. Wound #6 Left Calcaneus: Clean wound with Normal Saline. Wound #8 Right,Distal,Lateral Lower Leg: Clean wound with Normal Saline. Anesthetic: Wound #5 Sacrum: Topical Lidocaine 4% cream applied to wound bed prior to debridement Wound #6 Left Calcaneus: Topical Lidocaine 4% cream applied to wound bed prior to debridement Wound #8 Right,Distal,Lateral Lower Leg: Topical Lidocaine 4% cream applied to wound bed prior to debridement Skin Barriers/Peri-Wound Care: Wound #5 Sacrum: Skin Prep Wound #6 Left Calcaneus: Skin Prep Wound #8 Right,Distal,Lateral Lower Leg: Skin Prep Primary Wound Dressing: Wound #5 Sacrum: Prisma Ag Secondary Dressing: Wound #5 Sacrum: Stallings, Asami B. ([id number]) Boardered Foam Dressing Wound #6 Left Calcaneus: Gauze and Kerlix/Conform - Coban to secure Wound #8 Right,Distal,Lateral Lower Leg: Gauze and Kerlix/Conform - Coban to  secure Dressing Change Frequency: Wound #6 Left Calcaneus: Change dressing every week - DO NOT TOUCH. SKIN SUBSTITUTE applied Wound #8 Right,Distal,Lateral Lower Leg: Change dressing every week - DO NOT TOUCH. SKIN SUBSTITUTE applied Wound #5 Sacrum: Change dressing every other day. Follow-up Appointments: Wound #5 Sacrum: Return Appointment in 1 week. Wound #6 Left Calcaneus: Return Appointment in 1 week. Wound #8 Right,Distal,Lateral Lower Leg: Return Appointment in 1 week. Off-Loading: Wound #5 Sacrum: Turn and reposition every 2 hours Wound #6 Left Calcaneus: Turn and reposition every 2 hours Wound #8 Right,Distal,Lateral Lower Leg: Turn and reposition every 2 hours Additional Orders / Instructions: Wound #5 Sacrum: Increase protein intake. Wound #6 Left Calcaneus: Increase protein intake. Wound #8 Right,Distal,Lateral Lower Leg: Increase protein intake. Advanced Therapies: Wound #6 Left Calcaneus: Theraskin application in clinic; including contact layer, fixation with steri strips, dry gauze and cover dressing. Wound #8 Right,Distal,Lateral Lower Leg: Theraskin application in clinic; including contact layer, fixation with steri strips, dry gauze and cover dressing. 1 theraSkin number 1 in standard fashion. 2 prisma to the sacrum with border foam Abbasi, Gradie B. ([id number]) Electronic Signature(s) Signed: 03/27/2017 11:46:09 AM By: Gretta Cool RN, BSN, Kim RN, BSN Signed: 03/27/2017 5:27:50 PM By: Linton Ham MD Previous Signature: 03/20/2017 5:52:53 PM Version By: Linton Ham MD Entered By: Gretta Cool RN, BSN, Kim on 03/27/2017 11:46:09 Huizenga, Raul Del ([id number]) -------------------------------------------------------------------------------- [place] Details Patient Name: Keplinger, [name] Date of Service: 03/20/2017 Medical Record Patient Account Number: [id number] Number: Treating RN: Cornell Barman Apr 17, 1941 (75 y.o. Other Clinician: Date of  Birth/Sex: Female) Treating Dyana Magner Primary Care Provider: Frazier Richards Provider/Extender: G Referring Provider: Jenelle Mages in Treatment: 7 Diagnosis Coding ICD-10 Codes Code Description L89.153 Pressure ulcer of sacral region, stage 3 L89.620 Pressure ulcer of left heel, unstageable L97.213 Non-pressure chronic ulcer of right calf with necrosis of muscle Facility Procedures CPT4 Code Description: [id number] - SKIN SUB GRAFT TRNK/ARM/LEG ICD-10 Description Diagnosis L97.213 Non-pressure chronic ulcer of right calf with necro L89.620 Pressure ulcer of left heel, unstageable Modifier: sis of muscle Quantity: 1 CPT4 Code Description: [id number] Q4121- Theraskin per 1sq cm large -39 sq cm  Modifier: Quantity: 66 Physician Procedures CPT4 Code Description: [id number] [place] - WC PHYS SKIN SUB GRAFT TRNK/ARM/LEG ICD-10 Description Diagnosis P82.423 Non-pressure chronic ulcer of right calf with necro L89.620 Pressure ulcer of left heel, unstageable Modifier: sis of muscle Quantity: 1 Electronic Signature(s) Signed: 03/20/2017 3:11:47 PM By: Gretta Cool RN, BSN, Kim RN, BSN Signed: 03/20/2017 5:52:53 PM By: Linton Ham MD Entered By: Gretta Cool, RN, BSN, Kim on 03/20/2017 15:11:47

## 2017-03-26 ENCOUNTER — Emergency Department: Payer: Medicare Other

## 2017-03-26 ENCOUNTER — Inpatient Hospital Stay
Admission: EM | Admit: 2017-03-26 | Discharge: 2017-04-19 | DRG: 698 | Disposition: E | Payer: Medicare Other | Attending: Specialist | Admitting: Specialist

## 2017-03-26 ENCOUNTER — Ambulatory Visit: Payer: Medicare Other

## 2017-03-26 ENCOUNTER — Encounter: Payer: Self-pay | Admitting: Emergency Medicine

## 2017-03-26 DIAGNOSIS — E669 Obesity, unspecified: Secondary | ICD-10-CM | POA: Diagnosis present

## 2017-03-26 DIAGNOSIS — Z87440 Personal history of urinary (tract) infections: Secondary | ICD-10-CM | POA: Diagnosis not present

## 2017-03-26 DIAGNOSIS — L89159 Pressure ulcer of sacral region, unspecified stage: Secondary | ICD-10-CM | POA: Diagnosis present

## 2017-03-26 DIAGNOSIS — I482 Chronic atrial fibrillation: Secondary | ICD-10-CM | POA: Diagnosis present

## 2017-03-26 DIAGNOSIS — E875 Hyperkalemia: Secondary | ICD-10-CM | POA: Diagnosis present

## 2017-03-26 DIAGNOSIS — N390 Urinary tract infection, site not specified: Secondary | ICD-10-CM

## 2017-03-26 DIAGNOSIS — G9340 Encephalopathy, unspecified: Secondary | ICD-10-CM

## 2017-03-26 DIAGNOSIS — K219 Gastro-esophageal reflux disease without esophagitis: Secondary | ICD-10-CM | POA: Diagnosis present

## 2017-03-26 DIAGNOSIS — Z66 Do not resuscitate: Secondary | ICD-10-CM | POA: Diagnosis present

## 2017-03-26 DIAGNOSIS — N179 Acute kidney failure, unspecified: Secondary | ICD-10-CM | POA: Diagnosis not present

## 2017-03-26 DIAGNOSIS — G40909 Epilepsy, unspecified, not intractable, without status epilepticus: Secondary | ICD-10-CM | POA: Diagnosis present

## 2017-03-26 DIAGNOSIS — R402432 Glasgow coma scale score 3-8, at arrival to emergency department: Secondary | ICD-10-CM | POA: Diagnosis present

## 2017-03-26 DIAGNOSIS — J9600 Acute respiratory failure, unspecified whether with hypoxia or hypercapnia: Secondary | ICD-10-CM | POA: Diagnosis present

## 2017-03-26 DIAGNOSIS — Z96641 Presence of right artificial hip joint: Secondary | ICD-10-CM | POA: Diagnosis present

## 2017-03-26 DIAGNOSIS — J9690 Respiratory failure, unspecified, unspecified whether with hypoxia or hypercapnia: Secondary | ICD-10-CM

## 2017-03-26 DIAGNOSIS — Z7901 Long term (current) use of anticoagulants: Secondary | ICD-10-CM

## 2017-03-26 DIAGNOSIS — R4182 Altered mental status, unspecified: Secondary | ICD-10-CM | POA: Diagnosis present

## 2017-03-26 DIAGNOSIS — R6521 Severe sepsis with septic shock: Secondary | ICD-10-CM | POA: Diagnosis not present

## 2017-03-26 DIAGNOSIS — Z79899 Other long term (current) drug therapy: Secondary | ICD-10-CM | POA: Diagnosis not present

## 2017-03-26 DIAGNOSIS — Y846 Urinary catheterization as the cause of abnormal reaction of the patient, or of later complication, without mention of misadventure at the time of the procedure: Secondary | ICD-10-CM | POA: Diagnosis present

## 2017-03-26 DIAGNOSIS — R6520 Severe sepsis without septic shock: Secondary | ICD-10-CM

## 2017-03-26 DIAGNOSIS — Z8673 Personal history of transient ischemic attack (TIA), and cerebral infarction without residual deficits: Secondary | ICD-10-CM

## 2017-03-26 DIAGNOSIS — N824 Other female intestinal-genital tract fistulae: Secondary | ICD-10-CM | POA: Diagnosis present

## 2017-03-26 DIAGNOSIS — T83511A Infection and inflammatory reaction due to indwelling urethral catheter, initial encounter: Secondary | ICD-10-CM | POA: Diagnosis present

## 2017-03-26 DIAGNOSIS — E119 Type 2 diabetes mellitus without complications: Secondary | ICD-10-CM | POA: Diagnosis present

## 2017-03-26 DIAGNOSIS — A419 Sepsis, unspecified organism: Secondary | ICD-10-CM

## 2017-03-26 DIAGNOSIS — E872 Acidosis: Secondary | ICD-10-CM | POA: Diagnosis present

## 2017-03-26 DIAGNOSIS — G9341 Metabolic encephalopathy: Secondary | ICD-10-CM | POA: Diagnosis present

## 2017-03-26 DIAGNOSIS — Z6841 Body Mass Index (BMI) 40.0 and over, adult: Secondary | ICD-10-CM

## 2017-03-26 DIAGNOSIS — Z7952 Long term (current) use of systemic steroids: Secondary | ICD-10-CM | POA: Diagnosis not present

## 2017-03-26 DIAGNOSIS — F0390 Unspecified dementia without behavioral disturbance: Secondary | ICD-10-CM | POA: Diagnosis present

## 2017-03-26 LAB — PROTIME-INR
INR: 1.41
Prothrombin Time: 17.4 seconds — ABNORMAL HIGH (ref 11.4–15.2)

## 2017-03-26 LAB — BLOOD GAS, ARTERIAL
ACID-BASE DEFICIT: 18.3 mmol/L — AB (ref 0.0–2.0)
ALLENS TEST (PASS/FAIL): POSITIVE — AB
Acid-base deficit: 10.5 mmol/L — ABNORMAL HIGH (ref 0.0–2.0)
Acid-base deficit: 11.8 mmol/L — ABNORMAL HIGH (ref 0.0–2.0)
BICARBONATE: 15.1 mmol/L — AB (ref 20.0–28.0)
Bicarbonate: 13.7 mmol/L — ABNORMAL LOW (ref 20.0–28.0)
Bicarbonate: 9.8 mmol/L — ABNORMAL LOW (ref 20.0–28.0)
FIO2: 1
FIO2: 0.5
FIO2: 0.5
LHR: 14 {breaths}/min
MECHANICAL RATE: 30
MECHVT: 450 mL
Mechanical Rate: 16
O2 SAT: 99.9 %
O2 SAT: 95.3 %
O2 Saturation: 96.9 %
PCO2 ART: 30 mmHg — AB (ref 32.0–48.0)
PEEP/CPAP: 5 cmH2O
PEEP: 5 cmH2O
PH ART: 7.12 — AB (ref 7.350–7.450)
PO2 ART: 106 mmHg (ref 83.0–108.0)
Patient temperature: 37
Patient temperature: 37
Patient temperature: 37
RATE: 16 resp/min
RATE: 30 resp/min
VT: 450 mL
VT: 450 mL
pCO2 arterial: 26 mmHg — ABNORMAL LOW (ref 32.0–48.0)
pCO2 arterial: 37 mmHg (ref 32.0–48.0)
pH, Arterial: 7.33 — ABNORMAL LOW (ref 7.350–7.450)
pH, Arterial: 7.22 — ABNORMAL LOW (ref 7.350–7.450)
pO2, Arterial: 332 mmHg — ABNORMAL HIGH (ref 83.0–108.0)
pO2, Arterial: 102 mmHg (ref 83.0–108.0)

## 2017-03-26 LAB — CBC WITH DIFFERENTIAL/PLATELET
BASOS PCT: 0 %
Basophils Absolute: 0 10*3/uL (ref 0–0.1)
EOS ABS: 0 10*3/uL (ref 0–0.7)
Eosinophils Relative: 0 %
HCT: 44.8 % (ref 35.0–47.0)
HEMOGLOBIN: 14.2 g/dL (ref 12.0–16.0)
Lymphocytes Relative: 5 %
Lymphs Abs: 1 10*3/uL (ref 1.0–3.6)
MCH: 31.2 pg (ref 26.0–34.0)
MCHC: 31.7 g/dL — ABNORMAL LOW (ref 32.0–36.0)
MCV: 98.4 fL (ref 80.0–100.0)
Monocytes Absolute: 0.9 10*3/uL (ref 0.2–0.9)
Monocytes Relative: 4 %
NEUTROS PCT: 91 %
Neutro Abs: 19.6 10*3/uL — ABNORMAL HIGH (ref 1.4–6.5)
Platelets: 278 10*3/uL (ref 150–440)
RBC: 4.55 MIL/uL (ref 3.80–5.20)
RDW: 16.7 % — ABNORMAL HIGH (ref 11.5–14.5)
WBC: 21.5 10*3/uL — AB (ref 3.6–11.0)

## 2017-03-26 LAB — COMPREHENSIVE METABOLIC PANEL
ALK PHOS: 95 U/L (ref 38–126)
ALT: 59 U/L — ABNORMAL HIGH (ref 14–54)
ANION GAP: 10 (ref 5–15)
AST: 32 U/L (ref 15–41)
Albumin: 2.9 g/dL — ABNORMAL LOW (ref 3.5–5.0)
BUN: 26 mg/dL — ABNORMAL HIGH (ref 6–20)
CALCIUM: 9.1 mg/dL (ref 8.9–10.3)
CO2: 23 mmol/L (ref 22–32)
Chloride: 107 mmol/L (ref 101–111)
Creatinine, Ser: 1.03 mg/dL — ABNORMAL HIGH (ref 0.44–1.00)
GFR calc non Af Amer: 51 mL/min — ABNORMAL LOW (ref >60)
GFR, EST AFRICAN AMERICAN: 59 mL/min — AB (ref >60)
Glucose, Bld: 71 mg/dL (ref 65–99)
POTASSIUM: 4.4 mmol/L (ref 3.5–5.1)
SODIUM: 140 mmol/L (ref 135–145)
TOTAL PROTEIN: 6.4 g/dL — AB (ref 6.5–8.1)
Total Bilirubin: 1.4 mg/dL — ABNORMAL HIGH (ref 0.3–1.2)

## 2017-03-26 LAB — POTASSIUM (ARMC VASCULAR LAB ONLY): Potassium (ARMC vascular lab): 4.5 (ref 3.5–5.1)

## 2017-03-26 LAB — URINALYSIS, COMPLETE (UACMP) WITH MICROSCOPIC
Specific Gravity, Urine: 1.028 (ref 1.005–1.030)
Squamous Epithelial / LPF: NONE SEEN

## 2017-03-26 LAB — TROPONIN I: Troponin I: 0.31 ng/mL (ref <0.03)

## 2017-03-26 LAB — GLUCOSE, CAPILLARY: Glucose-Capillary: <10 mg/dL — CL (ref 65–99)

## 2017-03-26 LAB — LACTIC ACID, PLASMA: Lactic Acid, Venous: 5.1 mmol/L (ref 0.5–1.9)

## 2017-03-26 MED ORDER — HYDROCORTISONE NA SUCCINATE PF 100 MG IJ SOLR
50.0000 mg | Freq: Four times a day (QID) | INTRAMUSCULAR | Status: DC
Start: 2017-03-26 — End: 2017-03-27
  Administered 2017-03-26: 50 mg via INTRAVENOUS

## 2017-03-26 MED ORDER — MEMANTINE HCL 10 MG PO TABS
10.0000 mg | ORAL_TABLET | Freq: Two times a day (BID) | ORAL | Status: DC
  Filled 2017-03-26: qty 1

## 2017-03-26 MED ORDER — PIPERACILLIN-TAZOBACTAM 3.375 G IVPB 30 MIN
3.3750 g | Freq: Once | INTRAVENOUS | Status: AC
  Administered 2017-03-26: 3.375 g via INTRAVENOUS

## 2017-03-26 MED ORDER — NOREPINEPHRINE BITARTRATE 1 MG/ML IV SOLN
0.0000 ug/min | Freq: Once | INTRAVENOUS | Status: AC
  Administered 2017-03-26: 8 ug/min via INTRAVENOUS
  Filled 2017-03-26: qty 4

## 2017-03-26 MED ORDER — FAMOTIDINE IN NACL 20-0.9 MG/50ML-% IV SOLN
20.0000 mg | INTRAVENOUS | Status: DC
  Administered 2017-03-26: 20 mg via INTRAVENOUS
  Filled 2017-03-26: qty 50

## 2017-03-26 MED ORDER — QUETIAPINE FUMARATE 25 MG PO TABS: 50.0000 mg | ORAL_TABLET | Freq: Every day | ORAL | Status: DC

## 2017-03-26 MED ORDER — FENTANYL CITRATE (PF) 100 MCG/2ML IJ SOLN: 50.0000 ug | INTRAMUSCULAR | Status: DC | PRN

## 2017-03-26 MED ORDER — AMIODARONE HCL IN DEXTROSE 360-4.14 MG/200ML-% IV SOLN
60.0000 mg/h | INTRAVENOUS | Status: AC
Start: 2017-03-26 — End: 2017-03-26
  Administered 2017-03-26: 60 mg/h via INTRAVENOUS
  Filled 2017-03-26: qty 200

## 2017-03-26 MED ORDER — ONDANSETRON HCL 4 MG PO TABS: 4.0000 mg | ORAL_TABLET | Freq: Four times a day (QID) | ORAL | Status: DC | PRN

## 2017-03-26 MED ORDER — LEVETIRACETAM 500 MG/5ML IV SOLN
1000.0000 mg | Freq: Two times a day (BID) | INTRAVENOUS | Status: DC
  Administered 2017-03-26: 1000 mg via INTRAVENOUS
  Filled 2017-03-26 (×3): qty 10

## 2017-03-26 MED ORDER — DEXTROSE 50 % IV SOLN: 1.0000 | Freq: Once | INTRAVENOUS | Status: DC

## 2017-03-26 MED ORDER — SODIUM BICARBONATE 8.4 % IV SOLN
INTRAVENOUS | Status: DC
  Administered 2017-03-26: 17:00:00 via INTRAVENOUS
  Filled 2017-03-26 (×2): qty 850

## 2017-03-26 MED ORDER — ONDANSETRON HCL 4 MG/2ML IJ SOLN: 4.0000 mg | Freq: Four times a day (QID) | INTRAMUSCULAR | Status: DC | PRN

## 2017-03-26 MED ORDER — ETOMIDATE 2 MG/ML IV SOLN
20.0000 mg | Freq: Once | INTRAVENOUS | Status: AC
  Administered 2017-03-26: 20 mg via INTRAVENOUS

## 2017-03-26 MED ORDER — SODIUM CHLORIDE 0.9 % IV BOLUS (SEPSIS)
1000.0000 mL | Freq: Once | INTRAVENOUS | Status: AC
  Administered 2017-03-26: 1000 mL via INTRAVENOUS

## 2017-03-26 MED ORDER — HYDROCORTISONE NA SUCCINATE PF 100 MG IJ SOLR
50.0000 mg | Freq: Four times a day (QID) | INTRAMUSCULAR | Status: DC
  Administered 2017-03-26: 50 mg via INTRAVENOUS
  Filled 2017-03-26: qty 2

## 2017-03-26 MED ORDER — CEFEPIME HCL 2 G IJ SOLR
2.0000 g | Freq: Two times a day (BID) | INTRAMUSCULAR | Status: DC
  Administered 2017-03-26: 2 g via INTRAVENOUS
  Filled 2017-03-26 (×2): qty 2

## 2017-03-26 MED ORDER — APIXABAN 5 MG PO TABS: 5.0000 mg | ORAL_TABLET | Freq: Two times a day (BID) | ORAL | Status: DC

## 2017-03-26 MED ORDER — AMIODARONE LOAD VIA INFUSION
150.0000 mg | Freq: Once | INTRAVENOUS | Status: AC
  Administered 2017-03-26: 150 mg via INTRAVENOUS
  Filled 2017-03-26: qty 83.34

## 2017-03-26 MED ORDER — VANCOMYCIN HCL IN DEXTROSE 1-5 GM/200ML-% IV SOLN
1000.0000 mg | Freq: Once | INTRAVENOUS | Status: AC
  Administered 2017-03-26: 1000 mg via INTRAVENOUS

## 2017-03-26 MED ORDER — FENTANYL CITRATE (PF) 100 MCG/2ML IJ SOLN
50.0000 ug | INTRAMUSCULAR | Status: DC | PRN
  Filled 2017-03-26 (×2): qty 2

## 2017-03-26 MED ORDER — NOREPINEPHRINE BITARTRATE 1 MG/ML IV SOLN
0.0000 ug/min | Freq: Once | INTRAVENOUS | Status: AC
  Administered 2017-03-26: 40 ug/min via INTRAVENOUS
  Filled 2017-03-26: qty 16

## 2017-03-26 MED ORDER — COLLAGENASE 250 UNIT/GM EX OINT
1.0000 "application " | TOPICAL_OINTMENT | CUTANEOUS | Status: DC | PRN
  Filled 2017-03-26: qty 30

## 2017-03-26 MED ORDER — DIVALPROEX SODIUM 125 MG PO CSDR
125.0000 mg | DELAYED_RELEASE_CAPSULE | Freq: Two times a day (BID) | ORAL | Status: DC
Start: 2017-03-26 — End: 2017-03-27
  Filled 2017-03-26: qty 1

## 2017-03-26 MED ORDER — PIPERACILLIN-TAZOBACTAM 3.375 G IVPB
INTRAVENOUS | Status: AC
  Administered 2017-03-26: 3.375 g via INTRAVENOUS
  Filled 2017-03-26: qty 50

## 2017-03-26 MED ORDER — INSULIN ASPART 100 UNIT/ML ~~LOC~~ SOLN: 0.0000 [IU] | SUBCUTANEOUS | Status: DC

## 2017-03-26 MED ORDER — ACETAMINOPHEN 325 MG PO TABS: 650.0000 mg | ORAL_TABLET | Freq: Four times a day (QID) | ORAL | Status: DC | PRN

## 2017-03-26 MED ORDER — VANCOMYCIN HCL IN DEXTROSE 1-5 GM/200ML-% IV SOLN
INTRAVENOUS | Status: AC
  Administered 2017-03-26: 1000 mg via INTRAVENOUS
  Filled 2017-03-26: qty 200

## 2017-03-26 MED ORDER — DEXTROSE 50 % IV SOLN
INTRAVENOUS | Status: AC
  Filled 2017-03-26: qty 50

## 2017-03-26 MED ORDER — SODIUM CHLORIDE 0.9 % IV BOLUS (SEPSIS)
500.0000 mL | Freq: Once | INTRAVENOUS | Status: AC
  Administered 2017-03-26: 500 mL via INTRAVENOUS

## 2017-03-26 MED ORDER — DEXTROSE 5 % IV SOLN
0.0000 ug/min | INTRAVENOUS | Status: DC
  Filled 2017-03-26 (×2): qty 16

## 2017-03-26 MED ORDER — VANCOMYCIN HCL IN DEXTROSE 1-5 GM/200ML-% IV SOLN
1000.0000 mg | INTRAVENOUS | Status: DC
  Filled 2017-03-26 (×2): qty 200

## 2017-03-26 MED ORDER — ACETAMINOPHEN 650 MG RE SUPP: 650.0000 mg | Freq: Four times a day (QID) | RECTAL | Status: DC | PRN

## 2017-03-26 MED ORDER — AMIODARONE HCL IN DEXTROSE 360-4.14 MG/200ML-% IV SOLN
30.0000 mg/h | INTRAVENOUS | Status: DC
  Filled 2017-03-26: qty 200

## 2017-03-26 MED ORDER — PIPERACILLIN-TAZOBACTAM 3.375 G IVPB
3.3750 g | Freq: Three times a day (TID) | INTRAVENOUS | Status: DC
  Filled 2017-03-26 (×3): qty 50

## 2017-03-26 MED ORDER — HEPARIN SODIUM (PORCINE) 5000 UNIT/ML IJ SOLN
5000.0000 [IU] | Freq: Three times a day (TID) | INTRAMUSCULAR | Status: DC
  Administered 2017-03-26: 5000 [IU] via SUBCUTANEOUS
  Filled 2017-03-26: qty 1

## 2017-03-26 MED ORDER — SUCCINYLCHOLINE CHLORIDE 20 MG/ML IJ SOLN
100.0000 mg | Freq: Once | INTRAMUSCULAR | Status: AC
  Administered 2017-03-26: 100 mg via INTRAVENOUS

## 2017-03-26 MED ORDER — VASOPRESSIN 20 UNIT/ML IV SOLN
0.0300 [IU]/min | INTRAVENOUS | Status: DC
  Administered 2017-03-26: 0.03 [IU]/min via INTRAVENOUS
  Filled 2017-03-26: qty 2

## 2017-03-27 ENCOUNTER — Ambulatory Visit: Payer: Medicare Other | Admitting: Internal Medicine

## 2017-03-27 LAB — URINE CULTURE

## 2017-03-27 MED FILL — Medication: Qty: 1 | Status: AC

## 2017-03-28 LAB — GLUCOSE, CAPILLARY

## 2017-03-29 ENCOUNTER — Telehealth: Payer: Self-pay | Admitting: Pulmonary Disease

## 2017-03-29 NOTE — Telephone Encounter (Signed)
Death certificate received today and placed in DS folder for signature.

## 2017-03-29 NOTE — Telephone Encounter (Signed)
Recieved Death Certificate from [facility] Home_________ Delivered/Placed _Nurses Station ___________

## 2017-03-31 LAB — CULTURE, BLOOD (SINGLE): Culture: NO GROWTH

## 2017-04-02 NOTE — Telephone Encounter (Signed)
Spoke with [name] at [facility] to let them know that death cert is ready for pick up. Nothing further needed.

## 2017-04-19 NOTE — ED Notes (Signed)
Patient transported to CT wth RT and RN

## 2017-04-19 NOTE — Progress Notes (Signed)
eLink Physician-Brief Progress Note Patient Name: ROSALAND SHIFFMAN DOB: 1941-07-19 MRN: [id number]   Date of Service  2017/03/30  HPI/Events of Note  Near arrest , brady ro 35 likley hyper K , acidosis etc   Camera in room, bagging Directing near code Atropine x 1 Bicarb x 2 Rapid bag rate Send K, bmet, ABg stat  Will cal family and discuss code status  eICU Interventions       Intervention Category Major Interventions: Arrhythmia - evaluation and management;Code management / supervision  Raylene Miyamoto. 2017/03/30, 5:49 PM

## 2017-04-19 NOTE — ED Notes (Signed)
Portable chest being done at this time.

## 2017-04-19 NOTE — ED Notes (Addendum)
Pt difficult stick. Stuck by [employer] x 2 without success, attempted IV by Minette Brine RN x 1 unsuccessfully.  RT stuck for blood, Only able to obtain 6cc blood for blood culture placed blood into pediatric culture bottle. Called lab and notified Mahala Menghini of same.

## 2017-04-19 NOTE — ED Notes (Addendum)
Upon arrival to ED no seizure activity noted.  MD at bedside.  Pt placed on CM,  IO inj place per ems in left lower leg.  Pt unresponsive to painful stimuli.  ej placed by dr Jimmye Norman in left neck. Pt being bagged at this time.  Skin warm and dry,  Dark reddish color urine noted in catheter that was there pta.

## 2017-04-19 NOTE — Progress Notes (Signed)
CH responded to a PG. Pt expired. Family is spiritually strong.  CH provided empathetic listening, pastoral support, and prayer.     01-Apr-2017 1900  Clinical Encounter Type  Visited With Patient and family together;Health care provider  Visit Type Follow-up;Social support;Critical Care;Death  Referral From Nurse  Spiritual Encounters  Spiritual Needs Prayer;Emotional;Grief support

## 2017-04-19 NOTE — Progress Notes (Signed)
Pt's time of death 19:28.  Pt's heart stopped, there was no pulse noted.  Pt pronounced by [place] and [place], rn.  Family at bedside.  Supervisor notifed.

## 2017-04-19 NOTE — ED Notes (Signed)
Etomidate 20mg  being given at this time via ej.  Succ 100mg  given via ej at this time.

## 2017-04-19 NOTE — Consult Note (Addendum)
PULMONARY / CRITICAL CARE MEDICINE   Name: [name] MRN: [id number] DOB: 05-08-1941    ADMISSION DATE:  04-21-17  PT PROFILE:   76 y.o. F SNF resident with multiple medical problems inc CAF, dementia, colovaginal fistula,recurrent UTIs admitted with severe sepsis with shock. She was intubated in ED for AMS and vasopressors initiated for persistent shock.   MAJOR EVENTS/TEST RESULTS: 05/08 Admitted as above 05/08 CT head: Old right MCA infarct without acute abnormality  INDWELLING DEVICES:: ETT 05/08 >>  R femoral CVL 05/08 >>  L femoral A-line 05/08 >>   MICRO DATA: MRSA PCR 05/08 >>  Urine 05/08 >>  Blood 05/08 >>   ANTIMICROBIALS:  Vanc 05/08 >>  Cefepime 05/08 >>    HISTORY OF PRESENT ILLNESS:   8 F with a history of Chronic atrial fibrillation, history of colovaginal fistula, seizures, dementia, previous history of CVA, GERD, recurrent urinary tract infections who presents to the hospital from a skilled nursing facility due to altered mental status. Admitted via ED with increasing lethargy and confusion over the past few days. She was intubated in ED for AMS. She is admitted with septic shock due to UTI requiring vasopressors.   PAST MEDICAL HISTORY :  She  has a past medical history of A-fib ([place]) (02/14/2015); Arthritis; Atrial fibrillation ([place]); Colovaginal fistula; Complication of anesthesia; CVA (cerebral infarction) (02/14/2015); Dementia; GERD without esophagitis (02/14/2015); Glucose intolerance (impaired glucose tolerance); Gout; Hyperlipidemia; Recurrent UTI; and Seizure ([place]) (02/14/2015).  PAST SURGICAL HISTORY: She  has a past surgical history that includes Right total hip arthroplasty (1998); Right total hip revision arthroplasty for a periprosthetic fracture (2007); Cholecystectomy; and Bunionectomy (Right).  No Known Allergies  No current facility-administered medications on file prior to encounter.    Current Outpatient Prescriptions on File Prior to  Encounter  Medication Sig  . acetaminophen (TYLENOL) 325 MG tablet Take 2 tablets (650 mg total) by mouth every 6 (six) hours as needed for mild pain (or Fever >/= 101).  Marland Kitchen apixaban (ELIQUIS) 5 MG TABS tablet Take 1 tablet (5 mg total) by mouth 2 (two) times daily.  Marland Kitchen ascorbic acid (VITAMIN C) 500 MG tablet Take 500 mg by mouth 2 (two) times daily.   . collagenase (SANTYL) ointment Apply 1 application topically every other day. To left heel  . diltiazem (CARDIZEM CD) 180 MG 24 hr capsule Take 1 capsule (180 mg total) by mouth daily.  . divalproex (DEPAKOTE SPRINKLE) 125 MG capsule Take 1 capsule (125 mg total) by mouth 2 (two) times daily.  Marland Kitchen esomeprazole (NEXIUM) 20 MG capsule Take 20 mg by mouth daily at 12 noon.  . furosemide (LASIX) 20 MG tablet Take 20 mg by mouth daily.    Marland Kitchen guaifenesin (ROBITUSSIN) 100 MG/5ML syrup Take 100 mg by mouth 3 (three) times daily as needed for cough.  Marland Kitchen ketotifen (ZADITOR) 0.025 % ophthalmic solution Place 1 drop into both eyes daily as needed (for dry eyes).  Marland Kitchen levETIRAcetam (KEPPRA) 100 MG/ML solution Take 10 mLs (1,000 mg total) by mouth 2 (two) times daily. Take 10 ml by mouth two times a day for seizure  . memantine (NAMENDA) 10 MG tablet Take 10 mg by mouth 2 (two) times daily.  . mineral oil enema Place 1 enema rectally as needed for severe constipation.  . polyethylene glycol (MIRALAX) packet Take 17 g by mouth daily.  . potassium chloride (KLOR-CON) 10 MEQ CR tablet Take 10 mEq by mouth daily.    . QUEtiapine (SEROQUEL) 50  MG tablet Take 1 tablet (50 mg total) by mouth at bedtime.  . senna-docusate (SENOKOT S) 8.6-50 MG tablet Take 1 tablet by mouth daily.  . Amino Acids-Protein Hydrolys (FEEDING SUPPLEMENT, PRO-STAT 64,) LIQD Take 30 mLs by mouth daily.  Marland Kitchen donepezil (ARICEPT) 5 MG tablet Take 1 tablet (5 mg total) by mouth at bedtime. (Patient not taking: Reported on 01/13/2017)  . feeding supplement, ENSURE ENLIVE, (ENSURE ENLIVE) LIQD Take 237 mLs by  mouth 2 (two) times daily between meals.    FAMILY HISTORY:  Her indicated that her mother is deceased. She indicated that her father is deceased. She indicated that the status of her sister is unknown.    SOCIAL HISTORY: She  reports that she has never smoked. She has never used smokeless tobacco. She reports that she does not drink alcohol or use drugs.  REVIEW OF SYSTEMS:   Level V caveat  SUBJECTIVE:    VITAL SIGNS: BP 104/85   Pulse (!) 35   Temp (!) 95.8 F (35.4 C) (Rectal)   Resp (!) 24   Wt 232 lb (105.2 kg)   SpO2 (!) 8%   BMI 44.56 kg/m   HEMODYNAMICS:    VENTILATOR SETTINGS: Vent Mode: AC FiO2 (%):  [100 %] 100 % Set Rate:  [14 bmp] 14 bmp Vt Set:  [450 mL] 450 mL PEEP:  [5 cmH20] 5 cmH20  INTAKE / OUTPUT: No intake/output data recorded.  PHYSICAL EXAMINATION: General: RASS -4, chronically ill, intubated Neuro: PERRL, EOMI, MAEs, DTRs symmetric, minimal spontaneous movement HEENT: NCAT, sclerae white Cardiovascular: tachy, IRIR, no M noted Lungs: clear anteriorly Abdomen: obese, soft, decreased BS. NT Ext: cool, no edema, wound wraps on B LE Skin: sacral decub, BLE ulcers  LABS:  BMET  Recent Labs Lab 2017-04-09 0952  NA 140  K 4.4  CL 107  CO2 23  BUN 26*  CREATININE 1.03*  GLUCOSE 71    Electrolytes  Recent Labs Lab Apr 09, 2017 0952  CALCIUM 9.1    CBC  Recent Labs Lab 09-Apr-2017 0952  WBC 21.5*  HGB 14.2  HCT 44.8  PLT 278    Coag's  Recent Labs Lab Apr 09, 2017 0952  INR 1.41    Sepsis Markers  Recent Labs Lab Apr 09, 2017 0952  LATICACIDVEN 5.1*    ABG  Recent Labs Lab 04/09/17 1012  PHART 7.33*  PCO2ART 26*  PO2ART 332*    Liver Enzymes  Recent Labs Lab 04/09/2017 0952  AST 32  ALT 59*  ALKPHOS 95  BILITOT 1.4*  ALBUMIN 2.9*    Cardiac Enzymes  Recent Labs Lab 09-Apr-2017 0952  TROPONINI 0.31*    Glucose No results for input(s): GLUCAP in the last 168 hours.  CXR: Low volumes,  chronically elevated L hemidiaphragm, NAD   DISCUSSION:   ASSESSMENT / PLAN:  PULMONARY A: Acute respiratory failure - intubated due to AMS P:   Vent settings established Vent bundle implemented Daily SBT as indicated   CARDIOVASCULAR A:  Septic shock Minimally elevated Trop I Chronic AF, now with RVR P:  Norepinephrine for MAP goal 65 mmHg Vasopressin, hydrocortisone added Amiodarone for rate control Echocardiogram ordered  RENAL A:   AKI, oligo-anuric Metabolic acidosis Very poor candidate for HD P:   Monitor BMET intermittently Monitor I/Os Correct electrolytes as indicated HCO3 infusion ordered  GASTROINTESTINAL A:   No acute issues P:   SUP: IV famotidine Consider TFs 05/09  HEMATOLOGIC A:   Chronic apixaban for AF P:  DVT px: SQ heparin Monitor  CBC intermittently Transfuse per usual guidelines Holding apixaban  INFECTIOUS A:   Urinary tract infection (recurrent - likely related to colovaginal fistula) Severe sepsis P:   Monitor temp, WBC count Micro and abx as above  ENDOCRINE A:   Type 2 diabetes P:   SSI - mod scale  NEUROLOGIC A:   H/O CVA, dementia, seizure D/O Very poor functional status @ baseline ICU/vent associated discomfort P:   RASS goal: 0, -1 PAD protocol - intermittent meds only Palliative Care consultation requested  FAMILY  Updates: son and several other family members, loved ones updated @ bedside in ED. Goals of care/EOL concepts introduced    CCM time: 60 mins The above time includes time spent in consultation with patient and/or family members and reviewing care plan on multidisciplinary rounds  Merton Border, MD PCCM service Mobile [phone] Pager [phone] 04/01/17 4:16 PM    04-01-17, 1:52 PM

## 2017-04-19 NOTE — H&P (Signed)
[name]    MR#:  [id number]  DATE OF BIRTH:  1941-11-03  DATE OF ADMISSION:  Apr 17, 2017  PRIMARY CARE PHYSICIAN: Kirk Ruths, MD   REQUESTING/REFERRING PHYSICIAN: Dr. Lenise Arena  CHIEF COMPLAINT:   Chief Complaint  Patient presents with  . Seizures    HISTORY OF PRESENT ILLNESS:  [name]  is a 76 y.o. female with a known history of Chronic atrial fibrillation, history of colovaginal fistula, seizures, dementia, previous history of CVA, GERD, recurrent urinary tract infections who presents to the hospital from a skilled nursing facility due to altered mental status. As per the son gives most of the history patient has been more lethargic and confused over the past few days and she usually gets like this when she gets a urinary tract infection. The nursing home was notified and patient was started on intramuscular Rocephin which she got for 3 days. Despite getting antibiotics for mental status continued to deteriorate and therefore she was sent to the ER for further evaluation. Patient presented to the ER was noted to be hypothermic, tachycardic, and noted to be in severe septic shock secondary to urinary tract infection. She was intubated for airway protection given her altered mental status and was started on vasopressors given her severe hypotension. Hospitalist services were contacted further treatment and evaluation.  PAST MEDICAL HISTORY:   Past Medical History:  Diagnosis Date  . A-fib ([place]) 02/14/2015  . Arthritis   . Atrial fibrillation ([place])   . Colovaginal fistula   . Complication of anesthesia    DIFFICULTY WAKING   . CVA (cerebral infarction) 02/14/2015  . Dementia   . GERD without esophagitis 02/14/2015  . Glucose intolerance (impaired glucose tolerance)   . Gout   . Hyperlipidemia   . Recurrent UTI   . Seizure ([place]) 02/14/2015    PAST SURGICAL HISTORY:   Past Surgical  History:  Procedure Laterality Date  . BUNIONECTOMY Right   . CHOLECYSTECTOMY    . Right total hip arthroplasty  1998  . Right total hip revision arthroplasty for a periprosthetic fracture  2007    SOCIAL HISTORY:   Social History  Substance Use Topics  . Smoking status: Never Smoker  . Smokeless tobacco: Never Used  . Alcohol use No    FAMILY HISTORY:   Family History  Problem Relation Age of Onset  . Atrial fibrillation Sister   . Heart disease Mother     DRUG ALLERGIES:  No Known Allergies  REVIEW OF SYSTEMS:   Review of Systems  Unable to perform ROS: Intubated    MEDICATIONS AT HOME:   Prior to Admission medications   Medication Sig Start Date End Date Taking? Authorizing Provider  acetaminophen (TYLENOL) 325 MG tablet Take 2 tablets (650 mg total) by mouth every 6 (six) hours as needed for mild pain (or Fever >/= 101). 11/05/16  Yes Hower, Aaron Mose, MD  apixaban (ELIQUIS) 5 MG TABS tablet Take 1 tablet (5 mg total) by mouth 2 (two) times daily. 04/01/15  Yes Reyne Dumas, MD  ascorbic acid (VITAMIN C) 500 MG tablet Take 500 mg by mouth 2 (two) times daily.    Yes [provider]  collagenase (SANTYL) ointment Apply 1 application topically every other day. To left heel   Yes [provider]  Cranberry-Vitamin C-Inulin (UTI-STAT) LIQD Take 20 mLs by mouth 2 (two) times daily.   Yes [provider]  dexamethasone (  DECADRON) 2 MG tablet Take 2 mg by mouth daily.   Yes [provider]  diltiazem (CARDIZEM CD) 180 MG 24 hr capsule Take 1 capsule (180 mg total) by mouth daily. 01/16/17  Yes Max Sane, MD  divalproex (DEPAKOTE SPRINKLE) 125 MG capsule Take 1 capsule (125 mg total) by mouth 2 (two) times daily. 01/23/17  Yes Max Sane, MD  esomeprazole (NEXIUM) 20 MG capsule Take 20 mg by mouth daily at 12 noon.   Yes [provider]  furosemide (LASIX) 20 MG tablet Take 20 mg by mouth daily.     Yes [provider]   guaifenesin (ROBITUSSIN) 100 MG/5ML syrup Take 100 mg by mouth 3 (three) times daily as needed for cough.   Yes [provider]  ketotifen (ZADITOR) 0.025 % ophthalmic solution Place 1 drop into both eyes daily as needed (for dry eyes).   Yes [provider]  levETIRAcetam (KEPPRA) 100 MG/ML solution Take 10 mLs (1,000 mg total) by mouth 2 (two) times daily. Take 10 ml by mouth two times a day for seizure 01/23/17  Yes Max Sane, MD  memantine (NAMENDA) 10 MG tablet Take 10 mg by mouth 2 (two) times daily.   Yes [provider]  mineral oil enema Place 1 enema rectally as needed for severe constipation.   Yes [provider]  nitrofurantoin (MACRODANTIN) 100 MG capsule Take 100 mg by mouth at bedtime.   Yes [provider]  polyethylene glycol (MIRALAX) packet Take 17 g by mouth daily. 04/01/15  Yes Reyne Dumas, MD  potassium chloride (KLOR-CON) 10 MEQ CR tablet Take 10 mEq by mouth daily.     Yes [provider]  QUEtiapine (SEROQUEL) 50 MG tablet Take 1 tablet (50 mg total) by mouth at bedtime. 04/01/15  Yes Reyne Dumas, MD  senna-docusate (SENOKOT S) 8.6-50 MG tablet Take 1 tablet by mouth daily. 10/23/16  Yes Veronese, [place], MD  Amino Acids-Protein Hydrolys (FEEDING SUPPLEMENT, PRO-STAT 64,) LIQD Take 30 mLs by mouth daily.    [provider]  donepezil (ARICEPT) 5 MG tablet Take 1 tablet (5 mg total) by mouth at bedtime. Patient not taking: Reported on 01/13/2017 04/01/15   Reyne Dumas, MD  feeding supplement, ENSURE ENLIVE, (ENSURE ENLIVE) LIQD Take 237 mLs by mouth 2 (two) times daily between meals. 11/05/16   Hower, Aaron Mose, MD      VITAL SIGNS:  Blood pressure 104/85, pulse (!) 35, temperature (!) 95.8 F (35.4 C), temperature source Rectal, resp. rate (!) 24, weight 105.2 kg (232 lb), SpO2 (!) 8 %.  PHYSICAL EXAMINATION:  Physical Exam  GENERAL:  76 y.o.-year-old patient lying in bed critically illl.  EYES:  Pupils equal, round, reactive to light. No scleral icterus. Extraocular muscles intact.  HEENT: Head atraumatic, normocephalic. Oropharynx and nasopharynx clear. No oropharyngeal erythema, moist oral mucosa. ET, OG tube in place.   NECK:  Supple, no jugular venous distention. No thyroid enlargement, no tenderness.  LUNGS: Normal breath sounds bilaterally, no wheezing, rales, rhonchi. No use of accessory muscles of respiration.  CARDIOVASCULAR: S1, S2 RRR, Tachycardic. No murmurs, rubs, gallops, clicks.  ABDOMEN: Soft, nontender, nondistended. Bowel sounds present. No organomegaly or mass.  EXTREMITIES: No pedal edema, cyanosis, or clubbing. + 2 pedal & radial pulses b/l.   NEUROLOGIC: Sedated & Intubated PSYCHIATRIC: Sedated & Intubated.  SKIN: No obvious rash, lesion, or ulcer.   LABORATORY PANEL:   CBC  Recent Labs Lab 2017/04/18 0952  WBC 21.5*  HGB 14.2  HCT 44.8  PLT 278   ------------------------------------------------------------------------------------------------------------------  Chemistries   Recent Labs Lab 04/17/17 0952  NA 140  K 4.4  CL 107  CO2 23  GLUCOSE 71  BUN 26*  CREATININE 1.03*  CALCIUM 9.1  AST 32  ALT 59*  ALKPHOS 95  BILITOT 1.4*   ------------------------------------------------------------------------------------------------------------------  Cardiac Enzymes  Recent Labs Lab 2017-04-17 0952  TROPONINI 0.31*   ------------------------------------------------------------------------------------------------------------------  RADIOLOGY:  Ct Head Wo Contrast  Result Date: 17-Apr-2017 CLINICAL DATA:  Recent seizure like activity and unresponsiveness EXAM: CT HEAD WITHOUT CONTRAST TECHNIQUE: Contiguous axial images were obtained from the base of the skull through the vertex without intravenous contrast. COMPARISON:  None. FINDINGS: Brain: There are changes consistent with prior right MCA infarct stable from a prior exam. No acute  hemorrhage, acute infarction or space-occupying mass lesion is seen. Mild atrophic changes are noted. Vascular: No hyperdense vessel or unexpected calcification. Skull: Normal. Negative for fracture or focal lesion. Sinuses/Orbits: No acute finding. Other: None. IMPRESSION: Old right MCA infarct. Atrophic changes without acute abnormality. Electronically Signed   By: Inez Catalina M.D.   On: 04/17/2017 12:45   Dg Chest Port 1 View  Addendum Date: 04-17-17   ADDENDUM REPORT: 2017-04-17 11:08 ADDENDUM: Error in initial report - incorrect ET tube position. Tip of endotracheal tube is near the carina, recommend withdrawal 1.5 cm. Electronically Signed   By: Lavonia [name] M.D.   On: 04-17-17 11:08   Result Date: 04/17/17 CLINICAL DATA:  Post intubation, stroke, atrial fibrillation, dementia EXAM: PORTABLE CHEST 1 VIEW COMPARISON:  Portable exam 1020 hours compared to 01/21/2017 FINDINGS: Tip of endotracheal tube projects over tracheal air column 3.9 cm above carina, slightly directed toward the RIGHT lateral wall. Patient rotated to the RIGHT. Enlargement of cardiac silhouette with pulmonary vascular congestion. Low lung volumes. External pacing leads present. Decreased lung volumes with bibasilar atelectasis. Upper lungs clear. Small LEFT pleural effusion. No pneumothorax. Curvilinear gas collection in LEFT upper quadrant, stomach distended by gas. IMPRESSION: Low lung volumes with bibasilar atelectasis. Enlargement of cardiac silhouette with pulmonary vascular congestion. Curvilinear gas collection in the LEFT upper quadrant could potentially represent gastric pneumatosis which is a nonspecific finding. Gaseous distention of stomach. Electronically Signed: By: Lavonia [name] M.D. On: 04-17-2017 10:58     IMPRESSION AND PLAN:   76 year old female with past medical history of dementia, seizures, recurrent urinary tract infection, history of colovaginal fistula, chronic atrial fibrillation who presented to the  hospital due to altered mental status and noted to be in septic shock secondary to urinary tract infection.   1. Altered mental status-metabolic encephalopathy secondary to sepsis. -CT head negative for acute pathology. Continue treatment lying sepsis with IV fluids, IV antibiotics, vasopressors. Follow mental status. Patient is presently intubated and sedated.  2. Septic shock-secondary to urinary tract infection. -Treat with aggressive IV fluids, patient has been started on IV Levophed, follow hemodynamics. Continue broad-spectrum IV antibiotics with vancomycin, Zosyn. Follow blood, urine cultures.  3. Urinary tract infection-this is source of patient's sepsis. Continue broad-spectrum IV antibiotics with Zosyn. -Patient presented with a chronic indwelling Foley and will change out Foley  4. Leukocytosis-secondary to sepsis and urinary tract infection. Follow with IV antibiotic therapy.  5. History of seizures-no acute seizure type activity. -Continue Depakote. Continue Keppra.  6. History of atrial fibrillation-patient is presently tachycardic secondary to the sepsis. She is severely hypotensive and therefore hold off on rate controlling meds. Cont. Eliquis.   7. Dementia-continue Namenda, Seroquel.  Patient's  prognosis is very poor given advanced comorbidities. We'll get palliative care consult to discuss goals of care. Patient's CODE STATUS was reversed by family as she was previously a DO NOT RESUSCITATE.  All the records are reviewed and case discussed with ED provider. Management plans discussed with the patient, family and they are in agreement.  CODE STATUS: Full code  TOTAL TIME TAKING CARE OF THIS PATIENT: 50 minutes.    Henreitta Leber M.D on 2017/04/06 at 1:35 PM  Between 7am to 6pm - Pager - [phone]  After 6pm go to [email or web address] - password [email or web address] [email or web address]  [facility]  Office  [phone]  CC: Primary care physician; Kirk Ruths,  MD

## 2017-04-19 NOTE — Progress Notes (Signed)
eLink Physician-Brief Progress Note Patient Name: [name] DOB: 09-18-1941 MRN: [id number]   Date of Service  Apr 13, 2017  HPI/Events of Note  I have had extensive discussions with family both sons   We discussed patients current circumstances and organ failures. We also discussed patient's prior wishes under circumstances such as this. Family has decided to NOT perform resuscitation if arrest but to continue current medical support for now.   eICU Interventions       Intervention Category Major Interventions: End of life / care limitation discussion  Raylene Miyamoto. 04/13/17, 5:57 PM

## 2017-04-19 NOTE — Progress Notes (Signed)
eLink Physician-Brief Progress Note Patient Name: [name] DOB: 1941/08/15 MRN: [id number]   Date of Service  04-21-2017  HPI/Events of Note  New pt admit note elink  Camera care, pt agitation noted, tachy On pressors,  Vag/ baldder fistula, with sepsis uroseptic shock Appears dementia, multiple med problems, Needs DNR likley Doubt heroic measures will improve outcome  No code sepsis called, will d/w PCCM at [facility] utility Fem line inplace, would dc when able, neck line futility? Cortisol get Lactic repeat Unable cvp with fem line, bolus empiric Last ph noted, increase MV   eICU Interventions       Intervention Category Evaluation Type: New Patient Evaluation  [name] 04-21-17, 5:09 PM

## 2017-04-19 NOTE — Death Summary Note (Signed)
DEATH SUMMARY   Patient Details  Name: [name] MRN: [id number] DOB: Oct 04, 1941  Admission/Discharge Information   Admit Date:  13-Apr-2017  Date of Death:  04/13/17  Time of Death:  19:28  Length of Stay: 0  Referring Physician: Kirk Ruths, MD   Reason(s) for Hospitalization  76 y.o. F SNF resident with multiple medical problems inc CAF, dementia, colovaginal fistula,recurrent UTIs.  She was  admitted with severe sepsis with shock. She was intubated in ED for AMS and vasopressors initiated for persistent shock.    Diagnoses  Preliminary cause of death:  Severe Septic shock secondary to colovaginal fistula and recurrent UTI Secondary Diagnoses (including complications and co-morbidities):  Active Problems:   Septic shock (HCC) Acute Kidney Injury Metabolic Acidosis Afib with RVR  Brief Hospital Course (including significant findings, care, treatment, and services provided and events leading to death)  [name] is a 76 y.o. year old female with a history of Chronic atrial fibrillation, history of colovaginal fistula, seizures, dementia, previous history of CVA, GERD, recurrent urinary tract infections who presents to the hospital from a skilled nursing facility due to altered mental status. Admitted via ED with increasing lethargy and confusion over the past few days. She was intubated in ED for AMS. She was admitted with septic shock due to UTI requiring vasopressors    Pertinent Labs and Studies  Significant Diagnostic Studies Ct Head Wo Contrast  Result Date: Apr 13, 2017 CLINICAL DATA:  Recent seizure like activity and unresponsiveness EXAM: CT HEAD WITHOUT CONTRAST TECHNIQUE: Contiguous axial images were obtained from the base of the skull through the vertex without intravenous contrast. COMPARISON:  None. FINDINGS: Brain: There are changes consistent with prior right MCA infarct stable from a prior exam. No acute hemorrhage, acute infarction or  space-occupying mass lesion is seen. Mild atrophic changes are noted. Vascular: No hyperdense vessel or unexpected calcification. Skull: Normal. Negative for fracture or focal lesion. Sinuses/Orbits: No acute finding. Other: None. IMPRESSION: Old right MCA infarct. Atrophic changes without acute abnormality. Electronically Signed   By: Inez Catalina M.D.   On: 04/13/17 12:45   Dg Chest Port 1 View  Addendum Date: Apr 13, 2017   ADDENDUM REPORT: 2017/04/13 11:08 ADDENDUM: Error in initial report - incorrect ET tube position. Tip of endotracheal tube is near the carina, recommend withdrawal 1.5 cm. Electronically Signed   By: Lavonia [name] M.D.   On: 13-Apr-2017 11:08   Result Date: Apr 13, 2017 CLINICAL DATA:  Post intubation, stroke, atrial fibrillation, dementia EXAM: PORTABLE CHEST 1 VIEW COMPARISON:  Portable exam 1020 hours compared to 01/21/2017 FINDINGS: Tip of endotracheal tube projects over tracheal air column 3.9 cm above carina, slightly directed toward the RIGHT lateral wall. Patient rotated to the RIGHT. Enlargement of cardiac silhouette with pulmonary vascular congestion. Low lung volumes. External pacing leads present. Decreased lung volumes with bibasilar atelectasis. Upper lungs clear. Small LEFT pleural effusion. No pneumothorax. Curvilinear gas collection in LEFT upper quadrant, stomach distended by gas. IMPRESSION: Low lung volumes with bibasilar atelectasis. Enlargement of cardiac silhouette with pulmonary vascular congestion. Curvilinear gas collection in the LEFT upper quadrant could potentially represent gastric pneumatosis which is a nonspecific finding. Gaseous distention of stomach. Electronically Signed: By: Lavonia [name] M.D. On: Apr 13, 2017 10:58    Microbiology No results found for this or any previous visit (from the past 240 hour(s)).  Lab Basic Metabolic Panel:  Recent Labs Lab 04-13-2017 0952  NA 140  K 4.4  CL 107  CO2 23  GLUCOSE 71  BUN 26*  CREATININE 1.03*  CALCIUM  9.1   Liver Function Tests:  Recent Labs Lab 03-31-17 0952  AST 32  ALT 59*  ALKPHOS 95  BILITOT 1.4*  PROT 6.4*  ALBUMIN 2.9*   No results for input(s): LIPASE, AMYLASE in the last 168 hours. No results for input(s): AMMONIA in the last 168 hours. CBC:  Recent Labs Lab 03/31/17 0952  WBC 21.5*  NEUTROABS 19.6*  HGB 14.2  HCT 44.8  MCV 98.4  PLT 278   Cardiac Enzymes:  Recent Labs Lab 03/31/2017 0952  TROPONINI 0.31*   Sepsis Labs:  Recent Labs Lab 31-Mar-2017 0952  WBC 21.5*  LATICACIDVEN 5.1*    Procedures/Operations  5/8 ET tube   Simrah Chatham S Safa Derner 03/31/17, 8:22 PM

## 2017-04-19 NOTE — ED Notes (Signed)
Pt cleaned of stool at this time, linen changed as well.

## 2017-04-19 NOTE — ED Notes (Signed)
Family at bedside. 

## 2017-04-19 NOTE — Progress Notes (Signed)
Pharmacy Antibiotic Note  [name] is a 76 y.o. female admitted on 04/12/2017 with sepsis.  Pharmacy has been consulted for Zosyn and vancomycin dosing. Zosyn changed to cefepime.  Plan: 1. Cefepime 2 g iv q 12 hours.  2. Vancomycin 1 gm IV x 1 followed in approximately 8 hours (stacked dosing) by vancomycin 1 gm IV Q18H, predicted trough 16 mcg/mL. Pharmacy will continue to follow and adjust as needed to maintain trough 15 to 20 mcg/mL.   Vd 48.9 L, Ke 0.046 hr-1, T1/2 15 hr  Weight: 232 lb (105.2 kg)  Temp (24hrs), Avg:95.7 F (35.4 C), Min:95.6 F (35.3 C), Max:95.8 F (35.4 C)   Recent Labs Lab 04/12/17 0952  WBC 21.5*  CREATININE 1.03*  LATICACIDVEN 5.1*    Estimated Creatinine Clearance: 50.6 mL/min (A) (by C-G formula based on SCr of 1.03 mg/dL (H)).    No Known Allergies  Thank you for allowing pharmacy to be a part of this patient's care.  Ulice Dash D, Pharm.D., BCPS Clinical Pharmacist 04/12/17 3:47 PM

## 2017-04-19 NOTE — Progress Notes (Signed)
  Amiodarone Drug - Drug Interaction Consult Note  Recommendations: Do not resume quetiapine while patient is on amiodarone due to major risk of QT prolongation. Caution is advised if amiodarone is taken along with donepezil as this will increase the risk of QT prolongation. Amiodarone may increase the serum concentration of apixaban.   Amiodarone is metabolized by the cytochrome P450 system and therefore has the potential to cause many drug interactions. Amiodarone has an average plasma half-life of 50 days (range 20 to 100 days).   There is potential for drug interactions to occur several weeks or months after stopping treatment and the onset of drug interactions may be slow after initiating amiodarone.   []  Statins: Increased risk of myopathy. Simvastatin- restrict dose to 20mg  daily. Other statins: counsel patients to report any muscle pain or weakness immediately.  [x]  Anticoagulants: Amiodarone can increase anticoagulant effect. Consider warfarin dose reduction. Patients should be monitored closely and the dose of anticoagulant altered accordingly, remembering that amiodarone levels take several weeks to stabilize.  []  Antiepileptics: Amiodarone can increase plasma concentration of phenytoin, the dose should be reduced. Note that small changes in phenytoin dose can result in large changes in levels. Monitor patient and counsel on signs of toxicity.  []  Beta blockers: increased risk of bradycardia, AV block and myocardial depression. Sotalol - avoid concomitant use.  [x]   Calcium channel blockers (diltiazem and verapamil): increased risk of bradycardia, AV block and myocardial depression.  []   Cyclosporine: Amiodarone increases levels of cyclosporine. Reduced dose of cyclosporine is recommended.  []  Digoxin dose should be halved when amiodarone is started.  []  Diuretics: increased risk of cardiotoxicity if hypokalemia occurs.  []  Oral hypoglycemic agents (glyburide, glipizide,  glimepiride): increased risk of hypoglycemia. Patient's glucose levels should be monitored closely when initiating amiodarone therapy.   [x]  Drugs that prolong the QT interval:  Torsades de pointes risk may be increased with concurrent use - avoid if possible.  Monitor QTc, also keep magnesium/potassium WNL if concurrent therapy can't be avoided. Marland Kitchen Antibiotics: e.g. fluoroquinolones, erythromycin. . Antiarrhythmics: e.g. quinidine, procainamide, disopyramide, sotalol. . Antipsychotics: e.g. phenothiazines, haloperidol.  . Lithium, tricyclic antidepressants, and methadone. Thank You,  Napoleon Form  Apr 14, 2017 3:49 PM

## 2017-04-19 NOTE — ED Notes (Signed)
RT on way to transport pt to CT scan.

## 2017-04-19 NOTE — ED Notes (Signed)
Family remains at bedside.

## 2017-04-19 NOTE — Progress Notes (Signed)
All lines removed, [facility].  Supervisor aware, charge nurse aware.

## 2017-04-19 NOTE — ED Provider Notes (Signed)
Ct head findings:  IMPRESSION: Old right MCA infarct.  Atrophic changes without acute abnormality.     [name], MD 2017/04/10 1248

## 2017-04-19 NOTE — ED Provider Notes (Addendum)
[facility] Emergency Department Provider Note       Time seen: ----------------------------------------- 9:57 AM on 06-Apr-2017 -----------------------------------------  L5 caveat: Review of systems and history is limited by unresponsive state.   I have reviewed the triage vital signs and the nursing notes.   HISTORY   Chief Complaint Seizures    HPI [name] is a 76 y.o. female who presents to the ED for possible seizure-like activity at the nursing home. Patient reportedly was unresponsive with respiratory distress in route by EMS. EMS placed an IO for IV access. It was unknown whether she was having seizures at the nursing home or not. Reportedly the son has revoked her DO NOT RESUSCITATE status. She arrives unresponsive to painful stimuli.   Past Medical History:  Diagnosis Date  . A-fib ([name]) 02/14/2015  . Arthritis   . Atrial fibrillation ([place])   . Colovaginal fistula   . Complication of anesthesia    DIFFICULTY WAKING   . CVA (cerebral infarction) 02/14/2015  . Dementia   . GERD without esophagitis 02/14/2015  . Glucose intolerance (impaired glucose tolerance)   . Gout   . Hyperlipidemia   . Recurrent UTI   . Seizure ([name]) 02/14/2015    Patient Active Problem List   Diagnosis Date Noted  . Altered mental status 01/21/2017  . Palliative care encounter   . Goals of care, counseling/discussion   . DNR (do not resuscitate) discussion   . Sepsis ([place]) 10/31/2016  . Pressure injury of skin 10/31/2016  . Allergic state 10/05/2016  . Arthritis 10/05/2016  . Glucose intolerance (impaired glucose tolerance) 10/05/2016  . History of gout 10/05/2016  . Hyperlipidemia, unspecified 10/05/2016  . Stroke ([name]) 10/05/2016  . Delirium due to another medical condition 07/09/2016  . 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 Fracture, tibial plateau 04/01/2015  . Fall   . Knee fracture, left 03/29/2015  . UTI (urinary tract infection) 03/29/2015  .  Mental status, decreased 02/14/2015  . A-fib ([name]) 02/14/2015  . Cerebral infarction ([name]) 02/14/2015  . GERD (gastroesophageal reflux disease) 02/14/2015  . Seizure ([name]) 02/14/2015  . Dementia 02/14/2015  . GERD without esophagitis 02/14/2015  . History of CVA (cerebrovascular accident)   . Long term current use of anticoagulant therapy 08/08/2011    Past Surgical History:  Procedure Laterality Date  . BUNIONECTOMY Right   . CHOLECYSTECTOMY    . Right total hip arthroplasty  1998  . Right total hip revision arthroplasty for a periprosthetic fracture  2007    Allergies Patient has no known allergies.  Social History Social History  Substance Use Topics  . Smoking status: Never Smoker  . Smokeless tobacco: Never Used  . Alcohol use No    Review of Systems Unknown at this time, positive for altered mental status  All systems negative/normal/unremarkable except as stated in the HPI  ____________________________________________   PHYSICAL EXAM:  VITAL SIGNS: ED Triage Vitals [Apr 06, 2017 0949]  Enc Vitals Group     BP      Pulse Rate (!) 102     Resp 12     Temp      Temp src      SpO2 (!) 52 %     Weight 232 lb (105.2 kg)     Height      Head Circumference      Peak Flow      Pain Score      Pain Loc      Pain Edu?  Excl. in [place]?     Constitutional: Unresponsive, critically ill-appearing Eyes: Conjunctivae are normal. Right-sided gaze preference is noted. Bilateral pupillary defects are noted ENT   Head: Normocephalic and atraumatic.   Nose: No congestion/rhinnorhea.   Mouth/Throat: Mucous membranes are moist.   Neck: No stridor. Cardiovascular: Normal rate, regular rhythm. No murmurs, rubs, or gallops. Respiratory: Tachypnea with labored breathing noted. Gastrointestinal: Soft and nontender.  Musculoskeletal: No lower extremity edema Neurologic:  GCS is 6 on arrival as her eyes are open spontaneously but she has no verbal or motor  response Skin:  Skin is warm, dry and intact. No rash noted. Psychiatric: Patient appears to be in distress, cannot assess ____________________________________________  EKG: Interpreted by me. Atrial fibrillation with rapid ventricular response, rate is 120 bpm, normal QRS size, long QT, possible inferior infarct that is old  ____________________________________________  ED COURSE:  Pertinent labs & imaging results that were available during my care of the patient were reviewed by me and considered in my medical decision making (see chart for details). Patient presents for altered mental status, we will assess with labs and imaging as indicated.   Procedure Name: Intubation Date/Time: 2017-04-10 10:01 AM Performed by: [name] Pre-anesthesia Checklist: Emergency Drugs available Preoxygenation: Pre-oxygenation with 100% oxygen Ventilation: Mask ventilation without difficulty Laryngoscope Size: Mac and 4 Tube size: 8.0 mm Number of attempts: 1 Airway Equipment and Method: Video-laryngoscopy Placement Confirmation: ETT inserted through vocal cords under direct vision Secured at: 23 cm Tube secured with: ETT holder Dental Injury: Teeth and Oropharynx as per pre-operative assessment  Difficulty Due To: Difficulty was unanticipated     .Central Line Date/Time: 04/10/2017 11:20 AM Performed by: [name] Authorized by: Lenise Arena E   Consent:    Consent obtained:  Emergent situation Pre-procedure details:    Skin preparation:  2% chlorhexidine Procedure details:    Location:  R femoral   Patient position:  Trendelenburg   Procedural supplies:  Triple lumen   Landmarks identified: yes     Ultrasound guidance: yes     Number of attempts:  2   Successful placement: yes   Post-procedure details:    Post-procedure:  Dressing applied and line sutured   Assessment:  Blood return through all ports   Patient tolerance of procedure:  Tolerated well, no  immediate complications   ____________________________________________   LABS (pertinent positives/negatives)  Labs Reviewed  LACTIC ACID, PLASMA - Abnormal; Notable for the following:       Result Value   Lactic Acid, Venous 5.1 (*)    All other components within normal limits  COMPREHENSIVE METABOLIC PANEL - Abnormal; Notable for the following:    BUN 26 (*)    Creatinine, Ser 1.03 (*)    Total Protein 6.4 (*)    Albumin 2.9 (*)    ALT 59 (*)    Total Bilirubin 1.4 (*)    GFR calc non Af Amer 51 (*)    GFR calc Af Amer 59 (*)    All other components within normal limits  TROPONIN I - Abnormal; Notable for the following:    Troponin I 0.31 (*)    All other components within normal limits  CBC WITH DIFFERENTIAL/PLATELET - Abnormal; Notable for the following:    WBC 21.5 (*)    MCHC 31.7 (*)    RDW 16.7 (*)    Neutro Abs 19.6 (*)    All other components within normal limits  PROTIME-INR - Abnormal; Notable for the following:  Prothrombin Time 17.4 (*)    All other components within normal limits  URINALYSIS, COMPLETE (UACMP) WITH MICROSCOPIC - Abnormal; Notable for the following:    Color, Urine RED (*)    APPearance TURBID (*)    Glucose, UA   (*)    Value: TEST NOT REPORTED DUE TO COLOR INTERFERENCE OF URINE PIGMENT   Hgb urine dipstick   (*)    Value: TEST NOT REPORTED DUE TO COLOR INTERFERENCE OF URINE PIGMENT   Bilirubin Urine   (*)    Value: TEST NOT REPORTED DUE TO COLOR INTERFERENCE OF URINE PIGMENT   Ketones, ur   (*)    Value: TEST NOT REPORTED DUE TO COLOR INTERFERENCE OF URINE PIGMENT   Protein, ur   (*)    Value: TEST NOT REPORTED DUE TO COLOR INTERFERENCE OF URINE PIGMENT   Nitrite   (*)    Value: TEST NOT REPORTED DUE TO COLOR INTERFERENCE OF URINE PIGMENT   Leukocytes, UA   (*)    Value: TEST NOT REPORTED DUE TO COLOR INTERFERENCE OF URINE PIGMENT   Bacteria, UA MANY (*)    All other components within normal limits  BLOOD GAS, ARTERIAL - Abnormal;  Notable for the following:    pH, Arterial 7.33 (*)    pCO2 arterial 26 (*)    pO2, Arterial 332 (*)    Bicarbonate 13.7 (*)    Acid-base deficit 10.5 (*)    Allens test (pass/fail) POSITIVE (*)    All other components within normal limits  URINE CULTURE  CULTURE, BLOOD (SINGLE)  LACTIC ACID, PLASMA   CRITICAL CARE Performed by: [name]   Total critical care time: 30 minutes  Critical care time was exclusive of separately billable procedures and treating other patients.  Critical care was necessary to treat or prevent imminent or life-threatening deterioration.  Critical care was time spent personally by me on the following activities: development of treatment plan with patient and/or surrogate as well as nursing, discussions with consultants, evaluation of patient's response to treatment, examination of patient, obtaining history from patient or surrogate, ordering and performing treatments and interventions, ordering and review of laboratory studies, ordering and review of radiographic studies, pulse oximetry and re-evaluation of patient's condition.  RADIOLOGY Images were viewed by me  CT head, chest x-ray ADDENDUM: Error in initial report - incorrect ET tube position.  Tip of endotracheal tube is near the carina, recommend withdrawal 1.5 cm.  ____________________________________________  FINAL ASSESSMENT AND PLAN  Altered mental status, severe sepsis  Plan: Patient's labs and imaging were dictated above. Patient had presented for altered mental status and possible seizures. Patient is found to be likely septic with a source being rubble UTI from indwelling Foley catheter. She had been receiving Rocephin as an outpatient but this apparently has not covered her doctor on infection. We have ordered broad-spectrum antibiotics, she was intubated and a central line was placed. She was placed on pressors for blood pressure support. Patient will require ICU  admission with a grave prognosis.   [name], MD   Note: This note was generated in part or whole with voice recognition software. Voice recognition is usually quite accurate but there are transcription errors that can and very often do occur. I apologize for any typographical errors that were not detected and corrected.     [name], MD 13-Apr-2017 1123    [name], MD 04-13-2017 [phone]

## 2017-04-19 NOTE — Progress Notes (Signed)
Pt arrived to CCU from ED on Levophed, unable to get accurate BP. Md called to place arterial line. Line placed in left femoral. BP low, Levophed increased to max dose and vasopressin started. Pt began to brady down to 50's and BP decreased. Code blue called for additional support to bedside in preporation of pulseless event. 2 amps of bicarb given, D50, calcium chloride, and atropine given. Pts HR increased and BP improved. Sodium Bicarb drip started and amiodarone for continuous Afib. Family in hallways very upset, Chaplain called and MD talked to them regarding code status and current condition. Family agreed to make pt a DNR. Pt remained on maxed doses of Levophed and vasopressin until going pulseless around 1930. Family at bedside, chaplain called and support give.

## 2017-04-19 NOTE — ED Notes (Signed)
MD preparing to intubate now.

## 2017-04-19 NOTE — ED Notes (Signed)
Pt intubated per dr Jimmye Norman, 8.0 at 23 at the lip.  Pt being bagged at this time by RT.

## 2017-04-19 NOTE — ED Triage Notes (Addendum)
Pt to ed post seizure activity at nursing home. IO in place per ems in left lower leg.

## 2017-04-19 NOTE — Progress Notes (Signed)
Extubated and removed pt form ventilator at 1947. Rn and family at bedside.

## 2017-04-19 NOTE — ED Notes (Signed)
Family at bedside at this time

## 2017-04-19 NOTE — ED Provider Notes (Signed)
CODE BLUE paged overhead and I responded to find the patient already intubated in the CCU. When I arrived they and screen intensivist was running the code and the patient had actually never lost pulses. No intervention was ever required from my end.   Darel Hong, MD Apr 03, 2017 1747

## 2017-04-19 NOTE — Progress Notes (Signed)
Pharmacy Antibiotic Note  [name] is a 76 y.o. female admitted on 2017-04-05 with sepsis.  Pharmacy has been consulted for Zosyn and vancomycin dosing.  Plan: 1. Zosyn 3.375 gm IV Q8H EI 2. Vancomycin 1 gm IV x 1 followed in approximately 8 hours (stacked dosing) by vancomycin 1 gm IV Q18H, predicted trough 16 mcg/mL. Pharmacy will continue to follow and adjust as needed to maintain trough 15 to 20 mcg/mL.   Vd 48.9 L, Ke 0.046 hr-1, T1/2 15 hr  Weight: 232 lb (105.2 kg)  Temp (24hrs), Avg:95.8 F (35.4 C), Min:95.8 F (35.4 C), Max:95.8 F (35.4 C)   Recent Labs Lab Apr 05, 2017 0952  WBC 21.5*  CREATININE 1.03*  LATICACIDVEN 5.1*    Estimated Creatinine Clearance: 50.6 mL/min (A) (by C-G formula based on SCr of 1.03 mg/dL (H)).    No Known Allergies  Thank you for allowing pharmacy to be a part of this patient's care.  Laural Benes, Pharm.D., BCPS Clinical Pharmacist 04-05-2017 1:47 PM

## 2017-04-19 NOTE — Progress Notes (Signed)
[place] responded to a Code Blue in IC-16. Pt was being attended to by the Medical Team. [facility] assisted with Son who was overcome with grief. [place] addressed family who are spiritually strong. [facility] offered prayer. Pt is somewhat stable and Sons are bedside. Large number of family in the [place] waiting area. [place] will inform the On-Call [facility] for follow up.      Apr 05, 2017 1800  Clinical Encounter Type  Visited With Patient;Patient and family together;Health care provider  Visit Type Initial;Spiritual support;Code;Critical Care (Code Blue)  Referral From Nurse  Spiritual Encounters  Spiritual Needs Prayer;Emotional

## 2017-04-19 DEATH — deceased
